# Patient Record
Sex: Male | Born: 1950 | Race: White | Hispanic: No | Smoking: Former smoker
Health system: Southern US, Community
[De-identification: ages and names within clinical notes are randomized; demographics above are authoritative.]

## PROBLEM LIST (undated history)

## (undated) DIAGNOSIS — N400 Enlarged prostate without lower urinary tract symptoms: Secondary | ICD-10-CM

## (undated) DIAGNOSIS — IMO0001 Reserved for inherently not codable concepts without codable children: Secondary | ICD-10-CM

## (undated) DIAGNOSIS — Z9221 Personal history of antineoplastic chemotherapy: Secondary | ICD-10-CM

## (undated) DIAGNOSIS — I10 Essential (primary) hypertension: Secondary | ICD-10-CM

## (undated) DIAGNOSIS — C300 Malignant neoplasm of nasal cavity: Secondary | ICD-10-CM

## (undated) DIAGNOSIS — E871 Hypo-osmolality and hyponatremia: Secondary | ICD-10-CM

## (undated) DIAGNOSIS — R1310 Dysphagia, unspecified: Secondary | ICD-10-CM

## (undated) DIAGNOSIS — C4490 Unspecified malignant neoplasm of skin, unspecified: Secondary | ICD-10-CM

## (undated) DIAGNOSIS — E43 Unspecified severe protein-calorie malnutrition: Secondary | ICD-10-CM

## (undated) DIAGNOSIS — C029 Malignant neoplasm of tongue, unspecified: Secondary | ICD-10-CM

## (undated) DIAGNOSIS — IMO0002 Reserved for concepts with insufficient information to code with codable children: Secondary | ICD-10-CM

## (undated) HISTORY — DX: Unspecified severe protein-calorie malnutrition: E43

## (undated) HISTORY — PX: SKIN BIOPSY: SHX1

## (undated) HISTORY — DX: Hypo-osmolality and hyponatremia: E87.1

## (undated) HISTORY — DX: Benign prostatic hyperplasia without lower urinary tract symptoms: N40.0

## (undated) HISTORY — DX: Dysphagia, unspecified: R13.10

## (undated) HISTORY — PX: OTHER SURGICAL HISTORY: SHX169

## (undated) HISTORY — DX: Malignant neoplasm of tongue, unspecified: C02.9

## (undated) HISTORY — DX: Malignant neoplasm of nasal cavity: C30.0

## (undated) HISTORY — PX: KNEE SURGERY: SHX244

---

## 2005-02-16 ENCOUNTER — Emergency Department: Payer: Self-pay | Admitting: Emergency Medicine

## 2006-04-05 ENCOUNTER — Emergency Department: Payer: Self-pay | Admitting: Emergency Medicine

## 2006-12-26 ENCOUNTER — Emergency Department: Payer: Self-pay | Admitting: Emergency Medicine

## 2007-01-17 ENCOUNTER — Ambulatory Visit: Payer: Self-pay | Admitting: Unknown Physician Specialty

## 2009-11-04 ENCOUNTER — Inpatient Hospital Stay: Payer: Self-pay | Admitting: Internal Medicine

## 2010-10-21 ENCOUNTER — Inpatient Hospital Stay (HOSPITAL_COMMUNITY)
Admission: EM | Admit: 2010-10-21 | Discharge: 2010-10-26 | DRG: 191 | Disposition: A | Discharge status: Home Health | Payer: Medicaid Other | Attending: Internal Medicine | Admitting: Internal Medicine

## 2010-10-21 DIAGNOSIS — F1020 Alcohol dependence, uncomplicated: Secondary | ICD-10-CM | POA: Diagnosis present

## 2010-10-21 DIAGNOSIS — F10939 Alcohol use, unspecified with withdrawal, unspecified: Secondary | ICD-10-CM | POA: Diagnosis present

## 2010-10-21 DIAGNOSIS — I10 Essential (primary) hypertension: Secondary | ICD-10-CM | POA: Diagnosis present

## 2010-10-21 DIAGNOSIS — B86 Scabies: Secondary | ICD-10-CM | POA: Diagnosis present

## 2010-10-21 DIAGNOSIS — J441 Chronic obstructive pulmonary disease with (acute) exacerbation: Principal | ICD-10-CM | POA: Diagnosis present

## 2010-10-21 DIAGNOSIS — E871 Hypo-osmolality and hyponatremia: Secondary | ICD-10-CM | POA: Diagnosis present

## 2010-10-21 DIAGNOSIS — E876 Hypokalemia: Secondary | ICD-10-CM | POA: Diagnosis not present

## 2010-10-21 DIAGNOSIS — F10239 Alcohol dependence with withdrawal, unspecified: Secondary | ICD-10-CM | POA: Diagnosis present

## 2010-10-22 ENCOUNTER — Emergency Department (HOSPITAL_COMMUNITY): Payer: Medicaid Other

## 2010-10-22 LAB — BASIC METABOLIC PANEL WITH GFR
BUN: 2 mg/dL — ABNORMAL LOW (ref 6–23)
CO2: 27 meq/L (ref 19–32)
Calcium: 8.8 mg/dL (ref 8.4–10.5)
Chloride: 92 meq/L — ABNORMAL LOW (ref 96–112)
Creatinine, Ser: 0.73 mg/dL (ref 0.4–1.5)
GFR calc non Af Amer: >60 mL/min
Glucose, Bld: 93 mg/dL (ref 70–99)
Potassium: 3.9 meq/L (ref 3.5–5.1)
Sodium: 130 meq/L — ABNORMAL LOW (ref 135–145)

## 2010-10-22 LAB — DIFFERENTIAL
Basophils Absolute: 0.1 K/uL (ref 0.0–0.1)
Basophils Relative: 1 % (ref 0–1)
Eosinophils Absolute: 0.8 K/uL — ABNORMAL HIGH (ref 0.0–0.7)
Eosinophils Relative: 12 % — ABNORMAL HIGH (ref 0–5)
Lymphocytes Relative: 37 % (ref 12–46)
Lymphs Abs: 2.4 K/uL (ref 0.7–4.0)
Monocytes Absolute: 0.7 K/uL (ref 0.1–1.0)
Monocytes Relative: 10 % (ref 3–12)
Neutro Abs: 2.5 K/uL (ref 1.7–7.7)
Neutrophils Relative %: 40 % — ABNORMAL LOW (ref 43–77)

## 2010-10-22 LAB — RAPID URINE DRUG SCREEN, HOSP PERFORMED
Amphetamines: NOT DETECTED
Barbiturates: NOT DETECTED
Benzodiazepines: NOT DETECTED
Cocaine: NOT DETECTED
Opiates: NOT DETECTED
Tetrahydrocannabinol: NOT DETECTED

## 2010-10-22 LAB — HEPATIC FUNCTION PANEL
ALT: 209 U/L — ABNORMAL HIGH (ref 0–53)
AST: 181 U/L — ABNORMAL HIGH (ref 0–37)
Bilirubin, Direct: 0.1 mg/dL (ref 0.0–0.3)
Total Protein: 7.4 g/dL (ref 6.0–8.3)

## 2010-10-22 LAB — CBC
HCT: 47 % (ref 39.0–52.0)
MCHC: 36 g/dL (ref 30.0–36.0)
Platelets: 190 10*3/uL (ref 150–400)
RDW: 13.7 % (ref 11.5–15.5)

## 2010-10-22 LAB — ETHANOL

## 2010-10-23 LAB — COMPREHENSIVE METABOLIC PANEL
Alkaline Phosphatase: 98 U/L (ref 39–117)
BUN: 3 mg/dL — ABNORMAL LOW (ref 6–23)
CO2: 24 mEq/L (ref 19–32)
Chloride: 92 mEq/L — ABNORMAL LOW (ref 96–112)
Glucose, Bld: 173 mg/dL — ABNORMAL HIGH (ref 70–99)
Potassium: 3.4 mEq/L — ABNORMAL LOW (ref 3.5–5.1)
Total Bilirubin: 0.6 mg/dL (ref 0.3–1.2)

## 2010-10-23 LAB — CBC
HCT: 43.4 % (ref 39.0–52.0)
MCV: 88.6 fL (ref 78.0–100.0)
RBC: 4.9 MIL/uL (ref 4.22–5.81)
WBC: 3.6 10*3/uL — ABNORMAL LOW (ref 4.0–10.5)

## 2010-10-23 LAB — TSH: TSH: 0.459 u[IU]/mL (ref 0.350–4.500)

## 2010-10-24 ENCOUNTER — Inpatient Hospital Stay (HOSPITAL_COMMUNITY): Payer: Medicaid Other

## 2010-10-24 ENCOUNTER — Other Ambulatory Visit (HOSPITAL_COMMUNITY): Payer: Medicaid Other

## 2010-10-24 LAB — BASIC METABOLIC PANEL
BUN: 11 mg/dL (ref 6–23)
CO2: 24 mEq/L (ref 19–32)
Calcium: 8.8 mg/dL (ref 8.4–10.5)
Chloride: 93 mEq/L — ABNORMAL LOW (ref 96–112)
Creatinine, Ser: 0.63 mg/dL (ref 0.4–1.5)
Creatinine, Ser: 0.76 mg/dL (ref 0.4–1.5)
GFR calc Af Amer: >60 mL/min (ref >60)
GFR calc Af Amer: >60 mL/min (ref >60)
GFR calc non Af Amer: >60 mL/min (ref >60)
Potassium: 3.7 mEq/L (ref 3.5–5.1)

## 2010-10-24 LAB — HEPATITIS PANEL, ACUTE
Hep B C IgM: NEGATIVE
Hepatitis B Surface Ag: NEGATIVE

## 2010-10-24 LAB — CBC
MCH: 31 pg (ref 26.0–34.0)
Platelets: 187 10*3/uL (ref 150–400)
RBC: 4.81 MIL/uL (ref 4.22–5.81)
RDW: 13.4 % (ref 11.5–15.5)
WBC: 5.3 10*3/uL (ref 4.0–10.5)

## 2010-10-24 LAB — DIFFERENTIAL
Basophils Relative: 0 % (ref 0–1)
Eosinophils Absolute: 0 10*3/uL (ref 0.0–0.7)
Neutrophils Relative %: 88 % — ABNORMAL HIGH (ref 43–77)

## 2010-10-25 ENCOUNTER — Inpatient Hospital Stay (HOSPITAL_COMMUNITY): Payer: Medicaid Other

## 2010-10-25 ENCOUNTER — Encounter (HOSPITAL_COMMUNITY): Payer: Self-pay | Admitting: Radiology

## 2010-10-25 LAB — CBC
MCH: 30.9 pg (ref 26.0–34.0)
MCHC: 34.7 g/dL (ref 30.0–36.0)
Platelets: 201 10*3/uL (ref 150–400)
RDW: 13.4 % (ref 11.5–15.5)

## 2010-10-25 LAB — COMPREHENSIVE METABOLIC PANEL
ALT: 118 U/L — ABNORMAL HIGH (ref 0–53)
AST: 61 U/L — ABNORMAL HIGH (ref 0–37)
CO2: 24 mEq/L (ref 19–32)
Chloride: 94 mEq/L — ABNORMAL LOW (ref 96–112)
Creatinine, Ser: 0.75 mg/dL (ref 0.4–1.5)
GFR calc Af Amer: >60 mL/min (ref >60)
GFR calc non Af Amer: >60 mL/min (ref >60)
Glucose, Bld: 126 mg/dL — ABNORMAL HIGH (ref 70–99)
Total Bilirubin: 1 mg/dL (ref 0.3–1.2)

## 2010-10-25 LAB — DIFFERENTIAL
Basophils Absolute: 0 10*3/uL (ref 0.0–0.1)
Basophils Relative: 0 % (ref 0–1)
Eosinophils Absolute: 0 10*3/uL (ref 0.0–0.7)
Eosinophils Relative: 0 % (ref 0–5)
Monocytes Absolute: 0.6 10*3/uL (ref 0.1–1.0)
Monocytes Relative: 10 % (ref 3–12)
Neutro Abs: 5.2 10*3/uL (ref 1.7–7.7)

## 2010-10-25 LAB — AMYLASE: Amylase: 40 U/L (ref 0–105)

## 2010-10-25 LAB — CARDIAC PANEL(CRET KIN+CKTOT+MB+TROPI): Relative Index: INVALID (ref 0.0–2.5)

## 2010-10-25 LAB — LIPASE, BLOOD: Lipase: 29 U/L (ref 11–59)

## 2010-10-25 MED ORDER — IOHEXOL 300 MG/ML  SOLN
100.0000 mL | Freq: Once | INTRAMUSCULAR | Status: AC | PRN
  Administered 2010-10-25: 100 mL via INTRAVENOUS

## 2010-10-26 DIAGNOSIS — R1013 Epigastric pain: Secondary | ICD-10-CM

## 2010-10-26 DIAGNOSIS — R110 Nausea: Secondary | ICD-10-CM

## 2010-10-26 LAB — CARDIAC PANEL(CRET KIN+CKTOT+MB+TROPI)
CK, MB: 0.6 ng/mL (ref 0.3–4.0)
CK, MB: 0.7 ng/mL (ref 0.3–4.0)
Relative Index: INVALID (ref 0.0–2.5)
Total CK: 21 U/L (ref 7–232)
Total CK: 21 U/L (ref 7–232)
Troponin I: 0.01 ng/mL (ref 0.00–0.06)

## 2010-10-26 LAB — DIFFERENTIAL
Basophils Absolute: 0 10*3/uL (ref 0.0–0.1)
Basophils Relative: 0 % (ref 0–1)
Neutro Abs: 5.5 10*3/uL (ref 1.7–7.7)
Neutrophils Relative %: 81 % — ABNORMAL HIGH (ref 43–77)

## 2010-10-26 LAB — OSMOLALITY: Osmolality: 262 mOsm/kg — ABNORMAL LOW (ref 275–300)

## 2010-10-26 LAB — CBC
Hemoglobin: 15.1 g/dL (ref 13.0–17.0)
RBC: 4.91 MIL/uL (ref 4.22–5.81)
WBC: 6.7 10*3/uL (ref 4.0–10.5)

## 2010-10-26 LAB — BASIC METABOLIC PANEL
Chloride: 97 mEq/L (ref 96–112)
GFR calc Af Amer: >60 mL/min (ref >60)
Potassium: 4 mEq/L (ref 3.5–5.1)
Sodium: 127 mEq/L — ABNORMAL LOW (ref 135–145)

## 2010-10-26 LAB — LACTIC ACID, PLASMA: Lactic Acid, Venous: 1.7 mmol/L (ref 0.5–2.2)

## 2010-10-26 LAB — HEPATIC FUNCTION PANEL
ALT: 120 U/L — ABNORMAL HIGH (ref 0–53)
AST: 72 U/L — ABNORMAL HIGH (ref 0–37)
Albumin: 3.1 g/dL — ABNORMAL LOW (ref 3.5–5.2)
Alkaline Phosphatase: 79 U/L (ref 39–117)
Total Protein: 5.9 g/dL — ABNORMAL LOW (ref 6.0–8.3)

## 2010-10-27 ENCOUNTER — Ambulatory Visit (HOSPITAL_COMMUNITY): Admission: RE | Admit: 2010-10-27 | Payer: Medicaid Other | Source: Ambulatory Visit | Admitting: Internal Medicine

## 2010-10-27 NOTE — Discharge Summary (Signed)
NAME:  Thomas [name], Thomas [name]                 ACCOUNT NO.:  [id number]  MEDICAL RECORD NO.:  [id number]           PATIENT TYPE:  I  LOCATION:  A213                          FACILITY:  [facility]  PHYSICIAN:  Wilson Singer, M.D.DATE OF BIRTH:  01-08-1951  DATE OF ADMISSION:  10/21/2010 DATE OF DISCHARGE:  03/21/2012LH                              DISCHARGE SUMMARY   FINAL DISCHARGE DIAGNOSES: 1. Alcohol withdrawal. 2. Alcohol dependence. 3. Chronic obstructive pulmonary disease with exacerbation. 4. Hypertension.  MEDICATIONS ON DISCHARGE: 1. Clonazepam 1 mg t.i.d. p.r.n. 2. Colace 100 mg b.i.d. p.r.n. 3. Folic acid 1 mg daily. 4. Metoprolol 50 mg b.i.d. 5. Avelox 400 mg daily for 7 days. 6. Multivitamins 1 tablet daily. 7. Nicotine patch 21 mg per day daily. 8. Prednisone 20 mg daily for 1 week and then stop. 9. Protonix 40 mg daily. 10.Spiriva inhaler 18 mcg 1 inhalation daily. 11.Thiamine 100 mg daily. 12.Effexor 150 mg b.i.d. CONDITION ON DISCHARGE:  Stable.  HISTORY:  This 60 year old man was admitted with dyspnea and history of alcohol abuse.  Dyspnea was associated with coughing and productive sputum.  Please see initial history and physical examination done by Dr. Eda Paschal.  HOSPITAL PROGRESS:  The patient was felt to have an exacerbation of COPD based on longstanding smoking history.  He also is alcoholic and he requested help with this.  He underwent CT scan of his abdomen in view of abdominal pain, nausea and vomiting.  The CT scan showed a fatty liver and some gaseous distention of the ascending, transverse colon without evidence of obstruction lesion.  There was inability to see the rectal wall where there was a suggestion of thickening but this may need to be investigated further with colonoscopy.  He has been seen by Gastroenterology who feel that an outpatient colonoscopy and possibly even EGD may be appropriate.  The patient seemed to improve during  his hospitalization.  The patient also had some sort of possibly scabies rash which is improved and this had been present for 4 months.  The patient had been rather noncompliant with medications and he was meant to be on several medications and he has taken 9 in the last few months.  PHYSICAL EXAMINATION:  Today; GENERAL:  He actually feels well.  He was put on alcohol withdrawal protocol and this seems to have helped him.  He is not particularly shaky.  He does complain of cough productive brownish sputum. VITAL SIGNS:  Temperature 97.4, blood pressure 166/94, pulse 80, saturation 93% on room air. CARDIAC:  Heart sounds are present and normal. LUNGS:  Lung fields are clear with occasional wheezing bilaterally. ABDOMEN:  Soft and nontender with no hepatosplenomegaly.  In particular, there are no signs of chronic liver disease.  Investigations today show a sodium of 127 and this is stable, potassium 4.0, bicarbonate 23, BUN 12, creatinine 0.71, his albumin is 3.1.  His ALT is rated at 120 and AST at 72 and this is stable, also alk phos is in the normal range of 79.  Hepatitis A, B and C panel were done and this was negative.  A  lipase was not elevated nor was a lactic acid. When he came into the hospital, his alcohol level was 295.  DISPOSITION:  I am prescribing him several medications as noted above to hopefully keep him out of the hospital.  He unfortunately does not qualify at this moment in time for a rehab place for alcoholism due to his inability to walk upstairs.  Therefore, he is going to go home with home health physical therapy and aide and also social worker help.  I have told him to follow up with primary care physician and also gastroenterologist seen him during this hospitalization and he needs to follow up with them with a view to a possible colonoscopy.     Wilson Singer, M.D.     NCG/MEDQ  D:  10/26/2010  T:  10/26/2010  Job:  [id number]  cc:   R. Roetta Sessions, M.D. [address] [place] [address]  Electronically Signed by Lilly Cove M.D. on 10/27/2010 05:07:27 PM

## 2010-11-02 ENCOUNTER — Emergency Department (HOSPITAL_COMMUNITY): Payer: Medicaid Other

## 2010-11-02 ENCOUNTER — Emergency Department (HOSPITAL_COMMUNITY)
Admission: EM | Admit: 2010-11-02 | Discharge: 2010-11-02 | Disposition: A | Discharge status: Home / Self Care | Payer: Medicaid Other | Attending: Emergency Medicine | Admitting: Emergency Medicine

## 2010-11-02 DIAGNOSIS — I10 Essential (primary) hypertension: Secondary | ICD-10-CM | POA: Insufficient documentation

## 2010-11-02 DIAGNOSIS — J4489 Other specified chronic obstructive pulmonary disease: Secondary | ICD-10-CM | POA: Insufficient documentation

## 2010-11-02 DIAGNOSIS — M722 Plantar fascial fibromatosis: Secondary | ICD-10-CM | POA: Insufficient documentation

## 2010-11-02 DIAGNOSIS — B86 Scabies: Secondary | ICD-10-CM | POA: Insufficient documentation

## 2010-11-02 DIAGNOSIS — M79609 Pain in unspecified limb: Secondary | ICD-10-CM | POA: Insufficient documentation

## 2010-11-02 DIAGNOSIS — J449 Chronic obstructive pulmonary disease, unspecified: Secondary | ICD-10-CM | POA: Insufficient documentation

## 2010-11-02 DIAGNOSIS — Z79899 Other long term (current) drug therapy: Secondary | ICD-10-CM | POA: Insufficient documentation

## 2010-11-03 ENCOUNTER — Inpatient Hospital Stay: Payer: Medicaid Other | Admitting: Gastroenterology

## 2010-11-25 NOTE — H&P (Signed)
NAME:  Thomas [name], Thomas [name] NO.:  [id number]  MEDICAL RECORD NO.:  [id number]           PATIENT TYPE:  LOCATION:                                 FACILITY:  PHYSICIAN:  Arlon Bleier Bosie Helper, MD      DATE OF BIRTH:  1951/02/23  DATE OF ADMISSION: DATE OF DISCHARGE:  LH                             HISTORY & PHYSICAL   PRIMARY CARE PROVIDER:  Unassigned.  CHIEF COMPLAINT:  Abnormal rash for the last 4 months and needs help with alcohol abuse and shortness of breath with coughing.  HISTORY OF PRESENT ILLNESS:  This is a 61 year old Caucasian gentleman with history of ongoing tobacco abuse and history of alcohol abuse.  He admitted he recently was [facility] from the prison on August 04, 2010.  He complained of abnormal rash, which started on his lower extremity and extended to his upper trunk and then back, also extended to his upper extremities.  It is extremely itchy.  He was being treated with some medication at the prison, the patient cannot recall the name of the medication.  Also, the patient admitted he has continued to drink alcohol on daily basis, last drink was yesterday.  He would like to try quitting.  Also the patient is complaining of shortness of breath, but admitted he has baseline shortness of breath and sometimes coughing. Coughing is nonproductive.  The patient denies any fever.  The patient admitted he was brought by his daughter because of her concern about the patient's withdrawal  and drinking habit.  Also, daughter was concerned about the patient's shortness of breath.  The patient denies any fever. The patient denies any chest pain, denies any back pain, denies any headache, denies any numbness or weakness of his extremities.  The patient denies any nausea or vomiting.  Last bowel movement was yesterday.  The patient denies any blood in stool, denies any hematemesis.  MEDICATIONS:  The patient admitted using Klonopin, Effexor, nebs treatment,  Spiriva.  PAST SURGICAL HISTORY:  History of knee surgeries.  ALLERGIES:  No known drug allergies.  SOCIAL HISTORY:  The patient admitted he smoked cigarettes one and half pack a day for more than 50 years.  Also, he drinks bear about 24 case every day, last drink was yesterday.  He denies any other illicit drug abuse.  FAMILY HISTORY:  The patient admitted his father was alcoholic and a smoker.  He died with cancer complication.  PHYSICAL EXAMINATION:  VITAL SIGNS:  Temperature 98.4, blood pressure 178/97, pulse rate 95, respiratory rate 18, saturating 92% on room air. GENERAL:  The patient is wheezing, but not in respiratory distress. HEENT:  Pupils equal and reactive to light and accommodation. Extraocular muscle movement within normal.  Mucosa moist.  No rash.  No tonsillar hypertrophy. NECK:  Supple.  No lymphadenopathy.  No masses. LUNGS:  Bilateral rales.  Bilateral wheezes.  No bronchial breathing. HEART:  S1 and S2 with no added sound. ABDOMEN:  Soft, nontender.  Bowel sound positive.  Inguinal area, there is no lymphadenopathy.  No discharges. EXTREMITIES:  No lower leg edema.  Peripheral pulses  intact. SKIN:  There is a small erythematous, nondiscrete papule with some hemorrhagic crust.  There is a scratch mark on both flexor and extensor surface of both upper and lower extremities, also on his abdomen, a less benign on the back.  No ecchymoses.  Less amount on the plantar surface of his legs and palm. CENTRAL NERVOUS SYSTEM:  The patient is awake, alert, and oriented.  The patient is really actively shaking.  BLOOD WORKUP:  CBC 6.4, hemoglobin 16.9, hematocrit 47, platelet 190, alcohol level was 295.  BMET:  Sodium 139, potassium 3.9, chloride 92, CO2 27, glucose 93, BUN 2, creatinine 0.73, calcium 8.8, ALT 209, AST 181, alkaline phosphatase 113, magnesium 2.2, phosphorus 4.  Chest x-ray chronic changes.  ASSESSMENT AND PLAN: 1. Alcohol withdrawal with alcohol  dependence. 2. Skin rash, possibility of scabies as the patient was recently     discharged from the prison. 3. Chronic obstructive pulmonary disease with exacerbation. We will admit to regular floor.  We will start the patient on some Solu- Medrol 60 mg IV b.i.d. and Avelox 400 mg p.o. daily.  We will start the patient on albuterol nebs and Atrovent.  We will place the patient on contact isolation.  Permethrin 5% cream will be applied to the patient's skin.  We will provide the patient with Benadryl.  We will get hepatitis profile.  We will continue on alcohol withdrawal protocol.  We will involve Child psychotherapist.  Counseling will be addressed during hospital stay.  Further recommendation as hospital course progresses.     Guhan Bruington Bosie Helper, MD     HIE/MEDQ  D:  10/22/2010  T:  10/23/2010  Job:  [id number]  Electronically Signed by Ebony Cargo MD on 11/25/2010 05:29:08 AM

## 2010-11-27 NOTE — Consult Note (Signed)
NAME:  Thomas [name], Thomas [name] NO.:  [id number]  MEDICAL RECORD NO.:  [id number]           PATIENT TYPE:  I  LOCATION:  A213                          FACILITY:  [facility]  PHYSICIAN:  Gerrit Halls, ANP-BC      DATE OF BIRTH:  05-23-1951  DATE OF CONSULTATION:  10/26/2010 DATE OF DISCHARGE:                                CONSULTATION   PRIMARY CARE PHYSICIAN:  None.  GASTROENTEROLOGIST:  Jonathon Bellows, MD  REASON FOR CONSULTATION:  Abdominal pain.  HISTORY OF PRESENT ILLNESS:  Mr. Beutler is a 60 year old Caucasian male who was admitted with a chief complaint of an abnormal rash for the last 4 months as well as the need for assistance with alcohol abuse and shortness of breath.  He does have a history of COPD.  Mr. Andal was admitted on October 21, 2010 with concerns regarding a rash had been present for the past 4 months.  He has a history of alcohol abuse.  He was drinking upwards of 12+ beers a day intermittently quite his whole life.  His daughter actually presented with him concerned about the patient shortness of breath as well as his alcohol use and drinking habits.  He was admitted and while admitted, we were consulted for abdominal pain.  The patient reports that he has experienced nausea and epigastric pain for approximately 1 year, this was worse with eating.  It is intermittent in nature.  It is described as burning in 8/9.  He denies any dysphagia or odynophagia.  He denies any NSAIDs, PCs, or Goody's.  He does have reflux with taking Tums everyday.  He is not on a PPI currently.  He does complain of lower abdominal cramping; however, this is relieved after a bowel movement.  Prior to admission, he had a bowel movement approximately every other day.  This pain only lasts 2-3 minutes at a time.  He denies any melena or bright red blood per rectum.  His last colonoscopy and upper endoscopy was done approximately 4 years ago in [place].  We do not have these  records currently.  As of note, he was just discharged from prison on August 04, 2010.  He was incarcerated secondary to driving under the influence, he was there approximately 10 months.  PAST MEDICAL HISTORY:  Significant for COPD, alcohol abuse and depression as well as reflux.  PAST SURGICAL HISTORY:  Knee surgeries x3.  SOCIAL HISTORY:  Alcohol abuse.  He also smokes one pack per day.  He denies the use of any illicit drugs.  He has been married two times previously.  He is now divorced.  He has one daughter.  He was recently discharged from prison on August 04, 2009.  FAMILY HISTORY:  Emelia Loron actually has a history of colon cancer and was deceased at 89 secondary to this.  His father was deceased with some type of cancer as well as a history of alcohol abuse.  His mom is living and has COPD.  CURRENT ALLERGIES:  No known drug allergies.  MEDICATIONS PRIOR TO ADMISSION:  Klonopin, Effexor nebulizer treatments, Spiriva.  MEDICATIONS  FOR THIS ADMISSION:  Albuterol, Norvasc, aspirin, Dulcolax suppository, clonidine, Benadryl 300 mg a day, folic acid, Lasix, heparin, Atrovent, Ativan, Avelox, multivitamin, nicotine patch, Effexor.  REVIEW OF SYSTEMS:  Negative in detail except as mentioned in the history of present illness.  PHYSICAL EXAMINATION:  GENERAL:  He is in no apparent distress.  He does seem quite drowsy, but he is easily arousable.  He is somewhat of a poor historian. HEENT:  Sclerae without icterus.  No thyromegaly noted. CARDIAC:  Regular rate and rhythm. LUNGS:  Coarse bilaterally, but no use of accessory muscles.  No labored breathing. ABDOMEN:  Soft, nontender, nondistended.  Positive bowel sounds.  No rebound or guarding.  No splenomegaly noted. EXTREMITIES:  Without edema. SKIN:  Without any significant lesions.  LABORATORY DATA:  Pertinent blood work for this admission, most recent CBC with an H and H of 15.1 and 43.9, white blood count normal  at 6.7 and platelets are 202.  Basic metabolic panel with sodium 127, glucose of 150, potassium is normal at 4.  Hepatic function panel, T bili is 0.7 and direct bilirubin 0.2, direct 0.5, alk phos is normal at 79, AST is mildly elevated at 72, ALT is mildly elevated 120, total protein 5.9, albumin 3.1.  Viral markers are all negative.  Amylase is normal as well as lipase.  All of the cardiac enzymes have been negative thus far. Pertinent radiology procedures and ultrasound was performed October 24, 2010 to assess for ascites or cirrhosis.  There was increased echogenicity of the liver.  No definite evidence of cirrhosis.  There is trace perihepatic ascites.  Abdominal x-ray, 1-view showed mild transverse colonic dilatation favor adynamic ileus.  The CT performed on October 25, 2010 showed gaseous distention of ascending transverse colon without definite evidence of obstructive lesion, question of colonic ileus, unable to exclude rectal wall thickening.  All that can be under distention artifact, recommend correlation of colonoscopy to include mass of fatty liver.  ASSESSMENT AND PLAN:  Mr. Mcmanamon is a 60 year old man with a history of significant alcohol abuse and was actually just recently discharged from prison secondary to driving under the influence.  He reports at least year long history of nausea and epigastric pain that is worsened with eating.  He does have mild elevated transaminases likely secondary to his chronic alcohol use.  Differentials include gastritis, peptic ulcer disease, alcohol induced gastritis.  He would likely benefit from an inpatient upper endoscopy that would need to be performed in the operative room secondary to his alcohol abuse.  PLAN: 1. Protonix 40 mg oral daily. 2. Obtain reports from outside facility regarding endoscopy and     colonoscopy done approximately 4 years ago in [place].  MiraLax 17     g daily as needed for constipation.  Plan on upper  endoscopy on     October 27, 2010 in the operating room secondary to chronic use of     alcohol. 3. We would highly recommend outpatient colonoscopy for this     individual to further assess his lower GI tract; however, he is not     having any melena or bright red blood per rectum.  We would like to thank the referring team for this referral and we will continue to follow the patient with you.          ______________________________ Gerrit Halls, ANP-BC     AS/MEDQ  D:  10/26/2010  T:  10/26/2010  Job:  [id number]  Electronically Signed by  ANNA SAMS  on 11/01/2010 11:33:53 AM Electronically Signed by Lorrin Goodell M.D. on 11/27/2010 02:08:00 PM

## 2011-08-28 ENCOUNTER — Emergency Department: Payer: Self-pay | Admitting: *Deleted

## 2011-08-28 LAB — COMPREHENSIVE METABOLIC PANEL
Anion Gap: 12 (ref 7–16)
Bilirubin,Total: 0.5 mg/dL (ref 0.2–1.0)
Calcium, Total: 8.3 mg/dL — ABNORMAL LOW (ref 8.5–10.1)
Co2: 26 mmol/L (ref 21–32)
Creatinine: 0.74 mg/dL (ref 0.60–1.30)
EGFR (African American): >60
EGFR (Non-African Amer.): >60
Glucose: 104 mg/dL — ABNORMAL HIGH (ref 65–99)
SGPT (ALT): 96 U/L — ABNORMAL HIGH
Sodium: 131 mmol/L — ABNORMAL LOW (ref 136–145)
Total Protein: 8 g/dL (ref 6.4–8.2)

## 2011-08-28 LAB — CBC
HCT: 48.1 % (ref 40.0–52.0)
MCHC: 34.2 g/dL (ref 32.0–36.0)
RDW: 13.7 % (ref 11.5–14.5)

## 2011-08-28 LAB — ETHANOL: Ethanol: 386 mg/dL

## 2011-08-28 LAB — TSH: Thyroid Stimulating Horm: 0.93 u[IU]/mL

## 2011-08-28 LAB — DRUG SCREEN, URINE
Amphetamines, Ur Screen: NEGATIVE (ref ?–1000)
Benzodiazepine, Ur Scrn: NEGATIVE (ref ?–200)
MDMA (Ecstasy)Ur Screen: NEGATIVE (ref ?–500)
Methadone, Ur Screen: NEGATIVE (ref ?–300)
Opiate, Ur Screen: NEGATIVE (ref ?–300)
Phencyclidine (PCP) Ur S: NEGATIVE (ref ?–25)

## 2011-08-28 LAB — SALICYLATE LEVEL: Salicylates, Serum: 4.2 mg/dL — ABNORMAL HIGH

## 2011-08-29 LAB — ETHANOL
Ethanol %: 0.124 % — ABNORMAL HIGH (ref 0.000–0.080)
Ethanol %: 0.045 % (ref 0.000–0.080)
Ethanol: 45 mg/dL

## 2013-02-19 ENCOUNTER — Inpatient Hospital Stay: Payer: Self-pay | Admitting: Specialist

## 2013-02-19 LAB — COMPREHENSIVE METABOLIC PANEL
Anion Gap: 18 — ABNORMAL HIGH (ref 7–16)
BUN: 8 mg/dL (ref 7–18)
Chloride: 88 mmol/L — ABNORMAL LOW (ref 98–107)
EGFR (African American): >60
EGFR (Non-African Amer.): >60
Osmolality: 250 (ref 275–301)
Potassium: 3.4 mmol/L — ABNORMAL LOW (ref 3.5–5.1)
SGPT (ALT): 55 U/L (ref 12–78)
Sodium: 123 mmol/L — ABNORMAL LOW (ref 136–145)
Total Protein: 7.4 g/dL (ref 6.4–8.2)

## 2013-02-19 LAB — CBC WITH DIFFERENTIAL/PLATELET
Basophil %: 0.2 %
Eosinophil #: 0 10*3/uL (ref 0.0–0.7)
Eosinophil %: 0.1 %
Lymphocyte #: 1.6 10*3/uL (ref 1.0–3.6)
Lymphocyte %: 14.9 %
MCHC: 35.2 g/dL (ref 32.0–36.0)
Neutrophil #: 7.9 10*3/uL — ABNORMAL HIGH (ref 1.4–6.5)
Neutrophil %: 76.1 %
RBC: 4.64 10*6/uL (ref 4.40–5.90)
RDW: 13.3 % (ref 11.5–14.5)
WBC: 10.4 10*3/uL (ref 3.8–10.6)

## 2013-02-19 LAB — DRUG SCREEN, URINE
Amphetamines, Ur Screen: NEGATIVE (ref ?–1000)
Barbiturates, Ur Screen: NEGATIVE (ref ?–200)
Benzodiazepine, Ur Scrn: NEGATIVE (ref ?–200)
Cocaine Metabolite,Ur ~~LOC~~: NEGATIVE (ref ?–300)
MDMA (Ecstasy)Ur Screen: NEGATIVE (ref ?–500)
Methadone, Ur Screen: NEGATIVE (ref ?–300)

## 2013-02-19 LAB — URINALYSIS, COMPLETE
Bilirubin,UR: NEGATIVE
Nitrite: NEGATIVE
Ph: 5 (ref 4.5–8.0)
Protein: 100
Squamous Epithelial: <1
WBC UR: 1 /HPF (ref 0–5)

## 2013-02-19 LAB — ETHANOL
Ethanol %: <0.003 % (ref 0.000–0.080)
Ethanol: <3 mg/dL

## 2013-02-20 LAB — CBC WITH DIFFERENTIAL/PLATELET
Basophil #: 0 10*3/uL (ref 0.0–0.1)
Basophil %: 0.3 %
Eosinophil #: 0.1 10*3/uL (ref 0.0–0.7)
HCT: 38.7 % — ABNORMAL LOW (ref 40.0–52.0)
HGB: 13.4 g/dL (ref 13.0–18.0)
Lymphocyte #: 1.9 10*3/uL (ref 1.0–3.6)
Lymphocyte %: 29.1 %
MCHC: 34.7 g/dL (ref 32.0–36.0)
Neutrophil #: 3.9 10*3/uL (ref 1.4–6.5)
Neutrophil %: 61.1 %
Platelet: 142 10*3/uL — ABNORMAL LOW (ref 150–440)
RBC: 4.18 10*6/uL — ABNORMAL LOW (ref 4.40–5.90)
RDW: 13.6 % (ref 11.5–14.5)

## 2013-02-20 LAB — BASIC METABOLIC PANEL
BUN: 3 mg/dL — ABNORMAL LOW (ref 7–18)
Calcium, Total: 8.1 mg/dL — ABNORMAL LOW (ref 8.5–10.1)
Chloride: 103 mmol/L (ref 98–107)
Co2: 25 mmol/L (ref 21–32)
EGFR (African American): >60
EGFR (Non-African Amer.): >60
Glucose: 88 mg/dL (ref 65–99)
Osmolality: 266 (ref 275–301)
Potassium: 3.5 mmol/L (ref 3.5–5.1)
Sodium: 135 mmol/L — ABNORMAL LOW (ref 136–145)

## 2013-02-22 LAB — MAGNESIUM: Magnesium: 2.1 mg/dL

## 2013-02-22 LAB — BASIC METABOLIC PANEL
BUN: 4 mg/dL — ABNORMAL LOW (ref 7–18)
Chloride: 105 mmol/L (ref 98–107)
Creatinine: 0.91 mg/dL (ref 0.60–1.30)
EGFR (Non-African Amer.): >60
Glucose: 111 mg/dL — ABNORMAL HIGH (ref 65–99)
Sodium: 137 mmol/L (ref 136–145)

## 2013-04-10 ENCOUNTER — Emergency Department: Payer: Self-pay | Admitting: Emergency Medicine

## 2013-05-21 ENCOUNTER — Ambulatory Visit: Payer: Self-pay | Admitting: Otolaryngology

## 2014-06-02 ENCOUNTER — Encounter (HOSPITAL_COMMUNITY): Payer: Self-pay | Admitting: Emergency Medicine

## 2014-06-02 ENCOUNTER — Emergency Department (HOSPITAL_COMMUNITY)
Admission: EM | Admit: 2014-06-02 | Discharge: 2014-06-02 | Disposition: A | Discharge status: Home / Self Care | Payer: Medicaid Other | Attending: Emergency Medicine | Admitting: Emergency Medicine

## 2014-06-02 DIAGNOSIS — Z720 Tobacco use: Secondary | ICD-10-CM | POA: Diagnosis not present

## 2014-06-02 DIAGNOSIS — Z87448 Personal history of other diseases of urinary system: Secondary | ICD-10-CM | POA: Insufficient documentation

## 2014-06-02 DIAGNOSIS — I10 Essential (primary) hypertension: Secondary | ICD-10-CM | POA: Diagnosis not present

## 2014-06-02 DIAGNOSIS — Z9221 Personal history of antineoplastic chemotherapy: Secondary | ICD-10-CM | POA: Diagnosis not present

## 2014-06-02 DIAGNOSIS — R630 Anorexia: Secondary | ICD-10-CM | POA: Diagnosis present

## 2014-06-02 DIAGNOSIS — E871 Hypo-osmolality and hyponatremia: Secondary | ICD-10-CM

## 2014-06-02 DIAGNOSIS — C029 Malignant neoplasm of tongue, unspecified: Secondary | ICD-10-CM | POA: Diagnosis not present

## 2014-06-02 HISTORY — DX: Reserved for concepts with insufficient information to code with codable children: IMO0002

## 2014-06-02 HISTORY — DX: Essential (primary) hypertension: I10

## 2014-06-02 HISTORY — DX: Benign prostatic hyperplasia without lower urinary tract symptoms: N40.0

## 2014-06-02 HISTORY — DX: Personal history of antineoplastic chemotherapy: Z92.21

## 2014-06-02 HISTORY — DX: Malignant neoplasm of tongue, unspecified: C02.9

## 2014-06-02 HISTORY — DX: Reserved for inherently not codable concepts without codable children: IMO0001

## 2014-06-02 LAB — BASIC METABOLIC PANEL
ANION GAP: 9 (ref 5–15)
BUN: 8 mg/dL (ref 6–23)
CALCIUM: 8.9 mg/dL (ref 8.4–10.5)
CHLORIDE: 88 meq/L — AB (ref 96–112)
CO2: 27 meq/L (ref 19–32)
CREATININE: 1.17 mg/dL (ref 0.50–1.35)
GFR calc Af Amer: 74 mL/min — ABNORMAL LOW (ref >90)
GFR calc non Af Amer: 64 mL/min — ABNORMAL LOW (ref >90)
Glucose, Bld: 98 mg/dL (ref 70–99)
Potassium: 4.4 mEq/L (ref 3.7–5.3)
Sodium: 124 mEq/L — ABNORMAL LOW (ref 137–147)

## 2014-06-02 LAB — URINALYSIS, ROUTINE W REFLEX MICROSCOPIC
BILIRUBIN URINE: NEGATIVE
Glucose, UA: NEGATIVE mg/dL
HGB URINE DIPSTICK: NEGATIVE
KETONES UR: NEGATIVE mg/dL
Leukocytes, UA: NEGATIVE
Nitrite: NEGATIVE
PH: 5.5 (ref 5.0–8.0)
Protein, ur: NEGATIVE mg/dL
Urobilinogen, UA: 0.2 mg/dL (ref 0.0–1.0)

## 2014-06-02 LAB — CBC
HEMATOCRIT: 27.6 % — AB (ref 39.0–52.0)
Hemoglobin: 9.5 g/dL — ABNORMAL LOW (ref 13.0–17.0)
MCH: 32.3 pg (ref 26.0–34.0)
MCHC: 34.4 g/dL (ref 30.0–36.0)
MCV: 93.9 fL (ref 78.0–100.0)
PLATELETS: 448 10*3/uL — AB (ref 150–400)
RBC: 2.94 MIL/uL — ABNORMAL LOW (ref 4.22–5.81)
RDW: 13.3 % (ref 11.5–15.5)
WBC: 5.2 10*3/uL (ref 4.0–10.5)

## 2014-06-02 MED ORDER — SODIUM CHLORIDE 0.9 % IV BOLUS (SEPSIS)
1000.0000 mL | Freq: Once | INTRAVENOUS | Status: AC
  Administered 2014-06-02: 1000 mL via INTRAVENOUS

## 2014-06-02 MED ORDER — SODIUM CHLORIDE 0.9 % IV BOLUS (SEPSIS)
500.0000 mL | Freq: Once | INTRAVENOUS | Status: AC
  Administered 2014-06-02: 500 mL via INTRAVENOUS

## 2014-06-02 NOTE — ED Provider Notes (Addendum)
CSN: [id number]     Arrival date & time 06/02/14  1859 History   First MD Initiated Contact with Patient 06/02/14 2054     No chief complaint on file.    (Consider location/radiation/quality/duration/timing/severity/associated sxs/prior Treatment) The history is provided by the patient.   patient has a history of tongue cancer. He's been on chemotherapy and radiation. Last finished chemotherapy around 6 months ago. Has been followed at [facility] and was found to be hyponatremic with a sodium of 127. Send in for IV fluids and evaluation and if required admission to [facility]. Patient states he has had a somewhat decreased appetite. He states food does not taste good to him. He is recently been on a fluid restriction. He also is a somewhat heavy drinker. He states he only drinks two 40oz beers a day now. He has previously had DTs. Prior lab work reviewed and over the last month his sodium has gone between 121 and 134. Over the last 20 days his sodium has been between 121 and 129.  Past Medical History  Diagnosis Date  . Hypertension   . Cancer of nasal cavities   . Enlarged prostate   . Tongue cancer   . Renal disorder   . Lung infection     no lung cancer  . Radiation     to neck  . Status post chemotherapy    Past Surgical History  Procedure Laterality Date  . Cancerous lymph nodes removed from neck    . Knee surgery Right    No family history on file. History  Substance Use Topics  . Smoking status: Heavy Tobacco Smoker    Types: Cigarettes  . Smokeless tobacco: Not on file  . Alcohol Use: Yes    Review of Systems  Constitutional: Positive for appetite change. Negative for activity change and fatigue.  Eyes: Negative for pain.  Respiratory: Negative for chest tightness and shortness of breath.   Cardiovascular: Negative for chest pain and leg swelling.  Gastrointestinal: Negative for nausea, vomiting, abdominal pain and diarrhea.  Genitourinary: Negative for flank pain.   Musculoskeletal: Negative for back pain and neck stiffness.  Skin: Negative for rash.  Neurological: Negative for weakness, numbness and headaches.  Psychiatric/Behavioral: Negative for behavioral problems.      Allergies  Review of patient's allergies indicates no known allergies.  Home Medications   Prior to Admission medications   Not on File   BP 125/55  Pulse 85  Temp(Src) 98.7 F (37.1 C) (Oral)  Resp 18  Ht 5\' 7"  (1.702 m)  Wt 121 lb 7 oz (55.084 kg)  BMI 19.02 kg/m2  SpO2 97% Physical Exam  Constitutional: He is oriented to person, place, and time. He appears well-developed.  HENT:  Nose is postsurgical  Eyes: Pupils are equal, round, and reactive to light.  Neck:  Postsurgical changes to right of neck  Cardiovascular: Normal rate and regular rhythm.   Pulmonary/Chest: Effort normal.  Abdominal: Soft. There is no tenderness.  Musculoskeletal: He exhibits no edema.  Neurological: He is alert and oriented to person, place, and time.  Skin: Skin is warm.    ED Course  Procedures (including critical care time) Labs Review Labs Reviewed  CBC  BASIC METABOLIC PANEL  URINALYSIS, ROUTINE W REFLEX MICROSCOPIC    Imaging Review No results found.   EKG Interpretation None      MDM   Final diagnoses:  None    Patient with hyponatremia. Has had a history of same. Recent  lab work from [facility] reviewed. Has been between 121 and 129 over the last 20 days. Reportedly 127 yesterday. Will recheck here and give IV fluids. There may be a component of fluid intake and also patient is a somewhat heavy beer drinker. He is overall well-appearing and will likely be able to be discharged home to follow-up the next day or 2.    Jasper Riling. Alvino Chapel, MD 06/02/14 2136  Patient's sodium is now 124. He has been chronically low. He is overall well-appearing. He has been given a liter and a half of fluid. A negative renal discharge home. He will call his  primary doctor, Dr. Iona Beard tomorrow. He will require a recheck soon. If she cannot do it or wants more urgent results he can always come back to the ER tomorrow. It does not look like he'll need admission overnight tonight.  Jasper Riling. Alvino Chapel, MD 06/02/14 2213

## 2014-06-02 NOTE — Discharge Instructions (Signed)
Hyponatremia  °Hyponatremia is when the amount of salt (sodium) in your blood is too low. When sodium levels are low, your cells will absorb extra water and swell. The swelling happens throughout the body, but it mostly affects the brain. Severe brain swelling (cerebral edema), seizures, or coma can happen.  °CAUSES  °· Heart, kidney, or liver problems. °· Thyroid problems. °· Adrenal gland problems. °· Severe vomiting and diarrhea. °· Certain medicines or illegal drugs. °· Dehydration. °· Drinking too much water. °· Low-sodium diet. °SYMPTOMS  °· Nausea and vomiting. °· Confusion. °· Lethargy. °· Agitation. °· Headache. °· Twitching or shaking (seizures). °· Unconsciousness. °· Appetite loss. °· Muscle weakness and cramping. °DIAGNOSIS  °Hyponatremia is identified by a simple blood test. Your caregiver will perform a history and physical exam to try to find the cause and type of hyponatremia. Other tests may be needed to measure the amount of sodium in your blood and urine. °TREATMENT  °Treatment will depend on the cause.  °· Fluids may be given through the vein (IV). °· Medicines may be used to correct the sodium imbalance. If medicines are causing the problem, they will need to be adjusted. °· Water or fluid intake may be restricted to restore proper balance. °The speed of correcting the sodium problem is very important. If the problem is corrected too fast, nerve damage (sometimes unchangeable) can happen. °HOME CARE INSTRUCTIONS  °· Only take medicines as directed by your caregiver. Many medicines can make hyponatremia worse. Discuss all your medicines with your caregiver. °· Carefully follow any recommended diet, including any fluid restrictions. °· You may be asked to repeat lab tests. Follow these directions. °· Avoid alcohol and recreational drugs. °SEEK MEDICAL CARE IF:  °· You develop worsening nausea, fatigue, headache, confusion, or weakness. °· Your original hyponatremia symptoms return. °· You have  problems following the recommended diet. °SEEK IMMEDIATE MEDICAL CARE IF:  °· You have a seizure. °· You faint. °· You have ongoing diarrhea or vomiting. °MAKE SURE YOU:  °· Understand these instructions. °· Will watch your condition. °· Will get help right away if you are not doing well or get worse. °Document Released: 07/14/2002 Document Revised: 10/16/2011 Document Reviewed: 01/08/2011 °[employer]® Patient Information ©2015 [employer]. This information is not intended to replace advice given to you by your health care provider. Make sure you discuss any questions you have with your health care provider. ° °

## 2014-06-02 NOTE — ED Notes (Signed)
He has been fighting cancer for the past year. Dr. Iona Beard is his primary care provider, he went in for blood work yesterday. His sodium level was 127. If he has to be admitted his doctors want him to go to [place]. They want him to get IV fluids to bring it up per daughter.

## 2014-11-27 NOTE — Consult Note (Signed)
PATIENT NAME:  Thomas [name], Thomas [name] MR#:  [id number] DATE OF BIRTH:  1951-01-08  DATE OF CONSULTATION:  02/19/2013  REFERRING PHYSICIAN: Dr. Verdell Carmine.   CONSULTING PHYSICIAN: Jill Poling, M.D.   REASON FOR CONSULTATION: History of epistaxis.   HISTORY OF PRESENT ILLNESS: This 64 year old male was admitted to the hospital today after a possible seizure. He has a history of alcohol abuse, drinking multiple beers daily. The last drink being this morning. Apparently, he has a history of intermittent bleeding from the left nostril over the past 3 to 4 years. He is generally able to stop these nosebleeds by hold pressure.  They often occur after he has scratched at his nose. He feels a scab inside of his nose which will come off and then rebleed. He has not had any bleeding today and it actually has been a number of days since he actually had any bleeding from his nose.   PAST MEDICAL HISTORY: 1. Alcohol abuse.  2. History of alcohol withdrawal seizures.  3. Tobacco abuse.  4. History of chronic obstructive pulmonary disease.  5. Hypertension.   ALLERGIES: None.   SOCIAL HISTORY: Smokes a pack a day of cigarettes the past 20 to 30 years and drinks four 40 ounces beers daily.   FAMILY HISTORY: Father died of cancer, mother had hypertension.   REVIEW OF SYSTEMS:  He does not have any fever, chills, nausea, vomiting, or headache. No rashes or skin lesions. He denies postnasal drip. He has had some cough but no wheezing or unexplained weight loss.   PHYSICAL EXAMINATION: Blood pressure is 149/90, temperature 97.9, pulse 85.   GENERAL: Well-developed, well-nourished male in no acute distress.   HEAD AND FACE: Head is normocephalic, atraumatic. No facial skin lesions. Facial strength is normal and symmetric.    EARS: External ears are unremarkable. Ear canals are free of cerumen. Tympanic membranes are clear bilaterally.   NOSE: External nose unremarkable. Nasal cavity is free of any purulence  or polyps. The septum is relatively straight. There is some scabbing around the nasal hairs on the left side, but no active bleeding. I tried to remove some of the scabbing; some of it came loose and I did not see any underlying mass lesion. The right nasal cavity, and septum are clear. Oral cavity and oropharynx: Lips, gums, tongue, floor of mouth, and posterior pharynx are unremarkable with no lesions. There is no erythema or exudate.   NECK: Neck is supple without adenopathy or mass. There is no thyromegaly. Salivary glands are soft and nontender without masses.   NEUROLOGIC EXAM: Cranial nerves II through XII are grossly intact.   ASSESSMENT: The patient has had some issues of recurrent epistaxis in the left side. He has some nasal vestibulitis notable on today's exam where he has had repeated scabbing on that side.   PLAN: There is no need to acutely intervene as he has obviously not had a nosebleed several days. A more appropriate evaluation could be done at my office including cauterization of any vessels on the nasal septum if necessary. I would like to put him on gentamicin ointment to be applied to the left nostril 3 to 4 times daily for the next couple weeks to allow this area to heal and have him follow up in my office after discharge.   ____________________________ Sammuel Hines. Richardson Landry, MD psb:rw D: 02/19/2013 17:48:20 ET T: 02/19/2013 18:56:43 ET JOB#: [id number]  cc: Sammuel Hines. Richardson Landry, MD, <Dictator> Riley Nearing MD ELECTRONICALLY SIGNED 02/25/2013 8:29

## 2014-11-27 NOTE — H&P (Signed)
PATIENT NAME:  Thomas [name], Thomas [name] MR#:  [id number] DATE OF BIRTH:  [birth year]  DATE OF ADMISSION:  02/19/2013  PRIMARY CARE PHYSICIAN: Does not have one.   CHIEF COMPLAINT: Seizures.   HISTORY OF PRESENT ILLNESS: This is a 64 year old male who presents to the hospital as he had a witnessed seizure at his friend's house for 30 seconds. The patient cannot recall the events himself. The patient presented to the hospital postictal but is a lot more alert and awake and oriented now. He does have a significant history of alcohol abuse, drinks about four 40s of beer every single day, with his last drink being earlier this morning.   The patient presented to the hospital postictal to the seizure. Got some Ativan en route. Has not had any further seizures in the Emergency Room. The patient was noted to be mildly hyponatremic, with a sodium of 123. Hospitalist services were contacted for further treatment and evaluation.   The patient denies any headache, any nausea, any vomiting, any chest pain, any shortness of breath, any fevers, chills. Does admit to a cough which is chronic in nature, but no other associated symptoms.   REVIEW OF SYSTEMS:  CONSTITUTIONAL: No documented fever. No weight gain. No weight loss.  EYES: No blurred or double vision.  ENT: No tinnitus. No postnasal drip. No redness of the oropharynx.  RESPIRATORY: Positive cough. No wheeze. No hemoptysis. Positive COPD.  CARDIOVASCULAR: No chest pain, no orthopnea, no palpitations, no syncope.  GASTROINTESTINAL: No nausea. No vomiting. No diarrhea. No abdominal pain. No melena or hematochezia.  GENITOURINARY: No dysuria, no hematuria.  ENDOCRINE: No polyuria or nocturia. No heat or cold intolerance.  HEMATOLOGIC: No anemia, no bruising, no bleeding.  INTEGUMENTARY: No rashes. No lesions.  MUSCULOSKELETAL: No arthritis, no swelling, no gout.  NEUROLOGIC: No numbness or tingling. No ataxia. Positive seizure-type activity.  PSYCHIATRIC: No  anxiety, no insomnia. No ADD.   PAST MEDICAL HISTORY: Consistent with alcohol abuse, history of alcohol withdrawal seizures, tobacco abuse, history of COPD with ongoing tobacco abuse, hypertension.   ALLERGIES: No known drug allergies.   SOCIAL HISTORY: Does smoke about a pack per day. Has been smoking for the past 20 to  30 years. Does drink about four 40-ounce beers daily. No other illicit drug abuse. Lives with his sister.   FAMILY HISTORY: The patient's father died from cancer. He cannot recall. Mother has hypertension. She is at a skilled nursing facility. He does not recall any of any of her other medical history.   CURRENT MEDICATIONS: He is currently on no medications.   PHYSICAL EXAMINATION:  On admission the patient's vital signs were noted to be temperature is 98.7, pulse 91, respirations 18, blood pressure 133/83, sats 97% on 2 liters nasal cannula.  GENERAL: He is a pleasant-appearing male, lethargic, but in no apparent distress.  HEENT: He is atraumatic, normocephalic. His extraocular muscles are intact. His pupils are equal and reactive to light. His sclerae are anicteric. No conjunctival injection. No pharyngeal erythema.  NECK: Supple. There was no jugular venous distention. No bruits, no lymphadenopathy or thyromegaly.  HEART: Regular rate and rhythm, tachycardic. No murmurs, no rubs, no clicks.  LUNGS: He has some coarse rhonchi and wheezing diffusely. Negative use of accessory muscles. Dullness to percussion. Prolonged inspiratory and expiratory phase.  ABDOMEN: Soft, flat, nontender, nondistended. He has good bowel sounds. No hepatosplenomegaly appreciated.  EXTREMITIES: No evidence of any cyanosis, clubbing, or peripheral edema. Has +2 pedal and radial pulses bilaterally.  NEUROLOGICAL: The patient is alert, awake, and oriented x 3. No other focal motor or sensory deficits appreciated bilaterally.  SKIN: Moist and warm, with no rash appreciated.  LYMPHATIC: There is no  cervical or axillary lymphadenopathy.   LABORATORY EXAM: Showed a serum glucose of 173, BUN 8, creatinine 1.09, sodium 123, potassium 3.4, chloride 88, bicarbonate 17.   LFTs are within normal limits.   Urine toxicology is essentially normal.   Alcohol level is less than 0.03. White cell count is 10.4, hemoglobin 15.1, hematocrit 42.8, platelet count 173, INR is 0.9.   The patient did have a CT of the head done, which showed no evidence of any acute intracranial abnormality.   ASSESSMENT AND PLAN: This is a 64 year old male with a history of alcohol abuse, history of alcohol withdrawal seizures, tobacco abuse, chronic obstructive pulmonary disease with ongoing tobacco abuse, history of hypertension, presents to the hospital due to a witnessed seizure.   1.  Seizures: These are likely alcohol withdrawal seizures, possibly complicated with underlying hyponatremia, although the patient did say he had a drink earlier today. For now, I will place him on seizure precautions, continue [place] Withdrawal Assessment (CIWA), hold any further antiepileptics. For now, will dose Ativan on a p.r.n. basis for now. His CT head does not show any evidence of acute intracranial abnormality.  2.  Hyponatremia: This is likely related to beer potomania as he is a heavy drinker. I will gently hydrate him with IV fluids, follow his sodium.  3. Chronic obstructive pulmonary disease: There is no evidence of a chronic obstructive pulmonary disease exacerbation. I will continue his Symbicort and Spiriva and some p.r.n. DuoNebs.  4.  Tobacco abuse: Put him on a nicotine patch.  5.  Alcohol abuse: I will place him on the CIWA protocol, get a psychiatric consult for substance abuse.  6. Hypokalemia: This is likely secondary to his ongoing alcohol abuse. Will replace his potassium accordingly and repeat it in the morning.  7.  Hypertension: The patient apparently used to be on antihypertensives, although has not  taken them in months. His blood pressure is fairly stable here. I will continue to follow his hemodynamics as he likely will get some clonidine from the CIWA protocol. If needed, we will add further antihypertensives like p.r.n. hydralazine or p.o. metoprolol.   THE PATIENT IS A FULL CODE.   Time spent with admission: Fifty minutes.    ____________________________ Belia Heman. Verdell Carmine, MD vjs:dm D: 02/19/2013 10:24:07 ET T: 02/19/2013 10:46:26 ET JOB#: [id number]  cc: Belia Heman. Verdell Carmine, MD, <Dictator> Henreitta Leber MD ELECTRONICALLY SIGNED 02/24/2013 14:47

## 2014-11-27 NOTE — Discharge Summary (Signed)
PATIENT NAME:  Thomas [name], Thomas [name] MR#:  [id number] DATE OF BIRTH:  08/19/1950  DATE OF ADMISSION:  02/19/2013 DATE OF DISCHARGE:  02/22/2013  For a detailed note, please take a look at the history and physical on admission.   DIAGNOSES AT DISCHARGE:  Are as follows:  1.  Alcohol withdrawal seizure.  2.  Alcohol abuse with withdrawal.  3.  Chronic obstructive pulmonary disease with ongoing tobacco abuse.  4.  Hypertension.  5.  Hyponatremia. 6.  Hypokalemia.  7.  Left nares soft tissue swelling.   [place] COURSE:  Dr. Clyde Canterbury from ENT.    PERTINENT STUDIES DONE DURING THE HOSPITAL COURSE:  A CT scan of the head done without contrast on admission showing no acute intracranial process.   HOSPITAL COURSE:  This is a 64 year old male with medical problems as mentioned above presented to the hospital after having an alcohol withdrawal seizure and also noted to be severely hyponatremic. 1.  Seizures.  This was likely alcohol withdrawal seizure.  The patient has a history of heavy alcohol abuse, had not drank for about 48 hours prior to coming into the hospital and was witnessed to have a seizure.  The patient was therefore admitted to the hospital, had a CT head which was negative.  He has had no further seizure-type activity while in the hospital.  He received some intermittent IV Ativan.  He is currently not being discharged on any antiepileptics, but is strongly being advised to abstain from alcohol.  2.  Alcohol withdrawal.  The patient has had no evidence of hallucinations or agitation in the past 48 hours.  He has been maintained on a CIWA scale.  He was strongly advised quitting drinking.  He is being referred for outpatient substance abuse counseling when he goes home.   3.  Hyponatremia.  This was likely hypovolemic hyponatremia from beer potomania and poor by mouth intake.  The patient was hydrated with IV fluids.  His sodium has since then improved and now  normalized.  4.  Hypokalemia.  This was also secondary to poor by mouth intake and his ongoing alcohol abuse.  It has since then been supplemented and now resolved.  5.  Epistaxis with a left nares lesion.  The patient apparently has had occasional epistaxis on and off for the past few weeks.  I did obtain an ENT consult.  Dr. Richardson Landry evaluated the patient.  As per him he has some nasal vestibulitis.  He is being discharged on some gentamicin ointment to be applied 4 times daily and outpatient follow-up with ENT.  6.  Hypertension.  The patient remained hemodynamically stable.  He will be discharged on some low-dose lisinopril for now.  7.  COPD with ongoing tobacco abuse.  The patient had no evidence of acute COPD exacerbation.  He was ambulated on room air, did not desaturate below 97%.  He currently is being discharged on Symbicort, Spiriva as stated and a prednisone taper.   CODE STATUS:  The patient is a FULL CODE.   Time spent on discharge is 40 minutes.    ____________________________ Belia Heman. Verdell Carmine, MD vjs:ea D: 02/22/2013 13:50:04 ET T: 02/23/2013 01:29:59 ET JOB#: [id number]  cc: Belia Heman. Verdell Carmine, MD, <Dictator> Henreitta Leber MD ELECTRONICALLY SIGNED 02/24/2013 14:47

## 2014-11-29 NOTE — Consult Note (Signed)
PATIENT NAME:  OSTEN, JANEK MR#:  [id number] DATE OF BIRTH:  15-Oct-1950  DATE OF CONSULTATION:  08/29/2011  REFERRING PHYSICIAN:   CONSULTING PHYSICIAN:  Rafi Kenneth B. Carlei Huang, MD  REASON FOR CONSULTATION: To evaluate patient with alcoholism.   IDENTIFYING DATA: Mr. Dross is a 64 year old feet male with a history of depression and alcohol dependence.   CHIEF COMPLAINT: "I need detox."   HISTORY OF PRESENT ILLNESS: Mr. Marchena has a lifelong history of alcoholism as he started drinking at the age of 50. He has been consuming over a case of beer daily. He came to the hospital completely drunk with blood alcohol level of 386. He is unable to explain why he decided to stop drinking now but he explains that while drinking he is not taking any of his medications including antidepressants, and feels that it affects his general health too much. He denies other than alcohol substance use. He does not endorse many symptoms of depression but does complain of decreased sleep, anhedonia, decreased energy, social isolation, feelings of guilt, hopelessness, and worthlessness. He denies ever having thoughts of suicide or thoughts of hurting others. There are no psychotic symptoms but the patient reports that several years ago he was in delirium tremens with hallucinations. He is examined in the Emergency Room and in spite of treatment he is already having some withdrawal symptoms with nausea, sweats, shakes, and feeling of malaise.   PAST PSYCHIATRIC HISTORY: He has had multiple hospitalizations at [facility] and multiple admissions to [facility], the last time, however, several years ago. He has never been hospitalized in a psychiatric unit for other than detox. He denies any suicide attempts in the past. He does have a primary physician who prescribes Effexor for depression and Klonopin for anxiety. He has not been compliant with treatment.   FAMILY PSYCHIATRIC HISTORY: None  reported.   PAST MEDICAL HISTORY:  1. Hypertension.  2. Chronic obstructive pulmonary disease.  3. Status post knee replacement.   ALLERGIES: No known drug allergies.   MEDICATIONS ON ADMISSION: The patient denies taking any.   SOCIAL HISTORY: The patient is disabled. He lives with his "old friend" who is not a drinker.   REVIEW OF SYSTEMS:  CONSTITUTIONAL: No fevers or chills. Positive for some weight loss. EYES: No double or blurred vision. ENT: No hearing loss. RESPIRATORY: Positive for occasional shortness of breath. CARDIOVASCULAR: No chest pain or orthopnea. GI: Positive for nausea and abdominal pain. GU: No incontinence or frequency. ENDOCRINE: No heat or cold intolerance. LYMPHATIC: No anemia or easy bruising. INTEGUMENT: No acne or rash.  MUSCULOSKELETAL: No muscle or joint pain. NEUROLOGIC: No tingling or weakness.  PSYCHIATRIC: See history of present illness for details.   PHYSICAL EXAMINATION: VITAL SIGNS: Blood pressure 148/82, pulse 105, respirations 18, temperature 97.1.   GENERAL: This is a well-developed male in no acute distress. The rest of the physical examination is deferred to his primary attending. Muscle strength and tone are normal in all extremities. No stiffness or cogwheeling. Mild tremor of the hands.   LABORATORY, DIAGNOSTIC, AND RADIOLOGICAL DATA: Chemistries: Blood glucose 104, BUN 2, creatinine 0.74, sodium 131, potassium 4. Calcium 8.3. Blood alcohol level 0.386,  repeated after 14 hours, 0.124. LFTs within normal limits except for AST of 99 and ALT of 96. TSH 0.93. Urine tox screen negative for substances. CBC within normal limits. Serum acetaminophen less than 2. Serum salicylates 4.2.   MENTAL STATUS EXAMINATION: The patient is examined in the Emergency  Room. He is asleep but easily arousable. He is ill-appearing, complains of physical symptoms. He is oriented to person, place, time, and situation, although he is asking if it is tomorrow already. He is  pleasant and cooperative. He is wearing hospital scrubs with half of his face covered with a tower due to gagging. He maintains good eye contact. His speech is soft. Mood is depressed with flat affect. Thought processing is logical and goal oriented. Thought content: He denies suicidal or homicidal ideation. There are no delusions or paranoia. There are no auditory or visual hallucinations. His cognition is grossly intact. His insight and judgment are questionable.   SUICIDE RISK ASSESSMENT: This is a patient with a lifelong history of alcoholism and depression who is treatment noncompliant and continues to drink. He came to the hospital asking for alcohol detox.   DIAGNOSIS:  AXIS I: Alcohol dependence. Mood disorder not otherwise specified.   AXIS II: Deferred.   AXIS III:  Hypertension. Chronic obstructive pulmonary disease.   AXIS IV: Mental illness, substance abuse treatment compliance, primary support.   AXIS V: GAF 35.   PLAN:  1. The patient was accepted to [facility] for alcohol detox and substance abuse treatment. He will be transferred to the facility as soon as his blood alcohol level is below 100.  2. Please continue CIWA protocol as initiated by the Emergency Room physician.   3. We will hold on restarting his Effexor due to GI upset. 4. We do not recommend clonazepam in a patient with an alcohol abuse problem.   ____________________________ Wardell Honour Bary Leriche, MD jbp:bjt D: 08/29/2011 14:57:56 ET T: 08/29/2011 15:55:18 ET JOB#: [id number]  cc: Areatha Kalata B. Bary Leriche, MD, <Dictator> Clovis Fredrickson MD ELECTRONICALLY SIGNED 08/29/2011 23:34

## 2014-11-29 NOTE — Consult Note (Signed)
Brief Consult Note: Diagnosis: Alcohol dependence.   Patient was seen by consultant.   Consult note dictated.   Recommend further assessment or treatment.   Discussed with Attending MD.   Comments: [name] is an alcoholic. He was accepted to [facility] for alcohol detox. He will be transferred to their facility once BAL <100. See disctated consultation.  Electronic Signatures: Orson Slick (MD)  (Signed 22-Jan-13 14:59)  Authored: Brief Consult Note   Last Updated: 22-Jan-13 14:59 by Orson Slick (MD)

## 2014-12-11 ENCOUNTER — Inpatient Hospital Stay
Admission: EM | Admit: 2014-12-11 | Discharge: 2014-12-18 | DRG: 896 | Disposition: A | Discharge status: Home / Self Care | Payer: Medicaid Other | Attending: Internal Medicine | Admitting: Internal Medicine

## 2014-12-11 DIAGNOSIS — Z7951 Long term (current) use of inhaled steroids: Secondary | ICD-10-CM

## 2014-12-11 DIAGNOSIS — J9600 Acute respiratory failure, unspecified whether with hypoxia or hypercapnia: Secondary | ICD-10-CM | POA: Diagnosis present

## 2014-12-11 DIAGNOSIS — R0989 Other specified symptoms and signs involving the circulatory and respiratory systems: Secondary | ICD-10-CM

## 2014-12-11 DIAGNOSIS — R569 Unspecified convulsions: Secondary | ICD-10-CM

## 2014-12-11 DIAGNOSIS — Z9221 Personal history of antineoplastic chemotherapy: Secondary | ICD-10-CM

## 2014-12-11 DIAGNOSIS — E039 Hypothyroidism, unspecified: Secondary | ICD-10-CM | POA: Diagnosis present

## 2014-12-11 DIAGNOSIS — Z85810 Personal history of malignant neoplasm of tongue: Secondary | ICD-10-CM

## 2014-12-11 DIAGNOSIS — I10 Essential (primary) hypertension: Secondary | ICD-10-CM

## 2014-12-11 DIAGNOSIS — F10239 Alcohol dependence with withdrawal, unspecified: Principal | ICD-10-CM | POA: Diagnosis present

## 2014-12-11 DIAGNOSIS — E871 Hypo-osmolality and hyponatremia: Secondary | ICD-10-CM

## 2014-12-11 DIAGNOSIS — J449 Chronic obstructive pulmonary disease, unspecified: Secondary | ICD-10-CM

## 2014-12-11 DIAGNOSIS — F17210 Nicotine dependence, cigarettes, uncomplicated: Secondary | ICD-10-CM | POA: Diagnosis present

## 2014-12-11 DIAGNOSIS — G4089 Other seizures: Secondary | ICD-10-CM | POA: Diagnosis present

## 2014-12-11 DIAGNOSIS — N400 Enlarged prostate without lower urinary tract symptoms: Secondary | ICD-10-CM | POA: Diagnosis present

## 2014-12-11 DIAGNOSIS — E860 Dehydration: Secondary | ICD-10-CM | POA: Diagnosis present

## 2014-12-11 DIAGNOSIS — F1020 Alcohol dependence, uncomplicated: Secondary | ICD-10-CM

## 2014-12-11 DIAGNOSIS — E876 Hypokalemia: Secondary | ICD-10-CM | POA: Diagnosis present

## 2014-12-11 MED ORDER — LORAZEPAM 2 MG/ML IJ SOLN
INTRAMUSCULAR | Status: AC
  Filled 2014-12-11: qty 1

## 2014-12-11 MED ORDER — LORAZEPAM 2 MG/ML IJ SOLN
1.0000 mg | Freq: Once | INTRAMUSCULAR | Status: AC
  Administered 2014-12-11: 1 mg via INTRAVENOUS

## 2014-12-11 NOTE — ED Notes (Signed)
Pt to ED via EMS for witness seizure activity for an undetermined amount of time. Per EMS, pt was witnessed "seizing" by family members who called EMS. Per report, pt regularly drinks 2-3 40oz beers everyday day, but did not drink as much today. Pt is currently alert, slightly disoriented, with slurred speech.

## 2014-12-11 NOTE — ED Notes (Signed)
Family at bedside. Family informed this RN that the pt's seizure activity lasted for approximately 3-4 mins prior to calling EMS.

## 2014-12-12 ENCOUNTER — Encounter: Payer: Self-pay | Admitting: Internal Medicine

## 2014-12-12 ENCOUNTER — Emergency Department: Payer: Medicaid Other

## 2014-12-12 DIAGNOSIS — E039 Hypothyroidism, unspecified: Secondary | ICD-10-CM | POA: Diagnosis present

## 2014-12-12 DIAGNOSIS — J9600 Acute respiratory failure, unspecified whether with hypoxia or hypercapnia: Secondary | ICD-10-CM | POA: Diagnosis present

## 2014-12-12 DIAGNOSIS — E871 Hypo-osmolality and hyponatremia: Secondary | ICD-10-CM

## 2014-12-12 DIAGNOSIS — F17210 Nicotine dependence, cigarettes, uncomplicated: Secondary | ICD-10-CM | POA: Diagnosis present

## 2014-12-12 DIAGNOSIS — I10 Essential (primary) hypertension: Secondary | ICD-10-CM | POA: Diagnosis present

## 2014-12-12 DIAGNOSIS — R569 Unspecified convulsions: Secondary | ICD-10-CM

## 2014-12-12 DIAGNOSIS — J449 Chronic obstructive pulmonary disease, unspecified: Secondary | ICD-10-CM

## 2014-12-12 DIAGNOSIS — E860 Dehydration: Secondary | ICD-10-CM | POA: Diagnosis present

## 2014-12-12 DIAGNOSIS — I509 Heart failure, unspecified: Secondary | ICD-10-CM | POA: Diagnosis not present

## 2014-12-12 DIAGNOSIS — G4089 Other seizures: Secondary | ICD-10-CM | POA: Diagnosis present

## 2014-12-12 DIAGNOSIS — F10239 Alcohol dependence with withdrawal, unspecified: Secondary | ICD-10-CM | POA: Diagnosis present

## 2014-12-12 DIAGNOSIS — Z9221 Personal history of antineoplastic chemotherapy: Secondary | ICD-10-CM | POA: Diagnosis not present

## 2014-12-12 DIAGNOSIS — Z85810 Personal history of malignant neoplasm of tongue: Secondary | ICD-10-CM | POA: Diagnosis not present

## 2014-12-12 DIAGNOSIS — Z7951 Long term (current) use of inhaled steroids: Secondary | ICD-10-CM | POA: Diagnosis not present

## 2014-12-12 DIAGNOSIS — E876 Hypokalemia: Secondary | ICD-10-CM | POA: Diagnosis present

## 2014-12-12 DIAGNOSIS — N400 Enlarged prostate without lower urinary tract symptoms: Secondary | ICD-10-CM | POA: Diagnosis present

## 2014-12-12 DIAGNOSIS — F1020 Alcohol dependence, uncomplicated: Secondary | ICD-10-CM

## 2014-12-12 LAB — BASIC METABOLIC PANEL
ANION GAP: 12 (ref 5–15)
ANION GAP: 7 (ref 5–15)
BUN: 7 mg/dL (ref 6–20)
BUN: 7 mg/dL (ref 6–20)
CALCIUM: 7.8 mg/dL — AB (ref 8.9–10.3)
CALCIUM: 8.5 mg/dL — AB (ref 8.9–10.3)
CO2: 26 mmol/L (ref 22–32)
CO2: 27 mmol/L (ref 22–32)
CREATININE: 0.88 mg/dL (ref 0.61–1.24)
Chloride: 74 mmol/L — ABNORMAL LOW (ref 101–111)
Chloride: 79 mmol/L — ABNORMAL LOW (ref 101–111)
Creatinine, Ser: 0.91 mg/dL (ref 0.61–1.24)
GFR calc Af Amer: >60 mL/min (ref >60)
GFR calc Af Amer: >60 mL/min (ref >60)
GFR calc non Af Amer: >60 mL/min (ref >60)
GLUCOSE: 103 mg/dL — AB (ref 65–99)
Glucose, Bld: 99 mg/dL (ref 65–99)
POTASSIUM: 3.1 mmol/L — AB (ref 3.5–5.1)
Potassium: 3 mmol/L — ABNORMAL LOW (ref 3.5–5.1)
SODIUM: 112 mmol/L — AB (ref 135–145)
Sodium: 113 mmol/L — CL (ref 135–145)

## 2014-12-12 LAB — CBC
HCT: 33 % — ABNORMAL LOW (ref 40.0–52.0)
HCT: 35.2 % — ABNORMAL LOW (ref 40.0–52.0)
HEMOGLOBIN: 12.2 g/dL — AB (ref 13.0–18.0)
Hemoglobin: 11.4 g/dL — ABNORMAL LOW (ref 13.0–18.0)
MCH: 32.2 pg (ref 26.0–34.0)
MCH: 32.3 pg (ref 26.0–34.0)
MCHC: 34.5 g/dL (ref 32.0–36.0)
MCHC: 34.7 g/dL (ref 32.0–36.0)
MCV: 93.1 fL (ref 80.0–100.0)
MCV: 93.4 fL (ref 80.0–100.0)
Platelets: 205 10*3/uL (ref 150–440)
Platelets: 226 10*3/uL (ref 150–440)
RBC: 3.54 MIL/uL — AB (ref 4.40–5.90)
RBC: 3.78 MIL/uL — ABNORMAL LOW (ref 4.40–5.90)
RDW: 14.2 % (ref 11.5–14.5)
RDW: 14.4 % (ref 11.5–14.5)
WBC: 7.4 10*3/uL (ref 3.8–10.6)
WBC: 9.3 10*3/uL (ref 3.8–10.6)

## 2014-12-12 MED ORDER — NICOTINE 10 MG IN INHA
1.0000 | RESPIRATORY_TRACT | Status: DC | PRN
  Administered 2014-12-14: 1 via RESPIRATORY_TRACT
  Filled 2014-12-12: qty 33

## 2014-12-12 MED ORDER — DULOXETINE HCL 30 MG PO CPEP
30.0000 mg | ORAL_CAPSULE | Freq: Every day | ORAL | Status: DC
  Administered 2014-12-12 – 2014-12-17 (×5): 30 mg via ORAL
  Filled 2014-12-12 (×5): qty 1

## 2014-12-12 MED ORDER — OXYCODONE HCL 5 MG PO TABS: 15.0000 mg | ORAL_TABLET | Freq: Two times a day (BID) | ORAL | Status: DC

## 2014-12-12 MED ORDER — MONTELUKAST SODIUM 10 MG PO TABS
10.0000 mg | ORAL_TABLET | Freq: Every day | ORAL | Status: DC
  Administered 2014-12-12 – 2014-12-17 (×5): 10 mg via ORAL
  Filled 2014-12-12 (×5): qty 1

## 2014-12-12 MED ORDER — VITAMIN B-1 100 MG PO TABS
ORAL_TABLET | ORAL | Status: AC
Start: 2014-12-12 — End: 2014-12-12
  Filled 2014-12-12: qty 1

## 2014-12-12 MED ORDER — VITAMIN B-1 100 MG PO TABS
100.0000 mg | ORAL_TABLET | Freq: Every day | ORAL | Status: DC
  Administered 2014-12-12: 100 mg via ORAL

## 2014-12-12 MED ORDER — AMLODIPINE BESYLATE 5 MG PO TABS
2.5000 mg | ORAL_TABLET | Freq: Every day | ORAL | Status: DC
  Administered 2014-12-12 – 2014-12-16 (×4): 2.5 mg via ORAL
  Filled 2014-12-12 (×4): qty 1

## 2014-12-12 MED ORDER — GABAPENTIN 300 MG PO CAPS
600.0000 mg | ORAL_CAPSULE | Freq: Two times a day (BID) | ORAL | Status: DC
  Administered 2014-12-12 – 2014-12-17 (×12): 600 mg via ORAL
  Filled 2014-12-12 (×12): qty 2

## 2014-12-12 MED ORDER — PNEUMOCOCCAL VAC POLYVALENT 25 MCG/0.5ML IJ INJ: 0.5000 mL | INJECTION | INTRAMUSCULAR | Status: DC

## 2014-12-12 MED ORDER — THIAMINE HCL 100 MG/ML IJ SOLN: 100.0000 mg | Freq: Every day | INTRAMUSCULAR | Status: DC

## 2014-12-12 MED ORDER — MORPHINE SULFATE 2 MG/ML IJ SOLN
2.0000 mg | INTRAMUSCULAR | Status: DC | PRN
  Administered 2014-12-12 (×3): 2 mg via INTRAVENOUS
  Filled 2014-12-12 (×3): qty 1

## 2014-12-12 MED ORDER — FAMOTIDINE 20 MG PO TABS
20.0000 mg | ORAL_TABLET | Freq: Two times a day (BID) | ORAL | Status: DC
  Administered 2014-12-12 – 2014-12-17 (×11): 20 mg via ORAL
  Filled 2014-12-12 (×11): qty 1

## 2014-12-12 MED ORDER — ACETAMINOPHEN 325 MG PO TABS
650.0000 mg | ORAL_TABLET | Freq: Four times a day (QID) | ORAL | Status: DC | PRN
  Administered 2014-12-12 – 2014-12-14 (×2): 650 mg via ORAL
  Filled 2014-12-12 (×4): qty 2

## 2014-12-12 MED ORDER — SODIUM CHLORIDE 0.9 % IV SOLN
INTRAVENOUS | Status: DC
  Administered 2014-12-12 – 2014-12-13 (×2): via INTRAVENOUS

## 2014-12-12 MED ORDER — LEVOTHYROXINE SODIUM 100 MCG PO TABS
100.0000 ug | ORAL_TABLET | Freq: Every day | ORAL | Status: DC
  Administered 2014-12-12 – 2014-12-17 (×5): 100 ug via ORAL
  Filled 2014-12-12 (×4): qty 1
  Filled 2014-12-12: qty 2

## 2014-12-12 MED ORDER — ENSURE ENLIVE PO LIQD
237.0000 mL | Freq: Three times a day (TID) | ORAL | Status: DC
  Administered 2014-12-12 – 2014-12-18 (×12): 237 mL via ORAL

## 2014-12-12 MED ORDER — POTASSIUM CHLORIDE CRYS ER 20 MEQ PO TBCR
40.0000 meq | EXTENDED_RELEASE_TABLET | ORAL | Status: AC | PRN
  Administered 2014-12-14 (×2): 40 meq via ORAL
  Filled 2014-12-12 (×2): qty 2

## 2014-12-12 MED ORDER — THIAMINE HCL 100 MG/ML IJ SOLN
Freq: Every day | INTRAVENOUS | Status: AC
  Administered 2014-12-12: 10:00:00 via INTRAVENOUS
  Filled 2014-12-12: qty 1000

## 2014-12-12 MED ORDER — CLONAZEPAM 1 MG PO TABS
1.0000 mg | ORAL_TABLET | Freq: Four times a day (QID) | ORAL | Status: DC
  Administered 2014-12-12 (×4): 1 mg via ORAL
  Filled 2014-12-12 (×4): qty 1

## 2014-12-12 MED ORDER — ADULT MULTIVITAMIN W/MINERALS CH: 1.0000 | ORAL_TABLET | Freq: Every day | ORAL | Status: DC

## 2014-12-12 MED ORDER — ONDANSETRON HCL 4 MG PO TABS: 4.0000 mg | ORAL_TABLET | Freq: Four times a day (QID) | ORAL | Status: DC | PRN

## 2014-12-12 MED ORDER — SODIUM CHLORIDE 0.9 % IJ SOLN
3.0000 mL | Freq: Two times a day (BID) | INTRAMUSCULAR | Status: DC
  Administered 2014-12-13 – 2014-12-17 (×8): 3 mL via INTRAVENOUS

## 2014-12-12 MED ORDER — TAMSULOSIN HCL 0.4 MG PO CAPS
0.4000 mg | ORAL_CAPSULE | Freq: Every day | ORAL | Status: DC
  Administered 2014-12-12 – 2014-12-17 (×5): 0.4 mg via ORAL
  Filled 2014-12-12 (×5): qty 1

## 2014-12-12 MED ORDER — HEPARIN SODIUM (PORCINE) 5000 UNIT/ML IJ SOLN
5000.0000 [IU] | Freq: Three times a day (TID) | INTRAMUSCULAR | Status: DC
  Administered 2014-12-12 – 2014-12-18 (×19): 5000 [IU] via SUBCUTANEOUS
  Filled 2014-12-12 (×20): qty 1

## 2014-12-12 MED ORDER — BUDESONIDE-FORMOTEROL FUMARATE 80-4.5 MCG/ACT IN AERO
2.0000 | INHALATION_SPRAY | Freq: Two times a day (BID) | RESPIRATORY_TRACT | Status: DC
  Administered 2014-12-12 – 2014-12-17 (×11): 2 via RESPIRATORY_TRACT
  Filled 2014-12-12: qty 6.9

## 2014-12-12 MED ORDER — OXYCODONE HCL 5 MG PO TABS
10.0000 mg | ORAL_TABLET | Freq: Two times a day (BID) | ORAL | Status: DC
  Administered 2014-12-12 (×2): 10 mg via ORAL
  Filled 2014-12-12 (×2): qty 2

## 2014-12-12 MED ORDER — TIOTROPIUM BROMIDE MONOHYDRATE 18 MCG IN CAPS
1.0000 | ORAL_CAPSULE | Freq: Every day | RESPIRATORY_TRACT | Status: DC
  Administered 2014-12-12 – 2014-12-17 (×5): 18 ug via RESPIRATORY_TRACT
  Filled 2014-12-12: qty 5

## 2014-12-12 MED ORDER — LORAZEPAM 2 MG/ML IJ SOLN
0.0000 mg | Freq: Four times a day (QID) | INTRAMUSCULAR | Status: AC
  Administered 2014-12-12: 1 mg via INTRAVENOUS
  Administered 2014-12-13: 4 mg via INTRAVENOUS
  Administered 2014-12-13 (×3): 2 mg via INTRAVENOUS
  Filled 2014-12-12 (×2): qty 1
  Filled 2014-12-12: qty 2
  Filled 2014-12-12 (×2): qty 1

## 2014-12-12 MED ORDER — LORAZEPAM 2 MG/ML IJ SOLN
0.0000 mg | Freq: Two times a day (BID) | INTRAMUSCULAR | Status: DC
Start: 2014-12-12 — End: 2014-12-12

## 2014-12-12 MED ORDER — ONDANSETRON HCL 4 MG/2ML IJ SOLN
4.0000 mg | Freq: Four times a day (QID) | INTRAMUSCULAR | Status: DC | PRN
  Administered 2014-12-12: 4 mg via INTRAVENOUS
  Filled 2014-12-12: qty 2

## 2014-12-12 MED ORDER — ACETAMINOPHEN 650 MG RE SUPP
650.0000 mg | Freq: Four times a day (QID) | RECTAL | Status: DC | PRN
  Administered 2014-12-13: 650 mg via RECTAL
  Filled 2014-12-12: qty 1

## 2014-12-12 NOTE — Progress Notes (Signed)
Notified Dr. Posey Pronto about critical lab value of Na+ 113. MD to place orders.

## 2014-12-12 NOTE — H&P (Signed)
[facility] [place] NAME: Thomas [name]    MR#:  [id number]  DATE OF BIRTH:  Dec 06, 1950   DATE OF ADMISSION:  12/11/2014  PRIMARY CARE PHYSICIAN: PROVIDER NOT IN SYSTEM   REQUESTING/REFERRING PHYSICIAN: Quale  CHIEF COMPLAINT:   Chief Complaint  Patient presents with  . Seizures    HISTORY OF PRESENT ILLNESS:  Thomas [name]  is a 64 y.o. male with a known history of alcohol abuse, usually drinks 4x 40 ounce beers daily however recently cut down presenting with a witnessed seizure. Patient is a rather poor historian, history aided by daughter at bedside. As stated above patient usually drinks 440 ounce beers daily however the last few days he's cut down to 1 beer daily. His daughter states that he appeared somewhat more confused than baseline for 1 day total. He subsequently had a witnessed seizure, tonic-clonic lasting approximately 1 minute with positive post ictal confusion. Brought to the emergency department no focal neurological deficits but remains lethargic. Incidentally found to have low sodium.  PAST MEDICAL HISTORY:   Past Medical History  Diagnosis Date  . Hypertension   . Cancer of nasal cavities   . Enlarged prostate   . Tongue cancer   . Renal disorder   . Lung infection     no lung cancer  . Radiation     to neck  . Status post chemotherapy     PAST SURGICAL HISTORY:   Past Surgical History  Procedure Laterality Date  . Cancerous lymph nodes removed from neck    . Knee surgery Right     SOCIAL HISTORY:   History  Substance Use Topics  . Smoking status: Heavy Tobacco Smoker    Types: Cigarettes  . Smokeless tobacco: Not on file  . Alcohol Use: Yes     Comment: 40oz x4 daily    FAMILY HISTORY:   Family History  Problem Relation Age of Onset  . Hypertension Mother     DRUG ALLERGIES:  No Known Allergies  REVIEW OF SYSTEMS:  REVIEW OF SYSTEMS:  CONSTITUTIONAL: Denies fevers, chills,  positive for fatigue, weakness.  EYES: Denies blurred vision, double vision, or eye pain.  EARS, NOSE, THROAT: Denies tinnitus, ear pain, hearing loss.  RESPIRATORY: denies cough, shortness of breath, wheezing  CARDIOVASCULAR: Denies chest pain, palpitations, edema.  GASTROINTESTINAL: Denies nausea, vomiting, diarrhea, abdominal pain.  GENITOURINARY: Denies dysuria, hematuria.  ENDOCRINE: Denies nocturia or thyroid problems. HEMATOLOGIC AND LYMPHATIC: Denies easy bruising or bleeding.  SKIN: Denies rash or lesions.  MUSCULOSKELETAL: Denies pain in neck, back, shoulder, knees, hips, or further arthritic symptoms.  NEUROLOGIC: Denies paralysis, paresthesias. Positive for seizure PSYCHIATRIC: Denies anxiety or depressive symptoms. Otherwise full review of systems performed by me is negative.   MEDICATIONS AT HOME:   Prior to Admission medications   Medication Sig Start Date End Date Taking? Authorizing Provider  budesonide-formoterol (SYMBICORT) 80-4.5 MCG/ACT inhaler Inhale 2 puffs into the lungs 2 (two) times daily. 09/27/11  Yes Historical Provider, MD  clonazePAM (KLONOPIN) 1 MG tablet Take 1 mg by mouth 3 (three) times daily as needed. 03/21/11  Yes Historical Provider, MD  DULoxetine (CYMBALTA) 30 MG capsule Take 30 mg by mouth daily. 09/30/13  Yes Historical Provider, MD  famotidine (PEPCID) 20 MG tablet Take 20 mg by mouth 2 (two) times daily. 06/24/13 12/11/14 Yes Historical Provider, MD  levothyroxine (SYNTHROID, LEVOTHROID) 100 MCG tablet Take 100 mcg by mouth daily. 05/25/14 05/25/15 Yes Historical  Provider, MD  ondansetron (ZOFRAN) 8 MG tablet Take 8 mg by mouth every 8 (eight) hours as needed.  FOR NAUSEA 11/06/13  Yes Historical Provider, MD  tamsulosin (FLOMAX) 0.4 MG CAPS capsule Take 0.4 mg by mouth daily. 06/01/14 06/01/15 Yes Historical Provider, MD  tiotropium (SPIRIVA HANDIHALER) 18 MCG inhalation capsule Place 1 capsule into inhaler and inhale daily. 09/27/11  Yes Historical  Provider, MD  Oxycodone HCl 10 MG TABS Take 10 mg by mouth every 6 (six) hours as needed. For pain 04/30/14   Historical Provider, MD      VITAL SIGNS:  Blood pressure 163/97, pulse 95, temperature 98.6 F (37 C), temperature source Oral, resp. rate 16, height 5\' 8"  (1.727 m), weight 170 lb (77.111 kg), SpO2 100 %.  PHYSICAL EXAMINATION:  VITAL SIGNS: Filed Vitals:   12/12/14 0105  BP: 163/97  Pulse: 95  Temp:   Resp: 16   GENERAL:63 y.o.male disheveled and unkempt, hard of hearing HEAD: Normocephalic, atraumatic.  EYES: Pupils equal, round, reactive to light. Extraocular muscles intact. No scleral icterus.  MOUTH: Moist mucosal membrane. Poor dentition . No abscess noted.  EAR, NOSE, THROAT: Clear without exudates. No external lesions.  NECK: Supple. No thyromegaly. No nodules. No JVD.  PULMONARY: Clear to ascultation, without wheeze rails or rhonci. No use of accessory muscles, Good respiratory effort. good air entry bilaterally CHEST: Nontender to palpation.  CARDIOVASCULAR: S1 and S2. Regular rate and rhythm. No murmurs, rubs, or gallops. No edema. Pedal pulses 2+ bilaterally.  GASTROINTESTINAL: Soft, nontender, nondistended. No masses. Positive bowel sounds. No hepatosplenomegaly.  MUSCULOSKELETAL: No swelling, clubbing, or edema. Range of motion full in all extremities.  NEUROLOGIC: Cranial nerves II through XII are intact. No gross focal neurological deficits. Sensation intact. Reflexes intact.  SKIN: No ulceration, lesions, rashes, or cyanosis. Skin warm and dry. Turgor intact.  PSYCHIATRIC: Mood, affect flattened. The patient is awake, alert and oriented x 3. However responses somewhat slow Insight, judgment appear intact.    LABORATORY PANEL:   CBC  Recent Labs Lab 12/12/14 0035  WBC 9.3  HGB 12.2*  HCT 35.2*  PLT 226   ------------------------------------------------------------------------------------------------------------------  Chemistries   Recent  Labs Lab 12/12/14 0035  NA 112*  K 3.1*  CL 74*  CO2 26  GLUCOSE 103*  BUN 7  CREATININE 0.91  CALCIUM 8.5*   ------------------------------------------------------------------------------------------------------------------  Cardiac Enzymes No results for input(s): TROPONINI in the last 168 hours. ------------------------------------------------------------------------------------------------------------------  RADIOLOGY:  Ct Head Wo Contrast  12/12/2014   CLINICAL DATA:  Seizures.  EXAM: CT HEAD WITHOUT CONTRAST  TECHNIQUE: Contiguous axial images were obtained from the base of the skull through the vertex without intravenous contrast.  COMPARISON:  02/19/2013  FINDINGS: Mild cerebral atrophy is unchanged. There is no evidence of acute cortical infarct, intracranial hemorrhage, mass, midline shift, or extra-axial fluid collection. 3 mm hypodensity in the right putamen is more conspicuous than on the prior study but is likely unchanged and may represent a chronic lacunar infarct or dilated perivascular space.  Visualized portions of the orbits are unremarkable. There are small to moderate right and small left mastoid effusions. The visualized paranasal sinuses are clear. Moderate carotid siphon calcification is noted bilaterally.  IMPRESSION: 1. No evidence of acute intracranial abnormality. 2. Bilateral mastoid effusions.   Electronically Signed   By: Logan Bores   On: 12/12/2014 00:30    EKG:   Orders placed or performed during the hospital encounter of 12/11/14  . EKG (if new onset seizures)  .  EKG (if new onset seizures)    IMPRESSION AND PLAN:   64 year old Caucasian gentleman with known history of alcohol abuse as well as withdrawal seizures presenting after a witnessed generalized tonic-clonic seizure.  1. Seizure: 2 possible etiologies include alcohol withdrawal as well as hyponatremia, neuro checks every 4 hours. 2. Hyponatremia: Sodium deficit 462: to correct sodium to  122, 1295 mEq total deficit IV fluid hydration with normal saline at rate of 125 mL an hour, follow sodium levels. 3. Alcohol abuse: Initiate CIWA protocol. 4. Hypothyroidism, unspecified type: Continue Synthroid. 5. COPD not in acute exacerbation: Continue home medications. 6. Venous thromboembolism prophylactic: Heparin subcutaneous  All the records are reviewed and case discussed with ED provider. Management plans discussed with the patient, family and they are in agreement.  CODE STATUS: Full  TOTAL TIME TAKING CARE OF THIS PATIENT: 45 minutes.    Virjean Boman,  Karenann Cai.D on 12/12/2014 at 1:41 AM  Between 7am to 6pm - Pager - [phone]  After 6pm: House Pager: - [phone]  [facility]  Office  [phone]  CC: Primary care physician; [place]

## 2014-12-12 NOTE — ED Notes (Addendum)
CBG at 1247 am= 104 mg/dL

## 2014-12-12 NOTE — ED Provider Notes (Signed)
[facility] Emergency Department Provider Note    ____________________________________________  Time seen: 1110  I have reviewed the triage vital signs and the nursing notes.   HISTORY  Chief Complaint Seizures       HPI Thomas [name] is a 64 y.o. male who presents with witnessed seizure. Patient does not recall the event, however EMS reports the patient was noted to have a witnessed seizure. Per family on scene, who are not with the patient presently, this may be related to slightly decreased alcohol consumption. He is a regular drinker.  Patient denies any pain or concerns in the ER.  History is limited by poor patient recall. EM caveat.  The patient is able to tell me is that some sort of an episode occurred, he is not really sure what, that he feels fine right now. He does say that he may have not drink as much as he normally does, but he does still continue to drink beer.  Patient denies any headache, fever, weakness, numbness, tingling, or other concerns on review of systems. Note however that the patient does seem to have slightly poor recall, and review of systems may not be 100% reliable in this patient.   Past Medical History  Diagnosis Date  . Hypertension   . Cancer of nasal cavities   . Enlarged prostate   . Tongue cancer   . Renal disorder   . Lung infection     no lung cancer  . Radiation     to neck  . Status post chemotherapy     There are no active problems to display for this patient.   Past Surgical History  Procedure Laterality Date  . Cancerous lymph nodes removed from neck    . Knee surgery Right     Current Outpatient Rx  Name  Route  Sig  Dispense  Refill  . budesonide-formoterol (SYMBICORT) 80-4.5 MCG/ACT inhaler   Inhalation   Inhale 2 puffs into the lungs 2 (two) times daily.         . clonazePAM (KLONOPIN) 1 MG tablet   Oral   Take 1 mg by mouth 3 (three) times daily as needed.         .  DULoxetine (CYMBALTA) 30 MG capsule   Oral   Take 30 mg by mouth daily.         Marland Kitchen EXPIRED: famotidine (PEPCID) 20 MG tablet   Oral   Take 20 mg by mouth 2 (two) times daily.         Marland Kitchen levothyroxine (SYNTHROID, LEVOTHROID) 100 MCG tablet   Oral   Take 100 mcg by mouth daily.         . ondansetron (ZOFRAN) 8 MG tablet   Oral   Take 8 mg by mouth every 8 (eight) hours as needed.  FOR NAUSEA         . tamsulosin (FLOMAX) 0.4 MG CAPS capsule   Oral   Take 0.4 mg by mouth daily.         Marland Kitchen tiotropium (SPIRIVA HANDIHALER) 18 MCG inhalation capsule   Inhalation   Place 1 capsule into inhaler and inhale daily.         . Oxycodone HCl 10 MG TABS   Oral   Take 10 mg by mouth every 6 (six) hours as needed. For pain           Allergies Review of patient's allergies indicates no known allergies.  No family history on  file.  Social History History  Substance Use Topics  . Smoking status: Heavy Tobacco Smoker    Types: Cigarettes  . Smokeless tobacco: Not on file  . Alcohol Use: Yes    Review of Systems EM caveat, review of systems is limited by poor patient recall. Constitutional: Negative for fever. Eyes: Negative for visual changes. ENT: Negative for sore throat. Has history of prior neck surgery from cancer. Cardiovascular: Negative for chest pain. Respiratory: Negative for shortness of breath. Gastrointestinal: Negative for abdominal pain, vomiting and diarrhea. Neurological: Negative for headaches, focal weakness or numbness.   ____________________________________________   PHYSICAL EXAM:  VITAL SIGNS: ED Triage Vitals  Enc Vitals Group     BP 12/11/14 2311 185/103 mmHg     Pulse Rate 12/11/14 2311 98     Resp 12/11/14 2311 22     Temp 12/11/14 2311 98.6 F (37 C)     Temp Source 12/11/14 2311 Oral     SpO2 12/11/14 2306 100 %     Weight 12/11/14 2311 170 lb (77.111 kg)     Height 12/11/14 2311 5\' 8"  (1.727 m)     Head Cir --      Peak Flow  --      Pain Score 12/11/14 2312 0     Pain Loc --      Pain Edu? --      Excl. in [place]? --     Constitutional: Alert and oriented. Cachectic appearance. Eyes: Conjunctivae are normal. PERRL. Normal extraocular movements. ENT   Head: Normocephalic and atraumatic.   Nose: No congestion/rhinnorhea.   Mouth/Throat: Mucous membranes are moist.   Neck: No stridor. Hematological/Lymphatic/Immunilogical: Scars from prior anterior surgical. Cardiovascular: Normal rate, regular rhythm. Normal and symmetric distal pulses are present in all extremities. No murmurs, rubs, or gallops. Respiratory: Normal respiratory effort without tachypnea nor retractions. Breath sounds are clear and equal bilaterally. No wheezes/rales/rhonchi. Gastrointestinal: Soft and nontender. No distention. No abdominal bruits. There is no CVA tenderness. Musculoskeletal: Nontender with normal range of motion in all extremities. No joint effusions.  No lower extremity tenderness nor edema. Neurologic:  Normal speech and language. No gross focal neurologic deficits are appreciated. Speech is normal. No gait instability. Skin:  Skin is warm, dry and intact. No rash noted. Psychiatric: Mood and affect are normal. Speech and behavior are normal. Patient exhibits appropriate insight and judgment.  ____________________________________________    LABS (pertinent positives/negatives)  Labs Reviewed  BASIC METABOLIC PANEL - Abnormal; Notable for the following:    Sodium 112 (*)    Potassium 3.1 (*)    Chloride 74 (*)    Glucose, Bld 103 (*)    Calcium 8.5 (*)    All other components within normal limits  CBC - Abnormal; Notable for the following:    RBC 3.78 (*)    Hemoglobin 12.2 (*)    HCT 35.2 (*)    All other components within normal limits  CBG MONITORING, ED     ____________________________________________   EKG  EKG is interpreted as normal sinus rhythm with associated right bundle branch block and  left axis deviation. Ventricular rate 91 PR interval 182 QRS 120 QTc 472. There is no evidence of acute ischemic changes.  ____________________________________________    RADIOLOGY  CT head no acute, bilateral mastoid effusions.  ____________________________________________   PROCEDURES  Procedure(s) performed: None  Critical Care performed: No  ____________________________________________   INITIAL IMPRESSION / ASSESSMENT AND PLAN / ED COURSE  Pertinent labs & imaging  results that were available during my care of the patient were reviewed by me and considered in my medical decision making (see chart for details).  Patient presents with concerns of new onset seizure. This is associated with potentially cutting down on alcohol consumption, however the patient does report that he does continue to drink. I am concerned based on his presentation and review of labs that patient may have severe hyponatremia leading to his seizure. He is presently neurologically stable, and I do not see indication for super salt at this time. However, the patient does require admission for Curlex correction of his sodium. This may be related to beer protomani.  Circumflex time stamp  At the present time we will admit the patient to the hospitalist service for ongoing treatment of his hyponatremia, possible alcohol withdrawal, and further workup and evaluation for his hyponatremiua and seizure. ____________________________________________   FINAL CLINICAL IMPRESSION(S) / ED DIAGNOSES  Final diagnoses:  None   hyponatremia, severe with associated seizure. Alcohol abuse and withdrawal   Delman Kitten, MD 12/12/14 0120

## 2014-12-12 NOTE — Progress Notes (Signed)
Pt admitted to [facility] from ED.  VSS. BP elevated. Continues on CIWA protocol as ordered.  Daughter at bedside.  Voiding without difficulty. Morphine and zofran given once as ordered with positive results. Resting quietly at this time.  No significant changes noted. Will cont. To monitor.

## 2014-12-12 NOTE — Progress Notes (Signed)
[facility]                                                                                                                                                                                            Patient Demographics   Thomas [name], is a 64 y.o. male, DOB - 1951-01-25, YIF:[id number]  Admit date - 12/11/2014   Admitting Physician Lytle Butte, MD  Outpatient Primary MD for the patient is PROVIDER NOT IN SYSTEM    Chief Complaint  Patient presents with  . Seizures      Pt with hx of heavy etoh use admited with sz and severe hyponatremia. Pt currently awake denies any cp or sob, Daughter at bedside.   Review of Systems:   CONSTITUTIONAL: No documented fever. No fatigue, weakness. No weight gain, no weight loss.  EYES: No blurry or double vision.  ENT: No tinnitus. No postnasal drip. No redness of the oropharynx.  RESPIRATORY: No cough, no wheeze, no hemoptysis. No dyspnea.  CARDIOVASCULAR: No chest pain. No orthopnea. No palpitations. No syncope.  GASTROINTESTINAL: No nausea, no vomiting or diarrhea. No abdominal pain. No melena or hematochezia.  GENITOURINARY: No dysuria or hematuria.  ENDOCRINE: No polyuria or nocturia. No heat or cold intolerance.  HEMATOLOGY: No anemia. No bruising. No bleeding.  INTEGUMENTARY: No rashes. No lesions.  MUSCULOSKELETAL: No arthritis. No swelling. No gout.  NEUROLOGIC: No numbness, tingling, or ataxia. No seizure-type activity.  PSYCHIATRIC: No anxiety. No insomnia. No ADD.    Vitals:   Filed Vitals:   12/12/14 0105 12/12/14 0212 12/12/14 0507 12/12/14 0750  BP: 163/97 162/100 143/79 138/76  Pulse: 95 101  89  Temp:  99 F (37.2 C)  98 F (36.7 C)  TempSrc:  Oral  Oral  Resp: 16 22  17   Height:  5\' 8"  (1.727 m)    Weight:  55.566 kg (122 lb 8 oz)    SpO2: 100% 100% 100% 99%    Wt Readings from Last 3 Encounters:  12/12/14 55.566 kg (122 lb 8 oz)  06/02/14 55.084 kg (121 lb 7 oz)      Intake/Output Summary (Last 24 hours) at 12/12/14 1343 Last data filed at 12/12/14 0950  Gross per 24 hour  Intake    784 ml  Output    700 ml  Net     84 ml    Physical Exam:   GENERAL: Pleasant-appearing in no apparent distress.  HEAD, EYES, EARS, NOSE AND THROAT: Atraumatic, normocephalic. Extraocular muscles are intact. Pupils equal and reactive to light. Sclerae anicteric. No conjunctival injection. No oro-pharyngeal erythema.  NECK: Supple. There is no jugular venous distention. No bruits, no lymphadenopathy, no thyromegaly.  HEART: Regular rate and rhythm, tachycardic. No murmurs, no rubs, no clicks.  LUNGS: Clear to auscultation bilaterally. No rales or rhonchi. No wheezes.  ABDOMEN: Soft, flat, nontender, nondistended. Has good bowel sounds. No hepatosplenomegaly appreciated.  EXTREMITIES: No evidence of any cyanosis, clubbing, or peripheral edema.  +2 pedal and radial pulses bilaterally.  NEUROLOGIC: The patient is alert, awake, and oriented x3 with no focal motor or sensory deficits appreciated bilaterally.  SKIN: Moist and warm with no rashes appreciated.     Antibiotics   Anti-infectives    None      Medications   Scheduled Meds: . amLODipine  2.5 mg Oral Daily  . budesonide-formoterol  2 puff Inhalation BID  . clonazePAM  1 mg Oral QID  . DULoxetine  30 mg Oral Daily  . famotidine  20 mg Oral BID  . feeding supplement (ENSURE ENLIVE)  237 mL Oral TID WC  . gabapentin  600 mg Oral BID  . heparin  5,000 Units Subcutaneous 3 times per day  . levothyroxine  100 mcg Oral Daily  . LORazepam  0-4 mg Intravenous 4 times per day  . montelukast  10 mg Oral Daily  . oxyCODONE  10 mg Oral BID  . [START ON 12/13/2014] pneumococcal 23 valent vaccine  0.5 mL Intramuscular Tomorrow-1000  . sodium chloride  3 mL Intravenous Q12H  . tamsulosin  0.4 mg Oral Daily  . tiotropium  1 capsule Inhalation Daily   Continuous Infusions: . sodium chloride 125 mL/hr at 12/12/14  0121   PRN Meds:.acetaminophen **OR** acetaminophen, morphine injection, nicotine, ondansetron **OR** ondansetron (ZOFRAN) IV, potassium chloride   Data Review:   Micro Results No results found for this or any previous visit (from the past 240 hour(s)).  Radiology Reports Ct Head Wo Contrast  12/12/2014   CLINICAL DATA:  Seizures.  EXAM: CT HEAD WITHOUT CONTRAST  TECHNIQUE: Contiguous axial images were obtained from the base of the skull through the vertex without intravenous contrast.  COMPARISON:  02/19/2013  FINDINGS: Mild cerebral atrophy is unchanged. There is no evidence of acute cortical infarct, intracranial hemorrhage, mass, midline shift, or extra-axial fluid collection. 3 mm hypodensity in the right putamen is more conspicuous than on the prior study but is likely unchanged and may represent a chronic lacunar infarct or dilated perivascular space.  Visualized portions of the orbits are unremarkable. There are small to moderate right and small left mastoid effusions. The visualized paranasal sinuses are clear. Moderate carotid siphon calcification is noted bilaterally.  IMPRESSION: 1. No evidence of acute intracranial abnormality. 2. Bilateral mastoid effusions.   Electronically Signed   By: Logan Bores   On: 12/12/2014 00:30     CBC  Recent Labs Lab 12/12/14 0035 12/12/14 0607  WBC 9.3 7.4  HGB 12.2* 11.4*  HCT 35.2* 33.0*  PLT 226 205  MCV 93.1 93.4  MCH 32.3 32.2  MCHC 34.7 34.5  RDW 14.4 14.2    Chemistries   Recent Labs Lab 12/12/14 0035 12/12/14 0607  NA 112* 113*  K 3.1* 3.0*  CL 74* 79*  CO2 26 27  GLUCOSE 103* 99  BUN 7 7  CREATININE 0.91 0.88  CALCIUM 8.5* 7.8*   ------------------------------------------------------------------------------------------------------------------ estimated creatinine clearance is 66.7 mL/min (by C-G formula based on Cr of  0.88). ------------------------------------------------------------------------------------------------------------------ No results for input(s): HGBA1C in the last 72 hours. ------------------------------------------------------------------------------------------------------------------ No results for input(s): CHOL, HDL, LDLCALC,  TRIG, CHOLHDL, LDLDIRECT in the last 72 hours. ------------------------------------------------------------------------------------------------------------------ No results for input(s): TSH, T4TOTAL, T3FREE, THYROIDAB in the last 72 hours.  Invalid input(s): FREET3 ------------------------------------------------------------------------------------------------------------------ No results for input(s): VITAMINB12, FOLATE, FERRITIN, TIBC, IRON, RETICCTPCT in the last 72 hours.  Coagulation profile No results for input(s): INR, PROTIME in the last 168 hours.  No results for input(s): DDIMER in the last 72 hours.  Cardiac Enzymes No results for input(s): CKMB, TROPONINI, MYOGLOBIN in the last 168 hours.  Invalid input(s): CK ------------------------------------------------------------------------------------------------------------------ Invalid input(s): POCBNP    Assessment & Plan   Principal Problem:   Seizure : Due to etoh withdrawal sz,continue CIWA protocol. Monitor for any further sign and sx of dt's Active Problems:   Alcohol abuse : pt on ciwa protocol, counceled regarding drinking cessation   Hyponatremia: due to dehydration and beer potomania- ivf  , follow na levels   Essential hypertension continue amlodopine.    COPD (chronic obstructive pulmonary disease)      Code Status Orders        Start     Ordered   12/12/14 0119  Full code   Continuous     12/12/14 0119      Family Communication: d/w daughter  Disposition Plan:  Home  Procedures    Consults     DVT Prophylaxis    SCDs  Lab Results  Component Value Date    PLT 205 12/12/2014     Time Spent in minutes 41min   Dustin Flock M.D on 12/12/2014 at 1:43 PM  Between 7am to 6pm - Pager - [phone]  After 6pm go to [email or web address] - password [email or web address] [facility]   Office  [phone]

## 2014-12-12 NOTE — Progress Notes (Addendum)
Initial Nutrition Assessment  INTERVENTION: Medical Food Supplement Therapy: will recommend Ensure Enlive (each supplement provides 350kcal and 20 grams of protein) TID Meals and Snacks: Cater to patient preferences; may need fluid restiction to aid with hyponatremia if po intake improved.  NUTRITION DIAGNOSIS:  Inadequate oral intake related to acute illness as evidenced by energy intake < or equal to 50% for > or equal to 5 days per family  GOAL:  Patient will meet greater than or equal to 90% of their needs  MONITOR:  Energy Intake Electrolyte and Renal Profile Anthropometrics Digestive System  REASON FOR ASSESSMENT:  Malnutrition Screening Tool   ASSESSMENT:  Pt admitted with seizures; per MD note pt was drinking 4 40oz beers daily and recently pt decreased to 1 beer daily for a few days PTA. Pt remains on CIWA protocol. PMHx: tongue cancer, cancerous lymph node removal, cancer of nasal cavaties, s/p chemotherapy and radiation, renal disorder  PO Intake: pt daughter reports pt po intake had increased after lymph node removal (unclear on date) but the past month has progressively worsened. Daughter reports pt has eaten 0% of meals for the past 7 days and even beer consumption had decreased.    Medications: NS at 12mL/hr, pepcid Labs: Electrolyte and Renal Profile:    Recent Labs Lab 12/12/14 0035 12/12/14 0607  BUN 7 7  CREATININE 0.91 0.88  NA 112* 113*  K 3.1* 3.0*   Glucose Profile: No results for input(s): GLUCAP in the last 72 hours. Protein Profile: No results for input(s): ALBUMIN in the last 168 hours.  Height:  Ht Readings from Last 1 Encounters:  12/12/14 5\' 8"  (1.727 m)    Weight:  Wt Readings from Last 1 Encounters:  12/12/14 122 lb 8 oz (55.566 kg)    Ideal Body Weight:     Wt Readings from Last 10 Encounters:  12/12/14 122 lb 8 oz (55.566 kg)  06/02/14 121 lb 7 oz (55.084 kg)    BMI:  Body mass index is 18.63 kg/(m^2).   Pt  daughter reports pt weight decreased with chemotherapy and radation this past fall and then it increased back. Daughter reports thinking weight of 133lbs aproximately 2 months ago (8% weight loss). Last weight recorded per [facility] 06/02/2014 121lbs.  Nutrition-Focused physical exam completed. Findings are WDL for fat depletion, muscle depletion, and edema.   Estimated Nutritional Needs:  Kcal:  1736-2052kcals, BEE: 1315kcals (IF 1.01-1.3)(AF 1.2)  Protein:  56-66g protein (1.0-1.2g/kg)  Fluid:  1390-1641mL of fluid (25-37mL/kg)  Diet Order:  Diet regular Room service appropriate?: Yes; Fluid consistency:: Thin  EDUCATION NEEDS:  Education needs no appropriate at this time   Intake/Output Summary (Last 24 hours) at 12/12/14 1408 Last data filed at 12/12/14 0950  Gross per 24 hour  Intake    784 ml  Output    700 ml  Net     84 ml    Last BM:  5/6  MODERATE Care Level  Dwyane Luo, RD, LDN Pager [phone]

## 2014-12-13 ENCOUNTER — Inpatient Hospital Stay: Payer: Medicaid Other

## 2014-12-13 LAB — BLOOD GAS, ARTERIAL
ACID-BASE DEFICIT: 1.4 mmol/L (ref 0.0–2.0)
Allens test (pass/fail): POSITIVE — AB
Bicarbonate: 26 mEq/L (ref 21.0–28.0)
FIO2: 0.28 %
O2 Saturation: 79.1 %
PATIENT TEMPERATURE: 37
PO2 ART: 49 mmHg — AB (ref 83.0–108.0)
pCO2 arterial: 54 mmHg — ABNORMAL HIGH (ref 32.0–48.0)
pH, Arterial: 7.29 — ABNORMAL LOW (ref 7.350–7.450)

## 2014-12-13 LAB — BASIC METABOLIC PANEL
ANION GAP: 7 (ref 5–15)
BUN: 12 mg/dL (ref 6–20)
CO2: 24 mmol/L (ref 22–32)
CREATININE: 0.98 mg/dL (ref 0.61–1.24)
Calcium: 7.6 mg/dL — ABNORMAL LOW (ref 8.9–10.3)
Chloride: 90 mmol/L — ABNORMAL LOW (ref 101–111)
Glucose, Bld: 94 mg/dL (ref 65–99)
Potassium: 3.3 mmol/L — ABNORMAL LOW (ref 3.5–5.1)
SODIUM: 121 mmol/L — AB (ref 135–145)

## 2014-12-13 LAB — CBC
HCT: 31.3 % — ABNORMAL LOW (ref 40.0–52.0)
HEMATOCRIT: 31.8 % — AB (ref 40.0–52.0)
HEMOGLOBIN: 10.7 g/dL — AB (ref 13.0–18.0)
Hemoglobin: 10.5 g/dL — ABNORMAL LOW (ref 13.0–18.0)
MCH: 32.2 pg (ref 26.0–34.0)
MCH: 31.9 pg (ref 26.0–34.0)
MCHC: 34.2 g/dL (ref 32.0–36.0)
MCHC: 33.2 g/dL (ref 32.0–36.0)
MCV: 94.2 fL (ref 80.0–100.0)
MCV: 96.2 fL (ref 80.0–100.0)
Platelets: 177 10*3/uL (ref 150–440)
Platelets: 173 10*3/uL (ref 150–440)
RBC: 3.33 MIL/uL — AB (ref 4.40–5.90)
RBC: 3.31 MIL/uL — ABNORMAL LOW (ref 4.40–5.90)
RDW: 14.6 % — ABNORMAL HIGH (ref 11.5–14.5)
RDW: 14.5 % (ref 11.5–14.5)
WBC: 13.3 10*3/uL — ABNORMAL HIGH (ref 3.8–10.6)
WBC: 13.6 10*3/uL — ABNORMAL HIGH (ref 3.8–10.6)

## 2014-12-13 LAB — URINALYSIS COMPLETE WITH MICROSCOPIC (ARMC ONLY)
Bilirubin Urine: NEGATIVE
Glucose, UA: NEGATIVE mg/dL
Nitrite: POSITIVE — AB
PROTEIN: NEGATIVE mg/dL
SPECIFIC GRAVITY, URINE: 1.006 (ref 1.005–1.030)
pH: 5 (ref 5.0–8.0)

## 2014-12-13 LAB — GLUCOSE, CAPILLARY
GLUCOSE-CAPILLARY: 104 mg/dL — AB (ref 70–99)
Glucose-Capillary: 97 mg/dL (ref 70–99)

## 2014-12-13 LAB — BRAIN NATRIURETIC PEPTIDE: B Natriuretic Peptide: 222 pg/mL — ABNORMAL HIGH (ref 0.0–100.0)

## 2014-12-13 LAB — MAGNESIUM: MAGNESIUM: 1.5 mg/dL — AB (ref 1.7–2.4)

## 2014-12-13 MED ORDER — ALBUTEROL SULFATE (2.5 MG/3ML) 0.083% IN NEBU: 2.5000 mg | INHALATION_SOLUTION | RESPIRATORY_TRACT | Status: DC | PRN

## 2014-12-13 MED ORDER — ONDANSETRON HCL 4 MG PO TABS: 8.0000 mg | ORAL_TABLET | Freq: Three times a day (TID) | ORAL | Status: DC | PRN

## 2014-12-13 MED ORDER — MAGNESIUM OXIDE 400 (241.3 MG) MG PO TABS
400.0000 mg | ORAL_TABLET | Freq: Two times a day (BID) | ORAL | Status: AC
  Administered 2014-12-13: 400 mg via ORAL
  Filled 2014-12-13 (×2): qty 1

## 2014-12-13 MED ORDER — DIPHENHYDRAMINE HCL 25 MG PO CAPS: 25.0000 mg | ORAL_CAPSULE | Freq: Every evening | ORAL | Status: DC | PRN

## 2014-12-13 MED ORDER — FUROSEMIDE 10 MG/ML IJ SOLN
20.0000 mg | Freq: Once | INTRAMUSCULAR | Status: AC
  Administered 2014-12-13: 20 mg via INTRAVENOUS
  Filled 2014-12-13: qty 2

## 2014-12-13 MED ORDER — POTASSIUM CHLORIDE 20 MEQ PO PACK
20.0000 meq | PACK | Freq: Every day | ORAL | Status: DC
  Administered 2014-12-14 – 2014-12-17 (×4): 20 meq via ORAL
  Filled 2014-12-13 (×4): qty 1

## 2014-12-13 MED ORDER — METHYLPREDNISOLONE SODIUM SUCC 125 MG IJ SOLR
60.0000 mg | INTRAMUSCULAR | Status: DC
  Administered 2014-12-13 – 2014-12-16 (×4): 60 mg via INTRAVENOUS
  Filled 2014-12-13 (×4): qty 2

## 2014-12-13 MED ORDER — SODIUM CHLORIDE 0.9 % IV SOLN
3.0000 g | Freq: Four times a day (QID) | INTRAVENOUS | Status: DC
  Administered 2014-12-13 – 2014-12-16 (×12): 3 g via INTRAVENOUS
  Filled 2014-12-13 (×20): qty 3

## 2014-12-13 NOTE — Progress Notes (Signed)
Temp 103 orally; Dr. Lavetta Nielsen notified; acknowledged; new orders placed;

## 2014-12-13 NOTE — Progress Notes (Signed)
Patient transferred to ICU from [facility], awake/alert, oriented to place. On 4L nasal cannula with oxygen saturation WNL. On CIWA protocol, resting quietly at this time. Will continue to monitor.

## 2014-12-13 NOTE — Progress Notes (Signed)
Patient transfer from [facility] today. Alert and oriented to self and place. Confused at times. 2L nasal cannula with oxygen saturation WNL. Tolerating food brought in by family for lunch. Voiding in urinal. Monitoring CIWA protocol. Resting quietly at this time. Will continue to monitor.

## 2014-12-13 NOTE — Progress Notes (Signed)
Called by nursing staff for patient with fever and decreased mental status Patient evaluated bedside Patient is responsive to loud verbal stimuli However, is more lethargic than earlier with noted desaturation. Insertion for etiology of fever check urinalysis, blood culture, chest x-ray ABG ordered  ABG: 7.29/54/49/26 Chest x-ray: Bedside read, minimal interstitial edema without frank infiltration  Given hypercapnic respiratory failure initiate BiPAP therapy will follow

## 2014-12-13 NOTE — Progress Notes (Signed)
[place] provided pastoral care/ministry of presence to daughter of Pt; daughter tearful at times.  Tomasita Crumble [phone]   12/13/14 0200  Clinical Encounter Type  Visited With Patient and family together  Visit Type Spiritual support  Referral From Nurse  Spiritual Encounters  Spiritual Needs Prayer;Emotional  Stress Factors  Patient Stress Factors None identified  Family Stress Factors (daughter of PT expressed anxiety, fear that PT was dying)

## 2014-12-13 NOTE — Progress Notes (Signed)
[place] at [facility]                                                                                                                                                                                            Patient Demographics   Thomas [name], is a 64 y.o. male, DOB - 1950/09/19, [id number]  Admit date - 12/11/2014   Admitting Physician Lytle Butte, MD  Outpatient Primary MD for the patient is PROVIDER NOT IN SYSTEM    Chief Complaint  Patient presents with  . Seizures      Patient was noted to have decrease in responsiveness. He was assessed by Dr. Nadara Mustard from night. He was placed on BiPAP currently patient on BiPAP. He also had a chest x-ray which showed possible congestive heart failure. Daughter is at bedside. Patient was agitated earlier and had to receive Ativan.   Review of Systems:   Patient unable to provide review of systems due to his decrease in mental status  Vitals:   Filed Vitals:   12/13/14 0300 12/13/14 0700 12/13/14 0831 12/13/14 1130  BP:   90/65   Pulse:  82  86  Temp: 98 F (36.7 C)     TempSrc: Axillary     Resp:      Height:      Weight:      SpO2:  100% 100% 95%    Wt Readings from Last 3 Encounters:  12/12/14 55.566 kg (122 lb 8 oz)  06/02/14 55.084 kg (121 lb 7 oz)     Intake/Output Summary (Last 24 hours) at 12/13/14 1216 Last data filed at 12/13/14 1143  Gross per 24 hour  Intake   4273 ml  Output   1450 ml  Net   2823 ml    Physical Exam:   GENERAL: Currently on BiPAP and poorly responsive. HEAD, EYES, EARS, NOSE AND THROAT: Atraumatic, normocephalic. Extraocular muscles are intact. Pupils equal and reactive to light. Sclerae anicteric. No conjunctival injection. No oro-pharyngeal erythema.  NECK: Supple. There is no jugular venous distention. No bruits, no lymphadenopathy, no thyromegaly.  HEART: Regular rate and rhythm, tachycardic. No murmurs, no rubs, no clicks.  LUNGS: On BiPAP,  bilateral crackles. No sensory muscle usage  ABDOMEN: Soft, flat, nontender, nondistended. Has good bowel sounds. No hepatosplenomegaly appreciated.  EXTREMITIES: No evidence of any cyanosis, clubbing, or peripheral edema.  +2 pedal and radial pulses bilaterally.  NEUROLOGIC: Limited neurologic exam due to patient being lethargic  SKIN: Moist and warm with no rashes appreciated.     Antibiotics   Anti-infectives    Start  Dose/Rate Route Frequency Ordered Stop   12/13/14 1115  Ampicillin-Sulbactam (UNASYN) 3 g in sodium chloride 0.9 % 100 mL IVPB     3 g 100 mL/hr over 60 Minutes Intravenous Every 6 hours 12/13/14 1034        Medications   Scheduled Meds: . amLODipine  2.5 mg Oral Daily  . ampicillin-sulbactam (UNASYN) IV  3 g Intravenous Q6H  . budesonide-formoterol  2 puff Inhalation BID  . DULoxetine  30 mg Oral Daily  . famotidine  20 mg Oral BID  . feeding supplement (ENSURE ENLIVE)  237 mL Oral TID WC  . gabapentin  600 mg Oral BID  . heparin  5,000 Units Subcutaneous 3 times per day  . levothyroxine  100 mcg Oral Daily  . LORazepam  0-4 mg Intravenous 4 times per day  . magnesium oxide  400 mg Oral BID  . montelukast  10 mg Oral Daily  . pneumococcal 23 valent vaccine  0.5 mL Intramuscular Tomorrow-1000  . potassium chloride  20 mEq Oral Daily  . sodium chloride  3 mL Intravenous Q12H  . tamsulosin  0.4 mg Oral Daily  . tiotropium  1 capsule Inhalation Daily   Continuous Infusions:   PRN Meds:.acetaminophen **OR** acetaminophen, nicotine, [DISCONTINUED] ondansetron **OR** ondansetron (ZOFRAN) IV, ondansetron, potassium chloride   Data Review:   Micro Results No results found for this or any previous visit (from the past 240 hour(s)).  Radiology Reports Ct Head Wo Contrast  12/12/2014   CLINICAL DATA:  Seizures.  EXAM: CT HEAD WITHOUT CONTRAST  TECHNIQUE: Contiguous axial images were obtained from the base of the skull through the vertex without intravenous  contrast.  COMPARISON:  02/19/2013  FINDINGS: Mild cerebral atrophy is unchanged. There is no evidence of acute cortical infarct, intracranial hemorrhage, mass, midline shift, or extra-axial fluid collection. 3 mm hypodensity in the right putamen is more conspicuous than on the prior study but is likely unchanged and may represent a chronic lacunar infarct or dilated perivascular space.  Visualized portions of the orbits are unremarkable. There are small to moderate right and small left mastoid effusions. The visualized paranasal sinuses are clear. Moderate carotid siphon calcification is noted bilaterally.  IMPRESSION: 1. No evidence of acute intracranial abnormality. 2. Bilateral mastoid effusions.   Electronically Signed   By: Logan Bores   On: 12/12/2014 00:30   Dg Chest Port 1 View  12/13/2014   CLINICAL DATA:  Lung crackles  EXAM: PORTABLE CHEST - 1 VIEW  COMPARISON:  10/22/2010  FINDINGS: There is mild vascular prominence and interstitial thickening. There is ground-glass opacity in the central and basilar regions bilaterally. This may represent congestive heart failure. No large effusion is evident.  IMPRESSION: Probable congestive heart failure   Electronically Signed   By: Andreas Newport M.D.   On: 12/13/2014 02:06     CBC  Recent Labs Lab 12/12/14 0035 12/12/14 0607 12/13/14 0129 12/13/14 1104  WBC 9.3 7.4 13.3* 13.6*  HGB 12.2* 11.4* 10.7* 10.5*  HCT 35.2* 33.0* 31.3* 31.8*  PLT 226 205 177 173  MCV 93.1 93.4 94.2 96.2  MCH 32.3 32.2 32.2 31.9  MCHC 34.7 34.5 34.2 33.2  RDW 14.4 14.2 14.6* 14.5    Chemistries   Recent Labs Lab 12/12/14 0035 12/12/14 0607 12/13/14 0129  NA 112* 113* 121*  K 3.1* 3.0* 3.3*  CL 74* 79* 90*  CO2 26 27 24   GLUCOSE 103* 99 94  BUN 7 7 12   CREATININE 0.91 0.88  0.98  CALCIUM 8.5* 7.8* 7.6*  MG  --   --  1.5*   ------------------------------------------------------------------------------------------------------------------ estimated  creatinine clearance is 59.9 mL/min (by C-G formula based on Cr of 0.98). ------------------------------------------------------------------------------------------------------------------ No results for input(s): HGBA1C in the last 72 hours. ------------------------------------------------------------------------------------------------------------------ No results for input(s): CHOL, HDL, LDLCALC, TRIG, CHOLHDL, LDLDIRECT in the last 72 hours. ------------------------------------------------------------------------------------------------------------------ No results for input(s): TSH, T4TOTAL, T3FREE, THYROIDAB in the last 72 hours.  Invalid input(s): FREET3 ------------------------------------------------------------------------------------------------------------------ No results for input(s): VITAMINB12, FOLATE, FERRITIN, TIBC, IRON, RETICCTPCT in the last 72 hours.  Coagulation profile No results for input(s): INR, PROTIME in the last 168 hours.  No results for input(s): DDIMER in the last 72 hours.  Cardiac Enzymes No results for input(s): CKMB, TROPONINI, MYOGLOBIN in the last 168 hours.  Invalid input(s): CK ------------------------------------------------------------------------------------------------------------------ Invalid input(s): POCBNP    Assessment & Plan     Acute respiratory failure- cxr with b/l infiltrate, continue bipap, iv lasix x 1, check bnp, emperica IV abx, transfer to step down to monitor patient closely   Seizure : Due to etoh withdrawal sz,continue CIWA protocol. Monitor for any further sign and sx of dt's   Alcohol abuse : ciwa protocol   Hyponatremia: due to dehydration and beer potomania- ivf  , na improved   Essential hypertension continue amlodopine.    COPD (chronic obstructive pulmonary disease) add solumedrol in light of resp failure      Code Status Orders        Start     Ordered   12/12/14 0119  Full code   Continuous      12/12/14 0119      Family Communication: d/w daughter  Disposition Plan:  Home        DVT Prophylaxis    SCDs  Lab Results  Component Value Date   PLT 173 12/13/2014     Time Spent in minutes 77min Crtical care time   Dustin Flock M.D on 12/13/2014 at 12:16 PM  Between 7am to 6pm - Pager - [phone]  After 6pm go to [email or web address] - password [email or web address] [facility]   Office  [phone]

## 2014-12-14 ENCOUNTER — Inpatient Hospital Stay (HOSPITAL_COMMUNITY): Payer: Medicaid Other

## 2014-12-14 DIAGNOSIS — I509 Heart failure, unspecified: Secondary | ICD-10-CM

## 2014-12-14 LAB — BASIC METABOLIC PANEL
Anion gap: 7 (ref 5–15)
BUN: 11 mg/dL (ref 6–20)
CO2: 28 mmol/L (ref 22–32)
Calcium: 8.1 mg/dL — ABNORMAL LOW (ref 8.9–10.3)
Chloride: 89 mmol/L — ABNORMAL LOW (ref 101–111)
Creatinine, Ser: 1.08 mg/dL (ref 0.61–1.24)
Glucose, Bld: 156 mg/dL — ABNORMAL HIGH (ref 65–99)
POTASSIUM: 2.9 mmol/L — AB (ref 3.5–5.1)
SODIUM: 124 mmol/L — AB (ref 135–145)

## 2014-12-14 LAB — CBC
HCT: 30.4 % — ABNORMAL LOW (ref 40.0–52.0)
HEMOGLOBIN: 10.3 g/dL — AB (ref 13.0–18.0)
MCH: 32.1 pg (ref 26.0–34.0)
MCHC: 34 g/dL (ref 32.0–36.0)
MCV: 94.6 fL (ref 80.0–100.0)
Platelets: 156 10*3/uL (ref 150–440)
RBC: 3.21 MIL/uL — AB (ref 4.40–5.90)
RDW: 14.4 % (ref 11.5–14.5)
WBC: 12.4 10*3/uL — ABNORMAL HIGH (ref 3.8–10.6)

## 2014-12-14 LAB — MAGNESIUM: MAGNESIUM: 1.4 mg/dL — AB (ref 1.7–2.4)

## 2014-12-14 MED ORDER — LORAZEPAM 2 MG/ML IJ SOLN: 1.0000 mg | Freq: Four times a day (QID) | INTRAMUSCULAR | Status: AC | PRN

## 2014-12-14 MED ORDER — LORAZEPAM 2 MG/ML IJ SOLN: 2.0000 mg | Freq: Once | INTRAMUSCULAR | Status: DC

## 2014-12-14 MED ORDER — LORAZEPAM 2 MG/ML IJ SOLN: 1.0000 mg | Freq: Four times a day (QID) | INTRAMUSCULAR | Status: DC | PRN

## 2014-12-14 MED ORDER — POTASSIUM CHLORIDE 10 MEQ/100ML IV SOLN
10.0000 meq | INTRAVENOUS | Status: DC
  Filled 2014-12-14 (×4): qty 100

## 2014-12-14 MED ORDER — LORAZEPAM 2 MG/ML IJ SOLN: 0.0000 mg | Freq: Two times a day (BID) | INTRAMUSCULAR | Status: AC

## 2014-12-14 MED ORDER — LORAZEPAM 0.5 MG PO TABS
0.5000 mg | ORAL_TABLET | Freq: Four times a day (QID) | ORAL | Status: AC | PRN
Start: 2014-12-14 — End: 2014-12-17
  Administered 2014-12-14 – 2014-12-15 (×3): 0.5 mg via ORAL
  Filled 2014-12-14 (×2): qty 1

## 2014-12-14 MED ORDER — MAGNESIUM SULFATE 2 GM/50ML IV SOLN
2.0000 g | Freq: Once | INTRAVENOUS | Status: AC
  Administered 2014-12-14: 2 g via INTRAVENOUS
  Filled 2014-12-14: qty 50

## 2014-12-14 MED ORDER — LORAZEPAM 0.5 MG PO TABS
1.0000 mg | ORAL_TABLET | Freq: Four times a day (QID) | ORAL | Status: DC | PRN
  Administered 2014-12-14: 1 mg via ORAL
  Filled 2014-12-14: qty 2
  Filled 2014-12-14: qty 1

## 2014-12-14 MED ORDER — LORAZEPAM 2 MG/ML IJ SOLN: 0.0000 mg | Freq: Four times a day (QID) | INTRAMUSCULAR | Status: AC

## 2014-12-14 NOTE — Plan of Care (Signed)
Problem: Phase II Progression Outcomes Goal: Pain controlled Outcome: Progressing Pain meds given.

## 2014-12-14 NOTE — Progress Notes (Signed)
Patient moved to room 215 by wheelchair with York Cerise, orderly. Patient alert with no distress noted when leaving ICU. Family at side.

## 2014-12-14 NOTE — Progress Notes (Signed)
Report called to Barnett Applebaum, RN on [place]. Patient being moved to room 215. Daughter at bedside and aware. RN spoke with DR. Patel at [phone] and made MD aware that with 1mg  PO ativan PRN patient was very sleepy and that RN thought patient could benefit from 0.5mg  instead. Dr. Posey Pronto stated "I can not order at this time, I am driving but can you change the order to 0.5-1mg  PO PRN instead." RN also gave patient 2nd dose of PRN potassium.

## 2014-12-14 NOTE — Progress Notes (Signed)
This RN spoke with Dr. Posey Pronto and made MD aware that patient has order for PRN potassium orally 72meq q4H x2 doses if potassium is low. RN made MD aware that patient received 69meq of oral k+ this morning and has not yet received IV doses that are ordered. Dr. Posey Pronto stated "okay I will d/c the IV and you can give the 2nd PRN dose."

## 2014-12-14 NOTE — Progress Notes (Signed)
Pt is awake and responsive. But confused. Slurred speech. VSS. Inj ativan givenx1  As per order. UOP good. A fib on Cm.. Sao2 96% on 2 Lnc. lun g sound diminished. Resting in bed comfortably. Will continue to observe closely.

## 2014-12-14 NOTE — Progress Notes (Signed)
[place] at [facility]                                                                                                                                                                                            Patient Demographics   Thomas [name], is a 64 y.o. male, DOB - Jul 07, 1951, [id number]:[id number]  Admit date - 12/11/2014   Admitting Physician Lytle Butte, MD  Outpatient Primary MD for the patient is PROVIDER NOT IN SYSTEM    Chief Complaint  Patient presents with  . Seizures       Pt off oxygen, very agiated had to receive ativan now drowsy.   Review of Systems:   Patient unable to provide review of systems due to his decrease in mental status  Vitals:   Filed Vitals:   12/14/14 0400 12/14/14 0540 12/14/14 0600 12/14/14 0735  BP:  143/76  152/91  Pulse:  96  86  Temp:  98.2 F (36.8 C)  98.2 F (36.8 C)  TempSrc:  Oral  Oral  Resp: 25  21 16   Height:      Weight:      SpO2:  95%  98%    Wt Readings from Last 3 Encounters:  12/12/14 55.566 kg (122 lb 8 oz)  06/02/14 55.084 kg (121 lb 7 oz)     Intake/Output Summary (Last 24 hours) at 12/14/14 1140 Last data filed at 12/14/14 0856  Gross per 24 hour  Intake   1184 ml  Output   1910 ml  Net   -726 ml    Physical Exam:   GENERAL:  Decrease in responsiveness HEAD, EYES, EARS, NOSE AND THROAT: Atraumatic, normocephalic. Extraocular muscles are intact. Pupils equal and reactive to light. Sclerae anicteric. No conjunctival injection. No oro-pharyngeal erythema.  NECK: Supple. There is no jugular venous distention. No bruits, no lymphadenopathy, no thyromegaly.  HEART: Regular rate and rhythm, tachycardic. No murmurs, no rubs, no clicks.  LUNGS:   No accesory muscle usage , no wheezing or ronchi ABDOMEN: Soft, flat, nontender, nondistended. Has good bowel sounds. No hepatosplenomegaly appreciated.  EXTREMITIES: No evidence of any cyanosis, clubbing, or peripheral edema.  +2 pedal  and radial pulses bilaterally.  NEUROLOGIC: Limited neurologic exam due to patient being lethargic  SKIN: Moist and warm with no rashes appreciated.     Antibiotics   Anti-infectives    Start     Dose/Rate Route Frequency Ordered Stop   12/13/14 1115  Ampicillin-Sulbactam (UNASYN) 3 g in sodium chloride 0.9 % 100 mL IVPB     3 g 100 mL/hr over 60 Minutes  Intravenous Every 6 hours 12/13/14 1034        Medications   Scheduled Meds: . amLODipine  2.5 mg Oral Daily  . ampicillin-sulbactam (UNASYN) IV  3 g Intravenous Q6H  . budesonide-formoterol  2 puff Inhalation BID  . DULoxetine  30 mg Oral Daily  . famotidine  20 mg Oral BID  . feeding supplement (ENSURE ENLIVE)  237 mL Oral TID WC  . gabapentin  600 mg Oral BID  . heparin  5,000 Units Subcutaneous 3 times per day  . levothyroxine  100 mcg Oral Daily  . LORazepam  0-4 mg Intravenous Q6H   Followed by  . [START ON 12/16/2014] LORazepam  0-4 mg Intravenous Q12H  . LORazepam  2 mg Intravenous Once  . methylPREDNISolone (SOLU-MEDROL) injection  60 mg Intravenous Q24H  . montelukast  10 mg Oral Daily  . pneumococcal 23 valent vaccine  0.5 mL Intramuscular Tomorrow-1000  . potassium chloride  20 mEq Oral Daily  . sodium chloride  3 mL Intravenous Q12H  . tamsulosin  0.4 mg Oral Daily  . tiotropium  1 capsule Inhalation Daily   Continuous Infusions:   PRN Meds:.acetaminophen **OR** acetaminophen, albuterol, LORazepam **OR** LORazepam, nicotine, [DISCONTINUED] ondansetron **OR** ondansetron (ZOFRAN) IV, ondansetron, potassium chloride   Data Review:   Micro Results Recent Results (from the past 240 hour(s))  Culture, blood (routine x 2)     Status: None (Preliminary result)   Collection Time: 12/13/14  1:28 AM  Result Value Ref Range Status   Specimen Description BLOOD  Final   Special Requests NONE  Final   Culture NO GROWTH 1 DAY  Final   Report Status PENDING  Incomplete  Culture, blood (routine x 2)     Status: None  (Preliminary result)   Collection Time: 12/13/14  1:29 AM  Result Value Ref Range Status   Specimen Description BLOOD  Final   Special Requests NONE  Final   Culture NO GROWTH 1 DAY  Final   Report Status PENDING  Incomplete    Radiology Reports Ct Head Wo Contrast  12/12/2014   CLINICAL DATA:  Seizures.  EXAM: CT HEAD WITHOUT CONTRAST  TECHNIQUE: Contiguous axial images were obtained from the base of the skull through the vertex without intravenous contrast.  COMPARISON:  02/19/2013  FINDINGS: Mild cerebral atrophy is unchanged. There is no evidence of acute cortical infarct, intracranial hemorrhage, mass, midline shift, or extra-axial fluid collection. 3 mm hypodensity in the right putamen is more conspicuous than on the prior study but is likely unchanged and may represent a chronic lacunar infarct or dilated perivascular space.  Visualized portions of the orbits are unremarkable. There are small to moderate right and small left mastoid effusions. The visualized paranasal sinuses are clear. Moderate carotid siphon calcification is noted bilaterally.  IMPRESSION: 1. No evidence of acute intracranial abnormality. 2. Bilateral mastoid effusions.   Electronically Signed   By: Logan Bores   On: 12/12/2014 00:30   Dg Chest Port 1 View  12/13/2014   CLINICAL DATA:  Lung crackles  EXAM: PORTABLE CHEST - 1 VIEW  COMPARISON:  10/22/2010  FINDINGS: There is mild vascular prominence and interstitial thickening. There is ground-glass opacity in the central and basilar regions bilaterally. This may represent congestive heart failure. No large effusion is evident.  IMPRESSION: Probable congestive heart failure   Electronically Signed   By: Andreas Newport M.D.   On: 12/13/2014 02:06     CBC  Recent Labs Lab 12/12/14 0035  12/12/14 0607 12/13/14 0129 12/13/14 1104 12/14/14 0512  WBC 9.3 7.4 13.3* 13.6* 12.4*  HGB 12.2* 11.4* 10.7* 10.5* 10.3*  HCT 35.2* 33.0* 31.3* 31.8* 30.4*  PLT 226 205 177 173  156  MCV 93.1 93.4 94.2 96.2 94.6  MCH 32.3 32.2 32.2 31.9 32.1  MCHC 34.7 34.5 34.2 33.2 34.0  RDW 14.4 14.2 14.6* 14.5 14.4    Chemistries   Recent Labs Lab 12/12/14 0035 12/12/14 0607 12/13/14 0129 12/14/14 0512  NA 112* 113* 121* 124*  K 3.1* 3.0* 3.3* 2.9*  CL 74* 79* 90* 89*  CO2 26 27 24 28   GLUCOSE 103* 99 94 156*  BUN 7 7 12 11   CREATININE 0.91 0.88 0.98 1.08  CALCIUM 8.5* 7.8* 7.6* 8.1*  MG  --   --  1.5* 1.4*   ------------------------------------------------------------------------------------------------------------------ estimated creatinine clearance is 54.3 mL/min (by C-G formula based on Cr of 1.08). ------------------------------------------------------------------------------------------------------------------ No results for input(s): HGBA1C in the last 72 hours. ------------------------------------------------------------------------------------------------------------------ No results for input(s): CHOL, HDL, LDLCALC, TRIG, CHOLHDL, LDLDIRECT in the last 72 hours. ------------------------------------------------------------------------------------------------------------------ No results for input(s): TSH, T4TOTAL, T3FREE, THYROIDAB in the last 72 hours.  Invalid input(s): FREET3 ------------------------------------------------------------------------------------------------------------------ No results for input(s): VITAMINB12, FOLATE, FERRITIN, TIBC, IRON, RETICCTPCT in the last 72 hours.  Coagulation profile No results for input(s): INR, PROTIME in the last 168 hours.  No results for input(s): DDIMER in the last 72 hours.  Cardiac Enzymes No results for input(s): CKMB, TROPONINI, MYOGLOBIN in the last 168 hours.  Invalid input(s): CK ------------------------------------------------------------------------------------------------------------------ Invalid input(s): [place]     1. Acute respiratory failure- cxr with b/l  infiltrate, s/p tx with lasix with improvement in resp sx, also suspected aspiration   pna- on iv Unasyn.   2.  Seizure : Due to etoh withdrawal sz,continue CIWA protocol. Monitor for any further sign and sx of dt's   3. Alcohol abuse : ciwa protocol   4.  Hyponatremia: due to dehydration and beer potomania- na improving, ivf stopped   5. Essential hypertension continue amlodopine.    6.  COPD (chronic obstructive pulmonary disease) add solumedrol in light of resp failure   7. Hypokalemia being replaced   8.  hypomageniusm- replaced      Code Status Orders        Start     Ordered   12/12/14 0119  Full code   Continuous     12/12/14 0119      Family Communication: d/w daughter  Disposition Plan:  Home        DVT Prophylaxis    SCDs  Lab Results  Component Value Date   PLT 156 12/14/2014     Time Spent in minutes 82min Crtical care time   Dustin Flock M.D on 12/14/2014 at 11:40 AM  Between 7am to 6pm - Pager - [phone]  After 6pm go to [email or web address] - password [email or web address] [place] [facility]   Office  [phone]

## 2014-12-15 LAB — BASIC METABOLIC PANEL
Anion gap: 8 (ref 5–15)
BUN: 18 mg/dL (ref 6–20)
CHLORIDE: 94 mmol/L — AB (ref 101–111)
CO2: 27 mmol/L (ref 22–32)
Calcium: 8.5 mg/dL — ABNORMAL LOW (ref 8.9–10.3)
Creatinine, Ser: 0.99 mg/dL (ref 0.61–1.24)
Glucose, Bld: 143 mg/dL — ABNORMAL HIGH (ref 65–99)
Potassium: 4.2 mmol/L (ref 3.5–5.1)
SODIUM: 129 mmol/L — AB (ref 135–145)

## 2014-12-15 LAB — MAGNESIUM: MAGNESIUM: 1.9 mg/dL (ref 1.7–2.4)

## 2014-12-15 NOTE — Plan of Care (Signed)
Problem: Phase I Progression Outcomes Goal: Pain controlled with appropriate interventions Outcome: Progressing Pt appears to be resting Goal: OOB as tolerated unless otherwise ordered Outcome: Progressing Pt declined to ambulate; sat on edge of bed

## 2014-12-15 NOTE — Progress Notes (Signed)
[place] at [facility]                                                                                                                                                                                            Patient Demographics   Thomas [name], is a 64 y.o. male, DOB - 07-28-1951, FYB:[id number]  Admit date - 12/11/2014   Admitting Physician Lytle Butte, MD  Outpatient Primary MD for the patient is PROVIDER NOT IN SYSTEM    Chief Complaint  Patient presents with  . Seizures   Pt off o2 breathing better, still requirting ativan for withdrawal  Review of Systems:   Patient unable to provide review of systems due to his decrease in mental status  Vitals:   Filed Vitals:   12/14/14 2328 12/15/14 0118 12/15/14 0641 12/15/14 0744  BP: 132/73  139/69 157/77  Pulse: 88  79 75  Temp: 97.9 F (36.6 C)  97.5 F (36.4 C) 98 F (36.7 C)  TempSrc: Oral  Oral Oral  Resp: 18 16  17   Height:      Weight:      SpO2: 98% 98% 100% 100%    Wt Readings from Last 3 Encounters:  12/12/14 55.566 kg (122 lb 8 oz)  06/02/14 55.084 kg (121 lb 7 oz)     Intake/Output Summary (Last 24 hours) at 12/15/14 1420 Last data filed at 12/15/14 1242  Gross per 24 hour  Intake    411 ml  Output    575 ml  Net   -164 ml    Physical Exam:   GENERAL:  Decrease in responsiveness HEAD, EYES, EARS, NOSE AND THROAT: Atraumatic, normocephalic. Extraocular muscles are intact. Pupils equal and reactive to light. Sclerae anicteric. No conjunctival injection. No oro-pharyngeal erythema.  NECK: Supple. There is no jugular venous distention. No bruits, no lymphadenopathy, no thyromegaly.  HEART: Regular rate and rhythm, tachycardic. No murmurs, no rubs, no clicks.  LUNGS:   No accesory muscle usage , no wheezing or ronchi ABDOMEN: Soft, flat, nontender, nondistended. Has good bowel sounds. No hepatosplenomegaly appreciated.  EXTREMITIES: No evidence of any cyanosis,  clubbing, or peripheral edema.  +2 pedal and radial pulses bilaterally.  NEUROLOGIC: Limited neurologic exam due to patient being lethargic  SKIN: Moist and warm with no rashes appreciated.     Antibiotics   Anti-infectives    Start     Dose/Rate Route Frequency Ordered Stop   12/13/14 1115  Ampicillin-Sulbactam (UNASYN) 3 g in sodium chloride 0.9 % 100 mL IVPB     3 g 100 mL/hr over 60 Minutes Intravenous  Every 6 hours 12/13/14 1034        Medications   Scheduled Meds: . amLODipine  2.5 mg Oral Daily  . ampicillin-sulbactam (UNASYN) IV  3 g Intravenous Q6H  . budesonide-formoterol  2 puff Inhalation BID  . DULoxetine  30 mg Oral Daily  . famotidine  20 mg Oral BID  . feeding supplement (ENSURE ENLIVE)  237 mL Oral TID WC  . gabapentin  600 mg Oral BID  . heparin  5,000 Units Subcutaneous 3 times per day  . levothyroxine  100 mcg Oral Daily  . LORazepam  0-4 mg Intravenous Q6H   Followed by  . [START ON 12/16/2014] LORazepam  0-4 mg Intravenous Q12H  . LORazepam  2 mg Intravenous Once  . methylPREDNISolone (SOLU-MEDROL) injection  60 mg Intravenous Q24H  . montelukast  10 mg Oral Daily  . pneumococcal 23 valent vaccine  0.5 mL Intramuscular Tomorrow-1000  . potassium chloride  20 mEq Oral Daily  . sodium chloride  3 mL Intravenous Q12H  . tamsulosin  0.4 mg Oral Daily  . tiotropium  1 capsule Inhalation Daily   Continuous Infusions:   PRN Meds:.acetaminophen **OR** acetaminophen, albuterol, LORazepam **OR** LORazepam, nicotine, [DISCONTINUED] ondansetron **OR** ondansetron (ZOFRAN) IV, ondansetron   Data Review:   Micro Results Recent Results (from the past 240 hour(s))  Culture, blood (routine x 2)     Status: None (Preliminary result)   Collection Time: 12/13/14  1:28 AM  Result Value Ref Range Status   Specimen Description BLOOD  Final   Special Requests NONE  Final   Culture NO GROWTH 2 DAYS  Final   Report Status PENDING  Incomplete  Culture, blood (routine  x 2)     Status: None (Preliminary result)   Collection Time: 12/13/14  1:29 AM  Result Value Ref Range Status   Specimen Description BLOOD  Final   Special Requests NONE  Final   Culture NO GROWTH 2 DAYS  Final   Report Status PENDING  Incomplete    Radiology Reports Ct Head Wo Contrast  12/12/2014   CLINICAL DATA:  Seizures.  EXAM: CT HEAD WITHOUT CONTRAST  TECHNIQUE: Contiguous axial images were obtained from the base of the skull through the vertex without intravenous contrast.  COMPARISON:  02/19/2013  FINDINGS: Mild cerebral atrophy is unchanged. There is no evidence of acute cortical infarct, intracranial hemorrhage, mass, midline shift, or extra-axial fluid collection. 3 mm hypodensity in the right putamen is more conspicuous than on the prior study but is likely unchanged and may represent a chronic lacunar infarct or dilated perivascular space.  Visualized portions of the orbits are unremarkable. There are small to moderate right and small left mastoid effusions. The visualized paranasal sinuses are clear. Moderate carotid siphon calcification is noted bilaterally.  IMPRESSION: 1. No evidence of acute intracranial abnormality. 2. Bilateral mastoid effusions.   Electronically Signed   By: Logan Bores   On: 12/12/2014 00:30   Dg Chest Port 1 View  12/13/2014   CLINICAL DATA:  Lung crackles  EXAM: PORTABLE CHEST - 1 VIEW  COMPARISON:  10/22/2010  FINDINGS: There is mild vascular prominence and interstitial thickening. There is ground-glass opacity in the central and basilar regions bilaterally. This may represent congestive heart failure. No large effusion is evident.  IMPRESSION: Probable congestive heart failure   Electronically Signed   By: Andreas Newport M.D.   On: 12/13/2014 02:06     CBC  Recent Labs Lab 12/12/14 0035 12/12/14 0607 12/13/14  0129 12/13/14 1104 12/14/14 0512  WBC 9.3 7.4 13.3* 13.6* 12.4*  HGB 12.2* 11.4* 10.7* 10.5* 10.3*  HCT 35.2* 33.0* 31.3* 31.8* 30.4*   PLT 226 205 177 173 156  MCV 93.1 93.4 94.2 96.2 94.6  MCH 32.3 32.2 32.2 31.9 32.1  MCHC 34.7 34.5 34.2 33.2 34.0  RDW 14.4 14.2 14.6* 14.5 14.4    Chemistries   Recent Labs Lab 12/12/14 0035 12/12/14 0607 12/13/14 0129 12/14/14 0512 12/15/14 0518  NA 112* 113* 121* 124* 129*  K 3.1* 3.0* 3.3* 2.9* 4.2  CL 74* 79* 90* 89* 94*  CO2 26 27 24 28 27   GLUCOSE 103* 99 94 156* 143*  BUN 7 7 12 11 18   CREATININE 0.91 0.88 0.98 1.08 0.99  CALCIUM 8.5* 7.8* 7.6* 8.1* 8.5*  MG  --   --  1.5* 1.4* 1.9   ------------------------------------------------------------------------------------------------------------------ estimated creatinine clearance is 59.3 mL/min (by C-G formula based on Cr of 0.99). ------------------------------------------------------------------------------------------------------------------ No results for input(s): HGBA1C in the last 72 hours. ------------------------------------------------------------------------------------------------------------------ No results for input(s): CHOL, HDL, LDLCALC, TRIG, CHOLHDL, LDLDIRECT in the last 72 hours. ------------------------------------------------------------------------------------------------------------------ No results for input(s): TSH, T4TOTAL, T3FREE, THYROIDAB in the last 72 hours.  Invalid input(s): FREET3 ------------------------------------------------------------------------------------------------------------------ No results for input(s): VITAMINB12, FOLATE, FERRITIN, TIBC, IRON, RETICCTPCT in the last 72 hours.  Coagulation profile No results for input(s): INR, PROTIME in the last 168 hours.  No results for input(s): DDIMER in the last 72 hours.  Cardiac Enzymes No results for input(s): CKMB, TROPONINI, MYOGLOBIN in the last 168 hours.  Invalid input(s): CK ------------------------------------------------------------------------------------------------------------------ Invalid input(s):  [place]     1. Acute respiratory failure- cxr with b/l infiltrate, s/p tx with lasix with improvement in resp sx, also suspected aspiration   pna- improved, change to po agumetin tomm   2.  Seizure : Due to etoh withdrawal sz,continue CIWA protocol. Monitor for any further sign and sx of dt's   3. Alcohol abuse : ciwa protocol   4.  Hyponatremia: due to dehydration and beer potomania- na improving, ivf stopped   5. Essential hypertension continue amlodopine.    6.  COPD (chronic obstructive pulmonary disease) on solumedrol change to predinsone in am   7. Hypokalemia being replaced   8.  hypomageniusm- replaced      Code Status Orders        Start     Ordered   12/12/14 0119  Full code   Continuous     12/12/14 0119      Family Communication: d/w daughter  Disposition Plan:  Home        DVT Prophylaxis    SCDs  Lab Results  Component Value Date   PLT 156 12/14/2014     Time Spent in minutes  41min  Victoriya Pol M.D on 12/15/2014 at 2:20 PM  Between 7am to 6pm - Pager - [phone]  After 6pm go to [email or web address] - password [email or web address] [facility]   Office  [phone]

## 2014-12-16 MED ORDER — PREDNISONE 10 MG (21) PO TBPK
20.0000 mg | ORAL_TABLET | Freq: Every evening | ORAL | Status: AC
  Administered 2014-12-16: 20 mg via ORAL

## 2014-12-16 MED ORDER — PREDNISONE 10 MG (21) PO TBPK: 10.0000 mg | ORAL_TABLET | Freq: Four times a day (QID) | ORAL | Status: DC

## 2014-12-16 MED ORDER — PREDNISONE 10 MG (21) PO TBPK
10.0000 mg | ORAL_TABLET | ORAL | Status: AC
  Administered 2014-12-16: 10 mg via ORAL

## 2014-12-16 MED ORDER — AMOXICILLIN-POT CLAVULANATE 875-125 MG PO TABS
1.0000 | ORAL_TABLET | Freq: Two times a day (BID) | ORAL | Status: DC
  Administered 2014-12-16 – 2014-12-17 (×4): 1 via ORAL
  Filled 2014-12-16 (×4): qty 1

## 2014-12-16 MED ORDER — PREDNISONE 10 MG (21) PO TBPK
10.0000 mg | ORAL_TABLET | Freq: Three times a day (TID) | ORAL | Status: AC
  Administered 2014-12-17 (×3): 10 mg via ORAL

## 2014-12-16 MED ORDER — PREDNISONE 10 MG (21) PO TBPK
20.0000 mg | ORAL_TABLET | Freq: Every evening | ORAL | Status: AC
  Administered 2014-12-17: 20 mg via ORAL

## 2014-12-16 MED ORDER — PREDNISONE 10 MG (21) PO TBPK
20.0000 mg | ORAL_TABLET | Freq: Every morning | ORAL | Status: AC
  Administered 2014-12-16: 20 mg via ORAL
  Filled 2014-12-16: qty 21

## 2014-12-16 MED ORDER — AMLODIPINE BESYLATE 10 MG PO TABS
10.0000 mg | ORAL_TABLET | Freq: Every day | ORAL | Status: DC
  Administered 2014-12-17: 10 mg via ORAL
  Filled 2014-12-16: qty 1

## 2014-12-16 NOTE — Progress Notes (Signed)
[place] at [facility]                                                                                                                                                                                            Patient Demographics   Thomas [name], is a 64 y.o. male, DOB - [birth year], ZOX:[id number]  Admit date - 12/11/2014   Admitting Physician Lytle Butte, MD  Outpatient Primary MD for the patient is PROVIDER NOT IN SYSTEM    Chief Complaint  Patient presents with  . Seizures    pt little more awake, states could not sleep  Review of Systems:     CONSTITUTIONAL: No documented fever. No fatigue, weakness. No weight gain, no weight loss.  EYES: No blurry or double vision.  ENT: No tinnitus. No postnasal drip. No redness of the oropharynx.  RESPIRATORY: No cough, no wheeze, no hemoptysis. No dyspnea.  CARDIOVASCULAR: No chest pain. No orthopnea. No palpitations. No syncope.  GASTROINTESTINAL: No nausea, no vomiting or diarrhea. No abdominal pain. No melena or hematochezia.  GENITOURINARY: No dysuria or hematuria.  ENDOCRINE: No polyuria or nocturia. No heat or cold intolerance.  HEMATOLOGY: No anemia. No bruising. No bleeding.  INTEGUMENTARY: No rashes. No lesions.  MUSCULOSKELETAL: No arthritis. No swelling. No gout.  NEUROLOGIC: No numbness, tingling, or ataxia. No seizure-type activity.  PSYCHIATRIC: No anxiety. No insomnia. No ADD.     Vitals:   Filed Vitals:   12/15/14 0744 12/15/14 1556 12/15/14 2129 12/16/14 0818  BP: 157/77 141/71 158/80 174/73  Pulse: 75 80 81 65  Temp: 98 F (36.7 C) 98 F (36.7 C) 98.3 F (36.8 C) 98.1 F (36.7 C)  TempSrc: Oral Axillary Oral Oral  Resp: 17 17  18   Height:      Weight:      SpO2: 100% 99% 99% 98%    Wt Readings from Last 3 Encounters:  12/12/14 55.566 kg (122 lb 8 oz)  06/02/14 55.084 kg (121 lb 7 oz)     Intake/Output Summary (Last 24 hours) at 12/16/14 1321 Last data filed at  12/16/14 1124  Gross per 24 hour  Intake 1048.38 ml  Output   1975 ml  Net -926.62 ml    Physical Exam:   GENERAL:  Decrease in responsiveness HEAD, EYES, EARS, NOSE AND THROAT: Atraumatic, normocephalic. Extraocular muscles are intact. Pupils equal and reactive to light. Sclerae anicteric. No conjunctival injection. No oro-pharyngeal erythema.  NECK: Supple. There is no jugular venous distention. No bruits, no lymphadenopathy, no thyromegaly.  HEART: Regular rate and rhythm, tachycardic. No murmurs, no rubs, no clicks.  LUNGS:   No accesory muscle usage , no wheezing or ronchi ABDOMEN: Soft, flat, nontender, nondistended. Has good bowel sounds. No hepatosplenomegaly appreciated.  EXTREMITIES: No evidence of any cyanosis, clubbing, or peripheral edema.  +2 pedal and radial pulses bilaterally.  NEUROLOGIC:  No focal deficits, cn 2-12 grosely intact SKIN: Moist and warm with no rashes appreciated.     Antibiotics   Anti-infectives    Start     Dose/Rate Route Frequency Ordered Stop   12/16/14 1330  amoxicillin-clavulanate (AUGMENTIN) 875-125 MG per tablet 1 tablet     1 tablet Oral Every 12 hours 12/16/14 1319     12/13/14 1115  Ampicillin-Sulbactam (UNASYN) 3 g in sodium chloride 0.9 % 100 mL IVPB  Status:  Discontinued     3 g 100 mL/hr over 60 Minutes Intravenous Every 6 hours 12/13/14 1034 12/16/14 1319      Medications   Scheduled Meds: . [START ON 12/17/2014] amLODipine  10 mg Oral Daily  . amoxicillin-clavulanate  1 tablet Oral Q12H  . budesonide-formoterol  2 puff Inhalation BID  . DULoxetine  30 mg Oral Daily  . famotidine  20 mg Oral BID  . feeding supplement (ENSURE ENLIVE)  237 mL Oral TID WC  . gabapentin  600 mg Oral BID  . heparin  5,000 Units Subcutaneous 3 times per day  . levothyroxine  100 mcg Oral Daily  . LORazepam  0-4 mg Intravenous Q12H  . LORazepam  2 mg Intravenous Once  . methylPREDNISolone (SOLU-MEDROL) injection  60 mg Intravenous Q24H  .  montelukast  10 mg Oral Daily  . pneumococcal 23 valent vaccine  0.5 mL Intramuscular Tomorrow-1000  . potassium chloride  20 mEq Oral Daily  . sodium chloride  3 mL Intravenous Q12H  . tamsulosin  0.4 mg Oral Daily  . tiotropium  1 capsule Inhalation Daily   Continuous Infusions:   PRN Meds:.acetaminophen **OR** acetaminophen, albuterol, LORazepam **OR** LORazepam, nicotine, [DISCONTINUED] ondansetron **OR** ondansetron (ZOFRAN) IV, ondansetron   Data Review:   Micro Results Recent Results (from the past 240 hour(s))  Culture, blood (routine x 2)     Status: None (Preliminary result)   Collection Time: 12/13/14  1:28 AM  Result Value Ref Range Status   Specimen Description BLOOD  Final   Special Requests NONE  Final   Culture NO GROWTH 2 DAYS  Final   Report Status PENDING  Incomplete  Culture, blood (routine x 2)     Status: None (Preliminary result)   Collection Time: 12/13/14  1:29 AM  Result Value Ref Range Status   Specimen Description BLOOD  Final   Special Requests NONE  Final   Culture NO GROWTH 2 DAYS  Final   Report Status PENDING  Incomplete    Radiology Reports Ct Head Wo Contrast  12/12/2014   CLINICAL DATA:  Seizures.  EXAM: CT HEAD WITHOUT CONTRAST  TECHNIQUE: Contiguous axial images were obtained from the base of the skull through the vertex without intravenous contrast.  COMPARISON:  02/19/2013  FINDINGS: Mild cerebral atrophy is unchanged. There is no evidence of acute cortical infarct, intracranial hemorrhage, mass, midline shift, or extra-axial fluid collection. 3 mm hypodensity in the right putamen is more conspicuous than on the prior study but is likely unchanged and may represent a chronic lacunar infarct or dilated perivascular space.  Visualized portions of the orbits are unremarkable. There are small to moderate right and small left mastoid effusions. The visualized paranasal sinuses are clear. Moderate carotid siphon  calcification is noted bilaterally.   IMPRESSION: 1. No evidence of acute intracranial abnormality. 2. Bilateral mastoid effusions.   Electronically Signed   By: Logan Bores   On: 12/12/2014 00:30   Dg Chest Port 1 View  12/13/2014   CLINICAL DATA:  Lung crackles  EXAM: PORTABLE CHEST - 1 VIEW  COMPARISON:  10/22/2010  FINDINGS: There is mild vascular prominence and interstitial thickening. There is ground-glass opacity in the central and basilar regions bilaterally. This may represent congestive heart failure. No large effusion is evident.  IMPRESSION: Probable congestive heart failure   Electronically Signed   By: Andreas Newport M.D.   On: 12/13/2014 02:06     CBC  Recent Labs Lab 12/12/14 0035 12/12/14 0607 12/13/14 0129 12/13/14 1104 12/14/14 0512  WBC 9.3 7.4 13.3* 13.6* 12.4*  HGB 12.2* 11.4* 10.7* 10.5* 10.3*  HCT 35.2* 33.0* 31.3* 31.8* 30.4*  PLT 226 205 177 173 156  MCV 93.1 93.4 94.2 96.2 94.6  MCH 32.3 32.2 32.2 31.9 32.1  MCHC 34.7 34.5 34.2 33.2 34.0  RDW 14.4 14.2 14.6* 14.5 14.4    Chemistries   Recent Labs Lab 12/12/14 0035 12/12/14 0607 12/13/14 0129 12/14/14 0512 12/15/14 0518  NA 112* 113* 121* 124* 129*  K 3.1* 3.0* 3.3* 2.9* 4.2  CL 74* 79* 90* 89* 94*  CO2 26 27 24 28 27   GLUCOSE 103* 99 94 156* 143*  BUN 7 7 12 11 18   CREATININE 0.91 0.88 0.98 1.08 0.99  CALCIUM 8.5* 7.8* 7.6* 8.1* 8.5*  MG  --   --  1.5* 1.4* 1.9   ------------------------------------------------------------------------------------------------------------------ estimated creatinine clearance is 59.3 mL/min (by C-G formula based on Cr of 0.99). ------------------------------------------------------------------------------------------------------------------ No results for input(s): HGBA1C in the last 72 hours. ------------------------------------------------------------------------------------------------------------------ No results for input(s): CHOL, HDL, LDLCALC, TRIG, CHOLHDL, LDLDIRECT in the last 72  hours. ------------------------------------------------------------------------------------------------------------------ No results for input(s): TSH, T4TOTAL, T3FREE, THYROIDAB in the last 72 hours.  Invalid input(s): FREET3 ------------------------------------------------------------------------------------------------------------------ No results for input(s): VITAMINB12, FOLATE, FERRITIN, TIBC, IRON, RETICCTPCT in the last 72 hours.  Coagulation profile No results for input(s): INR, PROTIME in the last 168 hours.  No results for input(s): DDIMER in the last 72 hours.  Cardiac Enzymes No results for input(s): CKMB, TROPONINI, MYOGLOBIN in the last 168 hours.  Invalid input(s): CK ------------------------------------------------------------------------------------------------------------------ Invalid input(s): [place]     1. Acute respiratory failure- cxr with b/l infiltrate, s/p tx with lasix with improvement in resp sx, also suspected aspiration   pna- improved, agumetin   2.  Seizure : Due to etoh withdrawal sz,continue CIWA protocol. Monitor for any further sign and sx of dt's   3. Alcohol abuse : ciwa protocol   4.  Hyponatremia: due to dehydration and beer potomania-  Na improved   5. Essential hypertension bp high increase norvasc   6.  COPD (chronic obstructive pulmonary disease) predinosone taper   7. Hypokalemia being replaced   8.  hypomageniusm- replaced      Code Status Orders        Start     Ordered   12/12/14 0119  Full code   Continuous     12/12/14 0119      Family Communication: d/w daughter  Disposition Plan:  Home        DVT Prophylaxis    SCDs  Lab Results  Component Value Date   PLT 156 12/14/2014     Time Spent in minutes  37min  Dustin Flock M.D on 12/16/2014 at 1:21 PM  Between 7am to 6pm - Pager - [phone]  After 6pm go to [email or web address] - password [email or web address] [facility]    Office  [phone]

## 2014-12-16 NOTE — Progress Notes (Signed)
Nutrition Follow-up  DOCUMENTATION CODES:     INTERVENTION: Meals and Snacks: Cater to patient preferences Medical Food Supplement Therapy: will recommend contiuning Ensure Enlive (each supplement provides 350kcal and 20 grams of protein) BID as pt drinking Coordination of Care: pt may benefit from stronger bowel regimen as day 4 no BM  NUTRITION DIAGNOSIS:  Inadequate oral intake related to acute illness as evidenced by energy intake < or equal to 50% for > or equal to 5 days; improving  GOAL:  Patient will meet greater than or equal to 90% of their needs  MONITOR:  Energy Intake Electrolyte and Renal Profile Anthropometrics Digestive system  REASON FOR ASSESSMENT:  Malnutrition Screening Tool    ASSESSMENT:  Per MD note, pt with improved pna; continues on CIWA. Pt sleepy this am on visit.  PO Intake: Recorded po intake 100% of breakfast this am, 75-100% of meals yesterday. Pt with open ensure on tray table in front of him on visit.  Medications: KCl, prednisone  Labs: Electrolyte and Renal Profile:    Recent Labs Lab 12/13/14 0129 12/14/14 0512 12/15/14 0518  BUN 12 11 18   CREATININE 0.98 1.08 0.99  NA 121* 124* 129*  K 3.3* 2.9* 4.2  MG 1.5* 1.4* 1.9   Glucose Profile:  Recent Labs  12/13/14 1602  GLUCAP 97  Protein Profile: No results for input(s): ALBUMIN in the last 168 hours.  Height:  Ht Readings from Last 1 Encounters:  12/12/14 5\' 8"  (1.727 m)    Weight:  Wt Readings from Last 1 Encounters:  12/12/14 122 lb 8 oz (55.566 kg)    Ideal Body Weight:     Wt Readings from Last 10 Encounters:  12/12/14 122 lb 8 oz (55.566 kg)  06/02/14 121 lb 7 oz (55.084 kg)    BMI:  Body mass index is 18.63 kg/(m^2).  Estimated Nutritional Needs:  Kcal:  1736-2052kcals, BEE: 1315kcals (IF 1.01-1.3)(AF 1.2)  Protein:  56-66g protein (1.0-1.2g/kg)  Fluid:  1390-1663mL of fluid (25-8mL/kg)  Diet Order:  Diet regular Room service  appropriate?: Yes; Fluid consistency:: Thin  EDUCATION NEEDS:  Education needs no appropriate at this time   Intake/Output Summary (Last 24 hours) at 12/16/14 1409 Last data filed at 12/16/14 1124  Gross per 24 hour  Intake 1048.38 ml  Output   1975 ml  Net -926.62 ml    Last BM:  5/7  LOW Care Level  Dwyane Luo, RD, LDN Pager [phone]

## 2014-12-16 NOTE — Progress Notes (Signed)
Patient A/O, no noted distress. CIWA O, patient did not need ativan. Patient slept well through out the night. Continues to use urinal. Needs stand by assist when standing up to use urinal, no some unsteadiness. Tolerated meds well. Continue to be on ABT. Staff will continue to monitor and meet needs.

## 2014-12-17 LAB — BASIC METABOLIC PANEL
Anion gap: 7 (ref 5–15)
BUN: 22 mg/dL — AB (ref 6–20)
CALCIUM: 8.6 mg/dL — AB (ref 8.9–10.3)
CO2: 29 mmol/L (ref 22–32)
Chloride: 89 mmol/L — ABNORMAL LOW (ref 101–111)
Creatinine, Ser: 1.03 mg/dL (ref 0.61–1.24)
GFR calc Af Amer: >60 mL/min (ref >60)
GFR calc non Af Amer: >60 mL/min (ref >60)
Glucose, Bld: 139 mg/dL — ABNORMAL HIGH (ref 65–99)
Potassium: 3.4 mmol/L — ABNORMAL LOW (ref 3.5–5.1)
Sodium: 125 mmol/L — ABNORMAL LOW (ref 135–145)

## 2014-12-17 MED ORDER — NICOTINE 10 MG IN INHA
1.0000 | RESPIRATORY_TRACT | Status: DC | PRN
  Filled 2014-12-17: qty 36

## 2014-12-17 MED ORDER — SODIUM CHLORIDE 1 G PO TABS
2.0000 g | ORAL_TABLET | Freq: Three times a day (TID) | ORAL | Status: DC
  Administered 2014-12-17 (×2): 2 g via ORAL
  Filled 2014-12-17 (×6): qty 2

## 2014-12-17 MED ORDER — POTASSIUM CHLORIDE CRYS ER 20 MEQ PO TBCR
20.0000 meq | EXTENDED_RELEASE_TABLET | Freq: Once | ORAL | Status: AC
  Administered 2014-12-17: 20 meq via ORAL
  Filled 2014-12-17: qty 1

## 2014-12-17 MED FILL — Nicotine Inhaler System 10 MG (4 MG Delivered): RESPIRATORY_TRACT | Qty: 36 | Status: AC

## 2014-12-17 NOTE — Progress Notes (Addendum)
Patient A/O, no noted distress/ Patient denies pain. Slept through out the night . Staff will continue to monitor and meet patient. He tolerated meds well. CIWA remains 0

## 2014-12-17 NOTE — Evaluation (Signed)
Physical Therapy Evaluation Patient Details Name: Thomas [name] MRN: [id number] DOB: 1951-01-07 Today's Date: 12/17/2014   History of Present Illness  Pt is 64 yo male admitted for seizures and acute respiratory failure, with history of tongue CA, EtOH abuse and hyponatremia.  Has family and caregivers at home with him  Clinical Impression  Pt was seen for evaluation of his functional status with limited LE strenght and balance changes along with low endurance creating issues.  Since pt at home with caregivers he should be able to make the transition from SNF in a timely way but is too unsafe with too limited a presentation to go initially.  Will focus on gait endurance and balance to precede home.    Follow Up Recommendations SNF    Equipment Recommendations  Rolling walker with 5" wheels    Recommendations for Other Services       Precautions / Restrictions Precautions Precautions: Fall Precaution Comments: telemetry Restrictions Weight Bearing Restrictions: No      Mobility  Bed Mobility Overal bed mobility: Modified Independent             General bed mobility comments: uses HOB elevated to get up but back in with bed level  Transfers Overall transfer level: Modified independent               General transfer comment: reminders for safety  Ambulation/Gait Ambulation/Gait assistance: Min guard;Min assist Ambulation Distance (Feet): 150 Feet Assistive device: 1 person hand held assist Gait Pattern/deviations: Step-through pattern;Decreased stride length;Decreased dorsiflexion - right;Decreased dorsiflexion - left;Trunk flexed;Wide base of support Gait velocity: reduced Gait velocity interpretation: Below normal speed for age/gender General Gait Details: step through with some steadying on LUE due to generalized low endurance from limited intake over the last couple weeks  Stairs            Wheelchair Mobility    Modified Rankin (Stroke Patients  Only)       Balance Overall balance assessment: Needs assistance           Standing balance-Leahy Scale: Fair Standing balance comment: fair- dynamic                             Pertinent Vitals/Pain Pain Assessment: No/denies pain    Home Living Family/patient expects to be discharged to:: Private residence Living Arrangements: Children;Non-relatives/Friends Available Help at Discharge: Personal care attendant;Family Type of Home: House Home Access: Stairs to enter Entrance Stairs-Rails: Right;Left;Can reach both Technical brewer of Steps: 4 Home Layout: One level Home Equipment: Shower seat Additional Comments: has talked about unsteady gait but not using AD    Prior Function Level of Independence: Needs assistance   Gait / Transfers Assistance Needed: walks with no AD  ADL's / Homemaking Assistance Needed: caregivers        Hand Dominance        Extremity/Trunk Assessment   Upper Extremity Assessment: Overall WFL for tasks assessed           Lower Extremity Assessment: Overall WFL for tasks assessed      Cervical / Trunk Assessment: Normal  Communication   Communication: HOH;Expressive difficulties;Other (comment) (mouth CA surgery)  Cognition Arousal/Alertness: Awake/alert Behavior During Therapy: WFL for tasks assessed/performed Overall Cognitive Status: History of cognitive impairments - at baseline       Memory: Decreased short-term memory              General Comments General comments (skin  integrity, edema, etc.): Pt is hoping to be back home soon, but does talk about not having eaten and not caring for himself prior to coming to hospital    Exercises        Assessment/Plan    PT Assessment Patient needs continued PT services  PT Diagnosis Generalized weakness;Abnormality of gait   PT Problem List Decreased strength;Decreased range of motion;Decreased activity tolerance;Decreased balance;Decreased  mobility;Decreased coordination;Decreased cognition;Decreased knowledge of use of DME;Decreased safety awareness;Cardiopulmonary status limiting activity  PT Treatment Interventions DME instruction;Stair training;Gait training;Functional mobility training;Therapeutic activities;Therapeutic exercise;Balance training;Neuromuscular re-education;Patient/family education;Cognitive remediation   PT Goals (Current goals can be found in the Care Plan section) Acute Rehab PT Goals Patient Stated Goal: To go home  PT Goal Formulation: With patient Time For Goal Achievement: 12/31/14 Potential to Achieve Goals: Good    Frequency Min 2X/week   Barriers to discharge Inaccessible home environment stairs to safely navigate home    Co-evaluation               End of Session Equipment Utilized During Treatment: Gait belt Activity Tolerance: Patient limited by lethargy;Patient limited by fatigue Patient left: in bed;with call bell/phone within reach;with bed alarm set Nurse Communication: Mobility status;Other (comment) (requests to take a bath)         Time: 0947-0962 PT Time Calculation (min) (ACUTE ONLY): 30 min   Charges:   PT Evaluation $Initial PT Evaluation Tier I: 1 Procedure PT Treatments $Gait Training: 8-22 mins   PT G Codes:        Ramond Dial Dec 21, 2014, 3:32 PM  Mee Hives, PT MS Acute Rehab Dept. Number: [phone] [id number] and [place] [phone]

## 2014-12-17 NOTE — Progress Notes (Signed)
[place] at [facility]                                                                                                                                                                                            Patient Demographics   Santiago Stenzel, is a 64 y.o. male, DOB - 28-Mar-1951, CHY:[id number]  Admit date - 12/11/2014   Admitting Physician Lytle Butte, MD  Outpatient Primary MD for the patient is PROVIDER NOT IN SYSTEM    Chief Complaint  Patient presents with  . Seizures    pt has weakness and nausea.  Review of Systems:     CONSTITUTIONAL: No documented fever. has weakness. No weight gain, no weight loss.  EYES: No blurry or double vision.  ENT: No tinnitus. No postnasal drip. No redness of the oropharynx.  RESPIRATORY: No cough, no wheeze, no hemoptysis. No dyspnea.  CARDIOVASCULAR: No chest pain. No orthopnea. No palpitations. No syncope.  GASTROINTESTINAL: has nausea, but no vomiting or diarrhea. No abdominal pain. No melena or hematochezia.  GENITOURINARY: No dysuria or hematuria.  ENDOCRINE: No polyuria or nocturia. No heat or cold intolerance.  HEMATOLOGY: No anemia. No bruising. No bleeding.  INTEGUMENTARY: No rashes. No lesions.  MUSCULOSKELETAL: No arthritis. No swelling. No gout.  NEUROLOGIC: No numbness, tingling, or ataxia. No seizure-type activity.  PSYCHIATRIC: No anxiety. No insomnia. No ADD.     Vitals:   Filed Vitals:   12/16/14 0818 12/16/14 1554 12/17/14 0037 12/17/14 0812  BP: 174/73 141/100 158/77 164/76  Pulse: 65 80 81 71  Temp: 98.1 F (36.7 C) 98.5 F (36.9 C) 98.9 F (37.2 C) 98.1 F (36.7 C)  TempSrc: Oral Oral Oral Oral  Resp: 18 18 20 17   Height:      Weight:      SpO2: 98% 99% 97% 98%    Wt Readings from Last 3 Encounters:  12/12/14 55.566 kg (122 lb 8 oz)  06/02/14 55.084 kg (121 lb 7 oz)     Intake/Output Summary (Last 24 hours) at 12/17/14 1437 Last data filed at 12/17/14 1115  Gross per 24 hour  Intake    723 ml  Output   3000 ml  Net  -2277 ml    Physical Exam:   GENERAL:  Decrease in responsiveness HEAD, EYES, EARS, NOSE AND THROAT: Atraumatic, normocephalic. Extraocular muscles are intact. Pupils equal and reactive to light. Sclerae anicteric. No conjunctival injection. No oro-pharyngeal erythema.  NECK: Supple. There is no jugular venous distention. No bruits, no lymphadenopathy, no thyromegaly.  HEART: Regular rate and rhythm, tachycardic. No murmurs, no rubs, no clicks.  LUNGS:   No accesory muscle usage , no wheezing or ronchi ABDOMEN: Soft, flat, nontender, nondistended. Has good bowel sounds. No hepatosplenomegaly appreciated.  EXTREMITIES: No evidence of any cyanosis, clubbing, or peripheral edema.  +2 pedal and radial pulses bilaterally.  NEUROLOGIC:  No focal deficits, cn 2-12 grosely intact SKIN: Moist and warm with no rashes appreciated.     Antibiotics   Anti-infectives    Start     Dose/Rate Route Frequency Ordered Stop   12/16/14 1330  amoxicillin-clavulanate (AUGMENTIN) 875-125 MG per tablet 1 tablet     1 tablet Oral Every 12 hours 12/16/14 1319     12/13/14 1115  Ampicillin-Sulbactam (UNASYN) 3 g in sodium chloride 0.9 % 100 mL IVPB  Status:  Discontinued     3 g 100 mL/hr over 60 Minutes Intravenous Every 6 hours 12/13/14 1034 12/16/14 1319      Medications   Scheduled Meds: . amLODipine  10 mg Oral Daily  . amoxicillin-clavulanate  1 tablet Oral Q12H  . budesonide-formoterol  2 puff Inhalation BID  . DULoxetine  30 mg Oral Daily  . famotidine  20 mg Oral BID  . feeding supplement (ENSURE ENLIVE)  237 mL Oral TID WC  . gabapentin  600 mg Oral BID  . heparin  5,000 Units Subcutaneous 3 times per day  . levothyroxine  100 mcg Oral Daily  . LORazepam  0-4 mg Intravenous Q12H  . LORazepam  2 mg Intravenous Once  . montelukast  10 mg Oral Daily  . pneumococcal 23 valent vaccine  0.5 mL Intramuscular Tomorrow-1000  . potassium  chloride  20 mEq Oral Daily  . predniSONE  10 mg Oral 3 x daily with food  . [START ON 12/18/2014] predniSONE  10 mg Oral 4X daily taper  . predniSONE  20 mg Oral Nightly  . sodium chloride  3 mL Intravenous Q12H  . sodium chloride  2 g Oral TID WC  . tamsulosin  0.4 mg Oral Daily  . tiotropium  1 capsule Inhalation Daily   Continuous Infusions:   PRN Meds:.acetaminophen **OR** acetaminophen, albuterol, nicotine, [DISCONTINUED] ondansetron **OR** ondansetron (ZOFRAN) IV, ondansetron   Data Review:   Micro Results Recent Results (from the past 240 hour(s))  Culture, blood (routine x 2)     Status: None (Preliminary result)   Collection Time: 12/13/14  1:28 AM  Result Value Ref Range Status   Specimen Description BLOOD  Final   Special Requests NONE  Final   Culture NO GROWTH 4 DAYS  Final   Report Status PENDING  Incomplete  Culture, blood (routine x 2)     Status: None (Preliminary result)   Collection Time: 12/13/14  1:29 AM  Result Value Ref Range Status   Specimen Description BLOOD  Final   Special Requests NONE  Final   Culture NO GROWTH 4 DAYS  Final   Report Status PENDING  Incomplete    Radiology Reports Ct Head Wo Contrast  12/12/2014   CLINICAL DATA:  Seizures.  EXAM: CT HEAD WITHOUT CONTRAST  TECHNIQUE: Contiguous axial images were obtained from the base of the skull through the vertex without intravenous contrast.  COMPARISON:  02/19/2013  FINDINGS: Mild cerebral atrophy is unchanged. There is no evidence of acute cortical infarct, intracranial hemorrhage, mass, midline shift, or extra-axial fluid collection. 3 mm hypodensity in the right putamen is more conspicuous than on the prior study but is likely unchanged and may represent a chronic lacunar infarct or dilated perivascular space.  Visualized portions of the orbits are unremarkable. There are small to moderate right and small left mastoid effusions. The visualized paranasal sinuses are clear. Moderate carotid siphon  calcification is noted bilaterally.  IMPRESSION: 1. No evidence of acute intracranial abnormality. 2. Bilateral mastoid effusions.   Electronically Signed   By: Logan Bores   On: 12/12/2014 00:30   Dg Chest Port 1 View  12/13/2014   CLINICAL DATA:  Lung crackles  EXAM: PORTABLE CHEST - 1 VIEW  COMPARISON:  10/22/2010  FINDINGS: There is mild vascular prominence and interstitial thickening. There is ground-glass opacity in the central and basilar regions bilaterally. This may represent congestive heart failure. No large effusion is evident.  IMPRESSION: Probable congestive heart failure   Electronically Signed   By: Andreas Newport M.D.   On: 12/13/2014 02:06     CBC  Recent Labs Lab 12/12/14 0035 12/12/14 0607 12/13/14 0129 12/13/14 1104 12/14/14 0512  WBC 9.3 7.4 13.3* 13.6* 12.4*  HGB 12.2* 11.4* 10.7* 10.5* 10.3*  HCT 35.2* 33.0* 31.3* 31.8* 30.4*  PLT 226 205 177 173 156  MCV 93.1 93.4 94.2 96.2 94.6  MCH 32.3 32.2 32.2 31.9 32.1  MCHC 34.7 34.5 34.2 33.2 34.0  RDW 14.4 14.2 14.6* 14.5 14.4    Chemistries   Recent Labs Lab 12/12/14 0607 12/13/14 0129 12/14/14 0512 12/15/14 0518 12/17/14 0555  NA 113* 121* 124* 129* 125*  K 3.0* 3.3* 2.9* 4.2 3.4*  CL 79* 90* 89* 94* 89*  CO2 27 24 28 27 29   GLUCOSE 99 94 156* 143* 139*  BUN 7 12 11 18  22*  CREATININE 0.88 0.98 1.08 0.99 1.03  CALCIUM 7.8* 7.6* 8.1* 8.5* 8.6*  MG  --  1.5* 1.4* 1.9  --    ------------------------------------------------------------------------------------------------------------------ estimated creatinine clearance is 57 mL/min (by C-G formula based on Cr of 1.03). ------------------------------------------------------------------------------------------------------------------ No results for input(s): HGBA1C in the last 72 hours. ------------------------------------------------------------------------------------------------------------------ No results for input(s): CHOL, HDL, LDLCALC, TRIG,  CHOLHDL, LDLDIRECT in the last 72 hours. ------------------------------------------------------------------------------------------------------------------ No results for input(s): TSH, T4TOTAL, T3FREE, THYROIDAB in the last 72 hours.  Invalid input(s): FREET3 ------------------------------------------------------------------------------------------------------------------ No results for input(s): VITAMINB12, FOLATE, FERRITIN, TIBC, IRON, RETICCTPCT in the last 72 hours.  Coagulation profile No results for input(s): INR, PROTIME in the last 168 hours.  No results for input(s): DDIMER in the last 72 hours.  Cardiac Enzymes No results for input(s): CKMB, TROPONINI, MYOGLOBIN in the last 168 hours.  Invalid input(s): CK ------------------------------------------------------------------------------------------------------------------ Invalid input(s): [place]     1. Acute respiratory failure- cxr with b/l infiltrate, s/p tx with lasix with improvement in resp sx, also suspected aspiration   pna- improved, cont agumetin   2.  Seizure : Due to etoh withdrawal sz,continue CIWA protocol. Monitor for any further sign and sx of dt's   3. Alcohol abuse : ciwa protocol   4.  Hyponatremia: due to dehydration and beer potomania-  Na worsening. Give salt tab, f/u BMP.   5. Essential hypertension bp high increase norvasc   6.  COPD (chronic obstructive pulmonary disease) predinosone taper   7. Hypokalemia being replaced, f/u BMP.   8.  hypomageniusm- replaced, improved.      Code Status Orders        Start     Ordered   12/12/14 0119  Full code   Continuous     12/12/14 0119      Family Communication:  Disposition Plan:  Home  DVT Prophylaxis    SCDs  Lab Results  Component Value Date   PLT 156 12/14/2014     Time Spent in minutes  38  min  Demetrios Loll M.D on 12/17/2014 at 2:37 PM  Between 7am to 6pm - Pager - [phone]  After 6pm go to  [email or web address] - password [email or web address] [facility]   Office  [phone]

## 2014-12-18 LAB — BASIC METABOLIC PANEL
ANION GAP: 9 (ref 5–15)
BUN: 22 mg/dL — ABNORMAL HIGH (ref 6–20)
CO2: 29 mmol/L (ref 22–32)
CREATININE: 1.09 mg/dL (ref 0.61–1.24)
Calcium: 9.2 mg/dL (ref 8.9–10.3)
Chloride: 89 mmol/L — ABNORMAL LOW (ref 101–111)
GFR calc Af Amer: >60 mL/min (ref >60)
GLUCOSE: 125 mg/dL — AB (ref 65–99)
Potassium: 3.6 mmol/L (ref 3.5–5.1)
Sodium: 127 mmol/L — ABNORMAL LOW (ref 135–145)

## 2014-12-18 LAB — CULTURE, BLOOD (ROUTINE X 2)
Culture: NO GROWTH
Culture: NO GROWTH

## 2014-12-18 LAB — CBC
HCT: 35.1 % — ABNORMAL LOW (ref 40.0–52.0)
Hemoglobin: 11.9 g/dL — ABNORMAL LOW (ref 13.0–18.0)
MCH: 32.1 pg (ref 26.0–34.0)
MCHC: 33.9 g/dL (ref 32.0–36.0)
MCV: 94.5 fL (ref 80.0–100.0)
Platelets: 280 10*3/uL (ref 150–440)
RBC: 3.71 MIL/uL — AB (ref 4.40–5.90)
RDW: 14.5 % (ref 11.5–14.5)
WBC: 5.6 10*3/uL (ref 3.8–10.6)

## 2014-12-18 MED ORDER — PREDNISONE 10 MG (21) PO TBPK: 10.0000 mg | ORAL_TABLET | Freq: Every day | ORAL | Status: DC

## 2014-12-18 MED ORDER — AMOXICILLIN-POT CLAVULANATE 875-125 MG PO TABS: 1.0000 | ORAL_TABLET | Freq: Two times a day (BID) | ORAL | Status: DC

## 2014-12-18 NOTE — Progress Notes (Signed)
Pt A&O. Given discharge summary and information. Given prescriptions. Concerns addressed. IV site removed by nurse aid Dalbert Mayotte.  Pt picked up by family member. Wheeled down in wheelchair with auxilary.

## 2014-12-18 NOTE — Discharge Summary (Signed)
[place] [facility] [place] NAME: Thomas [name]    MR#:  [id number]  DATE OF BIRTH:  04-18-1951  DATE OF ADMISSION:  12/11/2014 ADMITTING PHYSICIAN: Lytle Butte, MD  DATE OF DISCHARGE: 12/18/2014  1:58 PM  PRIMARY CARE PHYSICIAN: PROVIDER NOT IN SYSTEM    ADMISSION DIAGNOSIS:  Hyponatremia [E87.1]   DISCHARGE DIAGNOSIS:  1. Acute respiratory failure, aspiration pneumonia.  2. Seizure : Due to etoh withdrawal.  3. Alcohol abuse   4. Hyponatremia: due to dehydration and beer potomania  5. Essential hypertension  6. COPD (chronic obstructive pulmonary disease)   SECONDARY DIAGNOSIS:   Past Medical History  Diagnosis Date  . Hypertension   . Cancer of nasal cavities   . Enlarged prostate   . Tongue cancer   . Renal disorder   . Lung infection     no lung cancer  . Radiation     to neck  . Status post chemotherapy     HOSPITAL COURSE:  Thomas [name] is a 64 y.o. male with a known history of alcohol abuse. He usually drinks 440 ounce beers daily, however the last few days he's cut down to 1 beer daily. His daughter states that he appeared somewhat more confused than baseline for 1 day total. He subsequently had a witnessed seizure, tonic-clonic lasting approximately 1 minute with positive post ictal confusion. Brought to the emergency department no focal neurological deficits but remains lethargic. Incidentally found to have low sodium.  1. Acute respiratory failure due to aspirationpna: The patient has been treated with augmetin and will continue for 7 more days after discharge.  2. Seizure : Due to etoh withdrawal sz, on CIWA protocol.  No more seizure after admission.  3. Alcohol abuse : on ciwa protocol, need detox after discharge.  4. Hyponatremia: due to dehydration and beer potomania- better.  5. Essential hypertension bp high increase norvasc  6. COPD (chronic obstructive pulmonary disease), on predinosone  taper  7. Hypokalemia, K was replaced, improved.  8. hypomageniusm- replaced, improved.   DISCHARGE CONDITIONS:  Stable.  CONSULTS OBTAINED:     DRUG ALLERGIES:  No Known Allergies  DISCHARGE MEDICATIONS:   Discharge Medication List as of 12/18/2014 12:06 PM    START taking these medications   Details  amoxicillin-clavulanate (AUGMENTIN) 875-125 MG per tablet Take 1 tablet by mouth 2 (two) times daily., Starting 12/18/2014, Until Discontinued, Print      CONTINUE these medications which have CHANGED   Details  predniSONE (STERAPRED UNI-PAK 21 TAB) 10 MG (21) TBPK tablet Take 1 tablet (10 mg total) by mouth daily. 20 mg po daily for 2 days; 10 mg po daily for 2 days., Starting 12/18/2014, Until Discontinued, Print      CONTINUE these medications which have NOT CHANGED   Details  clonazePAM (KLONOPIN) 1 MG tablet Take 1 mg by mouth 3 (three) times daily as needed., Starting 03/21/2011, Until Discontinued, Historical Med    amLODipine (NORVASC) 2.5 MG tablet Take 2.5 mg by mouth daily., Starting 06/18/2014, Until Fri 06/18/15, Historical Med    azelastine (ASTELIN) 0.1 % nasal spray Place 1 spray into the nose 2 (two) times daily., Starting 07/07/2014, Until Wed 07/07/15, Historical Med    budesonide-formoterol (SYMBICORT) 80-4.5 MCG/ACT inhaler Inhale 2 puffs into the lungs 2 (two) times daily., Starting 09/27/2011, Until Discontinued, Historical Med    DULoxetine (CYMBALTA) 30 MG capsule Take 30 mg by mouth daily., Starting 09/30/2013, Until Discontinued, Historical Med  famotidine (PEPCID) 20 MG tablet Take 20 mg by mouth 2 (two) times daily., Starting 06/24/2013, Until Discontinued, Historical Med    gabapentin (NEURONTIN) 600 MG tablet Take 600 mg by mouth 2 (two) times daily., Starting 06/16/2014, Until Wed 06/16/15, Historical Med    levothyroxine (SYNTHROID, LEVOTHROID) 100 MCG tablet Take 100 mcg by mouth daily., Starting 05/25/2014, Until Tue 05/25/15, Historical Med     montelukast (SINGULAIR) 10 MG tablet Take 10 mg by mouth daily., Starting 07/07/2014, Until Wed 07/07/15, Historical Med    Multiple Vitamin (MULTI-VITAMINS) TABS Take 1 tablet by mouth daily., Starting 06/07/2014, Until Mon 06/07/15, Historical Med    ondansetron (ZOFRAN) 8 MG tablet Take 1 tablet by mouth every 8 (eight) hours as needed. nausea and vometing, Starting 11/17/2014, Until Wed 06/16/15, Historical Med    tamsulosin (FLOMAX) 0.4 MG CAPS capsule Take 1 capsule by mouth daily., Starting 06/01/2014, Until Tue 06/01/15, Historical Med    tiotropium (SPIRIVA HANDIHALER) 18 MCG inhalation capsule Place 1 capsule into inhaler and inhale daily., Starting 09/27/2011, Until Discontinued, Historical Med         DISCHARGE INSTRUCTIONS:   Regular diet. Smoking cessation. Alcohol detox. Activity as tolerated.  If you experience worsening of your admission symptoms, develop shortness of breath, life threatening emergency, suicidal or homicidal thoughts you must seek medical attention immediately by calling 911 or calling your MD immediately  if symptoms less severe.  You Must read complete instructions/literature along with all the possible adverse reactions/side effects for all the Medicines you take and that have been prescribed to you. Take any new Medicines after you have completely understood and accept all the possible adverse reactions/side effects.   Please note  You were cared for by a hospitalist during your hospital stay. If you have any questions about your discharge medications or the care you received while you were in the hospital after you are discharged, you can call the unit and asked to speak with the hospitalist on call if the hospitalist that took care of you is not available. Once you are discharged, your primary care physician will handle any further medical issues. Please note that NO REFILLS for any discharge medications will be authorized once you are discharged, as  it is imperative that you return to your primary care physician (or establish a relationship with a primary care physician if you do not have one) for your aftercare needs so that they can reassess your need for medications and monitor your lab values.    Today   SUBJECTIVE    No complaint.  VITAL SIGNS:  Blood pressure 161/82, pulse 68, temperature 98.1 F (36.7 C), temperature source Oral, resp. rate 20, height 5\' 8"  (1.727 m), weight 55.566 kg (122 lb 8 oz), SpO2 98 %.  I/O:   Intake/Output Summary (Last 24 hours) at 12/18/14 1657 Last data filed at 12/18/14 0900  Gross per 24 hour  Intake    480 ml  Output   1400 ml  Net   -920 ml    PHYSICAL EXAMINATION:  GENERAL:  64 y.o.-year-old patient lying in the bed with no acute distress.  EYES:No scleral icterus. Extraocular muscles intact.  HEENT: Head atraumatic, normocephalic. Oropharynx and nasopharynx clear.  NECK:  Supple, no jugular venous distention. No thyroid enlargement, no tenderness.  LUNGS: Normal breath sounds bilaterally, no wheezing, rales,rhonchi or crepitation. No use of accessory muscles of respiration.  CARDIOVASCULAR: S1, S2 normal. No murmurs, rubs, or gallops.  ABDOMEN: Soft, non-tender, non-distended. Bowel sounds  present. No organomegaly or mass.  EXTREMITIES: No pedal edema, cyanosis, or clubbing.  NEUROLOGIC: Cranial nerves II through XII are intact. Muscle strength 5/5 in all extremities. Sensation intact. Gait not checked.  PSYCHIATRIC: The patient is alert and oriented x 3.  SKIN: No obvious rash, lesion, or ulcer.   DATA REVIEW:   CBC  Recent Labs Lab 12/18/14 0625  WBC 5.6  HGB 11.9*  HCT 35.1*  PLT 280    Chemistries   Recent Labs Lab 12/15/14 0518  12/18/14 0625  NA 129*  < > 127*  K 4.2  < > 3.6  CL 94*  < > 89*  CO2 27  < > 29  GLUCOSE 143*  < > 125*  BUN 18  < > 22*  CREATININE 0.99  < > 1.09  CALCIUM 8.5*  < > 9.2  MG 1.9  --   --   < > = values in this interval not  displayed.  Cardiac Enzymes No results for input(s): TROPONINI in the last 168 hours.  Microbiology Results  Results for orders placed or performed during the hospital encounter of 12/11/14  Culture, blood (routine x 2)     Status: None   Collection Time: 12/13/14  1:28 AM  Result Value Ref Range Status   Specimen Description BLOOD  Final   Special Requests NONE  Final   Culture NO GROWTH 5 DAYS  Final   Report Status 12/18/2014 FINAL  Final  Culture, blood (routine x 2)     Status: None   Collection Time: 12/13/14  1:29 AM  Result Value Ref Range Status   Specimen Description BLOOD  Final   Special Requests NONE  Final   Culture NO GROWTH 5 DAYS  Final   Report Status 12/18/2014 FINAL  Final    RADIOLOGY:  No results found.      Management plans discussed with the patient, family and they are in agreement.  CODE STATUS:     Code Status Orders        Start     Ordered   12/12/14 0119  Full code   Continuous     12/12/14 0119      TOTAL TIME TAKING CARE OF THIS PATIENT: 48 minutes.    Demetrios Loll M.D on 12/18/2014 at 4:57 PM  Between 7am to 6pm - Pager - [phone]  After 6pm go to [email or web address] - password [email or web address] [facility]  Office  [phone]  CC: Primary care physician; PROVIDER NOT IN SYSTEM

## 2014-12-18 NOTE — Progress Notes (Signed)
Patient A/O, no distress or pain. CIWA 0. Patient tolerated meds. Patient has one incontinent episode this morning. Patient continues to make improvement. Staff will continue to monitor and meet needs.

## 2014-12-18 NOTE — Clinical Social Work Note (Signed)
Clinical Social Work Assessment  Patient Details  Name: Thomas [name] MRN: [id number] Date of Birth: Jul 19, 1951  Date of referral:  12/11/14               Reason for consult:  Facility Placement                Permission sought to share information with:    Permission granted to share information::     Name::        Agency::     Relationship::     Contact Information:     Housing/Transportation Living arrangements for the past 2 months:   (home) Source of Information:  Patient Patient Interpreter Needed:  None Criminal Activity/Legal Involvement Pertinent to Current Situation/Hospitalization:  No - Comment as needed Significant Relationships:  Adult Children Lives with:  Adult Children Do you feel safe going back to the place where you live?  Yes Need for family participation in patient care:  No (Coment)  Care giving concerns:  none   Facilities manager / plan:  CSW spoke with patient today regarding physical therapy recommendations for short term rehab. Patient is ambulatory. Patient informed CSW that he was going to return home at discharge and that his daughter lives with him. CSW asked if he had a walker or cane at home and patient stated that he did not and that he would not want them because they would only get in the way.   Patient informed CSW that he drinks about 2-4 beers a day. He states that he has friends that buy it for him and sometimes he gets it himself. Patient admits that he has a drinking problem and states that it does interfere from time to time with his daily life but he is not interested in rehab at this time. Patient reports that he has been to rehab for ETOH multiple times in the past but does not have many periods of sobriety.   Nothing further for CSW to offer at this time.   Employment status:  Disabled (Comment on whether or not currently receiving Disability) Insurance information:  Medicaid In [place] PT Recommendations:    Information /  Referral to community resources:     Patient/Family's Response to care:  pleasant  Patient/Family's Understanding of and Emotional Response to Diagnosis, Current Treatment, and Prognosis:  Patient understands his current condition but is not wanting rehab a this time. Patient reports that he has stressors and that he "thinks too much" which causes him to drink but he was not wanting to elaborate with CSW at this time.   Emotional Assessment Appearance:  Appears older than stated age, Disheveled Attitude/Demeanor/Rapport:   (pleasant) Affect (typically observed):  Accepting Orientation:  Oriented to Self, Oriented to Place, Oriented to  Time, Oriented to Situation Alcohol / Substance use:  Alcohol Use Psych involvement (Current and /or in the community):     Discharge Needs  Concerns to be addressed:  Discharge Planning Concerns, Substance Abuse Concerns Readmission within the last 30 days:  No Current discharge risk:  Substance Abuse Barriers to Discharge:  No Barriers Identified   Shela Leff, LCSW 12/18/2014, 3:39 PM

## 2014-12-18 NOTE — Discharge Instructions (Signed)
Regular diet. Smoking cessation. Alcohol detox. Activity as tolerated.

## 2015-01-01 ENCOUNTER — Emergency Department
Admission: EM | Admit: 2015-01-01 | Discharge: 2015-01-01 | Disposition: A | Discharge status: Home / Self Care | Payer: Medicaid Other | Attending: Emergency Medicine | Admitting: Emergency Medicine

## 2015-01-01 DIAGNOSIS — T426X5A Adverse effect of other antiepileptic and sedative-hypnotic drugs, initial encounter: Secondary | ICD-10-CM | POA: Insufficient documentation

## 2015-01-01 DIAGNOSIS — X58XXXA Exposure to other specified factors, initial encounter: Secondary | ICD-10-CM | POA: Insufficient documentation

## 2015-01-01 DIAGNOSIS — C029 Malignant neoplasm of tongue, unspecified: Secondary | ICD-10-CM | POA: Insufficient documentation

## 2015-01-01 DIAGNOSIS — R609 Edema, unspecified: Secondary | ICD-10-CM | POA: Diagnosis not present

## 2015-01-01 DIAGNOSIS — C300 Malignant neoplasm of nasal cavity: Secondary | ICD-10-CM | POA: Diagnosis not present

## 2015-01-01 DIAGNOSIS — Z9119 Patient's noncompliance with other medical treatment and regimen: Secondary | ICD-10-CM | POA: Insufficient documentation

## 2015-01-01 DIAGNOSIS — Z79899 Other long term (current) drug therapy: Secondary | ICD-10-CM | POA: Diagnosis not present

## 2015-01-01 DIAGNOSIS — Z720 Tobacco use: Secondary | ICD-10-CM | POA: Diagnosis not present

## 2015-01-01 DIAGNOSIS — R600 Localized edema: Secondary | ICD-10-CM

## 2015-01-01 DIAGNOSIS — G620 Drug-induced polyneuropathy: Secondary | ICD-10-CM

## 2015-01-01 DIAGNOSIS — R2243 Localized swelling, mass and lump, lower limb, bilateral: Secondary | ICD-10-CM | POA: Diagnosis present

## 2015-01-01 DIAGNOSIS — Z792 Long term (current) use of antibiotics: Secondary | ICD-10-CM | POA: Insufficient documentation

## 2015-01-01 DIAGNOSIS — T451X5A Adverse effect of antineoplastic and immunosuppressive drugs, initial encounter: Secondary | ICD-10-CM

## 2015-01-01 HISTORY — DX: Unspecified malignant neoplasm of skin, unspecified: C44.90

## 2015-01-01 LAB — BASIC METABOLIC PANEL
ANION GAP: 7 (ref 5–15)
BUN: 6 mg/dL (ref 6–20)
CO2: 27 mmol/L (ref 22–32)
Calcium: 8.5 mg/dL — ABNORMAL LOW (ref 8.9–10.3)
Chloride: 89 mmol/L — ABNORMAL LOW (ref 101–111)
Creatinine, Ser: 1.18 mg/dL (ref 0.61–1.24)
GFR calc Af Amer: >60 mL/min (ref >60)
GLUCOSE: 113 mg/dL — AB (ref 65–99)
Potassium: 3.9 mmol/L (ref 3.5–5.1)
SODIUM: 123 mmol/L — AB (ref 135–145)

## 2015-01-01 LAB — CBC
HEMATOCRIT: 29 % — AB (ref 40.0–52.0)
HEMOGLOBIN: 9.9 g/dL — AB (ref 13.0–18.0)
MCH: 32.3 pg (ref 26.0–34.0)
MCHC: 34.3 g/dL (ref 32.0–36.0)
MCV: 94.2 fL (ref 80.0–100.0)
Platelets: 250 10*3/uL (ref 150–440)
RBC: 3.07 MIL/uL — AB (ref 4.40–5.90)
RDW: 14.2 % (ref 11.5–14.5)
WBC: 3.5 10*3/uL — ABNORMAL LOW (ref 3.8–10.6)

## 2015-01-01 LAB — BRAIN NATRIURETIC PEPTIDE: B NATRIURETIC PEPTIDE 5: 40 pg/mL (ref 0.0–100.0)

## 2015-01-01 MED ORDER — DIPHENHYDRAMINE-ZINC ACETATE 1-0.1 % EX CREA: TOPICAL_CREAM | Freq: Three times a day (TID) | CUTANEOUS | Status: DC | PRN

## 2015-01-01 MED ORDER — OXYCODONE-ACETAMINOPHEN 5-325 MG PO TABS: 1.0000 | ORAL_TABLET | Freq: Four times a day (QID) | ORAL | Status: DC | PRN

## 2015-01-01 NOTE — ED Notes (Signed)
States past few days increased pedal edema, some warmth, lives with daughter, has decreased etoh , but drank 40 oz few days ago

## 2015-01-01 NOTE — ED Provider Notes (Signed)
[facility] Emergency Department Provider Note  ____________________________________________  Time seen: 5:30 PM  I have reviewed the triage vital signs and the nursing notes.   HISTORY  Chief Complaint Leg Swelling    HPI Thomas [name] is a 64 y.o. male who complains of bilateral ankle swelling and pain. She is unable to explain exactly how long this is been on but it sounds like is been going on chronically. I reviewed his primary care clinic notes from Dr. Iona Beard through [facility] everywhere which does reveal that he has chronic neuropathy and is noncompliant with prescribed Neurontin therapy. No chest pain shortness of breath dizziness lightheadedness passing out fever chills nausea vomiting or diarrhea. The patient is eating and drinking normally. Pain is sharp and intermittent but frequent, radiating down the legs, no other associated aggravating or alleviating factors.     Past Medical History  Diagnosis Date  . Hypertension   . Cancer of nasal cavities   . Enlarged prostate   . Tongue cancer   . Renal disorder   . Lung infection     no lung cancer  . Radiation     to neck  . Status post chemotherapy   . Skin cancer     Patient Active Problem List   Diagnosis Date Noted  . Seizure 12/12/2014  . Alcohol abuse 12/12/2014  . Hyponatremia 12/12/2014  . Essential hypertension 12/12/2014  . COPD (chronic obstructive pulmonary disease) 12/12/2014    Past Surgical History  Procedure Laterality Date  . Cancerous lymph nodes removed from neck    . Knee surgery Right   . Skin biopsy      Current Outpatient Rx  Name  Route  Sig  Dispense  Refill  . amLODipine (NORVASC) 2.5 MG tablet   Oral   Take 2.5 mg by mouth daily.         Marland Kitchen amoxicillin-clavulanate (AUGMENTIN) 875-125 MG per tablet   Oral   Take 1 tablet by mouth 2 (two) times daily.   7 tablet   0   . azelastine (ASTELIN) 0.1 % nasal spray   Nasal   Place 1 spray into the  nose 2 (two) times daily.         . budesonide-formoterol (SYMBICORT) 80-4.5 MCG/ACT inhaler   Inhalation   Inhale 2 puffs into the lungs 2 (two) times daily.         . clonazePAM (KLONOPIN) 1 MG tablet   Oral   Take 1 mg by mouth 3 (three) times daily as needed.         . diphenhydrAMINE-zinc acetate (BENADRYL) cream   Topical   Apply topically 3 (three) times daily as needed for itching.   28.3 g   0   . DULoxetine (CYMBALTA) 30 MG capsule   Oral   Take 30 mg by mouth daily.         . famotidine (PEPCID) 20 MG tablet   Oral   Take 20 mg by mouth 2 (two) times daily.         Marland Kitchen gabapentin (NEURONTIN) 600 MG tablet   Oral   Take 600 mg by mouth 2 (two) times daily.         Marland Kitchen levothyroxine (SYNTHROID, LEVOTHROID) 100 MCG tablet   Oral   Take 100 mcg by mouth daily.         . montelukast (SINGULAIR) 10 MG tablet   Oral   Take 10 mg by mouth daily.         Marland Kitchen  Multiple Vitamin (MULTI-VITAMINS) TABS   Oral   Take 1 tablet by mouth daily.         . ondansetron (ZOFRAN) 8 MG tablet   Oral   Take 1 tablet by mouth every 8 (eight) hours as needed. nausea and vometing         . oxyCODONE-acetaminophen (ROXICET) 5-325 MG per tablet   Oral   Take 1 tablet by mouth every 6 (six) hours as needed for severe pain.   12 tablet   0   . predniSONE (STERAPRED UNI-PAK 21 TAB) 10 MG (21) TBPK tablet   Oral   Take 1 tablet (10 mg total) by mouth daily. 20 mg po daily for 2 days; 10 mg po daily for 2 days.   6 tablet   0   . tamsulosin (FLOMAX) 0.4 MG CAPS capsule   Oral   Take 1 capsule by mouth daily.         Marland Kitchen tiotropium (SPIRIVA HANDIHALER) 18 MCG inhalation capsule   Inhalation   Place 1 capsule into inhaler and inhale daily.           Allergies Review of patient's allergies indicates no known allergies.  Family History  Problem Relation Age of Onset  . Hypertension Mother     Social History History  Substance Use Topics  . Smoking  status: Heavy Tobacco Smoker    Types: Cigarettes  . Smokeless tobacco: Not on file  . Alcohol Use: Yes     Comment: 40oz x4 daily    Review of Systems  Constitutional: No fever or chills. No weight changes Eyes:No blurry vision or double vision.  ENT: No sore throat. Cardiovascular: No chest pain. Respiratory: No dyspnea or cough. Gastrointestinal: Negative for abdominal pain, vomiting and diarrhea.  No BRBPR or melena. Genitourinary: Negative for dysuria, urinary retention, bloody urine, or difficulty urinating. Musculoskeletal: Chronic back pain, chronic neuropathic pain, bilateral pedal edema Skin: Negative for rash. Neurological: Negative for headaches, focal weakness or numbness. Psychiatric:No anxiety or depression.   Endocrine:No hot/cold intolerance, changes in energy, or sleep difficulty.  10-point ROS otherwise negative.  ____________________________________________   PHYSICAL EXAM:  VITAL SIGNS: ED Triage Vitals  Enc Vitals Group     BP 01/01/15 1233 131/103 mmHg     Pulse Rate 01/01/15 1233 89     Resp 01/01/15 1233 18     Temp 01/01/15 1233 98.2 F (36.8 C)     Temp Source 01/01/15 1233 Oral     SpO2 01/01/15 1233 100 %     Weight 01/01/15 1233 130 lb (58.968 kg)     Height 01/01/15 1233 5\' 6"  (1.676 m)     Head Cir --      Peak Flow --      Pain Score 01/01/15 1234 9     Pain Loc --      Pain Edu? --      Excl. in [place]? --      Constitutional: Alert and oriented. Well appearing and in no distress. Breathing comfortably while lying supine Eyes: No scleral icterus. No conjunctival pallor. PERRL. EOMI ENT   Head: Normocephalic and atraumatic.   Nose: No congestion/rhinnorhea. No septal hematoma   Mouth/Throat: MMM, no pharyngeal erythema. No peritonsillar mass. No uvula shift.   Neck: No stridor. No SubQ emphysema. No meningismus. No JVD Hematological/Lymphatic/Immunilogical: No cervical lymphadenopathy. Cardiovascular: RRR. Normal and  symmetric distal pulses are present in all extremities. No murmurs, rubs, or gallops. Respiratory: Normal respiratory effort  without tachypnea nor retractions. Breath sounds are clear and equal bilaterally. No wheezes/rales/rhonchi. Gastrointestinal: Soft and nontender. No distention. There is no CVA tenderness.  No rebound, rigidity, or guarding. Genitourinary: deferred Musculoskeletal: Nontender with normal range of motion in all extremities. No joint effusions.  No lower extremity tenderness.  1+ pitting edema bilateral feet and ankles with normal pulses. Neurologic:   Normal speech and language.  CN 2-10 normal. Motor grossly intact. Sensation intact No pronator drift.  Normal gait. No gross focal neurologic deficits are appreciated.  Skin:  Skin is warm, dry and intact. No rash noted.  No petechiae, purpura, or bullae. Psychiatric: Mood and affect are normal. Speech and behavior are normal. Patient exhibits appropriate insight and judgment.  ____________________________________________    LABS (pertinent positives/negatives) (all labs ordered are listed, but only abnormal results are displayed) Labs Reviewed  CBC - Abnormal; Notable for the following:    WBC 3.5 (*)    RBC 3.07 (*)    Hemoglobin 9.9 (*)    HCT 29.0 (*)    All other components within normal limits  BASIC METABOLIC PANEL - Abnormal; Notable for the following:    Sodium 123 (*)    Chloride 89 (*)    Glucose, Bld 113 (*)    Calcium 8.5 (*)    All other components within normal limits  BRAIN NATRIURETIC PEPTIDE   hyponatremia at chronic baseline. ____________________________________________   EKG    ____________________________________________    RADIOLOGY    ____________________________________________   PROCEDURES  ____________________________________________   INITIAL IMPRESSION / ASSESSMENT AND PLAN / ED COURSE  Pertinent labs & imaging results that were available during my care of the  patient were reviewed by me and considered in my medical decision making (see chart for details).  Patient presents with complaints of chronic pain and pedal edema. Edema appears to be related to his neuropathy even though it is new. There is no evidence whatsoever of heart failure or DVT. He is otherwise well-appearing no acute distress vital signs are unremarkable. I admonished him to follow up with his primary care doctor PRESCRIBED him a short course of analgesics. I also encouraged him to get compression stockings for his pedal edema and keep the legs elevated when able.  ____________________________________________   FINAL CLINICAL IMPRESSION(S) / ED DIAGNOSES  Final diagnoses:  Pedal edema  Peripheral neuropathy due to chemotherapy      Carrie Mew, MD 01/01/15 [phone]

## 2015-01-01 NOTE — Discharge Instructions (Signed)
Edema °Edema is an abnormal buildup of fluids in your body tissues. Edema is somewhat dependent on gravity to pull the fluid to the lowest place in your body. That makes the condition more common in the legs and thighs (lower extremities). Painless swelling of the feet and ankles is common and becomes more likely as you get older. It is also common in looser tissues, like around your eyes.  °When the affected area is squeezed, the fluid may move out of that spot and leave a dent for a few moments. This dent is called pitting.  °CAUSES  °There are many possible causes of edema. Eating too much salt and being on your feet or sitting for a long time can cause edema in your legs and ankles. Hot weather may make edema worse. Common medical causes of edema include: °· Heart failure. °· Liver disease. °· Kidney disease. °· Weak blood vessels in your legs. °· Cancer. °· An injury. °· Pregnancy. °· Some medications. °· Obesity.  °SYMPTOMS  °Edema is usually painless. Your skin may look swollen or shiny.  °DIAGNOSIS  °Your health care provider may be able to diagnose edema by asking about your medical history and doing a physical exam. You may need to have tests such as X-rays, an electrocardiogram, or blood tests to check for medical conditions that may cause edema.  °TREATMENT  °Edema treatment depends on the cause. If you have heart, liver, or kidney disease, you need the treatment appropriate for these conditions. General treatment may include: °· Elevation of the affected body part above the level of your heart. °· Compression of the affected body part. Pressure from elastic bandages or support stockings squeezes the tissues and forces fluid back into the blood vessels. This keeps fluid from entering the tissues. °· Restriction of fluid and salt intake. °· Use of a water pill (diuretic). These medications are appropriate only for some types of edema. They pull fluid out of your body and make you urinate more often. This  gets rid of fluid and reduces swelling, but diuretics can have side effects. Only use diuretics as directed by your health care provider. °HOME CARE INSTRUCTIONS  °· Keep the affected body part above the level of your heart when you are lying down.   °· Do not sit still or stand for prolonged periods.   °· Do not put anything directly under your knees when lying down. °· Do not wear constricting clothing or garters on your upper legs.   °· Exercise your legs to work the fluid back into your blood vessels. This may help the swelling go down.   °· Wear elastic bandages or support stockings to reduce ankle swelling as directed by your health care provider.   °· Eat a low-salt diet to reduce fluid if your health care provider recommends it.   °· Only take medicines as directed by your health care provider.  °SEEK MEDICAL CARE IF:  °· Your edema is not responding to treatment. °· You have heart, liver, or kidney disease and notice symptoms of edema. °· You have edema in your legs that does not improve after elevating them.   °· You have sudden and unexplained weight gain. °SEEK IMMEDIATE MEDICAL CARE IF:  °· You develop shortness of breath or chest pain.   °· You cannot breathe when you lie down. °· You develop pain, redness, or warmth in the swollen areas.   °· You have heart, liver, or kidney disease and suddenly get edema. °· You have a fever and your symptoms suddenly get worse. °MAKE SURE YOU:  °·   Understand these instructions.  Will watch your condition.  Will get help right away if you are not doing well or get worse. Document Released: 07/24/2005 Document Revised: 12/08/2013 Document Reviewed: 05/16/2013 [facility] Patient Information 2015 [place], [place]. This information is not intended to replace advice given to you by your health care provider. Make sure you discuss any questions you have with your health care provider.  Neuropathic Pain We often think that pain has a physical cause. If we get rid of  the cause, the pain should go away. Nerves themselves can also cause pain. It is called neuropathic pain, which means nerve abnormality. It may be difficult for the patients who have it and for the treating caregivers. Pain is usually described as acute (short-lived) or chronic (long-lasting). Acute pain is related to the physical sensations caused by an injury. It can last from a few seconds to many weeks, but it usually goes away when normal healing occurs. Chronic pain lasts beyond the typical healing time. With neuropathic pain, the nerve fibers themselves may be damaged or injured. They then send incorrect signals to other pain centers. The pain you feel is real, but the cause is not easy to find.  CAUSES  Chronic pain can result from diseases, such as diabetes and shingles (an infection related to chickenpox), or from trauma, surgery, or amputation. It can also happen without any known injury or disease. The nerves are sending pain messages, even though there is no identifiable cause for such messages.   Other common causes of neuropathy include diabetes, phantom limb pain, or Regional Pain Syndrome (RPS).  As with all forms of chronic back pain, if neuropathy is not correctly treated, there can be a number of associated problems that lead to a downward cycle for the patient. These include depression, sleeplessness, feelings of fear and anxiety, limited social interaction and inability to do normal daily activities or work.  The most dramatic and mysterious example of neuropathic pain is called "phantom limb syndrome." This occurs when an arm or a leg has been removed because of illness or injury. The brain still gets pain messages from the nerves that originally carried impulses from the missing limb. These nerves now seem to misfire and cause troubling pain.  Neuropathic pain often seems to have no cause. It responds poorly to standard pain treatment. Neuropathic pain can occur after:  Shingles  (herpes zoster virus infection).  A lasting burning sensation of the skin, caused usually by injury to a peripheral nerve.  Peripheral neuropathy which is widespread nerve damage, often caused by diabetes or alcoholism.  Phantom limb pain following an amputation.  Facial nerve problems (trigeminal neuralgia).  Multiple sclerosis.  Reflex sympathetic dystrophy.  Pain which comes with cancer and cancer chemotherapy.  Entrapment neuropathy such as when pressure is put on a nerve such as in carpal tunnel syndrome.  Back, leg, and hip problems (sciatica).  Spine or back surgery.  HIV Infection or AIDS where nerves are infected by viruses. Your caregiver can explain items in the above list which may apply to you. SYMPTOMS  Characteristics of neuropathic pain are:  Severe, sharp, electric shock-like, shooting, lightening-like, knife-like.  Pins and needles sensation.  Deep burning, deep cold, or deep ache.  Persistent numbness, tingling, or weakness.  Pain resulting from light touch or other stimulus that would not usually cause pain.  Increased sensitivity to something that would normally cause pain, such as a pinprick. Pain may persist for months or years following the healing  of damaged tissues. When this happens, pain signals no longer sound an alarm about current injuries or injuries about to happen. Instead, the alarm system itself is not working correctly.  Neuropathic pain may get worse instead of better over time. For some people, it can lead to serious disability. It is important to be aware that severe injury in a limb can occur without a proper, protective pain response.Burns, cuts, and other injuries may go unnoticed. Without proper treatment, these injuries can become infected or lead to further disability. Take any injury seriously, and consult your caregiver for treatment. DIAGNOSIS  When you have a pain with no known cause, your caregiver will probably ask some  specific questions:   Do you have any other conditions, such as diabetes, shingles, multiple sclerosis, or HIV infection?  How would you describe your pain? (Neuropathic pain is often described as shooting, stabbing, burning, or searing.)  Is your pain worse at any time of the day? (Neuropathic pain is usually worse at night.)  Does the pain seem to follow a certain physical pathway?  Does the pain come from an area that has missing or injured nerves? (An example would be phantom limb pain.)  Is the pain triggered by minor things such as rubbing against the sheets at night? These questions often help define the type of pain involved. Once your caregiver knows what is happening, treatment can begin. Anticonvulsant, antidepressant drugs, and various pain relievers seem to work in some cases. If another condition, such as diabetes is involved, better management of that disorder may relieve the neuropathic pain.  TREATMENT  Neuropathic pain is frequently long-lasting and tends not to respond to treatment with narcotic type pain medication. It may respond well to other drugs such as antiseizure and antidepressant medications. Usually, neuropathic problems do not completely go away, but partial improvement is often possible with proper treatment. Your caregivers have large numbers of medications available to treat you. Do not be discouraged if you do not get immediate relief. Sometimes different medications or a combination of medications will be tried before you receive the results you are hoping for. See your caregiver if you have pain that seems to be coming from nowhere and does not go away. Help is available.  SEEK IMMEDIATE MEDICAL CARE IF:   There is a sudden change in the quality of your pain, especially if the change is on only one side of the body.  You notice changes of the skin, such as redness, black or purple discoloration, swelling, or an ulcer.  You cannot move the affected  limbs. Document Released: 04/20/2004 Document Revised: 10/16/2011 Document Reviewed: 04/20/2004 [facility] Patient Information 2015 [place], [place]. This information is not intended to replace advice given to you by your health care provider. Make sure you discuss any questions you have with your health care provider.

## 2015-01-01 NOTE — ED Notes (Signed)
Pt c/o BL LE swelling for the past week

## 2015-08-17 ENCOUNTER — Inpatient Hospital Stay
Admission: EM | Admit: 2015-08-17 | Discharge: 2015-08-21 | DRG: 641 | Disposition: A | Discharge status: Home / Self Care | Payer: Medicare Other | Attending: Internal Medicine | Admitting: Internal Medicine

## 2015-08-17 ENCOUNTER — Encounter: Payer: Self-pay | Admitting: *Deleted

## 2015-08-17 DIAGNOSIS — E860 Dehydration: Secondary | ICD-10-CM | POA: Diagnosis present

## 2015-08-17 DIAGNOSIS — Z85828 Personal history of other malignant neoplasm of skin: Secondary | ICD-10-CM

## 2015-08-17 DIAGNOSIS — F1020 Alcohol dependence, uncomplicated: Secondary | ICD-10-CM | POA: Diagnosis present

## 2015-08-17 DIAGNOSIS — F10229 Alcohol dependence with intoxication, unspecified: Secondary | ICD-10-CM | POA: Diagnosis present

## 2015-08-17 DIAGNOSIS — J449 Chronic obstructive pulmonary disease, unspecified: Secondary | ICD-10-CM | POA: Diagnosis present

## 2015-08-17 DIAGNOSIS — Y906 Blood alcohol level of 120-199 mg/100 ml: Secondary | ICD-10-CM | POA: Diagnosis present

## 2015-08-17 DIAGNOSIS — R509 Fever, unspecified: Secondary | ICD-10-CM

## 2015-08-17 DIAGNOSIS — I10 Essential (primary) hypertension: Secondary | ICD-10-CM | POA: Diagnosis present

## 2015-08-17 DIAGNOSIS — F17210 Nicotine dependence, cigarettes, uncomplicated: Secondary | ICD-10-CM | POA: Diagnosis present

## 2015-08-17 DIAGNOSIS — N390 Urinary tract infection, site not specified: Secondary | ICD-10-CM | POA: Diagnosis present

## 2015-08-17 DIAGNOSIS — N400 Enlarged prostate without lower urinary tract symptoms: Secondary | ICD-10-CM | POA: Diagnosis present

## 2015-08-17 DIAGNOSIS — Z9221 Personal history of antineoplastic chemotherapy: Secondary | ICD-10-CM | POA: Diagnosis not present

## 2015-08-17 DIAGNOSIS — R569 Unspecified convulsions: Secondary | ICD-10-CM | POA: Diagnosis present

## 2015-08-17 DIAGNOSIS — E871 Hypo-osmolality and hyponatremia: Principal | ICD-10-CM

## 2015-08-17 DIAGNOSIS — Z9114 Patient's other noncompliance with medication regimen: Secondary | ICD-10-CM

## 2015-08-17 DIAGNOSIS — Z79899 Other long term (current) drug therapy: Secondary | ICD-10-CM

## 2015-08-17 DIAGNOSIS — Z9119 Patient's noncompliance with other medical treatment and regimen: Secondary | ICD-10-CM

## 2015-08-17 DIAGNOSIS — Z85810 Personal history of malignant neoplasm of tongue: Secondary | ICD-10-CM | POA: Diagnosis not present

## 2015-08-17 LAB — COMPREHENSIVE METABOLIC PANEL
ALT: 21 U/L (ref 17–63)
AST: 44 U/L — ABNORMAL HIGH (ref 15–41)
Albumin: 4.1 g/dL (ref 3.5–5.0)
Alkaline Phosphatase: 120 U/L (ref 38–126)
Anion gap: 8 (ref 5–15)
BUN: 7 mg/dL (ref 6–20)
CO2: 26 mmol/L (ref 22–32)
CREATININE: 0.89 mg/dL (ref 0.61–1.24)
Calcium: 8.8 mg/dL — ABNORMAL LOW (ref 8.9–10.3)
Chloride: 78 mmol/L — ABNORMAL LOW (ref 101–111)
GFR calc Af Amer: >60 mL/min (ref >60)
Glucose, Bld: 106 mg/dL — ABNORMAL HIGH (ref 65–99)
Potassium: 4.2 mmol/L (ref 3.5–5.1)
Sodium: 112 mmol/L — CL (ref 135–145)
Total Bilirubin: 0.3 mg/dL (ref 0.3–1.2)
Total Protein: 7.3 g/dL (ref 6.5–8.1)

## 2015-08-17 LAB — CBC
HCT: 36.1 % — ABNORMAL LOW (ref 40.0–52.0)
Hemoglobin: 12.7 g/dL — ABNORMAL LOW (ref 13.0–18.0)
MCH: 29.7 pg (ref 26.0–34.0)
MCHC: 35.1 g/dL (ref 32.0–36.0)
MCV: 84.6 fL (ref 80.0–100.0)
PLATELETS: 256 10*3/uL (ref 150–440)
RBC: 4.27 MIL/uL — ABNORMAL LOW (ref 4.40–5.90)
RDW: 14.7 % — AB (ref 11.5–14.5)
WBC: 6.2 10*3/uL (ref 3.8–10.6)

## 2015-08-17 LAB — ETHANOL: ALCOHOL ETHYL (B): 127 mg/dL — AB (ref <5)

## 2015-08-17 LAB — MAGNESIUM: Magnesium: 1.8 mg/dL (ref 1.7–2.4)

## 2015-08-17 LAB — PHOSPHORUS: PHOSPHORUS: 2.8 mg/dL (ref 2.5–4.6)

## 2015-08-17 MED ORDER — LORAZEPAM 2 MG/ML IJ SOLN
0.0000 mg | Freq: Four times a day (QID) | INTRAMUSCULAR | Status: AC
  Administered 2015-08-18: 1 mg via INTRAVENOUS
  Administered 2015-08-19 (×3): 2 mg via INTRAVENOUS
  Filled 2015-08-17 (×3): qty 1

## 2015-08-17 MED ORDER — LORAZEPAM 1 MG PO TABS
1.0000 mg | ORAL_TABLET | Freq: Four times a day (QID) | ORAL | Status: AC | PRN
  Administered 2015-08-17 – 2015-08-20 (×4): 1 mg via ORAL
  Filled 2015-08-17 (×6): qty 1

## 2015-08-17 MED ORDER — LORAZEPAM 2 MG PO TABS
0.0000 mg | ORAL_TABLET | Freq: Four times a day (QID) | ORAL | Status: DC
Start: 2015-08-17 — End: 2015-08-17

## 2015-08-17 MED ORDER — LORAZEPAM 2 MG/ML IJ SOLN: 0.0000 mg | Freq: Four times a day (QID) | INTRAMUSCULAR | Status: DC

## 2015-08-17 MED ORDER — FAMOTIDINE 20 MG PO TABS
20.0000 mg | ORAL_TABLET | Freq: Two times a day (BID) | ORAL | Status: DC
  Administered 2015-08-17 – 2015-08-21 (×7): 20 mg via ORAL
  Filled 2015-08-17 (×8): qty 1

## 2015-08-17 MED ORDER — THIAMINE HCL 100 MG/ML IJ SOLN: 100.0000 mg | Freq: Every day | INTRAMUSCULAR | Status: DC

## 2015-08-17 MED ORDER — VITAMIN B-1 100 MG PO TABS
100.0000 mg | ORAL_TABLET | Freq: Every day | ORAL | Status: DC
  Administered 2015-08-18 – 2015-08-21 (×4): 100 mg via ORAL
  Filled 2015-08-17 (×4): qty 1

## 2015-08-17 MED ORDER — LORAZEPAM 2 MG/ML IJ SOLN: 0.0000 mg | Freq: Two times a day (BID) | INTRAMUSCULAR | Status: DC

## 2015-08-17 MED ORDER — FOLIC ACID 1 MG PO TABS
1.0000 mg | ORAL_TABLET | Freq: Every day | ORAL | Status: DC
  Administered 2015-08-18 – 2015-08-21 (×4): 1 mg via ORAL
  Filled 2015-08-17 (×4): qty 1

## 2015-08-17 MED ORDER — SODIUM CHLORIDE 0.9 % IJ SOLN
3.0000 mL | Freq: Two times a day (BID) | INTRAMUSCULAR | Status: DC
  Administered 2015-08-17: 3 mL via INTRAVENOUS

## 2015-08-17 MED ORDER — ENOXAPARIN SODIUM 40 MG/0.4ML ~~LOC~~ SOLN
40.0000 mg | SUBCUTANEOUS | Status: DC
  Administered 2015-08-17 – 2015-08-20 (×3): 40 mg via SUBCUTANEOUS
  Filled 2015-08-17 (×3): qty 0.4

## 2015-08-17 MED ORDER — ONDANSETRON HCL 4 MG/2ML IJ SOLN
4.0000 mg | Freq: Four times a day (QID) | INTRAMUSCULAR | Status: DC | PRN
  Administered 2015-08-18: 4 mg via INTRAVENOUS
  Filled 2015-08-17: qty 2

## 2015-08-17 MED ORDER — SODIUM CHLORIDE 0.9 % IV BOLUS (SEPSIS)
2000.0000 mL | Freq: Once | INTRAVENOUS | Status: AC
  Administered 2015-08-17: 2000 mL via INTRAVENOUS

## 2015-08-17 MED ORDER — ONDANSETRON HCL 4 MG PO TABS: 4.0000 mg | ORAL_TABLET | Freq: Four times a day (QID) | ORAL | Status: DC | PRN

## 2015-08-17 MED ORDER — LORAZEPAM 2 MG/ML IJ SOLN
0.0000 mg | Freq: Two times a day (BID) | INTRAMUSCULAR | Status: DC
  Administered 2015-08-21: 2 mg via INTRAVENOUS
  Filled 2015-08-17: qty 1

## 2015-08-17 MED ORDER — LORAZEPAM 2 MG PO TABS
0.0000 mg | ORAL_TABLET | Freq: Two times a day (BID) | ORAL | Status: DC
Start: 2015-08-17 — End: 2015-08-17

## 2015-08-17 MED ORDER — ADULT MULTIVITAMIN W/MINERALS CH
1.0000 | ORAL_TABLET | Freq: Every day | ORAL | Status: DC
  Administered 2015-08-18 – 2015-08-21 (×4): 1 via ORAL
  Filled 2015-08-17 (×4): qty 1

## 2015-08-17 MED ORDER — BUDESONIDE-FORMOTEROL FUMARATE 80-4.5 MCG/ACT IN AERO
2.0000 | INHALATION_SPRAY | Freq: Two times a day (BID) | RESPIRATORY_TRACT | Status: DC
  Administered 2015-08-18 – 2015-08-21 (×6): 2 via RESPIRATORY_TRACT
  Filled 2015-08-17 (×2): qty 6.9

## 2015-08-17 MED ORDER — LORAZEPAM 2 MG/ML IJ SOLN
1.0000 mg | Freq: Four times a day (QID) | INTRAMUSCULAR | Status: AC | PRN
  Filled 2015-08-17: qty 1

## 2015-08-17 MED ORDER — AMLODIPINE BESYLATE 5 MG PO TABS
2.5000 mg | ORAL_TABLET | Freq: Every day | ORAL | Status: DC
  Administered 2015-08-18 – 2015-08-21 (×4): 2.5 mg via ORAL
  Filled 2015-08-17 (×4): qty 1

## 2015-08-17 MED ORDER — SODIUM CHLORIDE 1 G PO TABS
2.0000 g | ORAL_TABLET | Freq: Three times a day (TID) | ORAL | Status: DC
  Administered 2015-08-18 – 2015-08-21 (×11): 2 g via ORAL
  Filled 2015-08-17 (×13): qty 2

## 2015-08-17 MED ORDER — FOLIC ACID 1 MG PO TABS
1.0000 mg | ORAL_TABLET | Freq: Once | ORAL | Status: AC
  Administered 2015-08-17: 1 mg via ORAL
  Filled 2015-08-17: qty 1

## 2015-08-17 MED ORDER — SODIUM CHLORIDE 0.9 % IV SOLN
INTRAVENOUS | Status: AC
  Administered 2015-08-17 – 2015-08-18 (×2): via INTRAVENOUS

## 2015-08-17 MED ORDER — VITAMIN B-1 100 MG PO TABS
100.0000 mg | ORAL_TABLET | Freq: Every day | ORAL | Status: DC
  Administered 2015-08-17: 100 mg via ORAL
  Filled 2015-08-17: qty 1

## 2015-08-17 NOTE — ED Notes (Signed)
Pt ambulatory to [facility] w/ no assist.

## 2015-08-17 NOTE — H&P (Signed)
[facility] [place] NAME: Thomas [name]    MR#:  [id number]  DATE OF BIRTH:  1951/03/03  DATE OF ADMISSION:  08/17/2015  PRIMARY CARE PHYSICIAN: [facility]   REQUESTING/REFERRING PHYSICIAN: Joni Fears, MD  CHIEF COMPLAINT:   Chief Complaint  Patient presents with  . Alcohol Intoxication    HISTORY OF PRESENT ILLNESS:  Thomas [name]  is a 65 y.o. male who presents with weakness. Patient states that he has been increasingly weak over the last several days to a week. He states he's had decreased by mouth intake, and has been drinking a lot of alcohol. He states that this is happening before in the past, and his sodium has been low. It seems his baseline sodium runs low, based on chart review, in the mid 120s. In the ED today it is 112. He last had an admission in May 2016, when his sodium was at that time down to 112 and 113 as well. He was given IV saline for hydration and salt tabs, and his sodium came back up to his baseline at that time. He does state that when he hasn't had something to drink for "a little while" he has had seizures in the past. Hospitalists were called for admission for hyponatremia.  PAST MEDICAL HISTORY:   Past Medical History  Diagnosis Date  . Hypertension   . 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 Enlarged prostate   . Tongue cancer ([place])   . Renal disorder   . Lung infection     no lung cancer  . Radiation     to neck  . Status post chemotherapy   . Skin cancer     PAST SURGICAL HISTORY:   Past Surgical History  Procedure Laterality Date  . Cancerous lymph nodes removed from neck    . Knee surgery Right   . Skin biopsy      SOCIAL HISTORY:   Social History  Substance Use Topics  . Smoking status: Heavy Tobacco Smoker    Types: Cigarettes  . Smokeless tobacco: Not on file  . Alcohol Use: Yes     Comment: 40oz x4 daily    FAMILY HISTORY:   Family History  Problem Relation Age  of Onset  . Hypertension Mother     DRUG ALLERGIES:  No Known Allergies  MEDICATIONS AT HOME:   Prior to Admission medications   Medication Sig Start Date End Date Taking? Authorizing Provider  amLODipine (NORVASC) 2.5 MG tablet Take 2.5 mg by mouth daily.   Yes Historical Provider, MD  budesonide-formoterol (SYMBICORT) 80-4.5 MCG/ACT inhaler Inhale 2 puffs into the lungs 2 (two) times daily. 09/27/11  Yes Historical Provider, MD  famotidine (PEPCID) 20 MG tablet Take 20 mg by mouth 2 (two) times daily.   Yes Historical Provider, MD  loratadine (CLARITIN) 10 MG tablet Take 10 mg by mouth daily as needed for allergies.   Yes Historical Provider, MD  QUEtiapine (SEROQUEL) 50 MG tablet Take 50-100 mg by mouth at bedtime as needed (for sleep).   Yes Historical Provider, MD    REVIEW OF SYSTEMS:  Review of Systems  Constitutional: Positive for malaise/fatigue. Negative for fever, chills and weight loss.  HENT: Negative for ear pain, hearing loss and tinnitus.   Eyes: Negative for blurred vision, double vision, pain and redness.  Respiratory: Negative for cough, hemoptysis and shortness of breath.   Cardiovascular: Negative for chest pain, palpitations, orthopnea and leg  swelling.  Gastrointestinal: Negative for nausea, vomiting, abdominal pain, diarrhea and constipation.  Genitourinary: Negative for dysuria, frequency and hematuria.  Musculoskeletal: Negative for back pain, joint pain and neck pain.  Skin:       No acne, rash, or lesions  Neurological: Positive for weakness. Negative for dizziness, tremors and focal weakness.  Endo/Heme/Allergies: Negative for polydipsia. Does not bruise/bleed easily.  Psychiatric/Behavioral: Negative for depression. The patient is not nervous/anxious and does not have insomnia.      VITAL SIGNS:   Filed Vitals:   08/17/15 1854  BP: 182/88  Pulse: 80  Temp: 97.7 F (36.5 C)  TempSrc: Oral  Resp: 18  Height: 5\' 7"  (1.702 m)  Weight: 58.968 kg  (130 lb)  SpO2: 99%   Wt Readings from Last 3 Encounters:  08/17/15 58.968 kg (130 lb)  01/01/15 58.968 kg (130 lb)  12/12/14 55.566 kg (122 lb 8 oz)    PHYSICAL EXAMINATION:  Physical Exam  Vitals reviewed. Constitutional: He is oriented to person, place, and time. He appears well-developed and well-nourished. No distress.  HENT:  Head: Normocephalic and atraumatic.  Mouth/Throat: Oropharynx is clear and moist.  Eyes: Conjunctivae and EOM are normal. Pupils are equal, round, and reactive to light. No scleral icterus.  Neck: Normal range of motion. Neck supple. No JVD present. No thyromegaly present.  Cardiovascular: Normal rate, regular rhythm and intact distal pulses.  Exam reveals no gallop and no friction rub.   Murmur (2/6 soft systolic murmur) heard. Respiratory: Effort normal and breath sounds normal. No respiratory distress. He has no wheezes. He has no rales.  GI: Soft. Bowel sounds are normal. He exhibits no distension. There is no tenderness.  Musculoskeletal: Normal range of motion. He exhibits no edema.  No arthritis, no gout  Lymphadenopathy:    He has no cervical adenopathy.  Neurological: He is alert and oriented to person, place, and time. No cranial nerve deficit.  No dysarthria, no aphasia, no focal weakness  Skin: Skin is warm and dry. No rash noted. No erythema.  Psychiatric: He has a normal mood and affect. His behavior is normal. Thought content normal.    LABORATORY PANEL:   CBC  Recent Labs Lab 08/17/15 1858  WBC 6.2  HGB 12.7*  HCT 36.1*  PLT 256   ------------------------------------------------------------------------------------------------------------------  Chemistries   Recent Labs Lab 08/17/15 1858  NA 112*  K 4.2  CL 78*  CO2 26  GLUCOSE 106*  BUN 7  CREATININE 0.89  CALCIUM 8.8*  AST 44*  ALT 21  ALKPHOS 120  BILITOT 0.3    ------------------------------------------------------------------------------------------------------------------  Cardiac Enzymes No results for input(s): TROPONINI in the last 168 hours. ------------------------------------------------------------------------------------------------------------------  RADIOLOGY:  No results found.  EKG:   Orders placed or performed during the hospital encounter of 12/11/14  . EKG (if new onset seizures)  . EKG (if new onset seizures)  . EKG 12-Lead  . EKG 12-Lead  . EKG 12-Lead  . EKG 12-Lead  . EKG    IMPRESSION AND PLAN:  Principal Problem:   Hyponatremia - we'll place the patient on normal saline for hydration, as well as adding salt tabs to improve sodium. We'll check his sodium every 4 to every 6 hours for now. Might consider nephrology consult if his sodium does not correct to baseline relatively soon. Active Problems:   Dehydration - hydration with IV fluids.   Alcohol abuse - CIWA protocol   Seizure [facility]) - patient states that this happens under what sounds like  alcohol withdrawal circumstances, he will be on seizure precautions while here   Essential hypertension - continue home meds   COPD (chronic obstructive pulmonary disease) ([place]) - continue home inhaler  All the records are reviewed and case discussed with ED provider. Management plans discussed with the patient and/or family.  DVT PROPHYLAXIS: SubQ lovenox  GI PROPHYLAXIS: None  ADMISSION STATUS: Inpatient  CODE STATUS: Full Code Status History    Date Active Date Inactive Code Status Order ID Comments User Context   12/12/2014  1:19 AM 12/18/2014  4:59 PM Full Code [id number]  Lytle Butte, MD ED      TOTAL TIME TAKING CARE OF THIS PATIENT: 45 minutes.    Francesa Eugenio [place] 08/17/2015, 9:29 PM  [facility]  Office  [phone]  CC: Primary care physician; [place]

## 2015-08-17 NOTE — ED Notes (Signed)
Pt alert, denies pain, SI/HI.  Pt intoxicated.

## 2015-08-17 NOTE — ED Notes (Signed)
Pt comes into the ED via EMS from [facility] requesting admission for ETOH detox.Marland Kitchen

## 2015-08-17 NOTE — ED Provider Notes (Signed)
[facility] Emergency Department Provider Note  ____________________________________________  Time seen: 7:15 PM  I have reviewed the triage vital signs and the nursing notes.   HISTORY  Chief Complaint Alcohol Intoxication    HPI Thomas [name] is a 65 y.o. male brought to the ED due to intoxication and generalized weakness. He reports a history of hyponatremia. He also states that when he avoids drinking he gets shaky and has a history of seizures as well. Denies any falls or focal weakness. No seizure today. States that he does not eat, and has not eaten much for the past 2 weeks because he spends all day drinking.Denies SI or HI or hallucinations.   Past Medical History  Diagnosis Date  . Hypertension   . 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 Enlarged prostate   . Tongue cancer ([place])   . Renal disorder   . Lung infection     no lung cancer  . Radiation     to neck  . Status post chemotherapy   . Skin cancer      Patient Active Problem List   Diagnosis Date Noted  . Seizure ([place]) 12/12/2014  . Alcohol abuse 12/12/2014  . Hyponatremia 12/12/2014  . Essential hypertension 12/12/2014  . COPD (chronic obstructive pulmonary disease) ([place]) 12/12/2014     Past Surgical History  Procedure Laterality Date  . Cancerous lymph nodes removed from neck    . Knee surgery Right   . Skin biopsy       Current Outpatient Rx  Name  Route  Sig  Dispense  Refill  . amoxicillin-clavulanate (AUGMENTIN) 875-125 MG per tablet   Oral   Take 1 tablet by mouth 2 (two) times daily.   7 tablet   0   . EXPIRED: azelastine (ASTELIN) 0.1 % nasal spray   Nasal   Place 1 spray into the nose 2 (two) times daily.         . budesonide-formoterol (SYMBICORT) 80-4.5 MCG/ACT inhaler   Inhalation   Inhale 2 puffs into the lungs 2 (two) times daily.         . clonazePAM (KLONOPIN) 1 MG tablet   Oral   Take 1 mg by mouth 3 (three) times daily as needed.          . diphenhydrAMINE-zinc acetate (BENADRYL) cream   Topical   Apply topically 3 (three) times daily as needed for itching.   28.3 g   0   . DULoxetine (CYMBALTA) 30 MG capsule   Oral   Take 30 mg by mouth daily.         . famotidine (PEPCID) 20 MG tablet   Oral   Take 20 mg by mouth 2 (two) times daily.         Marland Kitchen EXPIRED: gabapentin (NEURONTIN) 600 MG tablet   Oral   Take 600 mg by mouth 2 (two) times daily.         Marland Kitchen EXPIRED: levothyroxine (SYNTHROID, LEVOTHROID) 100 MCG tablet   Oral   Take 100 mcg by mouth daily.         Marland Kitchen EXPIRED: montelukast (SINGULAIR) 10 MG tablet   Oral   Take 10 mg by mouth daily.         Marland Kitchen oxyCODONE-acetaminophen (ROXICET) 5-325 MG per tablet   Oral   Take 1 tablet by mouth every 6 (six) hours as needed for severe pain.   12 tablet   0   . predniSONE (STERAPRED  UNI-PAK 21 TAB) 10 MG (21) TBPK tablet   Oral   Take 1 tablet (10 mg total) by mouth daily. 20 mg po daily for 2 days; 10 mg po daily for 2 days.   6 tablet   0   . tiotropium (SPIRIVA HANDIHALER) 18 MCG inhalation capsule   Inhalation   Place 1 capsule into inhaler and inhale daily.            Allergies Review of patient's allergies indicates no known allergies.   Family History  Problem Relation Age of Onset  . Hypertension Mother     Social History Social History  Substance Use Topics  . Smoking status: Heavy Tobacco Smoker    Types: Cigarettes  . Smokeless tobacco: None  . Alcohol Use: Yes     Comment: 40oz x4 daily    Review of Systems  Constitutional:   No fever or chills. No weight changes Eyes:   No blurry vision or double vision.  ENT:   No sore throat. Cardiovascular:   No chest pain. Respiratory:   No dyspnea or cough. Gastrointestinal:   Negative for abdominal pain, vomiting and diarrhea.  No BRBPR or melena. Genitourinary:   Negative for dysuria, urinary retention, bloody urine, or difficulty urinating. Musculoskeletal:   Negative for  back pain. No joint swelling or pain. Skin:   Negative for rash. Neurological:   Negative for headaches, focal weakness or numbness. Psychiatric:  No anxiety or depression.  Chronic alcohol abuse Endocrine:  No hot/cold intolerance, changes in energy, or sleep difficulty.  10-point ROS otherwise negative.  ____________________________________________   PHYSICAL EXAM:  VITAL SIGNS: ED Triage Vitals  Enc Vitals Group     BP 08/17/15 1854 182/88 mmHg     Pulse Rate 08/17/15 1854 80     Resp 08/17/15 1854 18     Temp 08/17/15 1854 97.7 F (36.5 C)     Temp Source 08/17/15 1854 Oral     SpO2 08/17/15 1854 99 %     Weight 08/17/15 1854 130 lb (58.968 kg)     Height 08/17/15 1854 5\' 7"  (1.702 m)     Head Cir --      Peak Flow --      Pain Score 08/17/15 1854 7     Pain Loc --      Pain Edu? --      Excl. in [place]? --     Vital signs reviewed, nursing assessments reviewed.   Constitutional:   Alert and oriented. Well appearing and in no distress. Eyes:   No scleral icterus. No conjunctival pallor. PERRL. EOMI ENT   Head:   Normocephalic and atraumatic.   Nose:   No congestion/rhinnorhea. No septal hematoma   Mouth/Throat:   MMM, no pharyngeal erythema. No peritonsillar mass. No uvula shift.   Neck:   No stridor. No SubQ emphysema. No meningismus. Hematological/Lymphatic/Immunilogical:   No cervical lymphadenopathy. Cardiovascular:   RRR. Normal and symmetric distal pulses are present in all extremities. No murmurs, rubs, or gallops. Respiratory:   Normal respiratory effort without tachypnea nor retractions. Slight expiratory wheezing, normal expiratory phase Gastrointestinal:   Soft and nontender. No distention. There is no CVA tenderness.  No rebound, rigidity, or guarding. Genitourinary:   deferred Musculoskeletal:   Nontender with normal range of motion in all extremities. No joint effusions.  No lower extremity tenderness.  No edema. Neurologic:   Slurred  speech CN 2-10 normal. Motor grossly intact. No pronator drift.  Normal gait. No  gross focal neurologic deficits are appreciated.  Skin:    Skin is warm, dry and intact. No rash noted.  No petechiae, purpura, or bullae. Psychiatric:   Mood and affect are normal. Speech and behavior are normal. Patient exhibits appropriate insight and judgment.  ____________________________________________    LABS (pertinent positives/negatives) (all labs ordered are listed, but only abnormal results are displayed) Labs Reviewed  COMPREHENSIVE METABOLIC PANEL - Abnormal; Notable for the following:    Sodium 112 (*)    Chloride 78 (*)    Glucose, Bld 106 (*)    Calcium 8.8 (*)    AST 44 (*)    All other components within normal limits  ETHANOL - Abnormal; Notable for the following:    Alcohol, Ethyl (B) 127 (*)    All other components within normal limits  CBC - Abnormal; Notable for the following:    RBC 4.27 (*)    Hemoglobin 12.7 (*)    HCT 36.1 (*)    RDW 14.7 (*)    All other components within normal limits   ____________________________________________   EKG    ____________________________________________    RADIOLOGY    ____________________________________________   PROCEDURES CRITICAL CARE Performed by: Joni Fears, Jeannie Mallinger   Total critical care time: 35 minutes  Critical care time was exclusive of separately billable procedures and treating other patients.  Critical care was necessary to treat or prevent imminent or life-threatening deterioration.  Critical care was time spent personally by me on the following activities: development of treatment plan with patient and/or surrogate as well as nursing, discussions with consultants, evaluation of patient's response to treatment, examination of patient, obtaining history from patient or surrogate, ordering and performing treatments and interventions, ordering and review of laboratory studies, ordering and review of  radiographic studies, pulse oximetry and re-evaluation of patient's condition.   ____________________________________________   INITIAL IMPRESSION / ASSESSMENT AND PLAN / ED COURSE  Pertinent labs & imaging results that were available during my care of the patient were reviewed by me and considered in my medical decision making (see chart for details).  Patient presents with generalized weakness with a history of alcohol abuse and hyponatremia. We'll check labs and monitor to sobriety.    ----------------------------------------- 8:43 PM on 08/17/2015 ----------------------------------------- Labs reveal severe hyponatremia with a sodium of 112. He does appear to be symptomatic with this generalized weakness. We'll plan to begin treatment with 2 L normal saline. He also has chronic alcohol abuse and dependence, so we'll start on a CIWA protocol to monitor for withdrawal as well we treat the hyponatremia in hospital. Case discussed with the hospitalist for further management.      ____________________________________________   FINAL CLINICAL IMPRESSION(S) / ED DIAGNOSES  Final diagnoses:  Hyponatremia  Alcohol dependence with intoxication with complication [facility])      Carrie Mew, MD 08/17/15 2047

## 2015-08-17 NOTE — ED Notes (Addendum)
Pt states he has been drinking beer and not eating for about 2 weeks, pt intoxicated upon assessment, states hx of nyponatremia, pt states he drank today but "I donnt know how much"

## 2015-08-17 NOTE — ED Notes (Signed)
Pt ambulatory to BR w/ no assist.  Pt requests room.

## 2015-08-18 LAB — BASIC METABOLIC PANEL
ANION GAP: 4 — AB (ref 5–15)
BUN: 7 mg/dL (ref 6–20)
CALCIUM: 7.9 mg/dL — AB (ref 8.9–10.3)
CO2: 24 mmol/L (ref 22–32)
Chloride: 91 mmol/L — ABNORMAL LOW (ref 101–111)
Creatinine, Ser: 1.06 mg/dL (ref 0.61–1.24)
GFR calc non Af Amer: >60 mL/min (ref >60)
Glucose, Bld: 120 mg/dL — ABNORMAL HIGH (ref 65–99)
POTASSIUM: 4.1 mmol/L (ref 3.5–5.1)
Sodium: 119 mmol/L — CL (ref 135–145)

## 2015-08-18 LAB — CBC
HEMATOCRIT: 31.8 % — AB (ref 40.0–52.0)
HEMOGLOBIN: 11.1 g/dL — AB (ref 13.0–18.0)
MCH: 29.2 pg (ref 26.0–34.0)
MCHC: 34.9 g/dL (ref 32.0–36.0)
MCV: 83.8 fL (ref 80.0–100.0)
Platelets: 202 10*3/uL (ref 150–440)
RBC: 3.8 MIL/uL — AB (ref 4.40–5.90)
RDW: 14.7 % — ABNORMAL HIGH (ref 11.5–14.5)
WBC: 5 10*3/uL (ref 3.8–10.6)

## 2015-08-18 LAB — SODIUM: SODIUM: 118 mmol/L — AB (ref 135–145)

## 2015-08-18 MED ORDER — SODIUM CHLORIDE 0.9 % IV SOLN
INTRAVENOUS | Status: AC
  Administered 2015-08-18: 17:00:00 via INTRAVENOUS

## 2015-08-18 NOTE — Progress Notes (Signed)
Call received from lab regarding pt's critical sodium level of -119. Dr.Cortney Beissel informed of the sodium level of 119. No new orders received.

## 2015-08-18 NOTE — Progress Notes (Signed)
[facility] [place] NAME: Shiven Stater    MR#:  [id number]  DATE OF BIRTH:  1950-11-19  SUBJECTIVE:  Pt very poor hisotrian. Cont to drink alcohol. Non compliant with his meds  REVIEW OF SYSTEMS:   Review of Systems  Constitutional: Negative for fever, chills and weight loss.  HENT: Negative for ear discharge, ear pain and nosebleeds.   Eyes: Negative for blurred vision, pain and discharge.  Respiratory: Negative for sputum production, shortness of breath, wheezing and stridor.   Cardiovascular: Negative for chest pain, palpitations, orthopnea and PND.  Gastrointestinal: Negative for nausea, vomiting, abdominal pain and diarrhea.  Genitourinary: Negative for urgency and frequency.  Musculoskeletal: Negative for back pain and joint pain.  Neurological: Positive for weakness. Negative for sensory change, speech change and focal weakness.  Psychiatric/Behavioral: Negative for depression and hallucinations. The patient is nervous/anxious.   All other systems reviewed and are negative.  Tolerating Diet:yes Tolerating PT: pending  DRUG ALLERGIES:  No Known Allergies  VITALS:  Blood pressure 158/69, pulse 79, temperature 99 F (37.2 C), temperature source Oral, resp. rate 18, height 5\' 7"  (1.702 m), weight 61.735 kg (136 lb 1.6 oz), SpO2 100 %.  PHYSICAL EXAMINATION:   Physical Exam  GENERAL:  65 y.o.-year-old patient lying in the bed with no acute distress. dishevled EYES: Pupils equal, round, reactive to light and accommodation. No scleral icterus. Extraocular muscles intact.  HEENT: Head atraumatic, normocephalic. Oropharynx and nasopharynx clear.  NECK:  Supple, no jugular venous distention. No thyroid enlargement, no tenderness.  LUNGS: Normal breath sounds bilaterally, no wheezing, rales, rhonchi. No use of accessory muscles of respiration.  CARDIOVASCULAR: S1, S2 normal. No murmurs, rubs, or gallops.  ABDOMEN: Soft,  nontender, nondistended. Bowel sounds present. No organomegaly or mass.  EXTREMITIES: No cyanosis, clubbing or edema b/l.    NEUROLOGIC: Cranial nerves II through XII are intact. No focal Motor or sensory deficits b/l.   PSYCHIATRIC: The patient is alert and oriented x 3.  SKIN: No obvious rash, lesion, or ulcer.   LABORATORY PANEL:  CBC  Recent Labs Lab 08/18/15 0751  WBC 5.0  HGB 11.1*  HCT 31.8*  PLT 202    Chemistries   Recent Labs Lab 08/17/15 1858  08/18/15 0751  NA 112*  < > 119*  K 4.2  --  4.1  CL 78*  --  91*  CO2 26  --  24  GLUCOSE 106*  --  120*  BUN 7  --  7  CREATININE 0.89  --  1.06  CALCIUM 8.8*  --  7.9*  MG 1.8  --   --   AST 44*  --   --   ALT 21  --   --   ALKPHOS 120  --   --   BILITOT 0.3  --   --   < > = values in this interval not displayed.  ASSESSMENT AND PLAN:   1.Hyponatremia - we'll place the patient on normal saline for hydration, as well as adding salt tabs to improve sodium. We'll check his sodium every 4 to every 6 hours for now. Might consider nephrology consult if his sodium does not correct to baseline relatively soon. -fluid restriciton -ppt non compliant and drinks lot of alcohol  2. Seizure [facility]) - patient states that this happens under what sounds like alcohol withdrawal circumstances, he will be on seizure precautions while here  3. Essential hypertension - continue home meds  4.COPD (chronic obstructive pulmonary disease) ([place]) - continue home inhaler  5. PT to see pt  6.h/o throat cancer. Pt was treated in the past at [facility]  Case discussed with Care Management/Social Worker. Management plans discussed with the patient, family and they are in agreement.  CODE STATUS: FULL  DVT Prophylaxis: lovenox  TOTAL TIME TAKING CARE OF THIS PATIENT: 30 minutes.  >50% time spent on counselling and coordination of care  POSSIBLE D/C IN 1-2 DAYS, DEPENDING ON CLINICAL CONDITION.  Note: This dictation was prepared with  Dragon dictation along with smaller phrase technology. Any transcriptional errors that result from this process are unintentional.  Olis Viverette M.D on 08/18/2015 at 4:47 PM  Between 7am to 6pm - Pager - [phone]  After 6pm go to [email or web address] - password [email or web address] [facility]  [facility]  Office  [phone]  CC: Primary care physician; [place]

## 2015-08-18 NOTE — Care Management (Signed)
Attempted to assess patient for discharge planning. He was sleeping and I did not try to wake him. RNCM will follow. List of home health care agencies left in his room with my contact information.

## 2015-08-18 NOTE — Progress Notes (Signed)
Initial Nutrition Assessment   INTERVENTION:   Meals and Snacks: Cater to patient preferences  Medical Food Supplement Therapy: will send [employer] Instant Breakfast BID for added nutrition   NUTRITION DIAGNOSIS:   Inadequate oral intake related to social / environmental circumstances as evidenced by per patient/family report.  GOAL:   Patient will meet greater than or equal to 90% of their needs  MONITOR:    (Energy Intake, Electrolyte and Renal Profile, Anthropometrics, Digestive System)  REASON FOR ASSESSMENT:   Malnutrition Screening Tool    ASSESSMENT:   Pt admitted with hyponatremia secondary to EtOH use; pt currently on CIWA. Pt sleeping on visit, unable to arouse. RD notes ativan given this am.   Past Medical History  Diagnosis Date  . Hypertension   . 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 Enlarged prostate   . Tongue cancer ([place])   . Renal disorder   . Lung infection     no lung cancer  . Radiation     to neck  . Status post chemotherapy   . Skin cancer      Diet Order:  Diet regular Room service appropriate?: Yes; Fluid consistency:: Thin    Current Nutrition: Recorded po intake 65% of breakfast this am.   Food/Nutrition-Related History: Per MST decreased appetite PTA. Per MD note pt reports poor po intake but was drinking EtOH PTA.   Scheduled Medications:  . amLODipine  2.5 mg Oral Daily  . budesonide-formoterol  2 puff Inhalation BID  . enoxaparin (LOVENOX) injection  40 mg Subcutaneous Q24H  . famotidine  20 mg Oral BID  . folic acid  1 mg Oral Daily  . LORazepam  0-4 mg Intravenous Q6H   Followed by  . [START ON 08/19/2015] LORazepam  0-4 mg Intravenous Q12H  . multivitamin with minerals  1 tablet Oral Daily  . sodium chloride  3 mL Intravenous Q12H  . sodium chloride  2 g Oral TID WC  . thiamine  100 mg Oral Daily   Or  . thiamine  100 mg Intravenous Daily    Electrolyte/Renal Profile and Glucose Profile:   Recent Labs Lab  08/17/15 1858 08/18/15 0154 08/18/15 0751  NA 112* 118* 119*  K 4.2  --  4.1  CL 78*  --  91*  CO2 26  --  24  BUN 7  --  7  CREATININE 0.89  --  1.06  CALCIUM 8.8*  --  7.9*  MG 1.8  --   --   PHOS 2.8  --   --   GLUCOSE 106*  --  120*   Protein Profile:   Recent Labs Lab 08/17/15 1858  ALBUMIN 4.1    Gastrointestinal Profile: Last BM:  08/18/2015   Nutrition-Focused Physical Exam Findings:  Unable to complete Nutrition-Focused physical exam at this time.    Weight Change: Per [facility] encounters pt with weight gain in the past 6 months   Height:   Ht Readings from Last 1 Encounters:  08/17/15 5\' 7"  (1.702 m)    Weight:   Wt Readings from Last 1 Encounters:  08/17/15 136 lb 1.6 oz (61.735 kg)    Wt Readings from Last 10 Encounters:  08/17/15 136 lb 1.6 oz (61.735 kg)  01/01/15 130 lb (58.968 kg)  12/12/14 122 lb 8 oz (55.566 kg)  06/02/14 121 lb 7 oz (55.084 kg)     BMI:  Body mass index is 21.31 kg/(m^2).  Estimated Nutritional Needs:   Kcal:  BEE: 1365kcals, TEE: (IF 1.0-1.2)(AF 1.3) 1775-2130kcals  Protein:  50-62g protein (0.8-1.0g/kg)  Fluid:  1543-1870mL of fluid (25-14mL/kg)  EDUCATION NEEDS:   No education needs identified at this time   [place], RD, LDN Pager [phone] Weekend/On-Call Pager [phone]

## 2015-08-19 LAB — BASIC METABOLIC PANEL
Anion gap: 3 — ABNORMAL LOW (ref 5–15)
BUN: 8 mg/dL (ref 6–20)
CO2: 26 mmol/L (ref 22–32)
CREATININE: 1.04 mg/dL (ref 0.61–1.24)
Calcium: 7.9 mg/dL — ABNORMAL LOW (ref 8.9–10.3)
Chloride: 93 mmol/L — ABNORMAL LOW (ref 101–111)
GFR calc Af Amer: >60 mL/min (ref >60)
GLUCOSE: 102 mg/dL — AB (ref 65–99)
Potassium: 3.5 mmol/L (ref 3.5–5.1)
SODIUM: 122 mmol/L — AB (ref 135–145)

## 2015-08-19 MED ORDER — SODIUM CHLORIDE 0.9 % IV SOLN
INTRAVENOUS | Status: DC
  Administered 2015-08-19 – 2015-08-21 (×3): via INTRAVENOUS

## 2015-08-19 NOTE — Progress Notes (Signed)
Patient calm and cooperative. Patient up to BR with one assist. CIWA controlled with PRN medication. Patient resting between care. Patient appetite increasing. IV fluids infusing. Cardiac Monitoring intact.

## 2015-08-19 NOTE — Evaluation (Signed)
Physical Therapy Evaluation Patient Details Name: Thomas [name] MRN: [id number] DOB: [birth year] Today's Date: 08/19/2015   History of Present Illness  Pt admitted for hyponatremia. Pt with complaints of weakness x few days. Pt with history of alcohol intoxication, HTN, and cancer of the nasal cavities and tongue. Pt very active/independent prior to admission  Clinical Impression  Pt is a 65 year old male who was admitted for hyponatremia. Pt demonstrates all bed mobility/transfers/ambulation at baseline level. Pt with vulgar language during ambulating in hallway. Pt very upset that he is in hospital and is ready for discharge. Pt does not require any further PT needs at this time. Pt will be dc in house and does not require follow up. RN aware. Will dc current orders.     Follow Up Recommendations No PT follow up    Equipment Recommendations  None recommended by PT    Recommendations for Other Services       Precautions / Restrictions Precautions Precautions: None Restrictions Weight Bearing Restrictions: No      Mobility  Bed Mobility Overal bed mobility: Independent                Transfers Overall transfer level: Independent Equipment used: None             General transfer comment: impulsive in nature, however no LOB noted.  Ambulation/Gait Ambulation/Gait assistance: Min guard Ambulation Distance (Feet): 190 Feet Assistive device: None Gait Pattern/deviations: Step-through pattern     General Gait Details: Pt ambulated holding IV pole with short step length and reciprocal gait pattern. Pt hard to redirect during ambulation and with vulgar language out in the hallway. No LOB noted.  Stairs            Wheelchair Mobility    Modified Rankin (Stroke Patients Only)       Balance Overall balance assessment: Independent                                           Pertinent Vitals/Pain Pain Assessment: No/denies pain     Home Living Family/patient expects to be discharged to:: Private residence Living Arrangements:  (reports he lives with his daughter and grandson) Available Help at Discharge: Family Type of Home: House Home Access: Level entry     Home Layout: One level Home Equipment: None      Prior Function Level of Independence: Independent         Comments: primarily household ambulation performed     Hand Dominance        Extremity/Trunk Assessment   Upper Extremity Assessment: Overall WFL for tasks assessed           Lower Extremity Assessment: Overall WFL for tasks assessed         Communication   Communication: No difficulties  Cognition Arousal/Alertness: Awake/alert Behavior During Therapy: Agitated Overall Cognitive Status: Within Functional Limits for tasks assessed                      General Comments      Exercises Other Exercises Other Exercises: Pt ambulated to bathroom and needs cues for safety during transfer and holds onto rail for balance. No LOB noted and pt independent with hygiene       Assessment/Plan    PT Assessment Patent does not need any further PT services  PT Diagnosis  PT Problem List    PT Treatment Interventions     PT Goals (Current goals can be found in the Care Plan section) Acute Rehab PT Goals Patient Stated Goal: to go home PT Goal Formulation: All assessment and education complete, DC therapy Time For Goal Achievement: August 24, 2015 Potential to Achieve Goals: Good    Frequency     Barriers to discharge        Co-evaluation               End of Session Equipment Utilized During Treatment: Gait belt Activity Tolerance: Patient tolerated treatment well Patient left: in bed;with bed alarm set Nurse Communication: Mobility status         Time: [id number] PT Time Calculation (min) (ACUTE ONLY): 19 min   Charges:   PT Evaluation $PT Eval Low Complexity: 1 Procedure PT  Treatments $Therapeutic Activity: 8-22 mins   PT G Codes:        Mahrosh Donnell Aug 24, 2015, 11:57 AM  Greggory Stallion, PT, DPT [phone]

## 2015-08-19 NOTE — Progress Notes (Signed)
Patient's up and ambulating in hallway with PT.  

## 2015-08-19 NOTE — Progress Notes (Signed)
[place] at [place] NAME: Thomas [name]    MR#:  [id number]  DATE OF BIRTH:  Dec 24, 1950  SUBJECTIVE:  Pt very poor hisotrian.looks better and pt feels better  REVIEW OF SYSTEMS:   Review of Systems  Constitutional: Negative for fever, chills and weight loss.  HENT: Negative for ear discharge, ear pain and nosebleeds.   Eyes: Negative for blurred vision, pain and discharge.  Respiratory: Negative for sputum production, shortness of breath, wheezing and stridor.   Cardiovascular: Negative for chest pain, palpitations, orthopnea and PND.  Gastrointestinal: Negative for nausea, vomiting, abdominal pain and diarrhea.  Genitourinary: Negative for urgency and frequency.  Musculoskeletal: Negative for back pain and joint pain.  Neurological: Positive for weakness. Negative for sensory change, speech change and focal weakness.  Psychiatric/Behavioral: Negative for depression and hallucinations. The patient is nervous/anxious.   All other systems reviewed and are negative.  Tolerating Diet:yes Tolerating PT: pending  DRUG ALLERGIES:  No Known Allergies  VITALS:  Blood pressure 157/72, pulse 71, temperature 98.3 F (36.8 C), temperature source Oral, resp. rate 19, height 5\' 7"  (1.702 m), weight 61.735 kg (136 lb 1.6 oz), SpO2 99 %.  PHYSICAL EXAMINATION:   Physical Exam  GENERAL:  65 y.o.-year-old patient lying in the bed with no acute distress. dishevled EYES: Pupils equal, round, reactive to light and accommodation. No scleral icterus. Extraocular muscles intact.  HEENT: Head atraumatic, normocephalic. Oropharynx and nasopharynx clear.  NECK:  Supple, no jugular venous distention. No thyroid enlargement, no tenderness.  LUNGS: Normal breath sounds bilaterally, no wheezing, rales, rhonchi. No use of accessory muscles of respiration.  CARDIOVASCULAR: S1, S2 normal. No murmurs, rubs, or gallops.  ABDOMEN: Soft, nontender,  nondistended.Bowel sounds present. No organomegaly or mass.  EXTREMITIES: No cyanosis, clubbing or edema b/l.    NEUROLOGIC: Cranial nerves II through XII are intact. No focal Motor or sensory deficits b/l.   PSYCHIATRIC: patient is alert and oriented x 3.  SKIN: No obvious rash, lesion, or ulcer.   LABORATORY PANEL:  CBC  Recent Labs Lab 08/18/15 0751  WBC 5.0  HGB 11.1*  HCT 31.8*  PLT 202    Chemistries   Recent Labs Lab 08/17/15 1858  08/19/15 0533  NA 112*  < > 122*  K 4.2  < > 3.5  CL 78*  < > 93*  CO2 26  < > 26  GLUCOSE 106*  < > 102*  BUN 7  < > 8  CREATININE 0.89  < > 1.04  CALCIUM 8.8*  < > 7.9*  MG 1.8  --   --   AST 44*  --   --   ALT 21  --   --   ALKPHOS 120  --   --   BILITOT 0.3  --   --   < > = values in this interval not displayed.  ASSESSMENT AND PLAN:   1.Hyponatremia  - patient on normal saline for hydration, as well as adding salt tabs to improve sodium.  -118--119--122 -Might consider nephrology consult if his sodium does not correct to baseline relatively soon. -fluid restriciton -pt non compliant and drinks lot of alcohol  2. Seizure [facility]) - patient states that this happens under what sounds like alcohol withdrawal circumstances, he will be on seizure precautions while here  3. Essential hypertension - continue home meds  4.COPD (chronic obstructive pulmonary disease) ([place]) - continue home inhaler  5. PT to see pt  6.h/o  throat cancer. Pt was treated in the past at [facility]  Case discussed with Care Management/Social Worker. Management plans discussed with the patient, family and they are in agreement.  CODE STATUS: FULL  DVT Prophylaxis: lovenox  TOTAL TIME TAKING CARE OF THIS PATIENT: 30 minutes.  >50% time spent on counselling and coordination of care  POSSIBLE D/C IN 1 DAYS, DEPENDING ON CLINICAL CONDITION.  Note: This dictation was prepared with Dragon dictation along with smaller phrase technology. Any transcriptional  errors that result from this process are unintentional.  Juanya Villavicencio M.D on 08/19/2015 at 7:54 AM  Between 7am to 6pm - Pager - [phone]  After 6pm go to [email or web address] - password [email or web address] [facility]  [place] [facility]  Office  [phone]  CC: Primary care physician; [place]

## 2015-08-19 NOTE — Care Management (Signed)
No home health needs. CSW updated about continues to drink ETOH; throat cancer. Psych consult may or may not be beneficial. Patient was cursing (routinely) while walking about the nurse station with PT today.

## 2015-08-20 ENCOUNTER — Inpatient Hospital Stay: Payer: Medicare Other

## 2015-08-20 LAB — URINALYSIS COMPLETE WITH MICROSCOPIC (ARMC ONLY)
BILIRUBIN URINE: NEGATIVE
Glucose, UA: NEGATIVE mg/dL
Ketones, ur: NEGATIVE mg/dL
NITRITE: NEGATIVE
PH: 6 (ref 5.0–8.0)
PROTEIN: NEGATIVE mg/dL
SQUAMOUS EPITHELIAL / LPF: NONE SEEN
Specific Gravity, Urine: 1.01 (ref 1.005–1.030)

## 2015-08-20 LAB — BASIC METABOLIC PANEL
ANION GAP: 10 (ref 5–15)
BUN: 8 mg/dL (ref 6–20)
CALCIUM: 8.4 mg/dL — AB (ref 8.9–10.3)
CHLORIDE: 91 mmol/L — AB (ref 101–111)
CO2: 24 mmol/L (ref 22–32)
Creatinine, Ser: 1.05 mg/dL (ref 0.61–1.24)
GFR calc Af Amer: >60 mL/min (ref >60)
GFR calc non Af Amer: >60 mL/min (ref >60)
GLUCOSE: 108 mg/dL — AB (ref 65–99)
Potassium: 3.6 mmol/L (ref 3.5–5.1)
Sodium: 125 mmol/L — ABNORMAL LOW (ref 135–145)

## 2015-08-20 LAB — CBC
HCT: 34.3 % — ABNORMAL LOW (ref 40.0–52.0)
Hemoglobin: 11.8 g/dL — ABNORMAL LOW (ref 13.0–18.0)
MCH: 29.7 pg (ref 26.0–34.0)
MCHC: 34.3 g/dL (ref 32.0–36.0)
MCV: 86.6 fL (ref 80.0–100.0)
PLATELETS: 205 10*3/uL (ref 150–440)
RBC: 3.97 MIL/uL — AB (ref 4.40–5.90)
RDW: 15 % — AB (ref 11.5–14.5)
WBC: 14.1 10*3/uL — AB (ref 3.8–10.6)

## 2015-08-20 MED ORDER — ACETAMINOPHEN 325 MG PO TABS
650.0000 mg | ORAL_TABLET | Freq: Four times a day (QID) | ORAL | Status: DC | PRN
  Administered 2015-08-20: 650 mg via ORAL
  Filled 2015-08-20: qty 2

## 2015-08-20 MED ORDER — ZOLPIDEM TARTRATE 5 MG PO TABS
5.0000 mg | ORAL_TABLET | Freq: Every evening | ORAL | Status: DC | PRN
  Administered 2015-08-20: 5 mg via ORAL
  Filled 2015-08-20: qty 1

## 2015-08-20 MED ORDER — LEVOFLOXACIN 500 MG PO TABS
500.0000 mg | ORAL_TABLET | Freq: Every day | ORAL | Status: DC
  Administered 2015-08-20: 500 mg via ORAL
  Filled 2015-08-20: qty 1

## 2015-08-20 NOTE — Care Management (Addendum)
Received callback from [phone] which happened to be patient's daughter Anne Ng who will and can provide patient transportation "if he will let me". She is aware of patient's follow up appointment. She states "his cancer is in remission and he hasn't been back". She states he "chooses to stay with friends because he doesn't want to be told what to do and [place] lets him do as he pleases". She states "he is not homeless". He lives like he wants to live.

## 2015-08-20 NOTE — Care Management Important Message (Signed)
Important Message  Patient Details  Name: Thomas [name] MRN: [id number] Date of Birth: Feb 14, 1951   Medicare Important Message Given:  Yes    Juliann Pulse A Mery Guadalupe 10/03/202017, 8:58 AM

## 2015-08-20 NOTE — Progress Notes (Signed)
[place] at [place] NAME: Thomas [name]    MR#:  [id number]  DATE OF BIRTH:  [birth year]  SUBJECTIVE:  Pt very poor historian.fever of 102 and 101 last night. RN did not call for any Tylenol order Patient denies any cough. Abdominal pain shortness of breath REVIEW OF SYSTEMS:   Review of Systems  Constitutional: Negative for fever, chills and weight loss.  HENT: Negative for ear discharge, ear pain and nosebleeds.   Eyes: Negative for blurred vision, pain and discharge.  Respiratory: Negative for sputum production, shortness of breath, wheezing and stridor.   Cardiovascular: Negative for chest pain, palpitations, orthopnea and PND.  Gastrointestinal: Negative for nausea, vomiting, abdominal pain and diarrhea.  Genitourinary: Negative for urgency and frequency.  Musculoskeletal: Negative for back pain and joint pain.  Neurological: Positive for weakness. Negative for sensory change, speech change and focal weakness.  Psychiatric/Behavioral: Negative for depression and hallucinations. The patient is nervous/anxious.   All other systems reviewed and are negative.  Tolerating Diet:yes Tolerating PT: No PT needs at home DRUG ALLERGIES:  No Known Allergies  VITALS:  Blood pressure 134/69, pulse 106, temperature 99.1 F (37.3 C), temperature source Axillary, resp. rate 18, height 5\' 7"  (1.702 m), weight 61.735 kg (136 lb 1.6 oz), SpO2 98 %.  PHYSICAL EXAMINATION:   Physical Exam  GENERAL:  65 y.o.-year-old patient lying in the bed with no acute distress. dishevled EYES: Pupils equal, round, reactive to light and accommodation. No scleral icterus. Extraocular muscles intact.  HEENT: Head atraumatic, normocephalic. Oropharynx and nasopharynx clear.  NECK:  Supple, no jugular venous distention. No thyroid enlargement, no tenderness.  LUNGS: Normal breath sounds bilaterally, no wheezing, rales, rhonchi. No use of accessory muscles of  respiration.  CARDIOVASCULAR: S1, S2 normal. No murmurs, rubs, or gallops.  ABDOMEN: Soft, nontender, nondistended.Bowel sounds present. No organomegaly or mass.  EXTREMITIES: No cyanosis, clubbing or edema b/l.    NEUROLOGIC: Cranial nerves II through XII are intact. No focal Motor or sensory deficits b/l.   PSYCHIATRIC: patient is alert and oriented x 3.  SKIN: No obvious rash, lesion, or ulcer.   LABORATORY PANEL:  CBC  Recent Labs Lab 08/20/15 0603  WBC 14.1*  HGB 11.8*  HCT 34.3*  PLT 205    Chemistries   Recent Labs Lab 08/17/15 1858  08/20/15 0603  NA 112*  < > 125*  K 4.2  < > 3.6  CL 78*  < > 91*  CO2 26  < > 24  GLUCOSE 106*  < > 108*  BUN 7  < > 8  CREATININE 0.89  < > 1.05  CALCIUM 8.8*  < > 8.4*  MG 1.8  --   --   AST 44*  --   --   ALT 21  --   --   ALKPHOS 120  --   --   BILITOT 0.3  --   --   < > = values in this interval not displayed.  ASSESSMENT AND PLAN:   1.Hyponatremia  - patient on normal saline for hydration, as well as adding salt tabs to improve sodium.  -118--119--122--125. -fluid restriciton -pt non compliant and drinks lot of alcohol  2. Fever 101 - 102 -We'll do fever workup with chest x-ray UA urine culture and blood culture -. White count elevated to 14,000 -We'll start empirically IV Levaquin.  3. Essential hypertension - continue home meds  4.COPD (chronic obstructive pulmonary disease) ([place]) -  continue home inhaler  5. PT recommends no physical therapy.  6.h/o throat cancer. Pt was [facility] in the past at [facility]  Case discussed with Care Management/Social Worker. Management plans discussed with the patient, family and they are in agreement.  CODE STATUS: FULL  DVT Prophylaxis: lovenox  TOTAL TIME TAKING CARE OF THIS PATIENT: 30 minutes.  >50% time spent on counselling and coordination of care  POSSIBLE D/C IN 1 DAYS, DEPENDING ON CLINICAL CONDITION.  Note: This dictation was prepared with Dragon dictation along  with smaller phrase technology. Any transcriptional errors that result from this process are unintentional.  Crescencio Jozwiak M.D on 03-May-202017 at 8:44 AM  Between 7am to 6pm - Pager - [phone]  After 6pm go to [email or web address] - password [email or web address] [facility]  [place] [facility]  Office  [phone]  CC: Primary care physician; [place]

## 2015-08-20 NOTE — Progress Notes (Signed)
MD notified of fevers during night.  She requested that I give tylenol now

## 2015-08-20 NOTE — Progress Notes (Signed)
Spoke with Dr. Lavetta Nielsen pt would like something to help him sleep. Md to place order.

## 2015-08-20 NOTE — Care Management (Signed)
Attempted to meet with patient again- he kept falling asleep. I did find out that he sees Dr. Iona Beard with [facility]. Last seen 06/15/14 by DR. Iona Beard. Emergency contact [phone]. Follow up appointment made with DR. Aldridge at [facility] (DR. Iona Beard out on  Maternity leave). Patient said he has "a place to stay when he leaves- with Johnny" but would not give a contact number. Confidential message left at [phone] asking if they had a family/friend admitted to [facility].

## 2015-08-21 LAB — SODIUM: SODIUM: 125 mmol/L — AB (ref 135–145)

## 2015-08-21 MED ORDER — FOLIC ACID 1 MG PO TABS: 1.0000 mg | ORAL_TABLET | Freq: Every day | ORAL | Status: DC

## 2015-08-21 MED ORDER — CEPHALEXIN 500 MG PO CAPS
500.0000 mg | ORAL_CAPSULE | Freq: Two times a day (BID) | ORAL | Status: DC
  Administered 2015-08-21: 500 mg via ORAL
  Filled 2015-08-21: qty 1

## 2015-08-21 MED ORDER — CEPHALEXIN 500 MG PO CAPS: 500.0000 mg | ORAL_CAPSULE | Freq: Four times a day (QID) | ORAL | Status: DC

## 2015-08-21 MED ORDER — ADULT MULTIVITAMIN W/MINERALS CH
1.0000 | ORAL_TABLET | Freq: Every day | ORAL | Status: DC
Start: 2015-08-21 — End: 2016-02-07

## 2015-08-21 NOTE — Discharge Instructions (Signed)
STOP DRINKING Alcohol
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
## 2015-08-22 LAB — URINE CULTURE: Culture: >=100000

## 2015-08-25 LAB — CULTURE, BLOOD (ROUTINE X 2)
Culture: NO GROWTH
Culture: NO GROWTH

## 2016-01-22 ENCOUNTER — Inpatient Hospital Stay
Admission: EM | Admit: 2016-01-22 | Discharge: 2016-01-26 | DRG: 643 | Disposition: A | Discharge status: Skilled Nursing Facility | Payer: Medicare Other | Attending: Internal Medicine | Admitting: Internal Medicine

## 2016-01-22 DIAGNOSIS — Z8249 Family history of ischemic heart disease and other diseases of the circulatory system: Secondary | ICD-10-CM

## 2016-01-22 DIAGNOSIS — W19XXXA Unspecified fall, initial encounter: Secondary | ICD-10-CM

## 2016-01-22 DIAGNOSIS — E039 Hypothyroidism, unspecified: Secondary | ICD-10-CM | POA: Diagnosis present

## 2016-01-22 DIAGNOSIS — R509 Fever, unspecified: Secondary | ICD-10-CM

## 2016-01-22 DIAGNOSIS — R946 Abnormal results of thyroid function studies: Secondary | ICD-10-CM | POA: Diagnosis present

## 2016-01-22 DIAGNOSIS — Z9889 Other specified postprocedural states: Secondary | ICD-10-CM

## 2016-01-22 DIAGNOSIS — E222 Syndrome of inappropriate secretion of antidiuretic hormone: Principal | ICD-10-CM | POA: Diagnosis present

## 2016-01-22 DIAGNOSIS — C01 Malignant neoplasm of base of tongue: Secondary | ICD-10-CM | POA: Diagnosis present

## 2016-01-22 DIAGNOSIS — J440 Chronic obstructive pulmonary disease with acute lower respiratory infection: Secondary | ICD-10-CM | POA: Diagnosis present

## 2016-01-22 DIAGNOSIS — Z8522 Personal history of malignant neoplasm of nasal cavities, middle ear, and accessory sinuses: Secondary | ICD-10-CM

## 2016-01-22 DIAGNOSIS — E871 Hypo-osmolality and hyponatremia: Secondary | ICD-10-CM | POA: Diagnosis present

## 2016-01-22 DIAGNOSIS — Z85828 Personal history of other malignant neoplasm of skin: Secondary | ICD-10-CM

## 2016-01-22 DIAGNOSIS — E43 Unspecified severe protein-calorie malnutrition: Secondary | ICD-10-CM | POA: Diagnosis present

## 2016-01-22 DIAGNOSIS — J189 Pneumonia, unspecified organism: Secondary | ICD-10-CM | POA: Diagnosis present

## 2016-01-22 DIAGNOSIS — Z9221 Personal history of antineoplastic chemotherapy: Secondary | ICD-10-CM | POA: Diagnosis not present

## 2016-01-22 DIAGNOSIS — R591 Generalized enlarged lymph nodes: Secondary | ICD-10-CM | POA: Diagnosis present

## 2016-01-22 DIAGNOSIS — G4700 Insomnia, unspecified: Secondary | ICD-10-CM | POA: Diagnosis present

## 2016-01-22 DIAGNOSIS — M25559 Pain in unspecified hip: Secondary | ICD-10-CM

## 2016-01-22 DIAGNOSIS — F1010 Alcohol abuse, uncomplicated: Secondary | ICD-10-CM | POA: Diagnosis present

## 2016-01-22 DIAGNOSIS — Z681 Body mass index (BMI) 19 or less, adult: Secondary | ICD-10-CM | POA: Diagnosis not present

## 2016-01-22 DIAGNOSIS — F17210 Nicotine dependence, cigarettes, uncomplicated: Secondary | ICD-10-CM | POA: Diagnosis present

## 2016-01-22 DIAGNOSIS — I10 Essential (primary) hypertension: Secondary | ICD-10-CM | POA: Diagnosis present

## 2016-01-22 LAB — COMPREHENSIVE METABOLIC PANEL
ALK PHOS: 106 U/L (ref 38–126)
ALT: 60 U/L (ref 17–63)
AST: 91 U/L — AB (ref 15–41)
Albumin: 4.8 g/dL (ref 3.5–5.0)
Anion gap: 13 (ref 5–15)
BILIRUBIN TOTAL: 0.6 mg/dL (ref 0.3–1.2)
BUN: 6 mg/dL (ref 6–20)
CALCIUM: 9.5 mg/dL (ref 8.9–10.3)
CO2: 27 mmol/L (ref 22–32)
CREATININE: 1.12 mg/dL (ref 0.61–1.24)
Chloride: 75 mmol/L — ABNORMAL LOW (ref 101–111)
GFR calc Af Amer: >60 mL/min (ref >60)
Glucose, Bld: 93 mg/dL (ref 65–99)
POTASSIUM: 4.3 mmol/L (ref 3.5–5.1)
Sodium: 115 mmol/L — CL (ref 135–145)
TOTAL PROTEIN: 8 g/dL (ref 6.5–8.1)

## 2016-01-22 LAB — URINE DRUG SCREEN, QUALITATIVE (ARMC ONLY)
Amphetamines, Ur Screen: NOT DETECTED
BARBITURATES, UR SCREEN: NOT DETECTED
BENZODIAZEPINE, UR SCRN: NOT DETECTED
CANNABINOID 50 NG, UR ~~LOC~~: NOT DETECTED
Cocaine Metabolite,Ur ~~LOC~~: NOT DETECTED
MDMA (Ecstasy)Ur Screen: NOT DETECTED
METHADONE SCREEN, URINE: NOT DETECTED
Opiate, Ur Screen: NOT DETECTED
Phencyclidine (PCP) Ur S: NOT DETECTED
TRICYCLIC, UR SCREEN: NOT DETECTED

## 2016-01-22 LAB — CBC
HCT: 37.8 % — ABNORMAL LOW (ref 40.0–52.0)
Hemoglobin: 13 g/dL (ref 13.0–18.0)
MCH: 30.9 pg (ref 26.0–34.0)
MCHC: 34.4 g/dL (ref 32.0–36.0)
MCV: 89.7 fL (ref 80.0–100.0)
PLATELETS: 179 10*3/uL (ref 150–440)
RBC: 4.22 MIL/uL — ABNORMAL LOW (ref 4.40–5.90)
RDW: 17.6 % — AB (ref 11.5–14.5)
WBC: 5.2 10*3/uL (ref 3.8–10.6)

## 2016-01-22 LAB — ACETAMINOPHEN LEVEL: Acetaminophen (Tylenol), Serum: <10 ug/mL — ABNORMAL LOW (ref 10–30)

## 2016-01-22 LAB — TSH: TSH: 32.446 u[IU]/mL — ABNORMAL HIGH (ref 0.350–4.500)

## 2016-01-22 LAB — SALICYLATE LEVEL

## 2016-01-22 LAB — ETHANOL: ALCOHOL ETHYL (B): 174 mg/dL — AB (ref <5)

## 2016-01-22 LAB — SODIUM
SODIUM: 120 mmol/L — AB (ref 135–145)
SODIUM: 120 mmol/L — AB (ref 135–145)
Sodium: 121 mmol/L — ABNORMAL LOW (ref 135–145)

## 2016-01-22 LAB — HEMOGLOBIN A1C: Hgb A1c MFr Bld: 4.7 % (ref 4.0–6.0)

## 2016-01-22 LAB — T4, FREE: Free T4: 0.62 ng/dL (ref 0.61–1.12)

## 2016-01-22 MED ORDER — ACETAMINOPHEN 325 MG PO TABS
650.0000 mg | ORAL_TABLET | Freq: Four times a day (QID) | ORAL | Status: DC | PRN
  Administered 2016-01-24 – 2016-01-25 (×2): 650 mg via ORAL
  Filled 2016-01-22 (×2): qty 2

## 2016-01-22 MED ORDER — LORAZEPAM 2 MG/ML IJ SOLN: 0.0000 mg | Freq: Four times a day (QID) | INTRAMUSCULAR | Status: DC

## 2016-01-22 MED ORDER — VITAMIN B-1 100 MG PO TABS: 100.0000 mg | ORAL_TABLET | Freq: Every day | ORAL | Status: DC

## 2016-01-22 MED ORDER — LEVOTHYROXINE SODIUM 50 MCG PO TABS
50.0000 ug | ORAL_TABLET | Freq: Every day | ORAL | Status: DC
  Administered 2016-01-23 – 2016-01-26 (×4): 50 ug via ORAL
  Filled 2016-01-22 (×4): qty 1

## 2016-01-22 MED ORDER — ONDANSETRON HCL 4 MG PO TABS: 4.0000 mg | ORAL_TABLET | Freq: Four times a day (QID) | ORAL | Status: DC | PRN

## 2016-01-22 MED ORDER — SODIUM CHLORIDE 0.9 % IV SOLN
INTRAVENOUS | Status: DC
  Administered 2016-01-22 – 2016-01-24 (×4): via INTRAVENOUS

## 2016-01-22 MED ORDER — LORAZEPAM 2 MG/ML IJ SOLN
0.0000 mg | Freq: Four times a day (QID) | INTRAMUSCULAR | Status: AC
  Administered 2016-01-22 – 2016-01-23 (×5): 2 mg via INTRAVENOUS
  Filled 2016-01-22 (×5): qty 1

## 2016-01-22 MED ORDER — M.V.I. ADULT IV INJ
INJECTION | Freq: Once | INTRAVENOUS | Status: AC
  Administered 2016-01-22: 06:00:00 via INTRAVENOUS
  Filled 2016-01-22: qty 1000

## 2016-01-22 MED ORDER — ADULT MULTIVITAMIN W/MINERALS CH: 1.0000 | ORAL_TABLET | Freq: Every day | ORAL | Status: DC

## 2016-01-22 MED ORDER — ONDANSETRON HCL 4 MG/2ML IJ SOLN
4.0000 mg | Freq: Four times a day (QID) | INTRAMUSCULAR | Status: DC | PRN
  Administered 2016-01-23: 08:00:00 4 mg via INTRAVENOUS
  Filled 2016-01-22: qty 2

## 2016-01-22 MED ORDER — TRAZODONE HCL 100 MG PO TABS
100.0000 mg | ORAL_TABLET | Freq: Every day | ORAL | Status: DC
  Administered 2016-01-22: 100 mg via ORAL
  Filled 2016-01-22: qty 1

## 2016-01-22 MED ORDER — VITAMIN B-1 100 MG PO TABS
100.0000 mg | ORAL_TABLET | Freq: Every day | ORAL | Status: DC
  Administered 2016-01-22 – 2016-01-26 (×5): 100 mg via ORAL
  Filled 2016-01-22 (×5): qty 1

## 2016-01-22 MED ORDER — LORAZEPAM 2 MG/ML IJ SOLN
1.0000 mg | Freq: Four times a day (QID) | INTRAMUSCULAR | Status: AC | PRN
  Administered 2016-01-22 – 2016-01-23 (×3): 1 mg via INTRAVENOUS
  Filled 2016-01-22 (×4): qty 1

## 2016-01-22 MED ORDER — FOLIC ACID 1 MG PO TABS: 1.0000 mg | ORAL_TABLET | Freq: Every day | ORAL | Status: DC

## 2016-01-22 MED ORDER — ACETAMINOPHEN 650 MG RE SUPP: 650.0000 mg | Freq: Four times a day (QID) | RECTAL | Status: DC | PRN

## 2016-01-22 MED ORDER — DOCUSATE SODIUM 100 MG PO CAPS
100.0000 mg | ORAL_CAPSULE | Freq: Two times a day (BID) | ORAL | Status: DC
  Administered 2016-01-22 – 2016-01-26 (×9): 100 mg via ORAL
  Filled 2016-01-22 (×8): qty 1

## 2016-01-22 MED ORDER — TIOTROPIUM BROMIDE MONOHYDRATE 18 MCG IN CAPS
18.0000 ug | ORAL_CAPSULE | Freq: Every day | RESPIRATORY_TRACT | Status: DC
  Administered 2016-01-22 – 2016-01-26 (×5): 18 ug via RESPIRATORY_TRACT
  Filled 2016-01-22: qty 5

## 2016-01-22 MED ORDER — LORAZEPAM 1 MG PO TABS
1.0000 mg | ORAL_TABLET | Freq: Four times a day (QID) | ORAL | Status: AC | PRN
  Administered 2016-01-22: 09:00:00 1 mg via ORAL
  Filled 2016-01-22: qty 1

## 2016-01-22 MED ORDER — HYDRALAZINE HCL 20 MG/ML IJ SOLN: 10.0000 mg | Freq: Four times a day (QID) | INTRAMUSCULAR | Status: DC | PRN

## 2016-01-22 MED ORDER — ADULT MULTIVITAMIN W/MINERALS CH
1.0000 | ORAL_TABLET | Freq: Every day | ORAL | Status: DC
  Administered 2016-01-22 – 2016-01-26 (×5): 1 via ORAL
  Filled 2016-01-22 (×5): qty 1

## 2016-01-22 MED ORDER — LORAZEPAM 2 MG/ML IJ SOLN
0.0000 mg | Freq: Two times a day (BID) | INTRAMUSCULAR | Status: AC
  Administered 2016-01-24: 09:00:00 1 mg via INTRAVENOUS

## 2016-01-22 MED ORDER — MORPHINE SULFATE (PF) 2 MG/ML IV SOLN: 2.0000 mg | INTRAVENOUS | Status: DC | PRN

## 2016-01-22 MED ORDER — THIAMINE HCL 100 MG/ML IJ SOLN: 100.0000 mg | Freq: Every day | INTRAMUSCULAR | Status: DC

## 2016-01-22 MED ORDER — ENSURE ENLIVE PO LIQD
237.0000 mL | Freq: Two times a day (BID) | ORAL | Status: DC
  Administered 2016-01-22 – 2016-01-26 (×9): 237 mL via ORAL

## 2016-01-22 MED ORDER — FOLIC ACID 1 MG PO TABS
1.0000 mg | ORAL_TABLET | Freq: Every day | ORAL | Status: DC
  Administered 2016-01-22 – 2016-01-26 (×5): 1 mg via ORAL
  Filled 2016-01-22 (×5): qty 1

## 2016-01-22 MED ORDER — ENOXAPARIN SODIUM 40 MG/0.4ML ~~LOC~~ SOLN
40.0000 mg | SUBCUTANEOUS | Status: DC
  Administered 2016-01-22 – 2016-01-25 (×4): 40 mg via SUBCUTANEOUS
  Filled 2016-01-22 (×4): qty 0.4

## 2016-01-22 MED ORDER — SODIUM CHLORIDE 0.9 % IV BOLUS (SEPSIS)
1000.0000 mL | Freq: Once | INTRAVENOUS | Status: AC
  Administered 2016-01-22: 1000 mL via INTRAVENOUS

## 2016-01-22 MED ORDER — AMLODIPINE BESYLATE 5 MG PO TABS
5.0000 mg | ORAL_TABLET | Freq: Every day | ORAL | Status: DC
  Administered 2016-01-22 – 2016-01-24 (×3): 5 mg via ORAL
  Filled 2016-01-22 (×3): qty 1

## 2016-01-22 NOTE — Progress Notes (Signed)
[place] [facility] [place] NAME: Thomas [name]    MR#:  [id number]  DATE OF BIRTH:  May 30, 1951  SUBJECTIVE:  CHIEF COMPLAINT:   Chief Complaint  Patient presents with  . Alcohol Problem   - admitted with weakness, noted to have sodium of 115, significant alcohol use - on fluids, complains of insomnia  REVIEW OF SYSTEMS:  Review of Systems  Constitutional: Positive for malaise/fatigue. Negative for fever and chills.  HENT: Negative for ear discharge, ear pain and nosebleeds.   Eyes: Negative for blurred vision and double vision.  Respiratory: Negative for cough, shortness of breath and wheezing.   Cardiovascular: Negative for chest pain and palpitations.  Gastrointestinal: Negative for nausea, vomiting, abdominal pain, diarrhea and constipation.  Genitourinary: Negative for dysuria and urgency.  Musculoskeletal: Positive for myalgias. Negative for back pain and neck pain.  Neurological: Negative for dizziness, sensory change, speech change, focal weakness, seizures and headaches.  Psychiatric/Behavioral: Negative for depression. The patient has insomnia.     DRUG ALLERGIES:  No Known Allergies  VITALS:  Blood pressure 142/81, pulse 86, temperature 97.9 F (36.6 C), temperature source Oral, resp. rate 20, height 5\' 7"  (1.702 m), weight 57.153 kg (126 lb), SpO2 100 %.  PHYSICAL EXAMINATION:  Physical Exam  GENERAL:  65 y.o.-year-old disshelved patient lying in the bed with no acute distress.  EYES: Pupils equal, round, reactive to light and accommodation. No scleral icterus. Extraocular muscles intact.  HEENT: Head atraumatic, normocephalic. Oropharynx and nasopharynx clear.  NECK:  Supple, no jugular venous distention. No thyroid enlargement, no tenderness.  LUNGS: Normal breath sounds bilaterally, no wheezing, rales,rhonchi or crepitation. No use of accessory muscles of respiration.  CARDIOVASCULAR: S1, S2 normal. No murmurs, rubs,  or gallops.  ABDOMEN: Soft, nontender, nondistended. Bowel sounds present. No organomegaly or mass.  EXTREMITIES: No pedal edema, cyanosis, or clubbing.  NEUROLOGIC: Cranial nerves II through XII are intact. Muscle strength 5/5 in all extremities. Sensation intact. Gait not checked.  PSYCHIATRIC: The patient is alert and oriented x 3.  SKIN: No obvious rash, lesion, or ulcer.    LABORATORY PANEL:   CBC  Recent Labs Lab 01/22/16 0400  WBC 5.2  HGB 13.0  HCT 37.8*  PLT 179   ------------------------------------------------------------------------------------------------------------------  Chemistries   Recent Labs Lab 01/22/16 0400  NA 115*  K 4.3  CL 75*  CO2 27  GLUCOSE 93  BUN 6  CREATININE 1.12  CALCIUM 9.5  AST 91*  ALT 60  ALKPHOS 106  BILITOT 0.6   ------------------------------------------------------------------------------------------------------------------  Cardiac Enzymes No results for input(s): TROPONINI in the last 168 hours. ------------------------------------------------------------------------------------------------------------------  RADIOLOGY:  No results found.  EKG:   Orders placed or performed during the hospital encounter of 12/11/14  . EKG (if new onset seizures)  . EKG (if new onset seizures)  . EKG 12-Lead  . EKG 12-Lead  . EKG 12-Lead  . EKG 12-Lead  . EKG    ASSESSMENT AND PLAN:   65y/o M with PMH of alcohol use, HTN presents to the hospital secondary to secondary to weakness and noted to have hyponatremia.  #1 Acute on chronic hyponatremia- baseline sodium about 125-133 based on prior admissions Admitting sodium at 115- IV fluids as decreased PO intake and monitor sodium q6h -stable mental status for now  #2 Hypothyroidism- TSH elevated, check free t4 - start low dose synthroid  #3 Alcohol abuse- CIWA protocol  #4 Insomnia- add trazodone  #5 DVT prophylaxis- lovenox  All the records are reviewed and case  discussed with Care Management/Social Workerr. Management plans discussed with the patient, family and they are in agreement.  CODE STATUS: Full Code  TOTAL TIME TAKING CARE OF THIS PATIENT: 37 minutes.   POSSIBLE D/C IN 1-2 DAYS, DEPENDING ON CLINICAL CONDITION.   [name] M.D on 01/22/2016 at 10:55 AM  Between 7am to 6pm - Pager - [phone]  After 6pm go to [email or web address] - password [email or web address] [facility]  [place] [facility]  Office  [phone]  CC: Primary care physician; [place]

## 2016-01-22 NOTE — Clinical Social Work Note (Signed)
Clinical Social Work Assessment  Patient Details  Name: Thomas [name] MRN: [id number] Date of Birth: [birth year]  Date of referral:  01/22/16               Reason for consult:  Housing Concerns/Homelessness, Substance Use/ETOH Abuse                Permission sought to share information with:    Permission granted to share information::     Name::        Agency::     Relationship::     Contact Information:     Housing/Transportation Living arrangements for the past 2 months:  [place], Homeless Source of Information:  Patient Patient Interpreter Needed:  None Criminal Activity/Legal Involvement Pertinent to Current Situation/Hospitalization:  No - Comment as needed Significant Relationships:    Lives with:  Self Do you feel safe going back to the place where you live?  Yes Need for family participation in patient care:  No (Coment)  Care giving concerns:  Per patient he stays with different friends and "couch surfrs."   Social Worker assessment / plan:  Holiday representative (CSW) received consult for homelessness. CSW met with patient to address consult. Patient was alert and oriented and was laying in the bed. Patient reported that he is staying with friends and does not have a permanent residence. Patient reported that his income consists of a monthly social security check. Patient denied drug use and reported that he does drink alcohol. CSW discussed housing options including [employer]. At first patient reported that he has never stayed at the shelter however he later stated that he stayed at the shelter once and did not like it because people were drinking and being loud there. CSW encouraged patient to come up with a plan for when he leaves the hospital. CSW provided patient with housing and substance abuse resources. CSW also discussed inpatient alcohol rehab options however patient declined. CSW will continue to follow and assist as needed.     Employment  status:  Disabled (Comment on whether or not currently receiving Disability) Insurance information:  Medicare, Medicaid In [place] PT Recommendations:  Not assessed at this time Information / Referral to community resources:  Outpatient Substance Abuse Treatment Options  Patient/Family's Response to care:  Patient stated that he would review the resources provided.   Patient/Family's Understanding of and Emotional Response to Diagnosis, Current Treatment, and Prognosis:  Patient did not appear very motivated to come up with a plan for when he leaves the hospital.   Emotional Assessment Appearance:  Appears stated age Attitude/Demeanor/Rapport:    Affect (typically observed):  Accepting, Adaptable, Pleasant Orientation:  Oriented to Self, Oriented to Place, Oriented to  Time, Oriented to Situation Alcohol / Substance use:  Alcohol Use Psych involvement (Current and /or in the community):  No (Comment)  Discharge Needs  Concerns to be addressed:  Discharge Planning Concerns Readmission within the last 30 days:  No Current discharge risk:  Inadequate Financial Supports Barriers to Discharge:  Continued Medical Work up   Loralyn Freshwater, LCSW 01/22/2016, 2:17 PM

## 2016-01-22 NOTE — Progress Notes (Signed)
Initial Nutrition Assessment  DOCUMENTATION CODES:   Severe malnutrition in context of social or environmental circumstances  INTERVENTION:   Cater to pt preferences. Recommend downgrading diet order to Dysphagia III for ease of chewing/swallowing. If pt at risk for aspiration, recommend SLP evaulation. Recommend Ensure Enlive po BID, each supplement provides 350 kcal and 20 grams of protein   NUTRITION DIAGNOSIS:   Malnutrition related to social / environmental circumstances as evidenced by severe depletion of muscle mass, severe depletion of body fat, energy intake < or equal to 50% for > or equal to 5 days.  GOAL:   Patient will meet greater than or equal to 90% of their needs  MONITOR:   PO intake, Supplement acceptance, Labs, Weight trends, I & O's  REASON FOR ASSESSMENT:   Malnutrition Screening Tool    ASSESSMENT:   Pt admitted with hyponatremia and for EtOH detox. Pt currently on CIWA.  Past Medical History  Diagnosis Date  . Hypertension   . 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 Enlarged prostate   . Tongue cancer ([place])   . Renal disorder   . Lung infection     no lung cancer  . Radiation     to neck  . Status post chemotherapy   . Skin cancer     Diet Order:  DIET DYS 3 Room service appropriate?: Yes; Fluid consistency:: Thin   Pt reports eating 2 bites of breakfast this morning.  Coffee at bedside had not been touched.  Pt reports he has not eaten anything in 10-14 days. Pt reports has no appetite and was drinking.  Per MD note in ED, pt was consuming 3-4 40oz beers, 1-2 bottles of liquor and 1-2 bottles of wine daily PTA. Pt admitted to binge drinking the past 10-14 days and not eating for one week per note.  Pt reports some difficulty swallowing at times since his cancer treatments.   Medications: Colace, Folic acid, MVI, thiamine, NS at 142mL/hr Labs: Na 115   Gastrointestinal Profile: Last BM:  01/21/2016    Nutrition-Focused Physical Exam  Findings: Nutrition-Focused physical exam completed. Findings are moderate-severe fat depletion, moderate-severe muscle depletion, and no edema.     Weight Change: Pt reports weight of 147lbs and has lost probably 25lbs in the past '2 weeks or 20 days' Per [facility] weight trend 7% weight loss since January.   Skin:  Reviewed, no issues   Height:   Ht Readings from Last 1 Encounters:  01/22/16 5\' 7"  (1.702 m)    Weight:   Wt Readings from Last 1 Encounters:  01/22/16 126 lb (57.153 kg)   Wt Readings from Last 10 Encounters:  01/22/16 126 lb (57.153 kg)  08/17/15 136 lb 1.6 oz (61.735 kg)  01/01/15 130 lb (58.968 kg)  12/12/14 122 lb 8 oz (55.566 kg)  06/02/14 121 lb 7 oz (55.084 kg)   BMI:  Body mass index is 19.73 kg/(m^2).  Estimated Nutritional Needs:   Kcal:  1710-1995kcals  Protein:  68-85g protein  Fluid:  >/= 1.8L fluid  EDUCATION NEEDS:   Education needs no appropriate at this time  Dwyane Luo, RD, LDN Pager [phone] Weekend/On-Call Pager [phone]

## 2016-01-22 NOTE — ED Notes (Signed)
Patient significant hyponatremic. He presents hypertensive and is intoxicated. Patient moved to ED 25 in order to be medically monitored with continuous cardiac monitoring. Patient's acuity changed to reflect clinical presentation.

## 2016-01-22 NOTE — H&P (Signed)
Thomas [name] is an 65 y.o. male.   Chief Complaint: Alcohol problem HPI: The patient with past medical history of abuse presents to emergency department seeking detox. He denies pain but he admits to not eating for at least one week. He states he has been drinking large amounts of output for at least 2 weeks. Laboratory evaluation in the emergency department revealed hyponatremia. The patient was started on a banana bag and allowed to eat prior to the emergency department staff contacted the hospitalist service for management.  Past Medical History  Diagnosis Date  . Hypertension   . 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 Enlarged prostate   . Tongue cancer ([place])   . Renal disorder   . Lung infection     no lung cancer  . Radiation     to neck  . Status post chemotherapy   . Skin cancer     Past Surgical History  Procedure Laterality Date  . Cancerous lymph nodes removed from neck    . Knee surgery Right   . Skin biopsy      Family History  Problem Relation Age of Onset  . Hypertension Mother    Social History:  reports that he has been smoking Cigarettes.  He does not have any smokeless tobacco history on file. He reports that he drinks alcohol. He reports that he does not use illicit drugs.  Allergies: No Known Allergies  Medications Prior to Admission  Medication Sig Dispense Refill  . cephALEXin (KEFLEX) 500 MG capsule Take 1 capsule (500 mg total) by mouth 4 (four) times daily. (Patient not taking: Reported on 01/22/2016) 12 capsule 0  . folic acid (FOLVITE) 1 MG tablet Take 1 tablet (1 mg total) by mouth daily. (Patient not taking: Reported on 01/22/2016) 30 tablet 3  . Multiple Vitamin (MULTIVITAMIN WITH MINERALS) TABS tablet Take 1 tablet by mouth daily. (Patient not taking: Reported on 01/22/2016) 30 tablet 0    Results for orders placed or performed during the hospital encounter of 01/22/16 (from the past 48 hour(s))  Comprehensive metabolic panel     Status: Abnormal    Collection Time: 01/22/16  4:00 AM  Result Value Ref Range   Sodium 115 (LL) 135 - 145 mmol/L    Comment: CRITICAL RESULT CALLED TO, READ BACK BY AND VERIFIED WITH IRIS GUIDRY AT 0422 01/22/16.PMH   Potassium 4.3 3.5 - 5.1 mmol/L   Chloride 75 (L) 101 - 111 mmol/L   CO2 27 22 - 32 mmol/L   Glucose, Bld 93 65 - 99 mg/dL   BUN 6 6 - 20 mg/dL   Creatinine, Ser 1.12 0.61 - 1.24 mg/dL   Calcium 9.5 8.9 - 10.3 mg/dL   Total Protein 8.0 6.5 - 8.1 g/dL   Albumin 4.8 3.5 - 5.0 g/dL   AST 91 (H) 15 - 41 U/L   ALT 60 17 - 63 U/L   Alkaline Phosphatase 106 38 - 126 U/L   Total Bilirubin 0.6 0.3 - 1.2 mg/dL   GFR calc non Af Amer >60 >60 mL/min   GFR calc Af Amer >60 >60 mL/min    Comment: (NOTE) The eGFR has been calculated using the CKD EPI equation. This calculation has not been validated in all clinical situations. eGFR's persistently <60 mL/min signify possible Chronic Kidney Disease.    Anion gap 13 5 - 15  Ethanol     Status: Abnormal   Collection Time: 01/22/16  4:00 AM  Result Value Ref  Range   Alcohol, Ethyl (B) 174 (H) <5 mg/dL    Comment:        LOWEST DETECTABLE LIMIT FOR SERUM ALCOHOL IS 5 mg/dL FOR MEDICAL PURPOSES ONLY   cbc     Status: Abnormal   Collection Time: 01/22/16  4:00 AM  Result Value Ref Range   WBC 5.2 3.8 - 10.6 K/uL   RBC 4.22 (L) 4.40 - 5.90 MIL/uL   Hemoglobin 13.0 13.0 - 18.0 g/dL   HCT 37.8 (L) 40.0 - 52.0 %   MCV 89.7 80.0 - 100.0 fL   MCH 30.9 26.0 - 34.0 pg   MCHC 34.4 32.0 - 36.0 g/dL   RDW 17.6 (H) 11.5 - 14.5 %   Platelets 179 150 - 440 K/uL  Acetaminophen level     Status: Abnormal   Collection Time: 01/22/16  4:00 AM  Result Value Ref Range   Acetaminophen (Tylenol), Serum <10 (L) 10 - 30 ug/mL    Comment:        THERAPEUTIC CONCENTRATIONS VARY SIGNIFICANTLY. A RANGE OF 10-30 ug/mL MAY BE AN EFFECTIVE CONCENTRATION FOR MANY PATIENTS. HOWEVER, SOME ARE BEST TREATED AT CONCENTRATIONS OUTSIDE THIS RANGE. ACETAMINOPHEN  CONCENTRATIONS >150 ug/mL AT 4 HOURS AFTER INGESTION AND >50 ug/mL AT 12 HOURS AFTER INGESTION ARE OFTEN ASSOCIATED WITH TOXIC REACTIONS.   Salicylate level     Status: None   Collection Time: 01/22/16  4:00 AM  Result Value Ref Range   Salicylate Lvl <1.6 2.8 - 30.0 mg/dL   No results found.  Review of Systems  Constitutional: Negative for fever and chills.  HENT: Negative for sore throat and tinnitus.   Eyes: Negative for blurred vision, photophobia and redness.  Respiratory: Negative for cough and shortness of breath.   Cardiovascular: Negative for chest pain, palpitations, orthopnea and PND.  Gastrointestinal: Negative for nausea, vomiting, abdominal pain and diarrhea.  Genitourinary: Negative for dysuria, urgency and frequency.  Musculoskeletal: Negative for myalgias and joint pain.  Skin: Negative for rash.       No lesions  Neurological: Negative for speech change, focal weakness and weakness.  Endo/Heme/Allergies: Does not bruise/bleed easily.       No temperature intolerance  Psychiatric/Behavioral: Negative for depression and suicidal ideas.    Blood pressure 166/89, pulse 84, resp. rate 20, height 5' 7"  (1.702 m), weight 63.504 kg (140 lb), SpO2 98 %. Physical Exam  Constitutional: He is oriented to person, place, and time. He appears well-developed and well-nourished. No distress.  HENT:  Head: Normocephalic and atraumatic.  Mouth/Throat: Oropharynx is clear and moist.  Eyes: Conjunctivae are normal. Pupils are equal, round, and reactive to light. No scleral icterus.  Neck: Normal range of motion. Neck supple. No JVD present. No tracheal deviation present. No thyromegaly present.  Cardiovascular: Normal rate, regular rhythm and normal heart sounds.  Exam reveals no gallop and no friction rub.   No murmur heard. Respiratory: Effort normal and breath sounds normal. No respiratory distress. He has no wheezes.  GI: Soft. Bowel sounds are normal. He exhibits no  distension. There is no tenderness.  Genitourinary:  Deferred  Musculoskeletal: Normal range of motion. He exhibits no edema.  Lymphadenopathy:    He has no cervical adenopathy.  Neurological: He is alert and oriented to person, place, and time. No cranial nerve deficit.  Skin: Skin is warm and dry. No rash noted. No erythema.  Psychiatric: He has a normal mood and affect. His behavior is normal. Judgment and thought content normal.  Assessment/Plan This is a 65 year old male admitted for hyponatremia and alcohol detoxification. 1. Hyponatremia: Secondary to pulmonary. Encourage by mouth intake. Hydrate with normal saline. Check sodium twice a day 2. Alcohol abuse: CIWA scale; Ativan as needed for withdrawal 3. COPD: Stable; restart Spiriva.  4. DVT prophylaxis: Lovenox 5. GI prophylaxis: None The patient is a full code. Time spent on admission orders and patient care approximately 45 minutes  Harrie Foreman, MD 01/22/2016, 5:10 AM

## 2016-01-22 NOTE — ED Provider Notes (Signed)
[facility] Emergency Department Provider Note   ____________________________________________  Time seen: Approximately 4:12 AM  I have reviewed the triage vital signs and the nursing notes.   HISTORY  Chief Complaint Alcohol Problem    HPI Thomas [name] is a 65 y.o. male presents to the ED from home via EMS with a chief complaint of requesting alcohol detox. Patient has a history of alcoholism and DTs; states he has been on a binge for the past 10-14 days. On average drinks 3-4 40 ounce beers daily, one to 2 bottles of liquor and one to 2 bottles of wine daily. Denies SI/HI/AH/VH. Voices no medical complaints.   Past Medical History  Diagnosis Date  . Hypertension   . 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 Enlarged prostate   . Tongue cancer ([place])   . Renal disorder   . Lung infection     no lung cancer  . Radiation     to neck  . Status post chemotherapy   . Skin cancer     Patient Active Problem List   Diagnosis Date Noted  . Dehydration 08/17/2015  . Seizure ([place]) 12/12/2014  . Alcohol abuse 12/12/2014  . Hyponatremia 12/12/2014  . Essential hypertension 12/12/2014  . COPD (chronic obstructive pulmonary disease) ([place]) 12/12/2014    Past Surgical History  Procedure Laterality Date  . Cancerous lymph nodes removed from neck    . Knee surgery Right   . Skin biopsy      Current Outpatient Rx  Name  Route  Sig  Dispense  Refill  . cephALEXin (KEFLEX) 500 MG capsule   Oral   Take 1 capsule (500 mg total) by mouth 4 (four) times daily. Patient not taking: Reported on 01/22/2016   12 capsule   0   . folic acid (FOLVITE) 1 MG tablet   Oral   Take 1 tablet (1 mg total) by mouth daily. Patient not taking: Reported on 01/22/2016   30 tablet   3   . Multiple Vitamin (MULTIVITAMIN WITH MINERALS) TABS tablet   Oral   Take 1 tablet by mouth daily. Patient not taking: Reported on 01/22/2016   30 tablet   0     Allergies Review of  patient's allergies indicates no known allergies.  Family History  Problem Relation Age of Onset  . Hypertension Mother     Social History Social History  Substance Use Topics  . Smoking status: Heavy Tobacco Smoker    Types: Cigarettes  . Smokeless tobacco: None  . Alcohol Use: Yes     Comment: 40oz x4 daily    Review of Systems  Constitutional: No fever/chills. Eyes: No visual changes. ENT: No sore throat. Cardiovascular: Denies chest pain. Respiratory: Denies shortness of breath. Gastrointestinal: No abdominal pain.  No nausea, no vomiting.  No diarrhea.  No constipation. Genitourinary: Negative for dysuria. Musculoskeletal: Negative for back pain. Skin: Negative for rash. Neurological: Negative for headaches, focal weakness or numbness. Psychiatric:Negative for depression, SI.  10-point ROS otherwise negative.  ____________________________________________   PHYSICAL EXAM:  VITAL SIGNS: ED Triage Vitals  Enc Vitals Group     BP 01/22/16 0346 166/89 mmHg     Pulse Rate 01/22/16 0346 84     Resp 01/22/16 0346 20     Temp --      Temp src --      SpO2 01/22/16 0346 98 %     Weight 01/22/16 0346 140 lb (63.504 kg)  Height 01/22/16 0346 5\' 7"  (1.702 m)     Head Cir --      Peak Flow --      Pain Score --      Pain Loc --      Pain Edu? --      Excl. in [place]? --     Constitutional: Alert and oriented. Dishelved appearing and in no acute distress. Eyes: Conjunctivae are normal. PERRL. EOMI. Head: Atraumatic. Nose: No congestion/rhinnorhea. Mouth/Throat: Mucous membranes are moist.  Oropharynx non-erythematous. Neck: No stridor.   Cardiovascular: Normal rate, regular rhythm. Grossly normal heart sounds.  Good peripheral circulation. Respiratory: Normal respiratory effort.  No retractions. Lungs CTAB. Gastrointestinal: Soft and nontender. No distention. No abdominal bruits. No CVA tenderness. Musculoskeletal: No lower extremity tenderness nor edema.  No  joint effusions. Neurologic:  No asterixis. Normal speech and language. No gross focal neurologic deficits are appreciated.  Skin:  Skin is warm, dry and intact. No rash noted. Psychiatric: Mood and affect are normal. Speech and behavior are normal.  ____________________________________________   LABS (all labs ordered are listed, but only abnormal results are displayed)  Labs Reviewed  COMPREHENSIVE METABOLIC PANEL - Abnormal; Notable for the following:    Sodium 115 (*)    Chloride 75 (*)    AST 91 (*)    All other components within normal limits  ETHANOL - Abnormal; Notable for the following:    Alcohol, Ethyl (B) 174 (*)    All other components within normal limits  CBC - Abnormal; Notable for the following:    RBC 4.22 (*)    HCT 37.8 (*)    RDW 17.6 (*)    All other components within normal limits  ACETAMINOPHEN LEVEL - Abnormal; Notable for the following:    Acetaminophen (Tylenol), Serum <10 (*)    All other components within normal limits  SALICYLATE LEVEL  URINE DRUG SCREEN, QUALITATIVE ([facility] ONLY)   ____________________________________________  EKG  None ____________________________________________  RADIOLOGY  None ____________________________________________   PROCEDURES  Procedure(s) performed: None  Critical Care performed: No  ____________________________________________   INITIAL IMPRESSION / ASSESSMENT AND PLAN / ED COURSE  Pertinent labs & imaging results that were available during my care of the patient were reviewed by me and considered in my medical decision making (see chart for details).  65 year old alcoholic who presents to the ED for detox. Laboratory results reveal severe hyponatremia. Will place on CIWA, IV fluid bolus, banana bag and discuss with hospitalist to evaluate patient in the emergency department for admission. ____________________________________________   FINAL CLINICAL IMPRESSION(S) / ED DIAGNOSES  Final  diagnoses:  Alcohol abuse  Hyponatremia      NEW MEDICATIONS STARTED DURING THIS VISIT:  New Prescriptions   No medications on file     Note:  This document was prepared using Dragon voice recognition software and may include unintentional dictation errors.    Paulette Blanch, MD 01/22/16 [phone]

## 2016-01-22 NOTE — Progress Notes (Signed)
   01/22/16 1210  Vitals  Temp 98.6 F (37 C)  Temp Source Oral  BP (!) 172/88 mmHg  BP Location Right Arm  BP Method Automatic  Pulse Rate 90  Resp 20  Oxygen Therapy  SpO2 96 %  O2 Device Room Air    Dr. Tressia Miners made aware of pt's current BP.  New orders received.  Clarise Cruz, RN

## 2016-01-22 NOTE — ED Notes (Signed)
Pt is an alcoholic who drinks 3-4 40 oz beers a day, 1-2 bottles of liquor and 1-2 bottles of wine per day.  Pt is non-compliant with medication.  Pt states his last drink was 30 minutes prior to EMS arrival.  Pt states that he wants help to stop drinking.

## 2016-01-23 ENCOUNTER — Inpatient Hospital Stay: Payer: Medicare Other

## 2016-01-23 LAB — BASIC METABOLIC PANEL
ANION GAP: 8 (ref 5–15)
BUN: 6 mg/dL (ref 6–20)
CHLORIDE: 90 mmol/L — AB (ref 101–111)
CO2: 27 mmol/L (ref 22–32)
Calcium: 8.3 mg/dL — ABNORMAL LOW (ref 8.9–10.3)
Creatinine, Ser: 1.08 mg/dL (ref 0.61–1.24)
GFR calc non Af Amer: >60 mL/min (ref >60)
Glucose, Bld: 102 mg/dL — ABNORMAL HIGH (ref 65–99)
Potassium: 3.5 mmol/L (ref 3.5–5.1)
Sodium: 125 mmol/L — ABNORMAL LOW (ref 135–145)

## 2016-01-23 LAB — SODIUM
SODIUM: 126 mmol/L — AB (ref 135–145)
Sodium: 125 mmol/L — ABNORMAL LOW (ref 135–145)

## 2016-01-23 MED ORDER — NICOTINE 21 MG/24HR TD PT24
21.0000 mg | MEDICATED_PATCH | Freq: Every day | TRANSDERMAL | Status: DC
  Administered 2016-01-23 – 2016-01-26 (×4): 21 mg via TRANSDERMAL
  Filled 2016-01-23 (×4): qty 1

## 2016-01-23 MED ORDER — MIRTAZAPINE 15 MG PO TABS
15.0000 mg | ORAL_TABLET | Freq: Every day | ORAL | Status: DC
  Administered 2016-01-23 – 2016-01-24 (×2): 15 mg via ORAL
  Filled 2016-01-23 (×2): qty 1

## 2016-01-23 MED ORDER — TRIAMCINOLONE ACETONIDE 0.1 % EX CREA
TOPICAL_CREAM | Freq: Two times a day (BID) | CUTANEOUS | Status: DC
  Administered 2016-01-23 – 2016-01-26 (×6): via TOPICAL
  Filled 2016-01-23: qty 15

## 2016-01-23 MED ORDER — METOPROLOL TARTRATE 50 MG PO TABS
50.0000 mg | ORAL_TABLET | Freq: Two times a day (BID) | ORAL | Status: DC
  Administered 2016-01-23 – 2016-01-25 (×6): 50 mg via ORAL
  Administered 2016-01-26 (×2): 25 mg via ORAL
  Filled 2016-01-23 (×8): qty 1

## 2016-01-23 NOTE — Progress Notes (Addendum)
[place] [facility] [place] NAME: Thomas [name]    MR#:  [id number]  DATE OF BIRTH:  02/12/1951  SUBJECTIVE:  CHIEF COMPLAINT:   Chief Complaint  Patient presents with  . Alcohol Problem   - Several complaints today. Complaints of insomnia, poor appetite and weight loss. -Itchy skin on legs. Sodium at 125. Alert and oriented.  REVIEW OF SYSTEMS:  Review of Systems  Constitutional: Positive for malaise/fatigue. Negative for fever and chills.  HENT: Negative for ear discharge, ear pain and nosebleeds.   Eyes: Negative for blurred vision and double vision.  Respiratory: Negative for cough, shortness of breath and wheezing.   Cardiovascular: Negative for chest pain, palpitations and leg swelling.  Gastrointestinal: Negative for nausea, vomiting, abdominal pain, diarrhea and constipation.  Genitourinary: Negative for dysuria and urgency.  Musculoskeletal: Positive for myalgias. Negative for back pain and neck pain.  Skin: Positive for itching and rash.  Neurological: Negative for dizziness, sensory change, speech change, focal weakness, seizures and headaches.  Psychiatric/Behavioral: Negative for depression. The patient has insomnia.     DRUG ALLERGIES:  No Known Allergies  VITALS:  Blood pressure 189/88, pulse 101, temperature 98.6 F (37 C), temperature source Oral, resp. rate 18, height 5\' 7"  (1.702 m), weight 58.514 kg (129 lb), SpO2 98 %.  PHYSICAL EXAMINATION:  Physical Exam  GENERAL:  65 y.o.-year-old disshelved patient lying in the bed with no acute distress.  EYES: Pupils equal, round, reactive to light and accommodation. No scleral icterus. Extraocular muscles intact.  HEENT: Head atraumatic, normocephalic. Oropharynx and nasopharynx clear.  NECK:  Supple, no jugular venous distention. No thyroid enlargement, no tenderness.  LUNGS: Normal breath sounds bilaterally, no wheezing, rales,rhonchi or crepitation. No use of  accessory muscles of respiration.  CARDIOVASCULAR: S1, S2 normal. No murmurs, rubs, or gallops.  ABDOMEN: Soft, nontender, nondistended. Bowel sounds present. No organomegaly or mass.  EXTREMITIES: No pedal edema, cyanosis, or clubbing.  NEUROLOGIC: Cranial nerves II through XII are intact. Muscle strength 5/5 in all extremities. Sensation intact. Gait not checked.  PSYCHIATRIC: The patient is alert and oriented x 3.  SKIN: Dry skin noted on both legs. No rash noted. Chronic hyperpigmentation from eczema noted    LABORATORY PANEL:   CBC  Recent Labs Lab 01/22/16 0400  WBC 5.2  HGB 13.0  HCT 37.8*  PLT 179   ------------------------------------------------------------------------------------------------------------------  Chemistries   Recent Labs Lab 01/22/16 0400  01/23/16 0324 01/23/16 0924  NA 115*  < > 125* 125*  K 4.3  --  3.5  --   CL 75*  --  90*  --   CO2 27  --  27  --   GLUCOSE 93  --  102*  --   BUN 6  --  6  --   CREATININE 1.12  --  1.08  --   CALCIUM 9.5  --  8.3*  --   AST 91*  --   --   --   ALT 60  --   --   --   ALKPHOS 106  --   --   --   BILITOT 0.6  --   --   --   < > = values in this interval not displayed. ------------------------------------------------------------------------------------------------------------------  Cardiac Enzymes No results for input(s): TROPONINI in the last 168 hours. ------------------------------------------------------------------------------------------------------------------  RADIOLOGY:  No results found.  EKG:   Orders placed or performed during the hospital encounter of 12/11/14  . EKG (  if new onset seizures)  . EKG (if new onset seizures)  . EKG 12-Lead  . EKG 12-Lead  . EKG 12-Lead  . EKG 12-Lead  . EKG    ASSESSMENT AND PLAN:   64y/o M with PMH of alcohol use, HTN presents to the hospital secondary to secondary to weakness and noted to have hyponatremia.  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 Now at 125. -stable mental status for now  #2 Hypothyroidism- TSH elevated, Low T4 - started low dose synthroid  #3 Alcohol abuse- CIWA protocol Improving  #4 Insomnia- Discontinue trazodone and add Remeron  #5 hypertension-started Norvasc and metoprolol. 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 Need to follow-up with [facility] after discharge  #7 DVT prophylaxis-Lovenox    All the records are reviewed and case discussed with Care Management/Social Workerr. Management plans discussed with the patient, family and they are in agreement.  CODE STATUS: Full Code  TOTAL TIME TAKING CARE OF THIS PATIENT: 37 minutes.   POSSIBLE D/C IN 1-2 DAYS, DEPENDING ON CLINICAL CONDITION.   [name] M.D on 01/23/2016 at 12:13 PM  Between 7am to 6pm - Pager - [phone]  After 6pm go to [email or web address] - password [email or web address] [facility]  [facility]  Office  [phone]  CC: Primary care physician; [place]

## 2016-01-24 ENCOUNTER — Inpatient Hospital Stay: Payer: Medicare Other

## 2016-01-24 LAB — BASIC METABOLIC PANEL
ANION GAP: 8 (ref 5–15)
BUN: 6 mg/dL (ref 6–20)
CALCIUM: 8.4 mg/dL — AB (ref 8.9–10.3)
CHLORIDE: 90 mmol/L — AB (ref 101–111)
CO2: 26 mmol/L (ref 22–32)
CREATININE: 1.18 mg/dL (ref 0.61–1.24)
GFR calc non Af Amer: >60 mL/min (ref >60)
Glucose, Bld: 120 mg/dL — ABNORMAL HIGH (ref 65–99)
Potassium: 3.6 mmol/L (ref 3.5–5.1)
SODIUM: 124 mmol/L — AB (ref 135–145)

## 2016-01-24 LAB — URINALYSIS COMPLETE WITH MICROSCOPIC (ARMC ONLY)
BILIRUBIN URINE: NEGATIVE
Bacteria, UA: NONE SEEN
GLUCOSE, UA: NEGATIVE mg/dL
Hgb urine dipstick: NEGATIVE
KETONES UR: NEGATIVE mg/dL
Leukocytes, UA: NEGATIVE
Nitrite: NEGATIVE
Protein, ur: 30 mg/dL — AB
Specific Gravity, Urine: 1.013 (ref 1.005–1.030)
pH: 7 (ref 5.0–8.0)

## 2016-01-24 LAB — SODIUM, URINE, RANDOM: Sodium, Ur: 87 mmol/L

## 2016-01-24 LAB — OSMOLALITY: Osmolality: 260 mOsm/kg — ABNORMAL LOW (ref 275–295)

## 2016-01-24 LAB — SODIUM
SODIUM: 126 mmol/L — AB (ref 135–145)
SODIUM: 124 mmol/L — AB (ref 135–145)

## 2016-01-24 LAB — AMMONIA: Ammonia: 12 umol/L (ref 9–35)

## 2016-01-24 LAB — OSMOLALITY, URINE: Osmolality, Ur: 388 mOsm/kg (ref 300–900)

## 2016-01-24 MED ORDER — LEVOFLOXACIN IN D5W 750 MG/150ML IV SOLN
750.0000 mg | INTRAVENOUS | Status: DC
  Administered 2016-01-24: 16:00:00 750 mg via INTRAVENOUS
  Filled 2016-01-24 (×2): qty 150

## 2016-01-24 NOTE — Progress Notes (Signed)
Pharmacy Antibiotic Note  Thomas [name] is a 65 y.o. male admitted on 01/22/2016 with pneumonia.  Pharmacy has been consulted for levofloxacin dosing.  Plan: Levofloxacin 750 mg IV q24h.  Height: 5\' 7"  (170.2 cm) Weight: 129 lb 3.2 oz (58.605 kg) IBW/kg (Calculated) : 66.1  Temp (24hrs), Avg:99.7 F (37.6 C), Min:97.6 F (36.4 C), Max:101.4 F (38.6 C)   Recent Labs Lab 01/22/16 0400 01/23/16 0324 01/24/16 0519  WBC 5.2  --   --   CREATININE 1.12 1.08 1.18    Estimated Creatinine Clearance: 51.7 mL/min (by C-G formula based on Cr of 1.18).    No Known Allergies  Antimicrobials this admission: Levofloxacin 6/19 >>    Dose adjustments this admission:   Microbiology results: 6/19 BCx: sent   Thank you for allowing pharmacy to be a part of this patient's care.  Rocky Morel 01/24/2016 4:04 PM

## 2016-01-24 NOTE — Care Management Important Message (Signed)
Important Message  Patient Details  Name: Thomas [name] MRN: [id number] Date of Birth: Apr 22, 1951   Medicare Important Message Given:  Yes    Shelbie Ammons, RN 01/24/2016, 11:12 AM

## 2016-01-24 NOTE — Progress Notes (Signed)
[place] [facility] [place] NAME: Thomas [name]    MR#:  [id number]  DATE OF BIRTH:  [birth year]  SUBJECTIVE:  CHIEF COMPLAINT:   Chief Complaint  Patient presents with  . Alcohol Problem   - more sleepy today, received ativan now - But also started on remeron last night- per RN more alert at breakfast today - fevers last night  REVIEW OF SYSTEMS:  Review of Systems  Unable to perform ROS: mental acuity    DRUG ALLERGIES:  No Known Allergies  VITALS:  Blood pressure 132/71, pulse 96, temperature 100.2 F (37.9 C), temperature source Oral, resp. rate 20, height 5\' 7"  (1.702 m), weight 58.605 kg (129 lb 3.2 oz), SpO2 91 %.  PHYSICAL EXAMINATION:  Physical Exam  GENERAL:  65 y.o.-year-old disshelved patient lying in the bed, very sleepy.  EYES: Pupils equal, round, reactive to light and accommodation. No scleral icterus. Extraocular muscles intact.  HEENT: Head atraumatic, normocephalic. Oropharynx and nasopharynx clear.  NECK:  Supple, no jugular venous distention. No thyroid enlargement, no tenderness.  LUNGS: Normal breath sounds bilaterally, no wheezing, rales,rhonchi or crepitation. No use of accessory muscles of respiration. Decreased bibasilar breath sounds CARDIOVASCULAR: S1, S2 normal. No murmurs, rubs, or gallops.  ABDOMEN: Soft, nontender, nondistended. Bowel sounds present. No organomegaly or mass.  EXTREMITIES: No pedal edema, cyanosis, or clubbing.  NEUROLOGIC: Cranial nerves II through XII are intact. Muscle strength 5/5 in all extremities. Sensation intact. Gait not checked.  PSYCHIATRIC: The patient is alert and oriented x 3.  SKIN: Dry skin noted on both legs. No rash noted. Chronic hyperpigmentation from eczema noted    LABORATORY PANEL:   CBC  Recent Labs Lab 01/22/16 0400  WBC 5.2  HGB 13.0  HCT 37.8*  PLT 179    ------------------------------------------------------------------------------------------------------------------  Chemistries   Recent Labs Lab 01/22/16 0400  01/24/16 0519 01/24/16 1003  NA 115*  < > 124* 126*  K 4.3  < > 3.6  --   CL 75*  < > 90*  --   CO2 27  < > 26  --   GLUCOSE 93  < > 120*  --   BUN 6  < > 6  --   CREATININE 1.12  < > 1.18  --   CALCIUM 9.5  < > 8.4*  --   AST 91*  --   --   --   ALT 60  --   --   --   ALKPHOS 106  --   --   --   BILITOT 0.6  --   --   --   < > = values in this interval not displayed. ------------------------------------------------------------------------------------------------------------------  Cardiac Enzymes No results for input(s): TROPONINI in the last 168 hours. ------------------------------------------------------------------------------------------------------------------  RADIOLOGY:  Dg Chest 2 View  01/24/2016  CLINICAL DATA:  Fever, history of COPD, left node malignancy and nasopharygeal carcinoma. EXAM: CHEST  2 VIEW COMPARISON:  Chest x-ray of August 20, 2015 FINDINGS: The lungs remain hyperinflated. There is hazy increased density in the right mid lung. There is subtle increased density in the retrocardiac region. The heart and pulmonary vascularity are normal. The mediastinum is normal in width. There is no pleural effusion or pneumothorax. The observed bony thorax exhibits no acute abnormality. A metallic pellet lies over the lateral aspect of the left third rib and is stable. There is underlying rib deformity that is also stable. IMPRESSION: Subtle increased density in the right mid  lung and at the left lung base may reflect early pneumonia. There is underlying COPD. Followup PA and lateral chest X-ray is recommended in 3-4 weeks following trial of antibiotic therapy to ensure resolution and exclude underlying malignancy. Electronically Signed   By: David  Martinique M.D.   On: 01/24/2016 07:50   Dg Pelvis 1-2  Views  01/23/2016  CLINICAL DATA:  Patient with left posterior pelvic pain. No known injury. Initial encounter. EXAM: PELVIS - 1-2 VIEW COMPARISON:  Lumbar spine radiograph 05/08/2012. FINDINGS: Lumbar spine degenerative changes. SI joints are unremarkable. Mild bilateral hip joint degenerative changes. Vascular calcifications. No evidence for acute fracture. IMPRESSION: No acute osseous abnormality. Mild bilateral hip joint and lumbar spine degenerative changes. Electronically Signed   By: Lovey Newcomer M.D.   On: 01/23/2016 14:15    EKG:   Orders placed or performed during the hospital encounter of 12/11/14  . EKG (if new onset seizures)  . EKG (if new onset seizures)  . EKG 12-Lead  . EKG 12-Lead  . EKG 12-Lead  . EKG 12-Lead  . EKG    ASSESSMENT AND PLAN:   65y/o M with PMH of alcohol use, HTN presents to the hospital secondary to secondary to weakness and noted to have hyponatremia.  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 Appreciate nephrology consult. -Discontinued IV fluids and started on fluid restriction today  #2 Hypothyroidism- TSH elevated, Low T4 - started on synthroid  #3 Alcohol abuse- CIWA protocol Improving  #4 fevers-could be likely from pneumonia. Chest x-ray with bibasilar infiltrates. -Blood cultures ordered. Urine analysis is pending. Started on Levaquin for now  #5 hypertension-started Norvasc and metoprolol. 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 Need to follow[facility]up with [facility] after discharge  #7 DVT prophylaxis-Lovenox  #8 insomnia-started on Remeron. Continue to monitor as patient is sleepy today if its  secondary to Ativan, continue Remeron. If it is secondary to Remeron, decreased the dose   Physical therapy consult when more stable.    All the records are reviewed and case discussed with Care Management/Social Workerr. Management plans discussed with the patient, family and they are in agreement.  CODE STATUS: Full Code  TOTAL TIME TAKING CARE OF THIS PATIENT: 37 minutes.   POSSIBLE D/C IN 1-2 DAYS, DEPENDING ON CLINICAL CONDITION.   [name] M.D on 01/24/2016 at 2:01 PM  Between 7am to 6pm - Pager - [phone]  After 6pm go to [email or web address] - password [email or web address] [facility]  [facility]  Office  [phone]  CC: Primary care physician; [place]

## 2016-01-24 NOTE — Consult Note (Signed)
[facility]  CONSULT NOTE    Date: 01/24/2016                  Patient Name:  Thomas [name]  MRN: [id number]  DOB: 01-25-1951  Age / Sex: 65 y.o., male         PCP: [place]                 Service Requesting Consult: Dr. Tressia Miners                 Reason for Consult: Hyponatremia            History of Present Illness: Thomas [name] is a 65 y.o. white male with EtOH abuse, nasal and tongue cancer status post resection, BPH, hypertension, who was admitted to [facility] on 01/22/2016 for Alcohol abuse [F10.10] Hyponatremia [E87.1]   Patient is alert and oriented however has poor insight. Sodium of 124. Placed on NS at 43mL/hr. No urine studies on examination. When last evaluated by nephrology for hyponatremia, determined that this was due to chronic alcohol abuse causing a beer potomania. 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  He is euvolemic without edema.    Medications: Outpatient medications: Prescriptions prior to admission  Medication Sig Dispense Refill Last Dose  . cephALEXin (KEFLEX) 500 MG capsule Take 1 capsule (500 mg total) by mouth 4 (four) times daily. (Patient not taking: Reported on 01/22/2016) 12 capsule 0 Not Taking at Unknown time  . folic acid (FOLVITE) 1 MG tablet Take 1 tablet (1 mg total) by mouth daily. (Patient not taking: Reported on 01/22/2016) 30 tablet 3 Not Taking at Unknown time  . Multiple Vitamin (MULTIVITAMIN WITH MINERALS) TABS tablet Take 1 tablet by mouth daily. (Patient not taking: Reported on 01/22/2016) 30 tablet 0 Not Taking at Unknown time    Current medications: Current Facility-Administered Medications  Medication Dose Route Frequency Provider Last Rate Last Dose  . 0.9 %  sodium chloride infusion   Intravenous Continuous [name], MD 75 mL/hr at 01/24/16 0016    . acetaminophen (TYLENOL) tablet 650 mg  650 mg Oral Q6H PRN Harrie Foreman, MD   650 mg at 01/24/16 0433   Or  .  acetaminophen (TYLENOL) suppository 650 mg  650 mg Rectal Q6H PRN Harrie Foreman, MD      . amLODipine (NORVASC) tablet 5 mg  5 mg Oral Daily [name], MD   5 mg at 01/24/16 0912  . docusate sodium (COLACE) capsule 100 mg  100 mg Oral BID Harrie Foreman, MD   100 mg at 01/24/16 0913  . enoxaparin (LOVENOX) injection 40 mg  40 mg Subcutaneous Q24H Harrie Foreman, MD   40 mg at 01/23/16 2157  . feeding supplement (ENSURE ENLIVE) (ENSURE ENLIVE) liquid 237 mL  237 mL Oral BID BM [name], MD   237 mL at 01/24/16 0930  . folic acid (FOLVITE) tablet 1 mg  1 mg Oral Daily Harrie Foreman, MD   1 mg at 01/24/16 0913  . hydrALAZINE (APRESOLINE) injection 10 mg  10 mg Intravenous Q6H PRN [name], MD      . levothyroxine (SYNTHROID, LEVOTHROID) tablet 50 mcg  50 mcg Oral QAC breakfast [name], MD   50 mcg at 01/24/16 0913  . LORazepam (ATIVAN) injection 0-4 mg  0-4 mg Intravenous Q12H Harrie Foreman, MD   1 mg at 01/24/16 0914  . LORazepam (ATIVAN) tablet  1 mg  1 mg Oral Q6H PRN Harrie Foreman, MD   1 mg at 01/22/16 [id number]   Or  . LORazepam (ATIVAN) injection 1 mg  1 mg Intravenous Q6H PRN Harrie Foreman, MD   1 mg at 01/23/16 1022  . metoprolol (LOPRESSOR) tablet 50 mg  50 mg Oral BID [name], MD   50 mg at 01/24/16 0913  . mirtazapine (REMERON) tablet 15 mg  15 mg Oral QHS [name], MD   15 mg at 01/23/16 2157  . morphine 2 MG/ML injection 2 mg  2 mg Intravenous Q4H PRN Harrie Foreman, MD      . multivitamin with minerals tablet 1 tablet  1 tablet Oral Daily Harrie Foreman, MD   1 tablet at 01/24/16 0912  . nicotine (NICODERM CQ - dosed in mg/24 hours) patch 21 mg  21 mg Transdermal Daily [name], MD   21 mg at 01/24/16 0915  . ondansetron (ZOFRAN) tablet 4 mg  4 mg Oral Q6H PRN Harrie Foreman, MD       Or  . ondansetron [facility]) injection 4 mg  4 mg Intravenous Q6H PRN Harrie Foreman, MD   4 mg at 01/23/16  0758  . thiamine (VITAMIN B-1) tablet 100 mg  100 mg Oral Daily Harrie Foreman, MD   100 mg at 01/24/16 0913   Or  . thiamine (B-1) injection 100 mg  100 mg Intravenous Daily Harrie Foreman, MD      . tiotropium [facility]) inhalation capsule 18 mcg  18 mcg Inhalation Daily Harrie Foreman, MD   18 mcg at 01/24/16 [phone]  . triamcinolone cream (KENALOG) 0.1 %   Topical BID [name], MD          Allergies: No Known Allergies    Past Medical History: Past Medical History  Diagnosis Date  . Hypertension   . Cancer of nasal cavities ([place])   . Enlarged prostate   . Tongue cancer ([place])   . Renal disorder   . Lung infection     no lung cancer  . Radiation     to neck  . Status post chemotherapy   . Skin cancer      Past Surgical History: Past Surgical History  Procedure Laterality Date  . Cancerous lymph nodes removed from neck    . Knee surgery Right   . Skin biopsy       Family History: Family History  Problem Relation Age of Onset  . Hypertension Mother      Social History: Social History   Social History  . Marital Status: Divorced    Spouse Name: N/A  . Number of Children: N/A  . Years of Education: N/A   Occupational History  . Not on file.   Social History Main Topics  . Smoking status: Heavy Tobacco Smoker    Types: Cigarettes  . Smokeless tobacco: Not on file  . Alcohol Use: Yes     Comment: 40oz x4 daily  . Drug Use: No  . Sexual Activity: Not on file   Other Topics Concern  . Not on file   Social History Narrative     Review of Systems: Review of Systems  Constitutional: Positive for fever, chills, weight loss, malaise/fatigue and diaphoresis.  HENT: Negative.  Negative for congestion, ear discharge, ear pain, hearing loss, nosebleeds, sore throat and tinnitus.   Eyes: Negative.  Negative for blurred vision, double vision, photophobia, pain, discharge and  redness.  Respiratory: Negative.  Negative for cough, hemoptysis,  sputum production, shortness of breath, wheezing and stridor.   Cardiovascular: Negative.  Negative for chest pain, palpitations, orthopnea, claudication, leg swelling and PND.  Gastrointestinal: Negative.  Negative for heartburn, nausea, vomiting, abdominal pain, diarrhea, constipation, blood in stool and melena.  Genitourinary: Negative.  Negative for dysuria, urgency, frequency, hematuria and flank pain.  Musculoskeletal: Negative.  Negative for myalgias, back pain, joint pain, falls and neck pain.  Skin: Negative.  Negative for itching and rash.  Neurological: Positive for weakness. Negative for dizziness, tingling, tremors, sensory change, speech change, focal weakness, seizures, loss of consciousness and headaches.  Endo/Heme/Allergies: Negative for environmental allergies and polydipsia. Does not bruise/bleed easily.  Psychiatric/Behavioral: Positive for depression and substance abuse. Negative for suicidal ideas, hallucinations and memory loss. The patient is nervous/anxious. The patient does not have insomnia.     Vital Signs: Blood pressure 132/71, pulse 96, temperature 100.2 F (37.9 C), temperature source Oral, resp. rate 20, height 5\' 7"  (1.702 m), weight 58.605 kg (129 lb 3.2 oz), SpO2 91 %.  Weight trends: Filed Weights   01/22/16 0617 01/23/16 0500 01/24/16 0500  Weight: 57.153 kg (126 lb) 58.514 kg (129 lb) 58.605 kg (129 lb 3.2 oz)    Physical Exam: General: NAD, laying in bed  Head: Right nasal resection  Eyes: Anicteric, PERRL  Neck: Supple, trachea midline  Lungs:  Clear to auscultation  Heart: Regular rate and rhythm  Abdomen:  Soft, nontender,   Extremities:  no peripheral edema.  Neurologic: Nonfocal, moving all four extremities  Skin: No lesions        Lab results: Basic Metabolic Panel:  Recent Labs Lab 01/22/16 0400  01/23/16 0324 01/23/16 0924 01/23/16 2126 01/24/16 0519  NA 115*  < > 125* 125* 126* 124*  K 4.3  --  3.5  --   --  3.6  CL 75*   --  90*  --   --  90*  CO2 27  --  27  --   --  26  GLUCOSE 93  --  102*  --   --  120*  BUN 6  --  6  --   --  6  CREATININE 1.12  --  1.08  --   --  1.18  CALCIUM 9.5  --  8.3*  --   --  8.4*  < > = values in this interval not displayed.  Liver Function Tests:  Recent Labs Lab 01/22/16 0400  AST 91*  ALT 60  ALKPHOS 106  BILITOT 0.6  PROT 8.0  ALBUMIN 4.8   No results for input(s): LIPASE, AMYLASE in the last 168 hours. No results for input(s): AMMONIA in the last 168 hours.  CBC:  Recent Labs Lab 01/22/16 0400  WBC 5.2  HGB 13.0  HCT 37.8*  MCV 89.7  PLT 179    Cardiac Enzymes: No results for input(s): CKTOTAL, CKMB, CKMBINDEX, TROPONINI in the last 168 hours.  BNP: Invalid input(s): POCBNP  CBG: No results for input(s): GLUCAP in the last 168 hours.  Microbiology: Results for orders placed or performed during the hospital encounter of 08/17/15  CULTURE, BLOOD (ROUTINE X 2) w Reflex to PCR ID Panel     Status: None   Collection Time: 08/20/15  8:36 AM  Result Value Ref Range Status   Specimen Description BLOOD RIGHT ARM  Final   Special Requests BOTTLES DRAWN AEROBIC AND ANAEROBIC  1CC  Final   Culture  NO GROWTH 5 DAYS  Final   Report Status 08/25/2015 FINAL  Final  CULTURE, BLOOD (ROUTINE X 2) w Reflex to PCR ID Panel     Status: None   Collection Time: 08/20/15  8:48 AM  Result Value Ref Range Status   Specimen Description BLOOD LEFT ARM  Final   Special Requests BOTTLES DRAWN AEROBIC AND ANAEROBIC  [place]  Final   Culture NO GROWTH 5 DAYS  Final   Report Status 08/25/2015 FINAL  Final  Urine culture     Status: None   Collection Time: 08/20/15 12:50 PM  Result Value Ref Range Status   Specimen Description URINE, RANDOM  Final   Special Requests NONE  Final   Culture >=100,000 COLONIES/mL ENTEROBACTER CLOACAE  Final   Report Status 08/22/2015 FINAL  Final   Organism ID, Bacteria ENTEROBACTER CLOACAE  Final      Susceptibility   Enterobacter  cloacae - MIC*    CEFTAZIDIME Value in next row Sensitive      SENSITIVE<=1    CEFTRIAXONE Value in next row Sensitive      SENSITIVE<=1    CIPROFLOXACIN Value in next row Sensitive      SENSITIVE<=0.25    GENTAMICIN Value in next row Sensitive      SENSITIVE<=1    IMIPENEM Value in next row Sensitive      SENSITIVE<=0.25    NITROFURANTOIN Value in next row Sensitive      SENSITIVE<=16    TRIMETH/SULFA Value in next row Sensitive      SENSITIVE<=20    PIP/TAZO Value in next row Sensitive      SENSITIVE<=4    * >=100,000 COLONIES/mL ENTEROBACTER CLOACAE    Coagulation Studies: No results for input(s): LABPROT, INR in the last 72 hours.  Urinalysis: No results for input(s): COLORURINE, LABSPEC, PHURINE, GLUCOSEU, HGBUR, BILIRUBINUR, KETONESUR, PROTEINUR, UROBILINOGEN, NITRITE, LEUKOCYTESUR in the last 72 hours.  Invalid input(s): APPERANCEUR    Imaging: Dg Chest 2 View  01/24/2016  CLINICAL DATA:  Fever, history of COPD, left node malignancy and nasopharygeal carcinoma. EXAM: CHEST  2 VIEW COMPARISON:  Chest x-ray of August 20, 2015 FINDINGS: The lungs remain hyperinflated. There is hazy increased density in the right mid lung. There is subtle increased density in the retrocardiac region. The heart and pulmonary vascularity are normal. The mediastinum is normal in width. There is no pleural effusion or pneumothorax. The observed bony thorax exhibits no acute abnormality. A metallic pellet lies over the lateral aspect of the left third rib and is stable. There is underlying rib deformity that is also stable. IMPRESSION: Subtle increased density in the right mid lung and at the left lung base may reflect early pneumonia. There is underlying COPD. Followup PA and lateral chest X-ray is recommended in 3-4 weeks following trial of antibiotic therapy to ensure resolution and exclude underlying malignancy. Electronically Signed   By: David  Martinique M.D.   On: 01/24/2016 07:50   Dg Pelvis 1-2  Views  01/23/2016  CLINICAL DATA:  Patient with left posterior pelvic pain. No known injury. Initial encounter. EXAM: PELVIS - 1-2 VIEW COMPARISON:  Lumbar spine radiograph 05/08/2012. FINDINGS: Lumbar spine degenerative changes. SI joints are unremarkable. Mild bilateral hip joint degenerative changes. Vascular calcifications. No evidence for acute fracture. IMPRESSION: No acute osseous abnormality. Mild bilateral hip joint and lumbar spine degenerative changes. Electronically Signed   By: Lovey Newcomer M.D.   On: 01/23/2016 14:15      Assessment & Plan: Mr. Kalin  CONLAN [name] is a 65 y.o. white male with EtOH abuse, nasal and tongue cancer status post resection, BPH, hypertension, who was admitted to [facility] on 01/22/2016 for Alcohol abuse [F10.10] Hyponatremia [E87.1]   1. Hyponatremia: history suggestive related to EtOH either from beer potomania or from underlyign SIADH. Seems chronic. Baseline ~130 - Will discontinue IV fluids - Check serum osm, urine sodium and osm - Will consider fluid restriction  2. Hypertension: with elevations.  - amlodipine and metoprolol  3. Alcohol abuse: concern for withdrawal symptoms.  - agree with thiamine and folic acid.      LOS: [place], Artist Bloom 6/19/201710:29 AM

## 2016-01-25 LAB — BASIC METABOLIC PANEL
Anion gap: 8 (ref 5–15)
BUN: 15 mg/dL (ref 6–20)
CHLORIDE: 91 mmol/L — AB (ref 101–111)
CO2: 28 mmol/L (ref 22–32)
CREATININE: 1.38 mg/dL — AB (ref 0.61–1.24)
Calcium: 8.6 mg/dL — ABNORMAL LOW (ref 8.9–10.3)
GFR calc Af Amer: >60 mL/min (ref >60)
GFR, EST NON AFRICAN AMERICAN: 52 mL/min — AB (ref >60)
GLUCOSE: 96 mg/dL (ref 65–99)
POTASSIUM: 4.2 mmol/L (ref 3.5–5.1)
Sodium: 127 mmol/L — ABNORMAL LOW (ref 135–145)

## 2016-01-25 LAB — CBC
HCT: 32.4 % — ABNORMAL LOW (ref 40.0–52.0)
HEMOGLOBIN: 11.2 g/dL — AB (ref 13.0–18.0)
MCH: 31.3 pg (ref 26.0–34.0)
MCHC: 34.7 g/dL (ref 32.0–36.0)
MCV: 90.3 fL (ref 80.0–100.0)
PLATELETS: 182 10*3/uL (ref 150–440)
RBC: 3.59 MIL/uL — AB (ref 4.40–5.90)
RDW: 17.6 % — ABNORMAL HIGH (ref 11.5–14.5)
WBC: 8.3 10*3/uL (ref 3.8–10.6)

## 2016-01-25 MED ORDER — POLYETHYLENE GLYCOL 3350 17 G PO PACK
17.0000 g | PACK | Freq: Every day | ORAL | Status: AC
  Administered 2016-01-25 – 2016-01-26 (×2): 17 g via ORAL
  Filled 2016-01-25 (×2): qty 1

## 2016-01-25 MED ORDER — METOPROLOL TARTRATE 50 MG PO TABS: 50.0000 mg | ORAL_TABLET | Freq: Two times a day (BID) | ORAL | Status: DC

## 2016-01-25 MED ORDER — LEVOFLOXACIN IN D5W 750 MG/150ML IV SOLN
750.0000 mg | INTRAVENOUS | Status: DC
  Filled 2016-01-25: qty 150

## 2016-01-25 MED ORDER — LEVOFLOXACIN 500 MG PO TABS: 500.0000 mg | ORAL_TABLET | Freq: Every day | ORAL | Status: DC

## 2016-01-25 MED ORDER — TIOTROPIUM BROMIDE MONOHYDRATE 18 MCG IN CAPS
18.0000 ug | ORAL_CAPSULE | Freq: Every day | RESPIRATORY_TRACT | Status: DC
Start: 2016-01-25 — End: 2018-02-19

## 2016-01-25 MED ORDER — MIRTAZAPINE 15 MG PO TABS
7.5000 mg | ORAL_TABLET | Freq: Every day | ORAL | Status: DC
  Administered 2016-01-25: 7.5 mg via ORAL
  Filled 2016-01-25: qty 1

## 2016-01-25 MED ORDER — MIRTAZAPINE 7.5 MG PO TABS: 7.5000 mg | ORAL_TABLET | Freq: Every day | ORAL | Status: DC

## 2016-01-25 MED ORDER — LEVOTHYROXINE SODIUM 50 MCG PO TABS: 50.0000 ug | ORAL_TABLET | Freq: Every day | ORAL | Status: DC

## 2016-01-25 MED ORDER — FLUTICASONE-SALMETEROL 250-50 MCG/DOSE IN AEPB: 1.0000 | INHALATION_SPRAY | Freq: Two times a day (BID) | RESPIRATORY_TRACT | Status: DC

## 2016-01-25 NOTE — Progress Notes (Signed)
Physical therapy recommended rehabilitation. Patient will be discharged tomorrow

## 2016-01-25 NOTE — Discharge Summary (Signed)
[facility] [place] NAME: Thomas [name]    MR#:  [id number]  DATE OF BIRTH:  12-Feb-1951  DATE OF ADMISSION:  01/22/2016 ADMITTING PHYSICIAN: Harrie Foreman, MD  DATE OF DISCHARGE: 01/25/2016  PRIMARY CARE PHYSICIAN: [facility]    ADMISSION DIAGNOSIS:  Alcohol abuse [F10.10] Hyponatremia [E87.1]  DISCHARGE DIAGNOSIS:  Active Problems:   Hyponatremia   Protein-calorie malnutrition, severe   SECONDARY DIAGNOSIS:   Past Medical History  Diagnosis Date  . Hypertension   . Cancer of nasal cavities ([place])   . Enlarged prostate   . Tongue cancer ([place])   . Renal disorder   . Lung infection     no lung cancer  . Radiation     to neck  . Status post chemotherapy   . Skin cancer     HOSPITAL COURSE:    65y/o M with PMH of alcohol use, HTN presents to the hospital secondary to secondary to weakness and noted to have hyponatremia.  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 Appreciate nephrology consult. -Continue fluid restriction  #2 Hypothyroidism- TSH elevated, Low T4 - started on synthroid  #3 Alcohol abuse- CIWA protocol Improving.   #4 Pneumonia. Chest x-ray with bibasilar infiltrates. -Blood cultures negative so far. Urine analysis with no infection. Continue Levaquin for now  #5 hypertension-on metoprolol. 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 Need to follow-up with [facility] after discharge  #7 insomnia-started on Remeron. Decreased the dose to 7.5 mg at bedtime due to increased sleepiness.  Physical therapy consult recommended rehabilitation as patient was unsteady on his feet and not safe to  go back home by himself.  DISCHARGE CONDITIONS:   Guarded  CONSULTS OBTAINED:  Treatment Team:  Lavonia Dana, MD  DRUG ALLERGIES:  No Known Allergies  DISCHARGE MEDICATIONS:   Current Discharge Medication List    START taking these medications   Details  Fluticasone-Salmeterol (ADVAIR DISKUS) 250-50 MCG/DOSE AEPB Inhale 1 puff into the lungs 2 (two) times daily. Qty: 60 each, Refills: 1    levofloxacin (LEVAQUIN) 500 MG tablet Take 1 tablet (500 mg total) by mouth daily. X 6 more days Qty: 6 tablet, Refills: 0    levothyroxine (SYNTHROID, LEVOTHROID) 50 MCG tablet Take 1 tablet (50 mcg total) by mouth daily before breakfast. Qty: 30 tablet, Refills: 2    metoprolol (LOPRESSOR) 50 MG tablet Take 1 tablet (50 mg total) by mouth 2 (two) times daily. Qty: 60 tablet, Refills: 2    mirtazapine (REMERON) 7.5 MG tablet Take 1 tablet (7.5 mg total) by mouth at bedtime. Qty: 30 tablet, Refills: 2    tiotropium (SPIRIVA) 18 MCG inhalation capsule Place 1 capsule (18 mcg total) into inhaler and inhale daily. Qty: 30 capsule, Refills: 2      CONTINUE these medications which have NOT CHANGED   Details  Multiple Vitamin (MULTIVITAMIN WITH MINERALS) TABS tablet Take 1 tablet by mouth daily. Qty: 30 tablet, Refills: 0      STOP taking these medications     cephALEXin (KEFLEX) 500 MG capsule      folic acid (FOLVITE) 1 MG tablet          DISCHARGE INSTRUCTIONS:  1. [facility] follow-up for oncology department in 2 weeks 2. PCP follow-up in 1-2 weeks   If you experience worsening of your admission symptoms, develop shortness of breath, life threatening emergency, suicidal or homicidal thoughts you must seek medical attention immediately by calling 911 or calling your MD immediately  if symptoms less severe.  You Must read complete instructions/literature along with all the possible adverse reactions/side effects for all the Medicines you take and that have been prescribed to  you. Take any new Medicines after you have completely understood and accept all the possible adverse reactions/side effects.   Please note  You were cared for by a hospitalist during your hospital stay. If you have any questions about your discharge medications or the care you received while you were in the hospital after you are discharged, you can call the unit and asked to speak with the hospitalist on call if the hospitalist that took care of you is not available. Once you are discharged, your primary care physician will handle any further medical issues. Please note that NO REFILLS for any discharge medications will be authorized once you are discharged, as it is imperative that you return to your primary care physician (or establish a relationship with a primary care physician if you do not have one) for your aftercare needs so that they can reassess your need for medications and monitor your lab values.    Today   CHIEF COMPLAINT:   Chief Complaint  Patient presents with  . Alcohol Problem    VITAL SIGNS:  Blood pressure 129/66, pulse 68, temperature 98.2 F (36.8 C), temperature source Oral, resp. rate 18, height 5\' 7"  (1.702 m), weight 56.065 kg (123 lb 9.6 oz), SpO2 96 %.  I/O:   Intake/Output Summary (Last 24 hours) at 01/25/16 1403 Last data filed at 01/25/16 1300  Gross per 24 hour  Intake    510 ml  Output    700 ml  Net   -190 ml    PHYSICAL EXAMINATION:   Physical Exam  GENERAL: 65 y.o.-year-old disshelved patient lying in the bed, very sleepy.  EYES: Pupils equal, round, reactive to light and accommodation. No scleral icterus. Extraocular muscles intact.  HEENT: Head atraumatic, normocephalic. Oropharynx and nasopharynx clear.  NECK: Supple, no jugular venous distention. No thyroid enlargement, no tenderness.  LUNGS: Normal breath sounds bilaterally, no wheezing, rales,rhonchi or crepitation. No use of accessory muscles of respiration. Decreased bibasilar  breath sounds CARDIOVASCULAR: S1, S2 normal. No murmurs, rubs, or gallops.  ABDOMEN: Soft, nontender, nondistended. Bowel sounds present. No organomegaly or mass.  EXTREMITIES: No pedal edema, cyanosis, or clubbing.  NEUROLOGIC: Cranial nerves II through XII are intact. Muscle strength 5/5 in all extremities. Sensation intact. Gait not checked.  PSYCHIATRIC: The patient is alert and oriented x 3.  SKIN: Dry skin noted on both legs. No rash noted. Chronic hyperpigmentation from eczema noted    DATA REVIEW:   CBC  Recent Labs Lab 01/25/16 0630  WBC 8.3  HGB 11.2*  HCT 32.4*  PLT 182    Chemistries   Recent Labs Lab 01/22/16 0400  01/25/16 0630  NA 115*  < > 127*  K 4.3  < > 4.2  CL 75*  < > 91*  CO2 27  < > 28  GLUCOSE 93  < > 96  BUN 6  < > 15  CREATININE 1.12  < > 1.38*  CALCIUM 9.5  < > 8.6*  AST 91*  --   --  ALT 60  --   --   ALKPHOS 106  --   --   BILITOT 0.6  --   --   < > = values in this interval not displayed.  Cardiac Enzymes No results for input(s): TROPONINI in the last 168 hours.  Microbiology Results  Results for orders placed or performed during the hospital encounter of 01/22/16  CULTURE, BLOOD (ROUTINE X 2) w Reflex to ID Panel     Status: None (Preliminary result)   Collection Time: 01/24/16  3:15 PM  Result Value Ref Range Status   Specimen Description BLOOD RIGHT A  Final   Special Requests   Final    BOTTLES DRAWN AEROBIC AND ANAEROBIC AER 10ML ANA 5ML   Culture NO GROWTH < 24 HOURS  Final   Report Status PENDING  Incomplete  CULTURE, BLOOD (ROUTINE X 2) w Reflex to ID Panel     Status: None (Preliminary result)   Collection Time: 01/24/16  3:15 PM  Result Value Ref Range Status   Specimen Description BLOOD LEFT A  Final   Special Requests   Final    BOTTLES DRAWN AEROBIC AND ANAEROBIC AER 10ML ANA 9ML   Culture NO GROWTH < 24 HOURS  Final   Report Status PENDING  Incomplete    RADIOLOGY:  Dg Chest 2 View  01/24/2016   CLINICAL DATA:  Fever, history of COPD, left node malignancy and nasopharygeal carcinoma. EXAM: CHEST  2 VIEW COMPARISON:  Chest x-ray of August 20, 2015 FINDINGS: The lungs remain hyperinflated. There is hazy increased density in the right mid lung. There is subtle increased density in the retrocardiac region. The heart and pulmonary vascularity are normal. The mediastinum is normal in width. There is no pleural effusion or pneumothorax. The observed bony thorax exhibits no acute abnormality. A metallic pellet lies over the lateral aspect of the left third rib and is stable. There is underlying rib deformity that is also stable. IMPRESSION: Subtle increased density in the right mid lung and at the left lung base may reflect early pneumonia. There is underlying COPD. Followup PA and lateral chest X-ray is recommended in 3-4 weeks following trial of antibiotic therapy to ensure resolution and exclude underlying malignancy. Electronically Signed   By: David  Martinique M.D.   On: 01/24/2016 07:50    EKG:   Orders placed or performed during the hospital encounter of 12/11/14  . EKG (if new onset seizures)  . EKG (if new onset seizures)  . EKG 12-Lead  . EKG 12-Lead  . EKG 12-Lead  . EKG 12-Lead  . EKG      Management plans discussed with the patient, family and they are in agreement.  CODE STATUS:     Code Status Orders        Start     Ordered   01/22/16 0553  Full code   Continuous     01/22/16 0552    Code Status History    Date Active Date Inactive Code Status Order ID Comments User Context   08/17/2015 10:29 PM 08/21/2015  6:00 PM Full Code [id number]  Lance Coon, MD Inpatient   12/12/2014  1:19 AM 12/18/2014  4:59 PM Full Code [id number]  Lytle Butte, MD ED      TOTAL TIME TAKING CARE OF THIS PATIENT: 37 minutes.    Jasiri Hanawalt M.D on 01/25/2016 at 2:03 PM  Between 7am to 6pm - Pager - [phone]  After 6pm go to [email or web address] - password [email or web address]  [facility]  Office  [phone]  CC: Primary care physician; [place]

## 2016-01-25 NOTE — Progress Notes (Signed)
[facility]  ROUNDING NOTE   Subjective:   Clinically improved. Na 127  Objective:  Vital signs in last 24 hours:  Temp:  [97.8 F (36.6 C)-98.3 F (36.8 C)] 97.8 F (36.6 C) (06/20 0446) Pulse Rate:  [75-95] 75 (06/20 0446) Resp:  [18] 18 (06/20 0446) BP: (91-121)/(53-75) 115/53 mmHg (06/20 0446) SpO2:  [94 %-98 %] 98 % (06/20 0446) Weight:  [56.065 kg (123 lb 9.6 oz)] 56.065 kg (123 lb 9.6 oz) (06/20 0446)  Weight change: -2.54 kg (-5 lb 9.6 oz) Filed Weights   01/23/16 0500 01/24/16 0500 01/25/16 0446  Weight: 58.514 kg (129 lb) 58.605 kg (129 lb 3.2 oz) 56.065 kg (123 lb 9.6 oz)    Intake/Output: I/O last 3 completed shifts: In: 150 [IV Piggyback:150] Out: 1150 [Urine:1150]   Intake/Output this shift:  Total I/O In: 240 [P.O.:240] Out: -   Physical Exam: General: NAD  Head: Normocephalic, atraumatic. Moist oral mucosal membranes  Eyes: Anicteric, PERRL  Neck: Supple, trachea midline  Lungs:  Clear to auscultation  Heart: Regular rate and rhythm  Abdomen:  Soft, nontender,   Extremities:  no peripheral edema.  Neurologic: Nonfocal, moving all four extremities  Skin: No lesions       Basic Metabolic Panel:  Recent Labs Lab 01/22/16 0400  01/23/16 0324  01/23/16 2126 01/24/16 0519 01/24/16 1003 01/24/16 2118 01/25/16 0630  NA 115*  < > 125*  < > 126* 124* 126* 124* 127*  K 4.3  --  3.5  --   --  3.6  --   --  4.2  CL 75*  --  90*  --   --  90*  --   --  91*  CO2 27  --  27  --   --  26  --   --  28  GLUCOSE 93  --  102*  --   --  120*  --   --  96  BUN 6  --  6  --   --  6  --   --  15  CREATININE 1.12  --  1.08  --   --  1.18  --   --  1.38*  CALCIUM 9.5  --  8.3*  --   --  8.4*  --   --  8.6*  < > = values in this interval not displayed.  Liver Function Tests:  Recent Labs Lab 01/22/16 0400  AST 91*  ALT 60  ALKPHOS 106  BILITOT 0.6  PROT 8.0  ALBUMIN 4.8   No results for input(s): LIPASE, AMYLASE in the last 168  hours.  Recent Labs Lab 01/24/16 1515  AMMONIA 12    CBC:  Recent Labs Lab 01/22/16 0400 01/25/16 0630  WBC 5.2 8.3  HGB 13.0 11.2*  HCT 37.8* 32.4*  MCV 89.7 90.3  PLT 179 182    Cardiac Enzymes: No results for input(s): CKTOTAL, CKMB, CKMBINDEX, TROPONINI in the last 168 hours.  BNP: Invalid input(s): POCBNP  CBG: No results for input(s): GLUCAP in the last 168 hours.  Microbiology: Results for orders placed or performed during the hospital encounter of 01/22/16  CULTURE, BLOOD (ROUTINE X 2) w Reflex to ID Panel     Status: None (Preliminary result)   Collection Time: 01/24/16  3:15 PM  Result Value Ref Range Status   Specimen Description BLOOD RIGHT A  Final   Special Requests   Final    BOTTLES DRAWN AEROBIC AND ANAEROBIC AER 10ML  ANA 5ML   Culture NO GROWTH < 24 HOURS  Final   Report Status PENDING  Incomplete  CULTURE, BLOOD (ROUTINE X 2) w Reflex to ID Panel     Status: None (Preliminary result)   Collection Time: 01/24/16  3:15 PM  Result Value Ref Range Status   Specimen Description BLOOD LEFT A  Final   Special Requests   Final    BOTTLES DRAWN AEROBIC AND ANAEROBIC AER 10ML ANA 9ML   Culture NO GROWTH < 24 HOURS  Final   Report Status PENDING  Incomplete    Coagulation Studies: No results for input(s): LABPROT, INR in the last 72 hours.  Urinalysis:  Recent Labs  01/24/16 1540  COLORURINE YELLOW*  LABSPEC 1.013  PHURINE 7.0  GLUCOSEU NEGATIVE  HGBUR NEGATIVE  BILIRUBINUR NEGATIVE  KETONESUR NEGATIVE  PROTEINUR 30*  NITRITE NEGATIVE  LEUKOCYTESUR NEGATIVE      Imaging: Dg Chest 2 View  01/24/2016  CLINICAL DATA:  Fever, history of COPD, left node malignancy and nasopharygeal carcinoma. EXAM: CHEST  2 VIEW COMPARISON:  Chest x-ray of August 20, 2015 FINDINGS: The lungs remain hyperinflated. There is hazy increased density in the right mid lung. There is subtle increased density in the retrocardiac region. The heart and pulmonary  vascularity are normal. The mediastinum is normal in width. There is no pleural effusion or pneumothorax. The observed bony thorax exhibits no acute abnormality. A metallic pellet lies over the lateral aspect of the left third rib and is stable. There is underlying rib deformity that is also stable. IMPRESSION: Subtle increased density in the right mid lung and at the left lung base may reflect early pneumonia. There is underlying COPD. Followup PA and lateral chest X-ray is recommended in 3-4 weeks following trial of antibiotic therapy to ensure resolution and exclude underlying malignancy. Electronically Signed   By: David  Martinique M.D.   On: 01/24/2016 07:50   Dg Pelvis 1-2 Views  01/23/2016  CLINICAL DATA:  Patient with left posterior pelvic pain. No known injury. Initial encounter. EXAM: PELVIS - 1-2 VIEW COMPARISON:  Lumbar spine radiograph 05/08/2012. FINDINGS: Lumbar spine degenerative changes. SI joints are unremarkable. Mild bilateral hip joint degenerative changes. Vascular calcifications. No evidence for acute fracture. IMPRESSION: No acute osseous abnormality. Mild bilateral hip joint and lumbar spine degenerative changes. Electronically Signed   By: Lovey Newcomer M.D.   On: 01/23/2016 14:15     Medications:     . docusate sodium  100 mg Oral BID  . enoxaparin (LOVENOX) injection  40 mg Subcutaneous Q24H  . feeding supplement (ENSURE ENLIVE)  237 mL Oral BID BM  . folic acid  1 mg Oral Daily  . [START ON 01/26/2016] levofloxacin (LEVAQUIN) IV  750 mg Intravenous Q48H  . levothyroxine  50 mcg Oral QAC breakfast  . LORazepam  0-4 mg Intravenous Q12H  . metoprolol tartrate  50 mg Oral BID  . mirtazapine  15 mg Oral QHS  . multivitamin with minerals  1 tablet Oral Daily  . nicotine  21 mg Transdermal Daily  . thiamine  100 mg Oral Daily   Or  . thiamine  100 mg Intravenous Daily  . tiotropium  18 mcg Inhalation Daily  . triamcinolone cream   Topical BID   acetaminophen **OR**  acetaminophen, hydrALAZINE, ondansetron **OR** ondansetron (ZOFRAN) IV  Assessment/ Plan:  Mr. COLSYN MCGUFFEY is a 65 y.o. white male with EtOH abuse, nasal and tongue cancer status post resection, BPH, hypertension, who was  admitted to [facility] on 01/22/2016 for Alcohol abuse [F10.10] Hyponatremia [E87.1]   1. Hyponatremia: history suggestive related to EtOH either from beer potomania or from underlyign SIADH. Seems chronic. Baseline ~130 - Fluid restriction  2. Hypertension: with elevations.  - amlodipine and metoprolol  3. Alcohol abuse:  - agree with thiamine and folic acid.    LOS: [place], Jacquel Redditt 6/20/201711:03 AM

## 2016-01-25 NOTE — Evaluation (Signed)
Physical Therapy Evaluation Patient Details Name: Thomas [name] MRN: [id number] DOB: 1950-10-21 Today's Date: 01/25/2016   History of Present Illness  Pt is a 65 y.o. male presenting to hospital seeking detox 01/22/16 and admitted for hyponatremia.  PMH includes htn, CA of the nasal cavities and tonge, COPD, and ETOH abuse.  Clinical Impression  Prior to admission, pt was independent with functional mobility.  Pt stays with different friends (does not have a specific place he calls "home").  Currently pt is SBA with bed mobility and min assist with transfers and ambulation 15 feet with RW (limited distance d/t fatigue).  Pt very "shaky" in general which increases pt's balance impairments with standing and functional mobility; pt appears with increased steadiness with use of RW but still requires additional assist to steady/balance.  Pt would benefit from skilled PT to address noted impairments and functional limitations.  D/t current assist levels, recommend pt discharge to STR when medically appropriate.     Follow Up Recommendations  (STR pending pt's progress)    Equipment Recommendations  Rolling walker with 5" wheels    Recommendations for Other Services       Precautions / Restrictions Precautions Precautions: Fall Restrictions Weight Bearing Restrictions: No      Mobility  Bed Mobility Overal bed mobility: Needs Assistance Bed Mobility: Supine to Sit;Sit to Supine     Supine to sit: Supervision;HOB elevated Sit to supine: Supervision;HOB elevated   General bed mobility comments: increased effort to perform  Transfers Overall transfer level: Needs assistance Equipment used: Rolling walker (2 wheeled) Transfers: Sit to/from Stand Sit to Stand: Min assist         General transfer comment: assist to steady without RW (sit to/from stand); vc's required for hand placement and use of RW for transfers  Ambulation/Gait Ambulation/Gait assistance: Min assist Ambulation  Distance (Feet): 15 Feet Assistive device: Rolling walker (2 wheeled)   Gait velocity: decreased   General Gait Details: decreased B step length/foot clearance/heelstrike; pt "shaking" in general and B knees intermittently flexing; limited distance d/t fatigue  Stairs            Wheelchair Mobility    Modified Rankin (Stroke Patients Only)       Balance Overall balance assessment: Needs assistance Sitting-balance support: Bilateral upper extremity supported;Feet supported Sitting balance-Leahy Scale: Fair     Standing balance support: No upper extremity supported Standing balance-Leahy Scale: Poor Standing balance comment: posterior lean upon standing and unsteady standing without use of RW                             Pertinent Vitals/Pain Pain Assessment: No/denies pain  Vitals stable and WFL throughout treatment session.    Home Living Family/patient expects to be discharged to:: Other (Comment) (Pt reports that he does not have a home and stays with different friends.)                      Prior Function Level of Independence: Independent               Hand Dominance        Extremity/Trunk Assessment   Upper Extremity Assessment: Generalized weakness           Lower Extremity Assessment: Generalized weakness         Communication   Communication: HOH  Cognition Arousal/Alertness: Awake/alert Behavior During Therapy: WFL for tasks assessed/performed Overall Cognitive Status: Within  Functional Limits for tasks assessed                      General Comments  Pt agreeable to PT session.    Exercises        Assessment/Plan    PT Assessment Patient needs continued PT services  PT Diagnosis Difficulty walking;Generalized weakness   PT Problem List Decreased strength;Decreased activity tolerance;Decreased balance;Decreased mobility  PT Treatment Interventions DME instruction;Gait training;Stair  training;Functional mobility training;Therapeutic activities;Therapeutic exercise;Balance training;Patient/family education   PT Goals (Current goals can be found in the Care Plan section) Acute Rehab PT Goals Patient Stated Goal: to improve balance PT Goal Formulation: With patient Time For Goal Achievement: 02/08/16 Potential to Achieve Goals: Fair    Frequency Min 2X/week   Barriers to discharge Decreased caregiver support      Co-evaluation               End of Session Equipment Utilized During Treatment: Gait belt Activity Tolerance: Patient limited by fatigue Patient left: in bed;with call bell/phone within reach;with bed alarm set;with nursing/sitter in room Nurse Communication: Mobility status;Precautions         Time: [id number] PT Time Calculation (min) (ACUTE ONLY): 15 min   Charges:   PT Evaluation $PT Eval Low Complexity: 1 Procedure     PT G CodesLeitha Bleak 02-14-2016, 1:30 PM Leitha Bleak, [place]

## 2016-01-25 NOTE — NC FL2 (Signed)
[place] LEVEL OF CARE SCREENING TOOL     IDENTIFICATION  Patient Name: Thomas [name] Birthdate: 1951/04/05 Sex: male Admission Date (Current Location): 01/22/2016  [place] and [place] Number:  Engineering geologist and Address:  [facility], [address], [place],  [address]      Provider Number: [id number]  Attending Physician Name and Address:  [name], MD  Relative Name and Phone Number:       Current Level of Care: Hospital Recommended Level of Care: [place] Prior Approval Number:    Date Approved/Denied:   PASRR Number: [id number]  Discharge Plan: SNF    Current Diagnoses: Patient Active Problem List   Diagnosis Date Noted  . Protein-calorie malnutrition, severe 01/22/2016  . Dehydration 08/17/2015  . Seizure ([place]) 12/12/2014  . Alcohol abuse 12/12/2014  . Hyponatremia 12/12/2014  . Essential hypertension 12/12/2014  . COPD (chronic obstructive pulmonary disease) ([place]) 12/12/2014    Orientation RESPIRATION BLADDER Height & Weight     Self, Time, Situation, Place  Normal Continent Weight: 123 lb 9.6 oz (56.065 kg) Height:  5\' 7"  (170.2 cm)  BEHAVIORAL SYMPTOMS/MOOD NEUROLOGICAL BOWEL NUTRITION STATUS      Continent Diet (DYS 3)  AMBULATORY STATUS COMMUNICATION OF NEEDS Skin   Limited Assist Verbally Normal                       Personal Care Assistance Level of Assistance  Bathing, Feeding, Dressing Bathing Assistance: Limited assistance Feeding assistance: Independent Dressing Assistance: Limited assistance     Functional Limitations Info  Sight, Hearing, Speech Sight Info: Adequate Hearing Info: Adequate Speech Info: Adequate    SPECIAL CARE FACTORS FREQUENCY  PT (By licensed PT)     PT Frequency: 5              Contractures      Additional Factors Info  Code Status, Allergies Code Status Info: Full Code Allergies Info: No known allergies          Current Medications (01/25/2016):  This is the current hospital active medication list Current Facility-Administered Medications  Medication Dose Route Frequency Provider Last Rate Last Dose  . acetaminophen (TYLENOL) tablet 650 mg  650 mg Oral Q6H PRN Harrie Foreman, MD   650 mg at 01/24/16 [id number]   Or  . acetaminophen (TYLENOL) suppository 650 mg  650 mg Rectal Q6H PRN Harrie Foreman, MD      . docusate sodium (COLACE) capsule 100 mg  100 mg Oral BID Harrie Foreman, MD   100 mg at 01/25/16 0943  . enoxaparin (LOVENOX) injection 40 mg  40 mg Subcutaneous Q24H Harrie Foreman, MD   40 mg at 01/24/16 2142  . feeding supplement (ENSURE ENLIVE) (ENSURE ENLIVE) liquid 237 mL  237 mL Oral BID BM [name], MD   237 mL at 01/25/16 0946  . folic acid (FOLVITE) tablet 1 mg  1 mg Oral Daily Harrie Foreman, MD   1 mg at 01/25/16 0943  . hydrALAZINE (APRESOLINE) injection 10 mg  10 mg Intravenous Q6H PRN [name], MD      . Derrill Memo ON 01/26/2016] levofloxacin (LEVAQUIN) IVPB 750 mg  750 mg Intravenous Q48H [name], MD      . levothyroxine (SYNTHROID, LEVOTHROID) tablet 50 mcg  50 mcg Oral QAC breakfast [name], MD   50 mcg at 01/25/16 0943  . LORazepam (ATIVAN) injection 0-4 mg  0-4 mg  Intravenous Q12H Harrie Foreman, MD   1 mg at 01/24/16 0914  . metoprolol (LOPRESSOR) tablet 50 mg  50 mg Oral BID [name], MD   50 mg at 01/25/16 0943  . mirtazapine (REMERON) tablet 15 mg  15 mg Oral QHS [name], MD   15 mg at 01/24/16 2142  . multivitamin with minerals tablet 1 tablet  1 tablet Oral Daily Harrie Foreman, MD   1 tablet at 01/25/16 [phone]  . nicotine (NICODERM CQ - dosed in mg/24 hours) patch 21 mg  21 mg Transdermal Daily [name], MD   21 mg at 01/25/16 0944  . ondansetron (ZOFRAN) tablet 4 mg  4 mg Oral Q6H PRN Harrie Foreman, MD       Or  . ondansetron [facility]) injection 4 mg  4 mg Intravenous Q6H PRN Harrie Foreman,  MD   4 mg at 01/23/16 0758  . thiamine (VITAMIN B-1) tablet 100 mg  100 mg Oral Daily Harrie Foreman, MD   100 mg at 01/25/16 [id number]   Or  . thiamine (B-1) injection 100 mg  100 mg Intravenous Daily Harrie Foreman, MD      . tiotropium [facility]) inhalation capsule 18 mcg  18 mcg Inhalation Daily Harrie Foreman, MD   18 mcg at 01/25/16 0944  . triamcinolone cream (KENALOG) 0.1 %   Topical BID [name], MD         Discharge Medications: Please see discharge summary for a list of discharge medications.  Relevant Imaging Results:  Relevant Lab Results:   Additional Information SSN:  [id number]  Darden Dates, LCSW

## 2016-01-25 NOTE — Progress Notes (Signed)
Pharmacy Antibiotic Note  Thomas [name] is a 65 y.o. male admitted on 01/22/2016 with pneumonia.  Pharmacy has been consulted for levofloxacin dosing.  Plan: Renal function worsened. Will change to Levofloxacin 750 mg IV q48h.  Height: 5\' 7"  (170.2 cm) Weight: 123 lb 9.6 oz (56.065 kg) IBW/kg (Calculated) : 66.1  Temp (24hrs), Avg:98.1 F (36.7 C), Min:97.8 F (36.6 C), Max:98.3 F (36.8 C)   Recent Labs Lab 01/22/16 0400 01/23/16 0324 01/24/16 0519 01/25/16 0630  WBC 5.2  --   --  8.3  CREATININE 1.12 1.08 1.18 1.38*    Estimated Creatinine Clearance: 42.3 mL/min (by C-G formula based on Cr of 1.38).    No Known Allergies  Antimicrobials this admission: Levofloxacin 6/19 >>    Dose adjustments this admission:   Microbiology results: 6/19 BCx: NGTD   Thank you for allowing pharmacy to be a part of this patient's care.  Rocky Morel 01/25/2016 8:23 AM

## 2016-01-26 LAB — SODIUM: Sodium: 128 mmol/L — ABNORMAL LOW (ref 135–145)

## 2016-01-26 MED ORDER — ALUM & MAG HYDROXIDE-SIMETH 200-200-20 MG/5ML PO SUSP
30.0000 mL | ORAL | Status: DC | PRN
  Administered 2016-01-26: 30 mL via ORAL
  Filled 2016-01-26: qty 30

## 2016-01-26 NOTE — Care Management Important Message (Signed)
Important Message  Patient Details  Name: Thomas [name] MRN: [id number] Date of Birth: 02-16-1951   Medicare Important Message Given:  Yes    [name] 01/26/2016, 9:33 AM

## 2016-01-26 NOTE — Progress Notes (Signed)
EMS here to transport patient to [facility] distress noted.

## 2016-01-26 NOTE — Clinical Social Work Note (Signed)
Pt ready for discharge today. CSW presented bed offers. Pt chose [facility]. CSW updated facility and they are able to accept pt as they have received discharge information. Pt is aware and agreeable to discharge plan. Pt stated that he will call family. RN called reported and EMS will provide transportation to facility. CSW is singing off as no further needs identified.   Darden Dates, MSW, LCSW  Clinical Social Worker  [phone]

## 2016-01-26 NOTE — Progress Notes (Signed)
Report called to [facility] at [facility] transport patient via EMS to room 10A.

## 2016-01-26 NOTE — Clinical Social Work Note (Signed)
CSW reconsulted for New SNF. PT is recommending SNF placement at discharge. CSW addressed discharge plan with pt and he is agreeable to STR at [facility]. CSW initiated SNF search and will follow up with bed offers. CSW offered to call family, which pt declined. CSW will continue to follow.   Darden Dates, MSW, LCSW  Clinical Social Worker  [phone]

## 2016-01-26 NOTE — Discharge Summary (Signed)
[place] [facility] [place] NAME: Thomas [name]    MR#:  [id number]  DATE OF BIRTH:  12/04/1950  DATE OF ADMISSION:  01/22/2016 ADMITTING PHYSICIAN: Harrie Foreman, MD  DATE OF DISCHARGE: 01/26/2016  PRIMARY CARE PHYSICIAN: [facility]    ADMISSION DIAGNOSIS:  Alcohol abuse [F10.10] Hyponatremia [E87.1]  DISCHARGE DIAGNOSIS:  Active Problems:   Hyponatremia   Protein-calorie malnutrition, severe   SECONDARY DIAGNOSIS:   Past Medical History  Diagnosis Date  . Hypertension   . Cancer of nasal cavities ([place])   . Enlarged prostate   . Tongue cancer ([place])   . Renal disorder   . Lung infection     no lung cancer  . Radiation     to neck  . Status post chemotherapy   . Skin cancer     HOSPITAL COURSE:    65y/o M with PMH of alcohol use, HTN presents to the hospital secondary to secondary to weakness and noted to have hyponatremia.  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 Appreciate nephrology consult. -Continue fluid restriction upto 1200cc/day   #2 Hypothyroidism- TSH elevated, Low T4 - started on synthroid  #3 Alcohol abuse- CIWA protocol Improving.   #4 Pneumonia. Chest x-ray with bibasilar infiltrates. -Blood cultures negative so far. Urine analysis with no infection. Continue Levaquin for now  #5 hypertension-on metoprolol. 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 Need to follow-up with [facility] after discharge  #7 insomnia-started on Remeron at 7.5 mg at bedtime with improvement  Physical therapy consult recommended rehabilitation as patient was unsteady on his feet and not safe to go back  home by himself.  DISCHARGE CONDITIONS:   Guarded  CONSULTS OBTAINED:  Treatment Team:  Lavonia Dana, MD  DRUG ALLERGIES:  No Known Allergies  DISCHARGE MEDICATIONS:   Current Discharge Medication List    START taking these medications   Details  Fluticasone-Salmeterol (ADVAIR DISKUS) 250-50 MCG/DOSE AEPB Inhale 1 puff into the lungs 2 (two) times daily. Qty: 60 each, Refills: 1    levofloxacin (LEVAQUIN) 500 MG tablet Take 1 tablet (500 mg total) by mouth daily. X 6 more days Qty: 6 tablet, Refills: 0    levothyroxine (SYNTHROID, LEVOTHROID) 50 MCG tablet Take 1 tablet (50 mcg total) by mouth daily before breakfast. Qty: 30 tablet, Refills: 2    metoprolol (LOPRESSOR) 50 MG tablet Take 1 tablet (50 mg total) by mouth 2 (two) times daily. Qty: 60 tablet, Refills: 2    mirtazapine (REMERON) 7.5 MG tablet Take 1 tablet (7.5 mg total) by mouth at bedtime. Qty: 30 tablet, Refills: 2    tiotropium (SPIRIVA) 18 MCG inhalation capsule Place 1 capsule (18 mcg total) into inhaler and inhale daily. Qty: 30 capsule, Refills: 2      CONTINUE these medications which have NOT CHANGED   Details  Multiple Vitamin (MULTIVITAMIN WITH MINERALS) TABS tablet Take 1 tablet by mouth daily. Qty: 30 tablet, Refills: 0      STOP taking these medications     cephALEXin (KEFLEX) 500 MG capsule      folic acid (FOLVITE) 1 MG tablet          DISCHARGE INSTRUCTIONS:   1.  [facility] follow-up for oncology department in 2 weeks 2. PCP follow-up in 1-2 weeks   If you experience worsening of your admission symptoms, develop shortness of breath, life threatening emergency, suicidal or homicidal thoughts you must seek medical attention immediately by calling 911 or calling your MD immediately  if symptoms less severe.  You Must read complete instructions/literature along with all the possible adverse reactions/side effects for all the Medicines you take and that have been prescribed to you. Take  any new Medicines after you have completely understood and accept all the possible adverse reactions/side effects.   Please note  You were cared for by a hospitalist during your hospital stay. If you have any questions about your discharge medications or the care you received while you were in the hospital after you are discharged, you can call the unit and asked to speak with the hospitalist on call if the hospitalist that took care of you is not available. Once you are discharged, your primary care physician will handle any further medical issues. Please note that NO REFILLS for any discharge medications will be authorized once you are discharged, as it is imperative that you return to your primary care physician (or establish a relationship with a primary care physician if you do not have one) for your aftercare needs so that they can reassess your need for medications and monitor your lab values.    Today   CHIEF COMPLAINT:   Chief Complaint  Patient presents with  . Alcohol Problem    VITAL SIGNS:  Blood pressure 148/69, pulse 81, temperature 98.7 F (37.1 C), temperature source Oral, resp. rate 18, height 5\' 7"  (1.702 m), weight 61.054 kg (134 lb 9.6 oz), SpO2 100 %.  I/O:    Intake/Output Summary (Last 24 hours) at 01/26/16 0852 Last data filed at 01/25/16 1900  Gross per 24 hour  Intake    840 ml  Output    400 ml  Net    440 ml    PHYSICAL EXAMINATION:   Physical Exam  GENERAL: 65 y.o.-year-old disshelved patient lying in the bed, very sleepy.  EYES: Pupils equal, round, reactive to light and accommodation. No scleral icterus. Extraocular muscles intact.  HEENT: Head atraumatic, normocephalic. Oropharynx and nasopharynx clear.  NECK: Supple, no jugular venous distention. No thyroid enlargement, no tenderness.  LUNGS: Normal breath sounds bilaterally, no wheezing, rales,rhonchi or crepitation. No use of accessory muscles of respiration. Decreased bibasilar breath  sounds CARDIOVASCULAR: S1, S2 normal. No murmurs, rubs, or gallops.  ABDOMEN: Soft, nontender, nondistended. Bowel sounds present. No organomegaly or mass.  EXTREMITIES: No pedal edema, cyanosis, or clubbing.  NEUROLOGIC: Cranial nerves II through XII are intact. Muscle strength 5/5 in all extremities. Sensation intact. Gait not checked.  PSYCHIATRIC: The patient is alert and oriented x 3.  SKIN: Dry skin noted on both legs. No rash noted. Chronic hyperpigmentation from eczema noted    DATA REVIEW:   CBC  Recent Labs Lab 01/25/16 0630  WBC 8.3  HGB 11.2*  HCT 32.4*  PLT 182    Chemistries   Recent Labs Lab 01/22/16 0400  01/25/16 0630  NA 115*  < > 127*  K 4.3  < > 4.2  CL 75*  < > 91*  CO2 27  < > 28  GLUCOSE 93  < > 96  BUN 6  < > 15  CREATININE 1.12  < > 1.38*  CALCIUM 9.5  < > 8.6*  AST 91*  --   --  ALT 60  --   --   ALKPHOS 106  --   --   BILITOT 0.6  --   --   < > = values in this interval not displayed.  Cardiac Enzymes No results for input(s): TROPONINI in the last 168 hours.  Microbiology Results  Results for orders placed or performed during the hospital encounter of 01/22/16  CULTURE, BLOOD (ROUTINE X 2) w Reflex to ID Panel     Status: None (Preliminary result)   Collection Time: 01/24/16  3:15 PM  Result Value Ref Range Status   Specimen Description BLOOD RIGHT A  Final   Special Requests   Final    BOTTLES DRAWN AEROBIC AND ANAEROBIC AER 10ML ANA 5ML   Culture NO GROWTH < 24 HOURS  Final   Report Status PENDING  Incomplete  CULTURE, BLOOD (ROUTINE X 2) w Reflex to ID Panel     Status: None (Preliminary result)   Collection Time: 01/24/16  3:15 PM  Result Value Ref Range Status   Specimen Description BLOOD LEFT A  Final   Special Requests   Final    BOTTLES DRAWN AEROBIC AND ANAEROBIC AER 10ML ANA 9ML   Culture NO GROWTH < 24 HOURS  Final   Report Status PENDING  Incomplete    RADIOLOGY:  No results found.  EKG:   Orders placed  or performed during the hospital encounter of 12/11/14  . EKG (if new onset seizures)  . EKG (if new onset seizures)  . EKG 12-Lead  . EKG 12-Lead  . EKG 12-Lead  . EKG 12-Lead  . EKG      Management plans discussed with the patient, family and they are in agreement.  CODE STATUS:     Code Status Orders        Start     Ordered   01/22/16 0553  Full code   Continuous     01/22/16 0552    Code Status History    Date Active Date Inactive Code Status Order ID Comments User Context   08/17/2015 10:29 PM 08/21/2015  6:00 PM Full Code [id number]  Lance Coon, MD Inpatient   12/12/2014  1:19 AM 12/18/2014  4:59 PM Full Code [id number]  Lytle Butte, MD ED      TOTAL TIME TAKING CARE OF THIS PATIENT: 37 minutes.    [name] M.D on 01/26/2016 at 8:52 AM  Between 7am to 6pm - Pager - [phone]  After 6pm go to [email or web address] - password [email or web address] [facility]  [place] [facility]  Office  [phone]  CC: Primary care physician; [place]

## 2016-01-26 NOTE — Clinical Social Work Placement (Signed)
   CLINICAL SOCIAL WORK PLACEMENT  NOTE  Date:  01/26/2016  Patient Details  Name: Thomas [name] MRN: [id number] Date of Birth: [birth year]  Clinical Social Work is seeking post-discharge placement for this patient at the [place] level of care (*CSW will initial, date and re-position this form in  chart as items are completed):  Yes   Patient/family provided with [facility] Work Department's list of facilities offering this level of care within the geographic area requested by the patient (or if unable, by the patient's family).  Yes   Patient/family informed of their freedom to choose among providers that offer the needed level of care, that participate in Medicare, Medicaid or managed care program needed by the patient, have an available bed and are willing to accept the patient.  Yes   Patient/family informed of [facility]'s ownership interest in [facility] and [facility], as well as of the fact that they are under no obligation to receive care at these facilities.  PASRR submitted to EDS on 01/25/16     PASRR number received on 01/25/16     Existing PASRR number confirmed on       FL2 transmitted to all facilities in geographic area requested by pt/family on 01/25/16     FL2 transmitted to all facilities within larger geographic area on       Patient informed that his/her managed care company has contracts with or will negotiate with certain facilities, including the following:        Yes   Patient/family informed of bed offers received.  Patient chooses bed at [facility]     Physician recommends and patient chooses bed at  [facility])    Patient to be transferred to [facility] on 01/26/16.  Patient to be transferred to facility by [facility]     Patient family notified on 01/26/16 of transfer.  Name of family member notified:  Pt declined and stated that he will notify family.      PHYSICIAN        Additional Comment:    _______________________________________________ Darden Dates, LCSW 01/26/2016, 5:02 PM

## 2016-01-29 LAB — CULTURE, BLOOD (ROUTINE X 2)
CULTURE: NO GROWTH
Culture: NO GROWTH

## 2016-02-06 ENCOUNTER — Emergency Department: Payer: Medicare Other

## 2016-02-06 ENCOUNTER — Inpatient Hospital Stay
Admission: EM | Admit: 2016-02-06 | Discharge: 2016-02-09 | DRG: 641 | Disposition: A | Discharge status: Home / Self Care | Payer: Medicare Other | Attending: Internal Medicine | Admitting: Internal Medicine

## 2016-02-06 DIAGNOSIS — G4700 Insomnia, unspecified: Secondary | ICD-10-CM | POA: Diagnosis present

## 2016-02-06 DIAGNOSIS — F10239 Alcohol dependence with withdrawal, unspecified: Secondary | ICD-10-CM | POA: Diagnosis present

## 2016-02-06 DIAGNOSIS — F1020 Alcohol dependence, uncomplicated: Secondary | ICD-10-CM | POA: Diagnosis present

## 2016-02-06 DIAGNOSIS — C01 Malignant neoplasm of base of tongue: Secondary | ICD-10-CM | POA: Diagnosis present

## 2016-02-06 DIAGNOSIS — F17210 Nicotine dependence, cigarettes, uncomplicated: Secondary | ICD-10-CM | POA: Diagnosis present

## 2016-02-06 DIAGNOSIS — J449 Chronic obstructive pulmonary disease, unspecified: Secondary | ICD-10-CM | POA: Diagnosis present

## 2016-02-06 DIAGNOSIS — E039 Hypothyroidism, unspecified: Secondary | ICD-10-CM | POA: Diagnosis present

## 2016-02-06 DIAGNOSIS — F1010 Alcohol abuse, uncomplicated: Secondary | ICD-10-CM | POA: Diagnosis not present

## 2016-02-06 DIAGNOSIS — Z85828 Personal history of other malignant neoplasm of skin: Secondary | ICD-10-CM

## 2016-02-06 DIAGNOSIS — E871 Hypo-osmolality and hyponatremia: Secondary | ICD-10-CM | POA: Diagnosis not present

## 2016-02-06 DIAGNOSIS — E222 Syndrome of inappropriate secretion of antidiuretic hormone: Secondary | ICD-10-CM | POA: Diagnosis present

## 2016-02-06 DIAGNOSIS — F10229 Alcohol dependence with intoxication, unspecified: Secondary | ICD-10-CM | POA: Diagnosis present

## 2016-02-06 DIAGNOSIS — Z8249 Family history of ischemic heart disease and other diseases of the circulatory system: Secondary | ICD-10-CM

## 2016-02-06 DIAGNOSIS — I10 Essential (primary) hypertension: Secondary | ICD-10-CM | POA: Diagnosis present

## 2016-02-06 DIAGNOSIS — Z9221 Personal history of antineoplastic chemotherapy: Secondary | ICD-10-CM

## 2016-02-06 DIAGNOSIS — N400 Enlarged prostate without lower urinary tract symptoms: Secondary | ICD-10-CM | POA: Diagnosis present

## 2016-02-06 DIAGNOSIS — E43 Unspecified severe protein-calorie malnutrition: Secondary | ICD-10-CM

## 2016-02-06 LAB — CBC
HEMATOCRIT: 32.3 % — AB (ref 40.0–52.0)
Hemoglobin: 11.8 g/dL — ABNORMAL LOW (ref 13.0–18.0)
MCH: 31.7 pg (ref 26.0–34.0)
MCHC: 36.4 g/dL — AB (ref 32.0–36.0)
MCV: 86.9 fL (ref 80.0–100.0)
PLATELETS: 335 10*3/uL (ref 150–440)
RBC: 3.71 MIL/uL — ABNORMAL LOW (ref 4.40–5.90)
RDW: 17.3 % — AB (ref 11.5–14.5)
WBC: 4.7 10*3/uL (ref 3.8–10.6)

## 2016-02-06 MED ORDER — THIAMINE HCL 100 MG/ML IJ SOLN
Freq: Once | INTRAVENOUS | Status: AC
  Administered 2016-02-07: 01:00:00 via INTRAVENOUS
  Filled 2016-02-06: qty 1000

## 2016-02-06 NOTE — ED Notes (Signed)
CXR complete.

## 2016-02-06 NOTE — ED Notes (Signed)
Patient to ED via EMS for decreased appetite most likely related to excessive alcohol intake daily per patient. Patient states he drinks 5-6 forty (40) ounce beers daily. Has had infrequent episodes of vomiting and denies coffee ground emesis or dark tarry stools. Patient is a poor historian and difficult to understand. Per EMS was at a friends house and the friend called because patient "is not well." Patient has nonproductive cough. States he is a smoker.

## 2016-02-07 DIAGNOSIS — I10 Essential (primary) hypertension: Secondary | ICD-10-CM | POA: Diagnosis present

## 2016-02-07 DIAGNOSIS — Z85810 Personal history of malignant neoplasm of tongue: Secondary | ICD-10-CM | POA: Diagnosis not present

## 2016-02-07 DIAGNOSIS — E222 Syndrome of inappropriate secretion of antidiuretic hormone: Secondary | ICD-10-CM | POA: Diagnosis present

## 2016-02-07 DIAGNOSIS — Z8522 Personal history of malignant neoplasm of nasal cavities, middle ear, and accessory sinuses: Secondary | ICD-10-CM | POA: Diagnosis not present

## 2016-02-07 DIAGNOSIS — F1020 Alcohol dependence, uncomplicated: Secondary | ICD-10-CM

## 2016-02-07 DIAGNOSIS — E871 Hypo-osmolality and hyponatremia: Secondary | ICD-10-CM | POA: Diagnosis present

## 2016-02-07 DIAGNOSIS — F10229 Alcohol dependence with intoxication, unspecified: Secondary | ICD-10-CM | POA: Diagnosis present

## 2016-02-07 DIAGNOSIS — Z79899 Other long term (current) drug therapy: Secondary | ICD-10-CM | POA: Diagnosis not present

## 2016-02-07 DIAGNOSIS — R4781 Slurred speech: Secondary | ICD-10-CM | POA: Diagnosis not present

## 2016-02-07 DIAGNOSIS — R531 Weakness: Secondary | ICD-10-CM | POA: Diagnosis present

## 2016-02-07 DIAGNOSIS — J449 Chronic obstructive pulmonary disease, unspecified: Secondary | ICD-10-CM | POA: Diagnosis present

## 2016-02-07 DIAGNOSIS — F17210 Nicotine dependence, cigarettes, uncomplicated: Secondary | ICD-10-CM | POA: Diagnosis present

## 2016-02-07 DIAGNOSIS — F1010 Alcohol abuse, uncomplicated: Secondary | ICD-10-CM | POA: Diagnosis present

## 2016-02-07 DIAGNOSIS — G4700 Insomnia, unspecified: Secondary | ICD-10-CM | POA: Diagnosis present

## 2016-02-07 DIAGNOSIS — Z8249 Family history of ischemic heart disease and other diseases of the circulatory system: Secondary | ICD-10-CM | POA: Diagnosis not present

## 2016-02-07 DIAGNOSIS — C01 Malignant neoplasm of base of tongue: Secondary | ICD-10-CM | POA: Diagnosis present

## 2016-02-07 DIAGNOSIS — Z85828 Personal history of other malignant neoplasm of skin: Secondary | ICD-10-CM | POA: Diagnosis not present

## 2016-02-07 DIAGNOSIS — F10239 Alcohol dependence with withdrawal, unspecified: Secondary | ICD-10-CM | POA: Diagnosis present

## 2016-02-07 DIAGNOSIS — N400 Enlarged prostate without lower urinary tract symptoms: Secondary | ICD-10-CM | POA: Diagnosis present

## 2016-02-07 DIAGNOSIS — E039 Hypothyroidism, unspecified: Secondary | ICD-10-CM | POA: Diagnosis present

## 2016-02-07 DIAGNOSIS — Z9221 Personal history of antineoplastic chemotherapy: Secondary | ICD-10-CM | POA: Diagnosis not present

## 2016-02-07 LAB — COMPREHENSIVE METABOLIC PANEL
ALBUMIN: 4.3 g/dL (ref 3.5–5.0)
ALK PHOS: 116 U/L (ref 38–126)
ALT: 14 U/L — AB (ref 17–63)
AST: 26 U/L (ref 15–41)
Anion gap: 10 (ref 5–15)
BILIRUBIN TOTAL: 0.5 mg/dL (ref 0.3–1.2)
BUN: 5 mg/dL — AB (ref 6–20)
CALCIUM: 8.5 mg/dL — AB (ref 8.9–10.3)
CO2: 25 mmol/L (ref 22–32)
Chloride: 76 mmol/L — ABNORMAL LOW (ref 101–111)
Creatinine, Ser: 1.01 mg/dL (ref 0.61–1.24)
GFR calc Af Amer: >60 mL/min (ref >60)
GFR calc non Af Amer: >60 mL/min (ref >60)
GLUCOSE: 84 mg/dL (ref 65–99)
Potassium: 3.7 mmol/L (ref 3.5–5.1)
Sodium: 111 mmol/L — CL (ref 135–145)
TOTAL PROTEIN: 7.4 g/dL (ref 6.5–8.1)

## 2016-02-07 LAB — BASIC METABOLIC PANEL
Anion gap: 7 (ref 5–15)
BUN: 9 mg/dL (ref 6–20)
CHLORIDE: 88 mmol/L — AB (ref 101–111)
CO2: 25 mmol/L (ref 22–32)
CREATININE: 1.15 mg/dL (ref 0.61–1.24)
Calcium: 8.1 mg/dL — ABNORMAL LOW (ref 8.9–10.3)
GFR calc non Af Amer: >60 mL/min (ref >60)
Glucose, Bld: 180 mg/dL — ABNORMAL HIGH (ref 65–99)
POTASSIUM: 3.9 mmol/L (ref 3.5–5.1)
SODIUM: 120 mmol/L — AB (ref 135–145)

## 2016-02-07 LAB — AMMONIA: AMMONIA: 11 umol/L (ref 9–35)

## 2016-02-07 LAB — ETHANOL: ALCOHOL ETHYL (B): 184 mg/dL — AB (ref <5)

## 2016-02-07 LAB — TSH: TSH: 19.265 u[IU]/mL — ABNORMAL HIGH (ref 0.350–4.500)

## 2016-02-07 LAB — LIPASE, BLOOD: LIPASE: 41 U/L (ref 11–51)

## 2016-02-07 LAB — HEMOGLOBIN A1C: Hgb A1c MFr Bld: 5.3 % (ref 4.0–6.0)

## 2016-02-07 MED ORDER — MOMETASONE FURO-FORMOTEROL FUM 200-5 MCG/ACT IN AERO
2.0000 | INHALATION_SPRAY | Freq: Two times a day (BID) | RESPIRATORY_TRACT | Status: DC
  Administered 2016-02-07 – 2016-02-09 (×5): 2 via RESPIRATORY_TRACT
  Filled 2016-02-07 (×2): qty 8.8

## 2016-02-07 MED ORDER — ENSURE ENLIVE PO LIQD
237.0000 mL | Freq: Two times a day (BID) | ORAL | Status: DC
  Administered 2016-02-07: 237 mL via ORAL

## 2016-02-07 MED ORDER — DOCUSATE SODIUM 100 MG PO CAPS
100.0000 mg | ORAL_CAPSULE | Freq: Two times a day (BID) | ORAL | Status: DC
  Administered 2016-02-07 – 2016-02-09 (×5): 100 mg via ORAL
  Filled 2016-02-07 (×5): qty 1

## 2016-02-07 MED ORDER — VITAMIN B-1 100 MG PO TABS
100.0000 mg | ORAL_TABLET | Freq: Every day | ORAL | Status: DC
  Administered 2016-02-07 – 2016-02-09 (×3): 100 mg via ORAL
  Filled 2016-02-07 (×3): qty 1

## 2016-02-07 MED ORDER — ONDANSETRON HCL 4 MG/2ML IJ SOLN: 4.0000 mg | Freq: Four times a day (QID) | INTRAMUSCULAR | Status: DC | PRN

## 2016-02-07 MED ORDER — SODIUM CHLORIDE 0.9 % IV SOLN
INTRAVENOUS | Status: DC
  Administered 2016-02-07 – 2016-02-08 (×3): via INTRAVENOUS

## 2016-02-07 MED ORDER — MIRTAZAPINE 15 MG PO TABS
7.5000 mg | ORAL_TABLET | Freq: Every day | ORAL | Status: DC
  Administered 2016-02-07 – 2016-02-08 (×2): 7.5 mg via ORAL
  Filled 2016-02-07 (×2): qty 1

## 2016-02-07 MED ORDER — THIAMINE HCL 100 MG/ML IJ SOLN: 100.0000 mg | Freq: Every day | INTRAMUSCULAR | Status: DC

## 2016-02-07 MED ORDER — LORAZEPAM 2 MG/ML IJ SOLN
0.0000 mg | Freq: Two times a day (BID) | INTRAMUSCULAR | Status: DC
Start: 2016-02-09 — End: 2016-02-08

## 2016-02-07 MED ORDER — LORAZEPAM 2 MG/ML IJ SOLN
0.0000 mg | Freq: Four times a day (QID) | INTRAMUSCULAR | Status: DC
  Administered 2016-02-07 – 2016-02-08 (×6): 2 mg via INTRAVENOUS
  Filled 2016-02-07 (×4): qty 1
  Filled 2016-02-07: qty 2
  Filled 2016-02-07: qty 1

## 2016-02-07 MED ORDER — SODIUM CHLORIDE 0.9 % IV BOLUS (SEPSIS)
1000.0000 mL | Freq: Once | INTRAVENOUS | Status: AC
  Administered 2016-02-07: 1000 mL via INTRAVENOUS

## 2016-02-07 MED ORDER — METOPROLOL TARTRATE 50 MG PO TABS
50.0000 mg | ORAL_TABLET | Freq: Two times a day (BID) | ORAL | Status: DC
  Administered 2016-02-07 – 2016-02-09 (×5): 50 mg via ORAL
  Filled 2016-02-07 (×5): qty 1

## 2016-02-07 MED ORDER — ENOXAPARIN SODIUM 40 MG/0.4ML ~~LOC~~ SOLN
40.0000 mg | SUBCUTANEOUS | Status: DC
  Administered 2016-02-07 – 2016-02-08 (×2): 40 mg via SUBCUTANEOUS
  Filled 2016-02-07 (×2): qty 0.4

## 2016-02-07 MED ORDER — FOLIC ACID 1 MG PO TABS
1.0000 mg | ORAL_TABLET | Freq: Every day | ORAL | Status: DC
  Administered 2016-02-07 – 2016-02-09 (×3): 1 mg via ORAL
  Filled 2016-02-07 (×3): qty 1

## 2016-02-07 MED ORDER — LORAZEPAM 2 MG/ML IJ SOLN: 1.0000 mg | Freq: Four times a day (QID) | INTRAMUSCULAR | Status: DC | PRN

## 2016-02-07 MED ORDER — LORAZEPAM 2 MG/ML IJ SOLN: 0.0000 mg | Freq: Four times a day (QID) | INTRAMUSCULAR | Status: DC

## 2016-02-07 MED ORDER — ENSURE ENLIVE PO LIQD
237.0000 mL | Freq: Two times a day (BID) | ORAL | Status: DC
  Administered 2016-02-07 – 2016-02-09 (×6): 237 mL via ORAL

## 2016-02-07 MED ORDER — ONDANSETRON HCL 4 MG PO TABS: 4.0000 mg | ORAL_TABLET | Freq: Four times a day (QID) | ORAL | Status: DC | PRN

## 2016-02-07 MED ORDER — VITAMIN B-1 100 MG PO TABS: 100.0000 mg | ORAL_TABLET | Freq: Every day | ORAL | Status: DC

## 2016-02-07 MED ORDER — ALBUTEROL SULFATE (2.5 MG/3ML) 0.083% IN NEBU: 2.5000 mg | INHALATION_SOLUTION | RESPIRATORY_TRACT | Status: DC | PRN

## 2016-02-07 MED ORDER — TIOTROPIUM BROMIDE MONOHYDRATE 18 MCG IN CAPS
18.0000 ug | ORAL_CAPSULE | Freq: Every day | RESPIRATORY_TRACT | Status: DC
  Administered 2016-02-07 – 2016-02-09 (×3): 18 ug via RESPIRATORY_TRACT
  Filled 2016-02-07 (×2): qty 5

## 2016-02-07 MED ORDER — LEVOTHYROXINE SODIUM 50 MCG PO TABS
50.0000 ug | ORAL_TABLET | Freq: Every day | ORAL | Status: DC
  Administered 2016-02-07 – 2016-02-09 (×3): 50 ug via ORAL
  Filled 2016-02-07 (×3): qty 1

## 2016-02-07 MED ORDER — LORAZEPAM 1 MG PO TABS
1.0000 mg | ORAL_TABLET | Freq: Four times a day (QID) | ORAL | Status: DC | PRN
  Administered 2016-02-07: 14:00:00 1 mg via ORAL
  Filled 2016-02-07: qty 2
  Filled 2016-02-07: qty 1

## 2016-02-07 MED ORDER — ADULT MULTIVITAMIN W/MINERALS CH
1.0000 | ORAL_TABLET | Freq: Every day | ORAL | Status: DC
  Administered 2016-02-07 – 2016-02-09 (×3): 1 via ORAL
  Filled 2016-02-07 (×3): qty 1

## 2016-02-07 MED ORDER — MORPHINE SULFATE (PF) 2 MG/ML IV SOLN: 2.0000 mg | INTRAVENOUS | Status: DC | PRN

## 2016-02-07 MED ORDER — ACETAMINOPHEN 325 MG PO TABS: 650.0000 mg | ORAL_TABLET | Freq: Four times a day (QID) | ORAL | Status: DC | PRN

## 2016-02-07 MED ORDER — ACETAMINOPHEN 650 MG RE SUPP
650.0000 mg | Freq: Four times a day (QID) | RECTAL | Status: DC | PRN
Start: 2016-02-07 — End: 2016-02-09

## 2016-02-07 NOTE — Care Management Important Message (Signed)
Important Message  Patient Details  Name: Thomas [name] MRN: [id number] Date of Birth: [birth year]   Medicare Important Message Given:  Yes    Gizell Danser A, RN 02/07/2016, 7:16 AM

## 2016-02-07 NOTE — Care Management Note (Signed)
Case Management Note  Patient Details  Name: Thomas [name] MRN: [id number] Date of Birth: 05-22-1951  Subjective/Objective:    Thomas [name] was seen in consult by Dr Bary Leriche today and his IVC and sitter were discontinued. He declined an offer of substance abuse rehab. Because he is still on CIWA protocol, he is being moved closer to the nurses station for better observation for safety.                 Action/Plan:   Expected Discharge Date:                  Expected Discharge Plan:     In-House Referral:     Discharge planning Services     Post Acute Care Choice:    Choice offered to:     DME Arranged:    DME Agency:     HH Arranged:    HH Agency:     Status of Service:     If discussed at [facility] of Stay Meetings, dates discussed:    Additional Comments:  Keilyn Nadal A, RN 02/07/2016, 3:09 PM

## 2016-02-07 NOTE — Consult Note (Signed)
[facility] Consult   Reason for Consult:  Involuntary commitment. Referring Physician: Dr. Benjie Karvonen  Patient Identification: Thomas [name] MRN:  [id number] Principal Diagnosis: Alcohol use disorder, severe, dependence ([place]) Diagnosis:   Patient Active Problem List   Diagnosis Date Noted  . Protein-calorie malnutrition, severe [E43] 01/22/2016  . Dehydration [E86.0] 08/17/2015  . Seizure ([place]) [R56.9] 12/12/2014  . Alcohol use disorder, severe, dependence ([place]) [F10.20] 12/12/2014  . Hyponatremia [E87.1] 12/12/2014  . Essential hypertension [I10] 12/12/2014  . COPD (chronic obstructive pulmonary disease) ([place]) [J44.9] 12/12/2014    Total Time spent with patient: 30 minutes  Subjective:    Identifying data. Thomas [name] is a 65 y.o. male with a history of alcoholism.  Chief complaint. "I'm fine."  History of present illness. Information was obtained from the patient and the chart. The patient has a long history of drinking. He has been unable to stop in spite of medical consequences. This is his reported admission for hyponatremia in the context of beer drinking. He was brought to the hospital by his drinking buddy for feeling unwell. The patient reportedly has been consuming 4-540 ounce beers daily. Blood alcohol level was 145. His sodium level was 111 and the patient was admitted to medical floor. He was initially disoriented and agitated and involuntary inpatient psychiatric commitment was initiated. Today the patient is cool and collected. He denies any symptoms of depression. He denies any thoughts of hurting himself or others. He denies psychotic symptoms or symptoms suggestive of bipolar mania. He denies other than alcohol substance use. He is not interested in drinking cessation.  Past psychiatric history. He reports that in the past he was treated with clonazepam. He was never hospitalized for mental illness, no suicide attempts. He denies ever attempting substance  abuse treatment. There are no periods of sobriety to speak of.  Family psychiatric history. Unknown.  Social history. But the patient lives here in there. He has a daughter in [place]. He will go back "where he came from". Disabled. He has health insurance. Risk to Self: Is patient at risk for suicide?: No Risk to Others:   Prior Inpatient Therapy:   Prior Outpatient Therapy:    Past Medical History:  Past Medical History  Diagnosis Date  . Hypertension   . 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 Enlarged prostate   . Tongue cancer ([place])   . Renal disorder   . Lung infection     no lung cancer  . Radiation     to neck  . Status post chemotherapy   . Skin cancer     Past Surgical History  Procedure Laterality Date  . Cancerous lymph nodes removed from neck    . Knee surgery Right   . Skin biopsy     Family History:  Family History  Problem Relation Age of Onset  . Hypertension Mother    Social History:  History  Alcohol Use  . Yes    Comment: 40oz x4 daily     History  Drug Use No    Social History   Social History  . Marital Status: Divorced    Spouse Name: N/A  . Number of Children: N/A  . Years of Education: N/A   Social History Main Topics  . Smoking status: Heavy Tobacco Smoker    Types: Cigarettes  . Smokeless tobacco: None  . Alcohol Use: Yes     Comment: 40oz x4 daily  . Drug Use: No  . Sexual  Activity: Not Asked   Other Topics Concern  . None   Social History Narrative   Additional Social History:    Allergies:  No Known Allergies  Labs:  Results for orders placed or performed during the hospital encounter of 02/06/16 (from the past 48 hour(s))  Ethanol     Status: Abnormal   Collection Time: 02/05/16 11:34 PM  Result Value Ref Range   Alcohol, Ethyl (B) 184 (H) <5 mg/dL    Comment:        LOWEST DETECTABLE LIMIT FOR SERUM ALCOHOL IS 5 mg/dL FOR MEDICAL PURPOSES ONLY   Lipase, blood     Status: None   Collection Time: 02/05/16  11:34 PM  Result Value Ref Range   Lipase 41 11 - 51 U/L  CBC     Status: Abnormal   Collection Time: 02/06/16 11:34 PM  Result Value Ref Range   WBC 4.7 3.8 - 10.6 K/uL   RBC 3.71 (L) 4.40 - 5.90 MIL/uL   Hemoglobin 11.8 (L) 13.0 - 18.0 g/dL   HCT 32.3 (L) 40.0 - 52.0 %   MCV 86.9 80.0 - 100.0 fL   MCH 31.7 26.0 - 34.0 pg   MCHC 36.4 (H) 32.0 - 36.0 g/dL   RDW 17.3 (H) 11.5 - 14.5 %   Platelets 335 150 - 440 K/uL  Comprehensive metabolic panel     Status: Abnormal   Collection Time: 02/06/16 11:34 PM  Result Value Ref Range   Sodium 111 (LL) 135 - 145 mmol/L    Comment: CRITICAL RESULT CALLED TO, READ BACK BY AND VERIFIED WITH LEIGH FURGURSON AT 0011 02/07/16.PMH   Potassium 3.7 3.5 - 5.1 mmol/L   Chloride 76 (L) 101 - 111 mmol/L   CO2 25 22 - 32 mmol/L   Glucose, Bld 84 65 - 99 mg/dL   BUN 5 (L) 6 - 20 mg/dL   Creatinine, Ser 1.01 0.61 - 1.24 mg/dL   Calcium 8.5 (L) 8.9 - 10.3 mg/dL   Total Protein 7.4 6.5 - 8.1 g/dL   Albumin 4.3 3.5 - 5.0 g/dL   AST 26 15 - 41 U/L   ALT 14 (L) 17 - 63 U/L   Alkaline Phosphatase 116 38 - 126 U/L   Total Bilirubin 0.5 0.3 - 1.2 mg/dL   GFR calc non Af Amer >60 >60 mL/min   GFR calc Af Amer >60 >60 mL/min    Comment: (NOTE) The eGFR has been calculated using the CKD EPI equation. This calculation has not been validated in all clinical situations. eGFR's persistently <60 mL/min signify possible Chronic Kidney Disease.    Anion gap 10 5 - 15  Ammonia     Status: None   Collection Time: 02/06/16 11:34 PM  Result Value Ref Range   Ammonia 11 9 - 35 umol/L  TSH     Status: Abnormal   Collection Time: 02/06/16 11:34 PM  Result Value Ref Range   TSH 19.265 (H) 0.350 - 4.500 uIU/mL  Hemoglobin A1c     Status: None   Collection Time: 02/06/16 11:34 PM  Result Value Ref Range   Hgb A1c MFr Bld 5.3 4.0 - 6.0 %    Current Facility-Administered Medications  Medication Dose Route Frequency Provider Last Rate Last Dose  . 0.9 %  sodium  chloride infusion   Intravenous Continuous Harrie Foreman, MD 100 mL/hr at 02/07/16 [phone]    . acetaminophen (TYLENOL) tablet 650 mg  650 mg Oral Q6H PRN Harrie Foreman,  MD       Or  . acetaminophen (TYLENOL) suppository 650 mg  650 mg Rectal Q6H PRN Harrie Foreman, MD      . albuterol (PROVENTIL) (2.5 MG/3ML) 0.083% nebulizer solution 2.5 mg  2.5 mg Nebulization Q4H PRN Harrie Foreman, MD      . docusate sodium (COLACE) capsule 100 mg  100 mg Oral BID Harrie Foreman, MD   100 mg at 02/07/16 1040  . enoxaparin (LOVENOX) injection 40 mg  40 mg Subcutaneous Q24H Harrie Foreman, MD      . feeding supplement (ENSURE ENLIVE) (ENSURE ENLIVE) liquid 237 mL  237 mL Oral BID BM Fritzi Mandes, MD      . folic acid (FOLVITE) tablet 1 mg  1 mg Oral Daily Harrie Foreman, MD   1 mg at 02/07/16 1041  . levothyroxine (SYNTHROID, LEVOTHROID) tablet 50 mcg  50 mcg Oral Q0600 Harrie Foreman, MD   50 mcg at 02/07/16 0708  . LORazepam (ATIVAN) injection 0-4 mg  0-4 mg Intravenous Q6H Harrie Foreman, MD   2 mg at 02/07/16 1332   Followed by  . [START ON 02/09/2016] LORazepam (ATIVAN) injection 0-4 mg  0-4 mg Intravenous Q12H Harrie Foreman, MD      . LORazepam (ATIVAN) tablet 1 mg  1 mg Oral Q6H PRN Harrie Foreman, MD   1 mg at 02/07/16 1333   Or  . LORazepam (ATIVAN) injection 1 mg  1 mg Intravenous Q6H PRN Harrie Foreman, MD      . metoprolol (LOPRESSOR) tablet 50 mg  50 mg Oral BID Harrie Foreman, MD   50 mg at 02/07/16 1040  . mirtazapine (REMERON) tablet 7.5 mg  7.5 mg Oral QHS Harrie Foreman, MD      . mometasone-formoterol [facility]) 200-5 MCG/ACT inhaler 2 puff  2 puff Inhalation BID Harrie Foreman, MD   2 puff at 02/07/16 0708  . multivitamin with minerals tablet 1 tablet  1 tablet Oral Daily Harrie Foreman, MD   1 tablet at 02/07/16 1040  . ondansetron (ZOFRAN) tablet 4 mg  4 mg Oral Q6H PRN Harrie Foreman, MD       Or  . ondansetron [facility]) injection 4 mg  4 mg  Intravenous Q6H PRN Harrie Foreman, MD      . thiamine (VITAMIN B-1) tablet 100 mg  100 mg Oral Daily Harrie Foreman, MD   100 mg at 02/07/16 1040   Or  . thiamine (B-1) injection 100 mg  100 mg Intravenous Daily Harrie Foreman, MD      . tiotropium [facility]) inhalation capsule 18 mcg  18 mcg Inhalation Daily Harrie Foreman, MD   18 mcg at 02/07/16 1044    Musculoskeletal: Strength & Muscle Tone: decreased Gait & Station: Unable to assess Patient leans: N/A  Psychiatric Specialty Exam: I reviewed physical exam performed by medicine attending and agree with the findings. Physical Exam  Nursing note and vitals reviewed.   Review of Systems  Psychiatric/Behavioral: Positive for substance abuse.  All other systems reviewed and are negative.   Blood pressure 135/78, pulse 75, temperature 97.9 F (36.6 C), temperature source Oral, resp. rate 20, height 5' 7"  (1.702 m), weight 57.323 kg (126 lb 6 oz), SpO2 100 %.Body mass index is 19.79 kg/(m^2).  General Appearance: Disheveled  Eye Contact:  Good  Speech:  Slow  Volume:  Normal  Mood:  Euthymic  Affect:  Blunt  Thought Process:  Goal Directed  Orientation:  Full (Time, Place, and Person)  Thought Content:  WDL  Suicidal Thoughts:  No  Homicidal Thoughts:  No  Memory:  Immediate;   Fair Recent;   Fair Remote;   Fair  Judgement:  Poor  Insight:  Lacking  Psychomotor Activity:  Decreased  Concentration:  Concentration: Fair and Attention Span: Fair  Recall:  [employer] of Knowledge:  Fair  Language:  Fair  Akathisia:  No  Handed:  Right  AIMS (if indicated):     Assets:  Communication Skills Desire for Improvement Resilience Social Support  ADL's:  Intact  Cognition:  WNL  Sleep:        Treatment Plan Summary: Daily contact with patient to assess and evaluate symptoms and progress in treatment and Medication management   PLAN: 1. The patient does not meet criteria for involuntary inpatient psychiatric  commitment. I will terminate proceedings. His discharge is appropriate.  2.  Please continue CIWA.  3. I will discontinue sitter.   4. The patient not interested in substance abuse treatment. I will sign off.   Disposition: Patient does not meet criteria for psychiatric inpatient admission. Supportive therapy provided about ongoing stressors. Discussed crisis plan, support from social network, calling 911, coming to the Emergency Department, and calling Suicide Hotline.  Orson Slick, MD 02/07/2016 2:43 PM

## 2016-02-07 NOTE — ED Provider Notes (Signed)
[facility] Emergency Department Provider Note   ____________________________________________  Time seen: Approximately 12:21 AM  I have reviewed the triage vital signs and the nursing notes.   HISTORY  Chief Complaint Anorexia  Limited by patient is a poor historian  HPI Thomas [name] is a 65 y.o. male brought to the ED from a friend's house via EMS with a chief complaint of anorexia.Patient is an alcoholic who drinks approximately 5-6 40 ounce beers daily. Brought with the chief complaint of decreased appetite, nonproductive cough and "not well". States he has occasional episodes of vomiting but denies coffee ground emesis or bloody stools. Denies recent fever, chills, chest pain, shortness of breath, abdominal pain, diarrhea. Denies recent travel or trauma. Nothing makes his symptoms better or worse.   Past Medical History  Diagnosis Date  . Hypertension   . 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 Enlarged prostate   . Tongue cancer ([place])   . Renal disorder   . Lung infection     no lung cancer  . Radiation     to neck  . Status post chemotherapy   . Skin cancer     Patient Active Problem List   Diagnosis Date Noted  . Protein-calorie malnutrition, severe 01/22/2016  . Dehydration 08/17/2015  . Seizure ([place]) 12/12/2014  . Alcohol abuse 12/12/2014  . Hyponatremia 12/12/2014  . Essential hypertension 12/12/2014  . COPD (chronic obstructive pulmonary disease) ([place]) 12/12/2014    Past Surgical History  Procedure Laterality Date  . Cancerous lymph nodes removed from neck    . Knee surgery Right   . Skin biopsy      Current Outpatient Rx  Name  Route  Sig  Dispense  Refill  . Fluticasone-Salmeterol (ADVAIR DISKUS) 250-50 MCG/DOSE AEPB   Inhalation   Inhale 1 puff into the lungs 2 (two) times daily.   60 each   1   . levofloxacin (LEVAQUIN) 500 MG tablet   Oral   Take 1 tablet (500 mg total) by mouth daily. X 6 more days   6 tablet  0   . levothyroxine (SYNTHROID, LEVOTHROID) 50 MCG tablet   Oral   Take 1 tablet (50 mcg total) by mouth daily before breakfast.   30 tablet   2   . metoprolol (LOPRESSOR) 50 MG tablet   Oral   Take 1 tablet (50 mg total) by mouth 2 (two) times daily.   60 tablet   2   . mirtazapine (REMERON) 7.5 MG tablet   Oral   Take 1 tablet (7.5 mg total) by mouth at bedtime.   30 tablet   2   . Multiple Vitamin (MULTIVITAMIN WITH MINERALS) TABS tablet   Oral   Take 1 tablet by mouth daily. Patient not taking: Reported on 01/22/2016   30 tablet   0   . tiotropium (SPIRIVA) 18 MCG inhalation capsule   Inhalation   Place 1 capsule (18 mcg total) into inhaler and inhale daily.   30 capsule   2     Allergies Review of patient's allergies indicates no known allergies.  Family History  Problem Relation Age of Onset  . Hypertension Mother     Social History Social History  Substance Use Topics  . Smoking status: Heavy Tobacco Smoker    Types: Cigarettes  . Smokeless tobacco: None  . Alcohol Use: Yes     Comment: 40oz x4 daily    Review of Systems  Constitutional: Positive for anorexia  and generalized weakness. No fever/chills. Eyes: No visual changes. ENT: No sore throat. Cardiovascular: Denies chest pain. Respiratory: Denies shortness of breath. Gastrointestinal: No abdominal pain.  No nausea, no vomiting.  No diarrhea.  No constipation. Genitourinary: Negative for dysuria. Musculoskeletal: Negative for back pain. Skin: Negative for rash. Neurological: Negative for headaches, focal weakness or numbness.  10-point ROS otherwise negative.  ____________________________________________   PHYSICAL EXAM:  VITAL SIGNS: ED Triage Vitals  Enc Vitals Group     BP 02/06/16 2331 130/84 mmHg     Pulse Rate 02/06/16 2331 74     Resp 02/06/16 2331 20     Temp 02/06/16 2331 97.1 F (36.2 C)     Temp Source 02/06/16 2331 Oral     SpO2 02/06/16 2331 99 %     Weight  02/06/16 2331 131 lb 7 oz (59.62 kg)     Height 02/06/16 2331 5\' 10"  (1.778 m)     Head Cir --      Peak Flow --      Pain Score 02/06/16 2332 0     Pain Loc --      Pain Edu? --      Excl. in [place]? --     Constitutional: Asleep, awakened for exam. Alert and oriented. Disheveled and in no acute distress. Eyes: Conjunctivae are normal. PERRL. EOMI. Head: Atraumatic. Nose: No congestion/rhinnorhea. Mouth/Throat: Mucous membranes are moist.  Oropharynx non-erythematous. Neck: No stridor.  No cervical spine tenderness to palpation. Cardiovascular: Normal rate, regular rhythm. Grossly normal heart sounds.  Good peripheral circulation. Respiratory: Normal respiratory effort.  No retractions. Lungs with scattered rhonchi. Gastrointestinal: Soft and nontender. No distention. No abdominal bruits. No CVA tenderness. Musculoskeletal: No lower extremity tenderness nor edema.  No joint effusions. Neurologic:  Mumbling speech and language. No gross focal neurologic deficits are appreciated. Skin:  Skin is warm, dry and intact. No rash noted. Psychiatric: Mood and affect are normal. Speech and behavior are normal.  ____________________________________________   LABS (all labs ordered are listed, but only abnormal results are displayed)  Labs Reviewed  CBC - Abnormal; Notable for the following:    RBC 3.71 (*)    Hemoglobin 11.8 (*)    HCT 32.3 (*)    MCHC 36.4 (*)    RDW 17.3 (*)    All other components within normal limits  COMPREHENSIVE METABOLIC PANEL - Abnormal; Notable for the following:    Sodium 111 (*)    Chloride 76 (*)    BUN 5 (*)    Calcium 8.5 (*)    ALT 14 (*)    All other components within normal limits  ETHANOL - Abnormal; Notable for the following:    Alcohol, Ethyl (B) 184 (*)    All other components within normal limits  LIPASE, BLOOD  AMMONIA   ____________________________________________  EKG  ED ECG REPORT I, Landen Breeland J, the attending physician, personally  viewed and interpreted this ECG.   Date: 02/07/2016  EKG Time: 0030  Rate: 74  Rhythm: normal EKG, normal sinus rhythm  Axis: Normal  Intervals:right bundle branch block  ST&T Change: Nonspecific  ____________________________________________  RADIOLOGY  Chest 1 view (viewed by me, interpreted per Dr. Radene Knee): No acute cardiopulmonary process seen. ____________________________________________   PROCEDURES  Procedure(s) performed: None  Critical Care performed: No  ____________________________________________   INITIAL IMPRESSION / ASSESSMENT AND PLAN / ED COURSE  Pertinent labs & imaging results that were available during my care of the patient were reviewed by me and  considered in my medical decision making (see chart for details).  65 year old male who is a recalcitrant alcoholic who returns to the ED for anorexia and generalized weakness. Sodium found to be 111. Patient placed on the CIWA protocol, IV fluid resuscitation initiated. Discussed with hospitalist to evaluate patient in the emergency department for admission.  ----------------------------------------- 1:34 AM on 02/07/2016 -----------------------------------------  Patient ripped his IV out and is threatening to leave the premises. States "y'all have done nothing for me". Advised patient that he has a life-threatening and critically low sodium level that may potentiate seizure and ultimately death. Patient is intoxicated with unsteady gait. Does not give a reason for wanting to leave. Comforts (food, blankets) offered to patient. Will place patient under involuntary commitment for his safety as he is intoxicated and demonstrating poor judgment by desiring to leave. ____________________________________________   FINAL CLINICAL IMPRESSION(S) / ED DIAGNOSES  Final diagnoses:  Alcohol abuse  Protein-calorie malnutrition, severe  Hyponatremia  Chronic obstructive pulmonary disease, unspecified COPD type ([place])        NEW MEDICATIONS STARTED DURING THIS VISIT:  New Prescriptions   No medications on file     Note:  This document was prepared using Dragon voice recognition software and may include unintentional dictation errors.    Paulette Blanch, MD 02/07/16 [phone]

## 2016-02-07 NOTE — Progress Notes (Signed)
Initial Nutrition Assessment  DOCUMENTATION CODES:   Severe malnutrition in context of social or environmental circumstances  INTERVENTION:   Cater to pt preferences on Regular diet order. If needed, will recommend Dysphagia III for ease of chewing.  Recommend Ensure Enlive po BID, each supplement provides 350 kcal and 20 grams of protein   NUTRITION DIAGNOSIS:   Malnutrition related to social / environmental circumstances as evidenced by severe depletion of body fat, severe depletion of muscle mass, energy intake < or equal to 50% for > or equal to 1 month, percent weight loss.  GOAL:   Patient will meet greater than or equal to 90% of their needs  MONITOR:   PO intake, Supplement acceptance, Labs, Weight trends, I & O's  REASON FOR ASSESSMENT:   Malnutrition Screening Tool    ASSESSMENT:   Pt admitted IVC with hyponatremia, currently on CIWA.   Past Medical History  Diagnosis Date  . Hypertension   . 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 Enlarged prostate   . Tongue cancer ([place])   . Renal disorder   . Lung infection     no lung cancer  . Radiation     to neck  . Status post chemotherapy   . Skin cancer     Diet Order:  Diet regular Room service appropriate?: Yes; Fluid consistency:: Thin   Pt ate 75% of breakfast this am including eggs and bacon. Sitter at bedside reports pt drank his liquids very well and seemed to chew his breakfast without difficulty.  Pt reports he has not been eating since last admission two weeks ago but has been only drinking. Pt on last admission had told writer he was drinking and not eaten in 10-14days prior to that admission, secondary to EtOH use.   Medications: Folic acid, Remeron, thiamine, Colace, NS at 174mL/hr Labs: Na 111, BUN 5, Cre WDL, Glucose WDL   Gastrointestinal Profile: Last BM:  02/06/2016   Nutrition-Focused Physical Exam Findings: Nutrition-Focused physical exam completed. Findings are moderate-severe fat  depletion, moderate-severe muscle depletion, and no edema.     Weight Change: Pt with 5.9% weight loss since last admission on 6/21 (<2 weeks ago)   Skin:  Reviewed, no issues   Height:   Ht Readings from Last 1 Encounters:  02/07/16 5\' 7"  (1.702 m)    Weight:   Wt Readings from Last 1 Encounters:  02/07/16 126 lb 6 oz (57.323 kg)   Wt Readings from Last 10 Encounters:  02/07/16 126 lb 6 oz (57.323 kg)  01/26/16 134 lb 9.6 oz (61.054 kg)  08/17/15 136 lb 1.6 oz (61.735 kg)  01/01/15 130 lb (58.968 kg)  12/12/14 122 lb 8 oz (55.566 kg)  06/02/14 121 lb 7 oz (55.084 kg)    BMI:  Body mass index is 19.79 kg/(m^2).  Estimated Nutritional Needs:   Kcal:  1710-1955kcals  Protein:  68-85g protein  Fluid:  >/= 1.8L fluid  EDUCATION NEEDS:   Education needs no appropriate at this time  Dwyane Luo, RD, LDN Pager [phone] Weekend/On-Call Pager [phone]

## 2016-02-07 NOTE — ED Notes (Signed)
Patient up out of bed, dressed stating he is leaving that no one is doing anything for him. MD explained to patient that his sodium was at a critical level and that he needed to stay. IVC papers will be taken out due to critical nature of patient's condition and the fact he can't comprehend the nature of his illness. Patient back in bed. Will place another IV.

## 2016-02-07 NOTE — Progress Notes (Signed)
[place] on CIWA. Required 1 extra dose of po Lorazapam 1 mg. for nerviousness. Has eaten constantly. Very needy.

## 2016-02-07 NOTE — Progress Notes (Signed)
Pt seen and case reviewed D/w Dr Bary Leriche to see pt since he has IVC [place] thru out the day Admits drinkning ETOH. Poor social support.

## 2016-02-07 NOTE — H&P (Signed)
Thomas [name] is an 65 y.o. male.   Chief Complaint: Alcohol abuse HPI: The patient with past medical history of hyponatremia secondary to alcohol abuse presents to the emergency department via EMS after a friend with whom he was drinking stated that he was not acting himself. The patient's speech was slurred and he was somnolent upon this examiner's interview but he denied pain to review his caregivers. Operatory evaluation in the emergency department revealed elevated alcohol as well as worsened hyponatremia from an admission a few days ago area due to inebriation and hyponatremia the emergency department staff called for admission.  Past Medical History  Diagnosis Date  . Hypertension   . 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 Enlarged prostate   . Tongue cancer ([place])   . Renal disorder   . Lung infection     no lung cancer  . Radiation     to neck  . Status post chemotherapy   . Skin cancer     Past Surgical History  Procedure Laterality Date  . Cancerous lymph nodes removed from neck    . Knee surgery Right   . Skin biopsy      Family History  Problem Relation Age of Onset  . Hypertension Mother    Social History:  reports that he has been smoking Cigarettes.  He does not have any smokeless tobacco history on file. He reports that he drinks alcohol. He reports that he does not use illicit drugs.  Allergies: No Known Allergies  Medications Prior to Admission  Medication Sig Dispense Refill  . Fluticasone-Salmeterol (ADVAIR DISKUS) 250-50 MCG/DOSE AEPB Inhale 1 puff into the lungs 2 (two) times daily. (Patient not taking: Reported on 02/07/2016) 60 each 1  . levothyroxine (SYNTHROID, LEVOTHROID) 50 MCG tablet Take 1 tablet (50 mcg total) by mouth daily before breakfast. (Patient not taking: Reported on 02/07/2016) 30 tablet 2  . metoprolol (LOPRESSOR) 50 MG tablet Take 1 tablet (50 mg total) by mouth 2 (two) times daily. (Patient not taking: Reported on 02/07/2016) 60 tablet 2  .  mirtazapine (REMERON) 7.5 MG tablet Take 1 tablet (7.5 mg total) by mouth at bedtime. (Patient not taking: Reported on 02/07/2016) 30 tablet 2  . tiotropium (SPIRIVA) 18 MCG inhalation capsule Place 1 capsule (18 mcg total) into inhaler and inhale daily. (Patient not taking: Reported on 02/07/2016) 30 capsule 2    Results for orders placed or performed during the hospital encounter of 02/06/16 (from the past 48 hour(s))  Ethanol     Status: Abnormal   Collection Time: 02/05/16 11:34 PM  Result Value Ref Range   Alcohol, Ethyl (B) 184 (H) <5 mg/dL    Comment:        LOWEST DETECTABLE LIMIT FOR SERUM ALCOHOL IS 5 mg/dL FOR MEDICAL PURPOSES ONLY   Lipase, blood     Status: None   Collection Time: 02/05/16 11:34 PM  Result Value Ref Range   Lipase 41 11 - 51 U/L  CBC     Status: Abnormal   Collection Time: 02/06/16 11:34 PM  Result Value Ref Range   WBC 4.7 3.8 - 10.6 K/uL   RBC 3.71 (L) 4.40 - 5.90 MIL/uL   Hemoglobin 11.8 (L) 13.0 - 18.0 g/dL   HCT 32.3 (L) 40.0 - 52.0 %   MCV 86.9 80.0 - 100.0 fL   MCH 31.7 26.0 - 34.0 pg   MCHC 36.4 (H) 32.0 - 36.0 g/dL   RDW 17.3 (H) 11.5 - 14.5 %  Platelets 335 150 - 440 K/uL  Comprehensive metabolic panel     Status: Abnormal   Collection Time: 02/06/16 11:34 PM  Result Value Ref Range   Sodium 111 (LL) 135 - 145 mmol/L    Comment: CRITICAL RESULT CALLED TO, READ BACK BY AND VERIFIED WITH LEIGH FURGURSON AT 0011 02/07/16.PMH   Potassium 3.7 3.5 - 5.1 mmol/L   Chloride 76 (L) 101 - 111 mmol/L   CO2 25 22 - 32 mmol/L   Glucose, Bld 84 65 - 99 mg/dL   BUN 5 (L) 6 - 20 mg/dL   Creatinine, Ser 1.01 0.61 - 1.24 mg/dL   Calcium 8.5 (L) 8.9 - 10.3 mg/dL   Total Protein 7.4 6.5 - 8.1 g/dL   Albumin 4.3 3.5 - 5.0 g/dL   AST 26 15 - 41 U/L   ALT 14 (L) 17 - 63 U/L   Alkaline Phosphatase 116 38 - 126 U/L   Total Bilirubin 0.5 0.3 - 1.2 mg/dL   GFR calc non Af Amer >60 >60 mL/min   GFR calc Af Amer >60 >60 mL/min    Comment: (NOTE) The eGFR has  been calculated using the CKD EPI equation. This calculation has not been validated in all clinical situations. eGFR's persistently <60 mL/min signify possible Chronic Kidney Disease.    Anion gap 10 5 - 15  Ammonia     Status: None   Collection Time: 02/06/16 11:34 PM  Result Value Ref Range   Ammonia 11 9 - 35 umol/L  TSH     Status: Abnormal   Collection Time: 02/06/16 11:34 PM  Result Value Ref Range   TSH 19.265 (H) 0.350 - 4.500 uIU/mL   Dg Chest 1 View  02/07/2016  CLINICAL DATA:  Acute onset of nonproductive cough. Initial encounter. EXAM: CHEST 1 VIEW COMPARISON:  Chest radiograph performed 01/24/2016 FINDINGS: The lungs are well-aerated and clear. There is no evidence of focal opacification, pleural effusion or pneumothorax. The cardiomediastinal silhouette is within normal limits. No acute osseous abnormalities are seen. Scattered metallic BBs are seen about the left upper arm. Prominent bowel loops are again seen directly underlying the diaphragm, as previously noted. IMPRESSION: No acute cardiopulmonary process seen. Electronically Signed   By: Garald Balding M.D.   On: 02/07/2016 00:09    Review of Systems  Unable to perform ROS: patient intoxicated  All other systems reviewed and are negative.   Blood pressure 135/78, pulse 75, temperature 97.9 F (36.6 C), temperature source Oral, resp. rate 20, height 5' 7"  (1.702 m), weight 57.323 kg (126 lb 6 oz), SpO2 100 %. Physical Exam  Nursing note and vitals reviewed. Constitutional: He is oriented to person, place, and time. He appears well-developed and well-nourished. No distress.  HENT:  Mouth/Throat: Oropharynx is clear and moist.  Eyes: Conjunctivae and EOM are normal. Pupils are equal, round, and reactive to light. No scleral icterus.  Neck: Normal range of motion. Neck supple. No JVD present. No tracheal deviation present. No thyromegaly present.  Cardiovascular: Normal rate, regular rhythm and normal heart sounds.   Exam reveals no gallop and no friction rub.   No murmur heard. Respiratory: Effort normal and breath sounds normal.  GI: Soft. Bowel sounds are normal. He exhibits no distension. There is no tenderness.  Genitourinary:  Deferred  Musculoskeletal: Normal range of motion. He exhibits no edema.  Lymphadenopathy:    He has no cervical adenopathy.  Neurological: He is alert and oriented to person, place, and time. No cranial  nerve deficit.  Skin: Skin is warm and dry. No rash noted. No erythema.  Psychiatric: He has a normal mood and affect. His behavior is normal. Judgment and thought content normal.     Assessment/Plan This is a 65 year old male admitted for hyponatremia. 1. Hyponatremia: Worse than upon discharge a few days ago. Secondary to alcohol intake. We'll hydrate with intravenous fluid for now while the patient is intoxicated. He may need some fluid restriction for Osmostat recalibration once alcohol level has improved. Recheck sodium every 12 hours 2. Essential hypertension: Controlled; continue metoprolol 3. COPD: Continue Dulera and Spiriva 4. Hypothyroidism: Continue Synthroid 5. Alcohol abuse: CIWA scale; Ativan as needed 6. DVT prophylaxis: Lovenox 7. GI prophylaxis: None as the patient is not critically ill The patient is a full code. Time spent on admission orders and patient care approximately 45 minutes   Harrie Foreman, MD 02/07/2016, 5:56 AM

## 2016-02-08 LAB — BASIC METABOLIC PANEL
Anion gap: 5 (ref 5–15)
BUN: 12 mg/dL (ref 6–20)
CHLORIDE: 97 mmol/L — AB (ref 101–111)
CO2: 27 mmol/L (ref 22–32)
Calcium: 8.5 mg/dL — ABNORMAL LOW (ref 8.9–10.3)
Creatinine, Ser: 1.06 mg/dL (ref 0.61–1.24)
Glucose, Bld: 99 mg/dL (ref 65–99)
POTASSIUM: 3.7 mmol/L (ref 3.5–5.1)
SODIUM: 129 mmol/L — AB (ref 135–145)

## 2016-02-08 MED ORDER — LORAZEPAM 2 MG PO TABS
0.0000 mg | ORAL_TABLET | Freq: Four times a day (QID) | ORAL | Status: AC
  Administered 2016-02-08: 21:00:00 2 mg via SUBLINGUAL
  Filled 2016-02-08: qty 1

## 2016-02-08 MED ORDER — AMLODIPINE BESYLATE 10 MG PO TABS
5.0000 mg | ORAL_TABLET | Freq: Every day | ORAL | Status: DC
  Administered 2016-02-08 – 2016-02-09 (×2): 5 mg via ORAL
  Filled 2016-02-08 (×2): qty 1

## 2016-02-08 MED ORDER — LORAZEPAM 2 MG/ML IJ SOLN: 0.0000 mg | Freq: Two times a day (BID) | INTRAMUSCULAR | Status: DC

## 2016-02-08 MED ORDER — CLONIDINE HCL 0.1 MG PO TABS
0.1000 mg | ORAL_TABLET | Freq: Two times a day (BID) | ORAL | Status: DC
  Administered 2016-02-08 – 2016-02-09 (×3): 0.1 mg via ORAL
  Filled 2016-02-08 (×3): qty 1

## 2016-02-08 MED ORDER — LORAZEPAM 2 MG/ML IJ SOLN: 0.0000 mg | Freq: Four times a day (QID) | INTRAMUSCULAR | Status: DC

## 2016-02-08 MED ORDER — LORAZEPAM 2 MG PO TABS: 0.0000 mg | ORAL_TABLET | Freq: Two times a day (BID) | ORAL | Status: DC

## 2016-02-08 NOTE — Progress Notes (Signed)
[facility] [place] NAME: Thomas [name]    MR#:  [id number]  DATE OF BIRTH:  14-Aug-1950  SUBJECTIVE:   No new complaints. Eating well. REVIEW OF SYSTEMS:   Review of Systems  Constitutional: Positive for malaise/fatigue. Negative for fever, chills and weight loss.  HENT: Negative for ear discharge, ear pain and nosebleeds.   Eyes: Negative for blurred vision, pain and discharge.  Respiratory: Negative for sputum production, shortness of breath, wheezing and stridor.   Cardiovascular: Negative for chest pain, palpitations, orthopnea and PND.  Gastrointestinal: Negative for nausea, vomiting, abdominal pain and diarrhea.  Genitourinary: Negative for urgency and frequency.  Musculoskeletal: Negative for back pain and joint pain.  Neurological: Positive for weakness. Negative for sensory change, speech change and focal weakness.  Psychiatric/Behavioral: Negative for depression and hallucinations. The patient is not nervous/anxious.   All other systems reviewed and are negative.  Tolerating Diet: Yes Tolerating PT: Pending  DRUG ALLERGIES:  No Known Allergies  VITALS:  Blood pressure 169/80, pulse 72, temperature 98.5 F (36.9 C), temperature source Oral, resp. rate 21, height 5\' 7"  (1.702 m), weight 57.607 kg (127 lb), SpO2 95 %.  PHYSICAL EXAMINATION:   Physical Exam  GENERAL:  65 y.o.-year-old patient lying in the bed with no acute distress. Oral poor hygiene EYES: Pupils equal, round, reactive to light and accommodation. No scleral icterus. Extraocular muscles intact.  HEENT: Head atraumatic, normocephalic. Oropharynx and nasopharynx clear.  NECK:  Supple, no jugular venous distention. No thyroid enlargement, no tenderness.  LUNGS: Normal breath sounds bilaterally, no wheezing, rales, rhonchi. No use of accessory muscles of respiration.  CARDIOVASCULAR: S1, S2 normal. No murmurs, rubs, or gallops.  ABDOMEN: Soft, nontender,  nondistended. Bowel sounds present. No organomegaly or mass.  EXTREMITIES: No cyanosis, clubbing or edema b/l.    NEUROLOGIC: Cranial nerves II through XII are intact. No focal Motor or sensory deficits b/l.   PSYCHIATRIC:  patient is alert and oriented x 3.  SKIN: No obvious rash, lesion, or ulcer.   LABORATORY PANEL:  CBC  Recent Labs Lab 02/06/16 2334  WBC 4.7  HGB 11.8*  HCT 32.3*  PLT 335    Chemistries   Recent Labs Lab 02/06/16 2334  02/08/16 0531  NA 111*  < > 129*  K 3.7  < > 3.7  CL 76*  < > 97*  CO2 25  < > 27  GLUCOSE 84  < > 99  BUN 5*  < > 12  CREATININE 1.01  < > 1.06  CALCIUM 8.5*  < > 8.5*  AST 26  --   --   ALT 14*  --   --   ALKPHOS 116  --   --   BILITOT 0.5  --   --   < > = values in this interval not displayed. Cardiac Enzymes No results for input(s): TROPONINI in the last 168 hours. RADIOLOGY:  Dg Chest 1 View  02/07/2016  CLINICAL DATA:  Acute onset of nonproductive cough. Initial encounter. EXAM: CHEST 1 VIEW COMPARISON:  Chest radiograph performed 01/24/2016 FINDINGS: The lungs are well-aerated and clear. There is no evidence of focal opacification, pleural effusion or pneumothorax. The cardiomediastinal silhouette is within normal limits. No acute osseous abnormalities are seen. Scattered metallic BBs are seen about the left upper arm. Prominent bowel loops are again seen directly underlying the diaphragm, as previously noted. IMPRESSION: No acute cardiopulmonary process seen. Electronically Signed   By: Jacqulynn Cadet  Chang M.D.   On: 02/07/2016 00:09   ASSESSMENT AND PLAN:  65 year old male admitted for hyponatremia. 1. Recurrent Hyponatremia: Worse than upon discharge a few days ago. Secondary to alcohol intake. W -Patient hydrated with intravenous fluid for now while the patient is intoxicated.  - sodium now is 129. - serum sodium 111---120--129 We'll stop IV fluids  -Patient advised cessation for alcohol. He does not have the insight for  it.  2. Essential hypertension: Controlled; continue metoprolol, add Klonopin  3. COPD: Continue Dulera and Spiriva  4. Hypothyroidism: Continue Synthroid  5. Alcohol abuse: CIWA scale; Ativan as needed  6. DVT prophylaxis: Lovenox  Social worker/case management for discharge planning.  Physical therapy to see patient.  Case discussed with Care Management/Social Worker. Management plans discussed with the patient, family and they are in agreement.  CODE STATUS: Full  DVT Prophylaxis: Lovenox  TOTAL TIME TAKING CARE OF THIS PATIENT: 30 minutes.  >50% time spent on counselling and coordination of care  POSSIBLE D/C IN one to 2 DAYS, DEPENDING ON CLINICAL CONDITION.  Note: This dictation was prepared with Dragon dictation along with smaller phrase technology. Any transcriptional errors that result from this process are unintentional.  Chimaobi Casebolt M.D on 02/08/2016 at 10:38 AM  Between 7am to 6pm - Pager - [phone]  After 6pm go to [email or web address] - password [email or web address] [facility]  [facility]  Office  [phone]  CC: Primary care physician; [place]

## 2016-02-08 NOTE — Progress Notes (Signed)
Spoke to Dr. Posey Pronto b/c we lost IV access.  She sd that was ok.  Pt is eating/drinking well.  IV fluids were d/ced.  CIWA score is 2.  Changed ativan order for CIWA protocol to sublingual if needed.  He is a possible d/c tomorrow.

## 2016-02-08 NOTE — Progress Notes (Signed)
[facility]  ROUNDING NOTE   Subjective:  Patient recently seen for hyponatremia. Presents now with recurrent hyponatremia. Upon presentation serum sodium was quite low at 111. With conservative management sodium has come up to 129. Patient currently not on IV fluids.   Objective:  Vital signs in last 24 hours:  Temp:  [98.1 F (36.7 C)-98.7 F (37.1 C)] 98.5 F (36.9 C) (07/04 0500) Pulse Rate:  [71-83] 72 (07/04 0846) Resp:  [20-21] 21 (07/04 0500) BP: (153-180)/(70-87) 169/80 mmHg (07/04 0846) SpO2:  [77 %-100 %] 95 % (07/04 0500) Weight:  [57.607 kg (127 lb)-58.06 kg (128 lb)] 57.607 kg (127 lb) (07/04 0500)  Weight change: -1.559 kg (-3 lb 7 oz) Filed Weights   02/07/16 0226 02/07/16 2349 02/08/16 0500  Weight: 57.323 kg (126 lb 6 oz) 58.06 kg (128 lb) 57.607 kg (127 lb)    Intake/Output: I/O last 3 completed shifts: In: 1140 [P.O.:490; Other:650] Out: 2400 [Urine:2400]   Intake/Output this shift:  Total I/O In: 240 [P.O.:240] Out: 300 [Urine:300]  Physical Exam: General: NAD, sitting up in bed  Head: Normocephalic, atraumatic. Moist oral mucosal membranes  Eyes: Anicteric  Neck: Supple, trachea midline  Lungs:  Clear to auscultation  Heart: Regular rate and rhythm  Abdomen:  Soft, nontender, BS present  Extremities: no peripheral edema.  Neurologic: Nonfocal, moving all four extremities  Skin: Rash noted on b/l UE's   Access: none    Basic Metabolic Panel:  Recent Labs Lab 02/06/16 2334 02/07/16 1328 02/08/16 0531  NA 111* 120* 129*  K 3.7 3.9 3.7  CL 76* 88* 97*  CO2 25 25 27   GLUCOSE 84 180* 99  BUN 5* 9 12  CREATININE 1.01 1.15 1.06  CALCIUM 8.5* 8.1* 8.5*    Liver Function Tests:  Recent Labs Lab 02/06/16 2334  AST 26  ALT 14*  ALKPHOS 116  BILITOT 0.5  PROT 7.4  ALBUMIN 4.3    Recent Labs Lab 02/05/16 2334  LIPASE 41    Recent Labs Lab 02/06/16 2334  AMMONIA 11    CBC:  Recent Labs Lab  02/06/16 2334  WBC 4.7  HGB 11.8*  HCT 32.3*  MCV 86.9  PLT 335    Cardiac Enzymes: No results for input(s): CKTOTAL, CKMB, CKMBINDEX, TROPONINI in the last 168 hours.  BNP: Invalid input(s): POCBNP  CBG: No results for input(s): GLUCAP in the last 168 hours.  Microbiology: Results for orders placed or performed during the hospital encounter of 01/22/16  CULTURE, BLOOD (ROUTINE X 2) w Reflex to ID Panel     Status: None   Collection Time: 01/24/16  3:15 PM  Result Value Ref Range Status   Specimen Description BLOOD RIGHT A  Final   Special Requests   Final    BOTTLES DRAWN AEROBIC AND ANAEROBIC AER 10ML ANA 5ML   Culture NO GROWTH 5 DAYS  Final   Report Status 01/29/2016 FINAL  Final  CULTURE, BLOOD (ROUTINE X 2) w Reflex to ID Panel     Status: None   Collection Time: 01/24/16  3:15 PM  Result Value Ref Range Status   Specimen Description BLOOD LEFT A  Final   Special Requests   Final    BOTTLES DRAWN AEROBIC AND ANAEROBIC AER 10ML ANA 9ML   Culture NO GROWTH 5 DAYS  Final   Report Status 01/29/2016 FINAL  Final    Coagulation Studies: No results for input(s): LABPROT, INR in the last 72 hours.  Urinalysis: No results  for input(s): COLORURINE, LABSPEC, [place], GLUCOSEU, HGBUR, BILIRUBINUR, KETONESUR, PROTEINUR, UROBILINOGEN, NITRITE, LEUKOCYTESUR in the last 72 hours.  Invalid input(s): APPERANCEUR    Imaging: Dg Chest 1 View  02/07/2016  CLINICAL DATA:  Acute onset of nonproductive cough. Initial encounter. EXAM: CHEST 1 VIEW COMPARISON:  Chest radiograph performed 01/24/2016 FINDINGS: The lungs are well-aerated and clear. There is no evidence of focal opacification, pleural effusion or pneumothorax. The cardiomediastinal silhouette is within normal limits. No acute osseous abnormalities are seen. Scattered metallic BBs are seen about the left upper arm. Prominent bowel loops are again seen directly underlying the diaphragm, as previously noted. IMPRESSION: No acute  cardiopulmonary process seen. Electronically Signed   By: Garald Balding M.D.   On: 02/07/2016 00:09     Medications:     . cloNIDine  0.1 mg Oral BID  . docusate sodium  100 mg Oral BID  . enoxaparin (LOVENOX) injection  40 mg Subcutaneous Q24H  . feeding supplement (ENSURE ENLIVE)  237 mL Oral BID BM  . folic acid  1 mg Oral Daily  . levothyroxine  50 mcg Oral Q0600  . LORazepam  0-4 mg Intravenous Q6H   Followed by  . [START ON 02/09/2016] LORazepam  0-4 mg Intravenous Q12H  . metoprolol  50 mg Oral BID  . mirtazapine  7.5 mg Oral QHS  . mometasone-formoterol  2 puff Inhalation BID  . multivitamin with minerals  1 tablet Oral Daily  . thiamine  100 mg Oral Daily   Or  . thiamine  100 mg Intravenous Daily  . tiotropium  18 mcg Inhalation Daily   acetaminophen **OR** acetaminophen, albuterol, LORazepam **OR** LORazepam, ondansetron **OR** ondansetron (ZOFRAN) IV  Assessment/ Plan:  65 y.o. 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 Hyponatremia: history suggestive related to EtOH either from beer potomania - Presented similarly several weeks ago, ok to d/c IVFs  Advised pt again to avoid further ETOH use.    2. Hypertension: blood pressure currently 169/80.  Continue clonidine and metoprolol.  Add amlodipine 5mg  po daily as well.  3. Alcohol abuse:  Counselled the pt to abstain from ETOH.  Remains on ativan, thiamine, folic acid.    LOS: 1 Claire Dolores 7/4/20171:00 PM

## 2016-02-08 NOTE — Evaluation (Signed)
Physical Therapy Evaluation Patient Details Name: Thomas [name] MRN: [id number] DOB: [birth year] Today's Date: 02/08/2016   History of Present Illness  Pt is a 65 y.o. male presenting to hospital seeking detox 01/22/16 and admitted for hyponatremia.  PMH includes htn, CA of the nasal cavities and tonge, COPD, and ETOH abuse.  Clinical Impression  Pt is a pleasant 65 year old male who was admitted for hyponatremia secondary to alcohol abuse. Pt demonstrates all bed mobility/transfers/ambulation at baseline level. Pt occasionally reaches for wall support, however does not require RW for mobility at this time. Pt does not require any further PT needs at this time. Pt will be dc in house and does not require follow up. RN aware. Will dc current orders.      Follow Up Recommendations No PT follow up    Equipment Recommendations       Recommendations for Other Services       Precautions / Restrictions Precautions Precautions: Fall Restrictions Weight Bearing Restrictions: No      Mobility  Bed Mobility Overal bed mobility: Independent Bed Mobility: Supine to Sit           General bed mobility comments: safe technique performed. Pt slightly impulsive and needs cues for waiting until therapist ready   Transfers Overall transfer level: Independent Equipment used: None Transfers: Sit to/from Stand           General transfer comment: correct technique performed without AD.  Ambulation/Gait Ambulation/Gait assistance: Min guard Ambulation Distance (Feet): 200 Feet Assistive device: None Gait Pattern/deviations: Step-through pattern;Shuffle Gait velocity: decreased   General Gait Details: decreased B step length with shuffling gait pattern performed. Pt reaching out for railing during hallway, however no LOB noted. Pt required 1 standing rest break.  Stairs            Wheelchair Mobility    Modified Rankin (Stroke Patients Only)       Balance Overall balance  assessment: Needs assistance Sitting-balance support: Feet supported Sitting balance-Leahy Scale: Good     Standing balance support: Single extremity supported Standing balance-Leahy Scale: Good Standing balance comment: 1 hand rail noted for support, however not consistent                             Pertinent Vitals/Pain Pain Assessment: No/denies pain    Home Living Family/patient expects to be discharged to:: Unsure                      Prior Function Level of Independence: Independent         Comments: primarily household ambulation performed     Hand Dominance        Extremity/Trunk Assessment   Upper Extremity Assessment: Overall WFL for tasks assessed           Lower Extremity Assessment: Overall WFL for tasks assessed         Communication   Communication: HOH  Cognition Arousal/Alertness: Awake/alert Behavior During Therapy: WFL for tasks assessed/performed Overall Cognitive Status: Within Functional Limits for tasks assessed                      General Comments      Exercises        Assessment/Plan    PT Assessment Patent does not need any further PT services  PT Diagnosis     PT Problem List    PT Treatment Interventions  PT Goals (Current goals can be found in the Care Plan section) Acute Rehab PT Goals Patient Stated Goal: to go home PT Goal Formulation: All assessment and education complete, DC therapy Time For Goal Achievement: 2016-03-02 Potential to Achieve Goals: Good    Frequency     Barriers to discharge        Co-evaluation               End of Session Equipment Utilized During Treatment: Gait belt Activity Tolerance: Patient tolerated treatment well Patient left: in bed;with bed alarm set Nurse Communication: Mobility status         Time: [id number] PT Time Calculation (min) (ACUTE ONLY): 18 min   Charges:   PT Evaluation $PT Eval Moderate Complexity: 1 Procedure      PT G Codes:        Whitfield Dulay 02-Mar-2016, 3:03 PM  Greggory Stallion, PT, DPT [phone]

## 2016-02-08 NOTE — Clinical Social Work Note (Signed)
CSW consulted for "commitments." Pt was seen and cleared by Psych. IVC was discontinued. PT eval is pending. Full assessment to follow if appropriate. CSW will continue to follow.   Darden Dates, MSW, LCSW  Clinical Social Worker  [phone]

## 2016-02-09 ENCOUNTER — Encounter: Payer: Self-pay | Admitting: *Deleted

## 2016-02-09 ENCOUNTER — Emergency Department: Payer: Medicare Other

## 2016-02-09 ENCOUNTER — Emergency Department
Admission: EM | Admit: 2016-02-09 | Discharge: 2016-02-10 | Disposition: A | Discharge status: Home / Self Care | Payer: Medicare Other | Attending: Emergency Medicine | Admitting: Emergency Medicine

## 2016-02-09 DIAGNOSIS — E871 Hypo-osmolality and hyponatremia: Secondary | ICD-10-CM | POA: Diagnosis not present

## 2016-02-09 DIAGNOSIS — R4781 Slurred speech: Secondary | ICD-10-CM | POA: Insufficient documentation

## 2016-02-09 DIAGNOSIS — Z79899 Other long term (current) drug therapy: Secondary | ICD-10-CM | POA: Insufficient documentation

## 2016-02-09 DIAGNOSIS — Z85828 Personal history of other malignant neoplasm of skin: Secondary | ICD-10-CM | POA: Insufficient documentation

## 2016-02-09 DIAGNOSIS — Z85810 Personal history of malignant neoplasm of tongue: Secondary | ICD-10-CM | POA: Insufficient documentation

## 2016-02-09 DIAGNOSIS — Z8522 Personal history of malignant neoplasm of nasal cavities, middle ear, and accessory sinuses: Secondary | ICD-10-CM | POA: Insufficient documentation

## 2016-02-09 DIAGNOSIS — J449 Chronic obstructive pulmonary disease, unspecified: Secondary | ICD-10-CM | POA: Insufficient documentation

## 2016-02-09 DIAGNOSIS — F17210 Nicotine dependence, cigarettes, uncomplicated: Secondary | ICD-10-CM | POA: Insufficient documentation

## 2016-02-09 DIAGNOSIS — R531 Weakness: Secondary | ICD-10-CM

## 2016-02-09 DIAGNOSIS — I10 Essential (primary) hypertension: Secondary | ICD-10-CM | POA: Insufficient documentation

## 2016-02-09 LAB — URINALYSIS COMPLETE WITH MICROSCOPIC (ARMC ONLY)
BILIRUBIN URINE: NEGATIVE
Bacteria, UA: NONE SEEN
Glucose, UA: NEGATIVE mg/dL
HGB URINE DIPSTICK: NEGATIVE
KETONES UR: NEGATIVE mg/dL
LEUKOCYTES UA: NEGATIVE
NITRITE: NEGATIVE
PH: 6 (ref 5.0–8.0)
Protein, ur: NEGATIVE mg/dL
SPECIFIC GRAVITY, URINE: 1.009 (ref 1.005–1.030)
Squamous Epithelial / LPF: NONE SEEN

## 2016-02-09 LAB — COMPREHENSIVE METABOLIC PANEL
ALT: 11 U/L — ABNORMAL LOW (ref 17–63)
AST: 23 U/L (ref 15–41)
Albumin: 4 g/dL (ref 3.5–5.0)
Alkaline Phosphatase: 88 U/L (ref 38–126)
Anion gap: 10 (ref 5–15)
BUN: 19 mg/dL (ref 6–20)
CHLORIDE: 92 mmol/L — AB (ref 101–111)
CO2: 27 mmol/L (ref 22–32)
Calcium: 9.4 mg/dL (ref 8.9–10.3)
Creatinine, Ser: 1.29 mg/dL — ABNORMAL HIGH (ref 0.61–1.24)
GFR, EST NON AFRICAN AMERICAN: 57 mL/min — AB (ref >60)
Glucose, Bld: 98 mg/dL (ref 65–99)
POTASSIUM: 3.7 mmol/L (ref 3.5–5.1)
SODIUM: 129 mmol/L — AB (ref 135–145)
Total Bilirubin: 0.3 mg/dL (ref 0.3–1.2)
Total Protein: 7.1 g/dL (ref 6.5–8.1)

## 2016-02-09 LAB — BASIC METABOLIC PANEL
ANION GAP: 7 (ref 5–15)
BUN: 14 mg/dL (ref 6–20)
CALCIUM: 8.5 mg/dL — AB (ref 8.9–10.3)
CO2: 26 mmol/L (ref 22–32)
Chloride: 97 mmol/L — ABNORMAL LOW (ref 101–111)
Creatinine, Ser: 1.17 mg/dL (ref 0.61–1.24)
GFR calc Af Amer: >60 mL/min (ref >60)
GLUCOSE: 121 mg/dL — AB (ref 65–99)
Potassium: 3.5 mmol/L (ref 3.5–5.1)
SODIUM: 130 mmol/L — AB (ref 135–145)

## 2016-02-09 LAB — CBC WITH DIFFERENTIAL/PLATELET
BASOS PCT: 1 %
Basophils Absolute: 0.1 10*3/uL (ref 0–0.1)
EOS ABS: 0.1 10*3/uL (ref 0–0.7)
Eosinophils Relative: 2 %
HEMATOCRIT: 32.6 % — AB (ref 40.0–52.0)
HEMOGLOBIN: 11.2 g/dL — AB (ref 13.0–18.0)
LYMPHS ABS: 0.9 10*3/uL — AB (ref 1.0–3.6)
Lymphocytes Relative: 12 %
MCH: 31.2 pg (ref 26.0–34.0)
MCHC: 34.3 g/dL (ref 32.0–36.0)
MCV: 91.1 fL (ref 80.0–100.0)
MONOS PCT: 8 %
Monocytes Absolute: 0.6 10*3/uL (ref 0.2–1.0)
NEUTROS ABS: 6 10*3/uL (ref 1.4–6.5)
NEUTROS PCT: 77 %
Platelets: 262 10*3/uL (ref 150–440)
RBC: 3.58 MIL/uL — AB (ref 4.40–5.90)
RDW: 18 % — ABNORMAL HIGH (ref 11.5–14.5)
WBC: 7.7 10*3/uL (ref 3.8–10.6)

## 2016-02-09 LAB — URINE DRUG SCREEN, QUALITATIVE (ARMC ONLY)
AMPHETAMINES, UR SCREEN: NOT DETECTED
Barbiturates, Ur Screen: NOT DETECTED
Benzodiazepine, Ur Scrn: POSITIVE — AB
CANNABINOID 50 NG, UR ~~LOC~~: NOT DETECTED
Cocaine Metabolite,Ur ~~LOC~~: NOT DETECTED
MDMA (Ecstasy)Ur Screen: NOT DETECTED
Methadone Scn, Ur: NOT DETECTED
OPIATE, UR SCREEN: NOT DETECTED
PHENCYCLIDINE (PCP) UR S: NOT DETECTED
Tricyclic, Ur Screen: NOT DETECTED

## 2016-02-09 LAB — MAGNESIUM: MAGNESIUM: 1.8 mg/dL (ref 1.7–2.4)

## 2016-02-09 LAB — LIPASE, BLOOD: Lipase: 72 U/L — ABNORMAL HIGH (ref 11–51)

## 2016-02-09 LAB — ETHANOL

## 2016-02-09 MED ORDER — MIRTAZAPINE 7.5 MG PO TABS: 7.5000 mg | ORAL_TABLET | Freq: Every day | ORAL | Status: DC

## 2016-02-09 MED ORDER — SODIUM CHLORIDE 0.9 % IV BOLUS (SEPSIS)
1000.0000 mL | Freq: Once | INTRAVENOUS | Status: AC
  Administered 2016-02-09: 1000 mL via INTRAVENOUS

## 2016-02-09 MED ORDER — LORAZEPAM 2 MG/ML IJ SOLN
2.0000 mg | Freq: Once | INTRAMUSCULAR | Status: AC
  Administered 2016-02-09: 2 mg via INTRAVENOUS
  Filled 2016-02-09: qty 1

## 2016-02-09 MED ORDER — ADULT MULTIVITAMIN W/MINERALS CH: 1.0000 | ORAL_TABLET | Freq: Every day | ORAL | Status: DC

## 2016-02-09 MED ORDER — LEVOTHYROXINE SODIUM 50 MCG PO TABS: 50.0000 ug | ORAL_TABLET | Freq: Every day | ORAL | Status: DC

## 2016-02-09 MED ORDER — CLONIDINE HCL 0.1 MG PO TABS: 0.1000 mg | ORAL_TABLET | Freq: Two times a day (BID) | ORAL | Status: DC

## 2016-02-09 MED ORDER — AMLODIPINE BESYLATE 5 MG PO TABS: 5.0000 mg | ORAL_TABLET | Freq: Every day | ORAL | Status: DC

## 2016-02-09 MED ORDER — METOPROLOL TARTRATE 50 MG PO TABS: 50.0000 mg | ORAL_TABLET | Freq: Two times a day (BID) | ORAL | Status: DC

## 2016-02-09 MED ORDER — THIAMINE HCL 100 MG/ML IJ SOLN
100.0000 mg | Freq: Once | INTRAMUSCULAR | Status: AC
  Administered 2016-02-09: 100 mg via INTRAVENOUS
  Filled 2016-02-09: qty 2

## 2016-02-09 MED ORDER — SODIUM CHLORIDE 0.9 % IV SOLN
1.0000 mg | Freq: Once | INTRAVENOUS | Status: AC
  Administered 2016-02-09: 1 mg via INTRAVENOUS
  Filled 2016-02-09: qty 0.2

## 2016-02-09 NOTE — Progress Notes (Signed)
Pts CIWA was 1 at midnight.  Pt having slight tremors, but symptoms of withdrawal are very minimal.

## 2016-02-09 NOTE — ED Provider Notes (Addendum)
[facility] Emergency Department Provider Note  ____________________________________________   I have reviewed the triage vital signs and the nursing notes.   HISTORY  Chief Complaint Weakness    HPI Thomas [name] is a 65 y.o. male with a history of alcohol abuse who presents today feeling generally weak. It is raining outside and he is homeless. He states he just does not feel very well. He has no focal complaints.  He is very vague about his symptoms. He states that he just does not feel well. Denies any focal chest pain shortness of breath nausea vomiting or diarrhea. States he was drinking before coming in. Did not fall or hit his head. States he did fall a few days ago he believes. Denies any other complaint.      Past Medical History  Diagnosis Date  . Hypertension   . 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 Enlarged prostate   . Tongue cancer ([place])   . Renal disorder   . Lung infection     no lung cancer  . Radiation     to neck  . Status post chemotherapy   . Skin cancer     Patient Active Problem List   Diagnosis Date Noted  . Protein-calorie malnutrition, severe 01/22/2016  . Dehydration 08/17/2015  . Seizure ([place]) 12/12/2014  . Alcohol use disorder, severe, dependence ([place]) 12/12/2014  . Hyponatremia 12/12/2014  . Essential hypertension 12/12/2014  . COPD (chronic obstructive pulmonary disease) ([place]) 12/12/2014    Past Surgical History  Procedure Laterality Date  . Cancerous lymph nodes removed from neck    . Knee surgery Right   . Skin biopsy      Current Outpatient Rx  Name  Route  Sig  Dispense  Refill  . amLODipine (NORVASC) 5 MG tablet   Oral   Take 1 tablet (5 mg total) by mouth daily.   30 tablet   2   . cloNIDine (CATAPRES) 0.1 MG tablet   Oral   Take 1 tablet (0.1 mg total) by mouth 2 (two) times daily.   60 tablet   2   . Fluticasone-Salmeterol (ADVAIR DISKUS) 250-50 MCG/DOSE AEPB   Inhalation   Inhale 1 puff  into the lungs 2 (two) times daily. Patient not taking: Reported on 02/07/2016   60 each   1   . levothyroxine (SYNTHROID, LEVOTHROID) 50 MCG tablet   Oral   Take 1 tablet (50 mcg total) by mouth daily before breakfast.   30 tablet   2   . metoprolol (LOPRESSOR) 50 MG tablet   Oral   Take 1 tablet (50 mg total) by mouth 2 (two) times daily.   60 tablet   2   . mirtazapine (REMERON) 7.5 MG tablet   Oral   Take 1 tablet (7.5 mg total) by mouth at bedtime.   30 tablet   2   . Multiple Vitamin (MULTIVITAMIN WITH MINERALS) TABS tablet   Oral   Take 1 tablet by mouth daily.   30 tablet   2   . tiotropium (SPIRIVA) 18 MCG inhalation capsule   Inhalation   Place 1 capsule (18 mcg total) into inhaler and inhale daily. Patient not taking: Reported on 02/07/2016   30 capsule   2     Allergies Review of patient's allergies indicates no known allergies.  Family History  Problem Relation Age of Onset  . Hypertension Mother     Social History Social History  Substance Use  Topics  . Smoking status: Heavy Tobacco Smoker    Types: Cigarettes  . Smokeless tobacco: None  . Alcohol Use: Yes     Comment: 40oz x4 daily    Review of Systems Limited review of systems, patient disinclined to answer many questions.   ____________________________________________   PHYSICAL EXAM:  VITAL SIGNS: ED Triage Vitals  Enc Vitals Group     BP 02/09/16 1758 135/85 mmHg     Pulse Rate 02/09/16 1758 81     Resp 02/09/16 1758 18     Temp 02/09/16 1758 98.4 F (36.9 C)     Temp Source 02/09/16 1758 Oral     SpO2 02/09/16 1758 100 %     Weight 02/09/16 1758 130 lb (58.968 kg)     Height 02/09/16 1758 5\' 7"  (1.702 m)     Head Cir --      Peak Flow --      Pain Score 02/09/16 1759 6     Pain Loc --      Pain Edu? --      Excl. in [place]? --     Constitutional: Alert and In no acute distress, has a somewhat mumbling speech. He poorly kempt. Eyes: Conjunctivae are normal. PERRL.  EOMI. Head: Atraumatic. Nose: No congestion/rhinnorhea. Mouth/Throat: Mucous membranes are moist.  Oropharynx non-erythematous. Neck: No stridor.   Nontender with no meningismus Cardiovascular: Normal rate, regular rhythm. Grossly normal heart sounds.  Good peripheral circulation. Respiratory: Normal respiratory effort.  No retractions. Lungs CTAB. Abdominal: Soft and nontender. No distention. No guarding no rebound Back:  There is no focal tenderness or step off there is no midline tenderness there are no lesions noted. there is no CVA tenderness Musculoskeletal: No lower extremity tenderness. No joint effusions, no DVT signs strong distal pulses no edema Neurologic:  Normal speech and language. No gross focal neurologic deficits are appreciated.  Skin:  Skin is warm, dry and intact. No rash noted. Psychiatric: Mood and affect are normal. Speech and behavior are normal.  ____________________________________________   LABS (all labs ordered are listed, but only abnormal results are displayed)  Labs Reviewed  COMPREHENSIVE METABOLIC PANEL - Abnormal; Notable for the following:    Sodium 129 (*)    Chloride 92 (*)    Creatinine, Ser 1.29 (*)    ALT 11 (*)    GFR calc non Af Amer 57 (*)    All other components within normal limits  URINALYSIS COMPLETEWITH MICROSCOPIC ([facility] ONLY) - Abnormal; Notable for the following:    Color, Urine YELLOW (*)    APPearance CLEAR (*)    All other components within normal limits  URINE DRUG SCREEN, QUALITATIVE ([facility] ONLY) - Abnormal; Notable for the following:    Benzodiazepine, Ur Scrn POSITIVE (*)    All other components within normal limits  LIPASE, BLOOD - Abnormal; Notable for the following:    Lipase 72 (*)    All other components within normal limits  CBC WITH DIFFERENTIAL/PLATELET - Abnormal; Notable for the following:    RBC 3.58 (*)    Hemoglobin 11.2 (*)    HCT 32.6 (*)    RDW 18.0 (*)    Lymphs Abs 0.9 (*)    All other components  within normal limits  ETHANOL  MAGNESIUM   ____________________________________________  EKG  I personally interpreted any EKGs ordered by me or triage Initial EKG shows a sinus rhythm rate 81 bpm with a right bundle branch block and left anterior fascicular block.  Repeat EKG shows with wandering baseline, same known right bundle-branch block, same LAFB. Likely sinus rhythm rate 123, ordering baseline noted. ____________________________________________  [id number]  I reviewed any imaging ordered by me or triage that were performed during my shift and, if possible, patient and/or family made aware of any abnormal findings. ____________________________________________   PROCEDURES  Procedure(s) performed: None  Critical Care performed: None  ____________________________________________   INITIAL IMPRESSION / ASSESSMENT AND PLAN / ED COURSE  Pertinent labs & imaging results that were available during my care of the patient were reviewed by me and considered in my medical decision making (see chart for details).  Patient with chronic hyponatremia, chronic alcohol abuse, poor conditioning, and homelessness presented today with no particular symptoms. He was not very clear to speech but is unclear exactly why. I clear how long he has been like that or if this is his baseline.  ----------------------------------------- 11:21 PM on 02/09/2016 -----------------------------------------  After a very unremarkable workup here, patient began to become somewhat tachycardic in the little tremulous and appears that he is entered in mild alcohol withdrawal. We gave him 2 of Ativan at this time his heart rate is 105. It is unclear exactly what brought the patient and originally but at this time, he certainly has developed some mild withdrawal. He did have some confusion and I could develop early-onset withdrawal although it's hard to tell. We did give him thiamine and folate IV fluids, and he is  signed out to dr. Owens Shark at the end of my shift.  ____________________________________________   FINAL CLINICAL IMPRESSION(S) / ED DIAGNOSES  Final diagnoses:  None      This chart was dictated using voice recognition software.  Despite best efforts to proofread,  errors can occur which can change meaning.     Schuyler Amor, MD 02/09/16 2322  Schuyler Amor, MD 02/09/16 [phone]

## 2016-02-09 NOTE — ED Notes (Signed)
Keaghan Bowens RN helped the Pt to void on a urinal.

## 2016-02-09 NOTE — ED Notes (Signed)
Pt went to CT

## 2016-02-09 NOTE — Care Management Important Message (Signed)
Important Message  Patient Details  Name: Thomas [name] MRN: [id number] Date of Birth: 08-29-1950   Medicare Important Message Given:  Yes    Juliann Pulse A Letanya Froh 02/09/2016, 10:44 AM

## 2016-02-09 NOTE — ED Notes (Signed)
EMS pt via wheelchair , was seen and discharged within 24 hours , pt drinks ETOH daily and has not had any since being discharged. Pt feeling weak.

## 2016-02-09 NOTE — Discharge Summary (Signed)
[place] [facility] [place] NAME: Thomas [name]    MR#:  [id number]  DATE OF BIRTH:  Aug 10, 1950  DATE OF ADMISSION:  02/06/2016 ADMITTING PHYSICIAN: Harrie Foreman, MD  DATE OF DISCHARGE: 02/09/16  PRIMARY CARE PHYSICIAN: [facility]    ADMISSION DIAGNOSIS:  Alcohol abuse [F10.10] Hyponatremia [E87.1] Protein-calorie malnutrition, severe (H[facility]) [E43] Chronic obstructive pulmonary disease, unspecified COPD type ([place]) [J44.9]  DISCHARGE DIAGNOSIS:  Principal Problem:   Alcohol use disorder, severe, dependence ([place]) Active Problems:   Hyponatremia   SECONDARY DIAGNOSIS:   Past Medical History  Diagnosis Date  . Hypertension   . 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 Enlarged prostate   . Tongue cancer ([place])   . Renal disorder   . Lung infection     no lung cancer  . Radiation     to neck  . Status post chemotherapy   . Skin cancer     HOSPITAL COURSE:   65y/o M with PMH of alcohol use, HTN presents to the hospital secondary to secondary to confusion and noted to have hyponatremia.  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 Appreciate nephrology consult. -Continue fluid restriction upto 1500cc/day  - avoid alcohol use. Received fluids in the hospital  #2 Hypothyroidism- continue synthroid  #3 Alcohol abuse- CIWA protocol in hospital, Improved, advised against alcohol again   #4 hypertension-on metoprolol, clonidine and norvasc. 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 Need to follow-up with [facility] after discharge  #6 insomnia- on Remeron at 7.5 mg at bedtime with  improvement  Physical Therapy recommended that patient is at his baseline with ambulation- no further PT needs. He will be discharged home. Note - he was discharged to [facility] for unsteady gait following his last hospitalization 2 weeks ago- but he ran out of there for getting alcohol  DISCHARGE CONDITIONS:   Guarded  CONSULTS OBTAINED:  Treatment Team:  Anthonette Legato, MD  DRUG ALLERGIES:  No Known Allergies  DISCHARGE MEDICATIONS:   Current Discharge Medication List    START taking these medications   Details  cloNIDine (CATAPRES) 0.1 MG tablet Take 1 tablet (0.1 mg total) by mouth 2 (two) times daily. Qty: 60 tablet, Refills: 2    Multiple Vitamin (MULTIVITAMIN WITH MINERALS) TABS tablet Take 1 tablet by mouth daily. Qty: 30 tablet, Refills: 2      CONTINUE these medications which have CHANGED   Details  amLODipine (NORVASC) 5 MG tablet Take 1 tablet (5 mg total) by mouth daily. Qty: 30 tablet, Refills: 2    levothyroxine (SYNTHROID, LEVOTHROID) 50 MCG tablet Take 1 tablet (50 mcg total) by mouth daily before breakfast. Qty: 30 tablet, Refills: 2    metoprolol (LOPRESSOR) 50 MG tablet Take 1 tablet (50 mg total) by mouth 2 (two) times daily. Qty: 60 tablet, Refills: 2    mirtazapine (REMERON) 7.5 MG tablet Take 1 tablet (7.5 mg total) by mouth at bedtime. Qty: 30 tablet, Refills: 2      CONTINUE these medications which have NOT CHANGED   Details  Fluticasone-Salmeterol (ADVAIR DISKUS) 250-50 MCG/DOSE AEPB Inhale 1 puff into the lungs 2 (two) times daily. Qty:  60 each, Refills: 1    tiotropium (SPIRIVA) 18 MCG inhalation capsule Place 1 capsule (18 mcg total) into inhaler and inhale daily. Qty: 30 capsule, Refills: 2         DISCHARGE INSTRUCTIONS:   1. [facility] follow-up for oncology department in 2 weeks 2. PCP follow-up in 1-2 weeks 3. Fluid restriction of upto 1500cc/day 4. NO ALCOHOL USE  If you experience worsening of your admission  symptoms, develop shortness of breath, life threatening emergency, suicidal or homicidal thoughts you must seek medical attention immediately by calling 911 or calling your MD immediately  if symptoms less severe.  You Must read complete instructions/literature along with all the possible adverse reactions/side effects for all the Medicines you take and that have been prescribed to you. Take any new Medicines after you have completely understood and accept all the possible adverse reactions/side effects.   Please note  You were cared for by a hospitalist during your hospital stay. If you have any questions about your discharge medications or the care you received while you were in the hospital after you are discharged, you can call the unit and asked to speak with the hospitalist on call if the hospitalist that took care of you is not available. Once you are discharged, your primary care physician will handle any further medical issues. Please note that NO REFILLS for any discharge medications will be authorized once you are discharged, as it is imperative that you return to your primary care physician (or establish a relationship with a primary care physician if you do not have one) for your aftercare needs so that they can reassess your need for medications and monitor your lab values.    Today   CHIEF COMPLAINT:   Chief Complaint  Patient presents with  . Anorexia     VITAL SIGNS:  Blood pressure 172/87, pulse 74, temperature 98.2 F (36.8 C), temperature source Oral, resp. rate 18, height 5\' 7"  (1.702 m), weight 58.968 kg (130 lb), SpO2 99 %.  I/O:   Intake/Output Summary (Last 24 hours) at 02/09/16 0917 Last data filed at 02/09/16 0758  Gross per 24 hour  Intake    600 ml  Output    200 ml  Net    400 ml    PHYSICAL EXAMINATION:   Physical Exam  GENERAL: 65 y.o.-year-old disshelved patient sitting in the bed.  EYES: Pupils equal, round, reactive to light and  accommodation. No scleral icterus. Extraocular muscles intact.  HEENT: Head atraumatic, normocephalic. Oropharynx and nasopharynx clear.  NECK: Supple, no jugular venous distention. No thyroid enlargement, no tenderness.  LUNGS: Normal breath sounds bilaterally, no wheezing, rales,rhonchi or crepitation. No use of accessory muscles of respiration. Decreased bibasilar breath sounds CARDIOVASCULAR: S1, S2 normal. No murmurs, rubs, or gallops.  ABDOMEN: Soft, nontender, nondistended. Bowel sounds present. No organomegaly or mass.  EXTREMITIES: No pedal edema, cyanosis, or clubbing.  NEUROLOGIC: Cranial nerves II through XII are intact. Muscle strength 5/5 in all extremities. Sensation intact. Gait not checked.  PSYCHIATRIC: The patient is alert and oriented x 3.  SKIN: Dry skin noted on both legs. No rash noted. Chronic hyperpigmentation from eczema noted    DATA REVIEW:   CBC  Recent Labs Lab 02/06/16 2334  WBC 4.7  HGB 11.8*  HCT 32.3*  PLT 335    Chemistries   Recent Labs Lab 02/06/16 2334  02/08/16 0531  NA 111*  < > 129*  K 3.7  < > 3.7  CL 76*  < >  97*  CO2 25  < > 27  GLUCOSE 84  < > 99  BUN 5*  < > 12  CREATININE 1.01  < > 1.06  CALCIUM 8.5*  < > 8.5*  AST 26  --   --   ALT 14*  --   --   ALKPHOS 116  --   --   BILITOT 0.5  --   --   < > = values in this interval not displayed.  Cardiac Enzymes No results for input(s): TROPONINI in the last 168 hours.  Microbiology Results  Results for orders placed or performed during the hospital encounter of 01/22/16  CULTURE, BLOOD (ROUTINE X 2) w Reflex to ID Panel     Status: None   Collection Time: 01/24/16  3:15 PM  Result Value Ref Range Status   Specimen Description BLOOD RIGHT A  Final   Special Requests   Final    BOTTLES DRAWN AEROBIC AND ANAEROBIC AER 10ML ANA 5ML   Culture NO GROWTH 5 DAYS  Final   Report Status 01/29/2016 FINAL  Final  CULTURE, BLOOD (ROUTINE X 2) w Reflex to ID Panel      Status: None   Collection Time: 01/24/16  3:15 PM  Result Value Ref Range Status   Specimen Description BLOOD LEFT A  Final   Special Requests   Final    BOTTLES DRAWN AEROBIC AND ANAEROBIC AER 10ML ANA 9ML   Culture NO GROWTH 5 DAYS  Final   Report Status 01/29/2016 FINAL  Final    RADIOLOGY:  No results found.  EKG:   Orders placed or performed during the hospital encounter of 12/11/14  . EKG (if new onset seizures)  . EKG (if new onset seizures)  . EKG 12-Lead  . EKG 12-Lead  . EKG 12-Lead  . EKG 12-Lead  . EKG      Management plans discussed with the patient, family and they are in agreement.  CODE STATUS:     Code Status Orders        Start     Ordered   02/07/16 0231  Full code   Continuous     02/07/16 0230    Code Status History    Date Active Date Inactive Code Status Order ID Comments User Context   01/22/2016  5:52 AM 01/26/2016  6:05 PM Full Code [id number]  Harrie Foreman, MD Inpatient   08/17/2015 10:29 PM 08/21/2015  6:00 PM Full Code [id number]  Lance Coon, MD Inpatient   12/12/2014  1:19 AM 12/18/2014  4:59 PM Full Code [id number]  Lytle Butte, MD ED      TOTAL TIME TAKING CARE OF THIS PATIENT: 37 minutes.    Jimie Kuwahara M.D on 02/09/2016 at 9:17 AM  Between 7am to 6pm - Pager - [phone]  After 6pm go to [email or web address] - password [email or web address] [facility]  [facility]  Office  [phone]  CC: Primary care physician; [place]

## 2016-02-09 NOTE — ED Notes (Addendum)
Pt arrives with complaints of weakness, pt was discharged from hospital this AM, states mid abd pain, pt denies ETOH but demonstrates slurred speech and slow to respond

## 2016-02-09 NOTE — ED Notes (Signed)
Euna Armon RN helped the Pt to void on a urinal.

## 2016-02-09 NOTE — ED Notes (Signed)
Pt went to X-Ray.  

## 2016-02-09 NOTE — ED Notes (Signed)
Jimesha Rising RN helped the Pt to void on a urinal.

## 2016-02-09 NOTE — Care Management (Signed)
Taxi voucher issued to [name] for [address] in [place], [place]. States his friend Vida Rigger lives there. Discharge to home today per Dr, Tressia Miners. Shelbie Ammons RN MSN CCM Care Management [phone]

## 2016-02-09 NOTE — Progress Notes (Signed)
[facility]  ROUNDING NOTE   Subjective:  Patient resting comfortably in bed. Serum sodium up to 130.    Objective:  Vital signs in last 24 hours:  Temp:  [98.2 F (36.8 C)-99.2 F (37.3 C)] 98.2 F (36.8 C) (07/05 0454) Pulse Rate:  [68-74] 74 (07/05 0820) Resp:  [18] 18 (07/04 1401) BP: (111-172)/(49-87) 172/87 mmHg (07/05 0820) SpO2:  [99 %-100 %] 99 % (07/05 0454) Weight:  [58.968 kg (130 lb)] 58.968 kg (130 lb) (07/05 0454)  Weight change: 0.907 kg (2 lb) Filed Weights   02/07/16 2349 02/08/16 0500 02/09/16 0454  Weight: 58.06 kg (128 lb) 57.607 kg (127 lb) 58.968 kg (130 lb)    Intake/Output: I/O last 3 completed shifts: In: 600 [P.O.:600] Out: 700 [Urine:700]   Intake/Output this shift:  Total I/O In: 240 [P.O.:240] Out: -   Physical Exam: General: NAD, sitting up in bed  Head: Normocephalic, atraumatic. Moist oral mucosal membranes  Eyes: Anicteric  Neck: Supple, trachea midline  Lungs:  Clear to auscultation  Heart: Regular rate and rhythm  Abdomen:  Soft, nontender, BS present  Extremities: no peripheral edema.  Neurologic: Nonfocal, moving all four extremities  Skin: Rash noted on b/l UE's   Access: none    Basic Metabolic Panel:  Recent Labs Lab 02/06/16 2334 02/07/16 1328 02/08/16 0531 02/09/16 0938  NA 111* 120* 129* 130*  K 3.7 3.9 3.7 3.5  CL 76* 88* 97* 97*  CO2 25 25 27 26   GLUCOSE 84 180* 99 121*  BUN 5* 9 12 14   CREATININE 1.01 1.15 1.06 1.17  CALCIUM 8.5* 8.1* 8.5* 8.5*    Liver Function Tests:  Recent Labs Lab 02/06/16 2334  AST 26  ALT 14*  ALKPHOS 116  BILITOT 0.5  PROT 7.4  ALBUMIN 4.3    Recent Labs Lab 02/05/16 2334  LIPASE 41    Recent Labs Lab 02/06/16 2334  AMMONIA 11    CBC:  Recent Labs Lab 02/06/16 2334  WBC 4.7  HGB 11.8*  HCT 32.3*  MCV 86.9  PLT 335    Cardiac Enzymes: No results for input(s): CKTOTAL, CKMB, CKMBINDEX, TROPONINI in the last 168  hours.  BNP: Invalid input(s): POCBNP  CBG: No results for input(s): GLUCAP in the last 168 hours.  Microbiology: Results for orders placed or performed during the hospital encounter of 01/22/16  CULTURE, BLOOD (ROUTINE X 2) w Reflex to ID Panel     Status: None   Collection Time: 01/24/16  3:15 PM  Result Value Ref Range Status   Specimen Description BLOOD RIGHT A  Final   Special Requests   Final    BOTTLES DRAWN AEROBIC AND ANAEROBIC AER 10ML ANA 5ML   Culture NO GROWTH 5 DAYS  Final   Report Status 01/29/2016 FINAL  Final  CULTURE, BLOOD (ROUTINE X 2) w Reflex to ID Panel     Status: None   Collection Time: 01/24/16  3:15 PM  Result Value Ref Range Status   Specimen Description BLOOD LEFT A  Final   Special Requests   Final    BOTTLES DRAWN AEROBIC AND ANAEROBIC AER 10ML ANA 9ML   Culture NO GROWTH 5 DAYS  Final   Report Status 01/29/2016 FINAL  Final    Coagulation Studies: No results for input(s): LABPROT, INR in the last 72 hours.  Urinalysis: No results for input(s): COLORURINE, LABSPEC, PHURINE, GLUCOSEU, HGBUR, BILIRUBINUR, KETONESUR, PROTEINUR, UROBILINOGEN, NITRITE, LEUKOCYTESUR in the last 72 hours.  Invalid input(s):  APPERANCEUR    Imaging: No results found.   Medications:     . amLODipine  5 mg Oral Daily  . cloNIDine  0.1 mg Oral BID  . docusate sodium  100 mg Oral BID  . enoxaparin (LOVENOX) injection  40 mg Subcutaneous Q24H  . feeding supplement (ENSURE ENLIVE)  237 mL Oral BID BM  . folic acid  1 mg Oral Daily  . levothyroxine  50 mcg Oral Q0600  . LORazepam  0-4 mg Sublingual Q12H  . metoprolol  50 mg Oral BID  . mirtazapine  7.5 mg Oral QHS  . mometasone-formoterol  2 puff Inhalation BID  . multivitamin with minerals  1 tablet Oral Daily  . thiamine  100 mg Oral Daily   Or  . thiamine  100 mg Intravenous Daily  . tiotropium  18 mcg Inhalation Daily   acetaminophen **OR** acetaminophen, albuterol, LORazepam **OR** LORazepam,  ondansetron **OR** ondansetron (ZOFRAN) IV  Assessment/ Plan:  65 y.o. 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 Hyponatremia: history suggestive related to EtOH either from beer potomania - Presented similarly several weeks ago. -  Serum sodium currently 130.  Sodium currently is the best it's been in several months. Continue to monitor.  2. Hypertension: Blood pressure currently 172/87. Blood pressure has been labile.  Continue amlodipine, clonidine, and metoprolol for now.  3. Alcohol abuse:  Patient counseled on alcohol cessation. Continue Ativan, thiamine, folic acid.    LOS: 2 Kyndel Egger 7/5/201711:00 AM

## 2016-02-09 NOTE — Clinical Social Work Note (Signed)
Pt is ready for discharge today. CSW met with pt and provided information for substance abuse resources. Pt shared that was not interested in speaking to CSW, however would be later on. CSW will attempt to see pt prior to discharge.   Darden Dates, MSW, LCSW  Clinical Social Worker  [phone]

## 2016-02-10 MED ORDER — SODIUM CHLORIDE 0.9 % IV BOLUS (SEPSIS)
1000.0000 mL | Freq: Once | INTRAVENOUS | Status: AC
  Administered 2016-02-10: 1000 mL via INTRAVENOUS

## 2016-02-10 NOTE — Discharge Instructions (Signed)
Hyponatremia Hyponatremia is when the amount of salt (sodium) in your blood is too low. When sodium levels are low, your cells absorb extra water and they swell. The swelling happens throughout the body, but it mostly affects the brain. CAUSES This condition may be caused by:  Heart, kidney, or liver problems.  Thyroid problems.  Adrenal gland problems.  Metabolic conditions, such as syndrome of inappropriate antidiuretic hormone (SIADH).  Severe vomiting and diarrhea.  Certain medicines or illegal drugs.  Dehydration.  Drinking too much water.  Eating a diet that is low in sodium.  Large burns on your body.  Sweating. RISK FACTORS This condition is more likely to develop in people who:  Have long-term (chronic) kidney disease.  Have heart failure.  Have a medical condition that causes frequent or excessive diarrhea.  Have metabolic conditions, such as Addison disease or SIADH.  Take certain medicines that affect the sodium and fluid balance in the blood. Some of these medicine types include:  Diuretics.  NSAIDs.  Some opioid pain medicines.  Some antidepressants.  Some seizure prevention medicines. SYMPTOMS  Symptoms of this condition include:  Nausea and vomiting.  Confusion.  Lethargy.  Agitation.  Headache.  Seizures.  Unconsciousness.  Appetite loss.  Muscle weakness and cramping.  Feeling weak or light-headed.  Having a rapid heart rate.  Fainting, in severe cases. DIAGNOSIS This condition is diagnosed with a medical history and physical exam. You will also have other tests, including:  Blood tests.  Urine tests. TREATMENT Treatment for this condition depends on the cause. Treatment may include:  Fluids given through an IV tube that is inserted into one of your veins.  Medicines to correct the sodium imbalance. If medicines are causing the condition, the medicines will need to be adjusted.  Limiting water or fluid intake to  get the correct sodium balance. HOME CARE INSTRUCTIONS  Take medicines only as directed by your health care provider. Many medicines can make this condition worse. Talk with your health care provider about any medicines that you are currently taking.  Carefully follow a recommended diet as directed by your health care provider.  Carefully follow instructions from your health care provider about fluid restrictions.  Keep all follow-up visits as directed by your health care provider. This is important.  Do not drink alcohol. SEEK MEDICAL CARE IF:  You develop worsening nausea, fatigue, headache, confusion, or weakness.  Your symptoms go away and then return.  You have problems following the recommended diet. SEEK IMMEDIATE MEDICAL CARE IF:  You have a seizure.  You faint.  You have ongoing diarrhea or vomiting.   This information is not intended to replace advice given to you by your health care provider. Make sure you discuss any questions you have with your health care provider.   Document Released: 07/14/2002 Document Revised: 12/08/2014 Document Reviewed: 08/13/2014 Elsevier Interactive Patient Education 2016 Else[employer] Inc[employer]  Weakness Weakness is a lack of strength. It may be felt all over the body (generalized) or in one specific part of the body (focal). Some causes of weakness can be serious. You may need further medical evaluation, especially if you are elderly or you have a history of immunosuppression (such as chemotherapy or HIV), kidney disease, heart disease, or diabetes. CAUSES  Weakness can be caused by many different things, including:  Infection.  Physical exhaustion.  Internal bleeding or other blood loss that results in a lack of red blood cells (anemia).  Dehydration. This cause is more common in  elderly people.  Side effects or electrolyte abnormalities from medicines, such as pain medicines or sedatives.  Emotional distress, anxiety, or  depression.  Circulation problems, especially severe peripheral arterial disease.  Heart disease, such as rapid atrial fibrillation, bradycardia, or heart failure.  Nervous system disorders, such as Guillain-Barr syndrome, multiple sclerosis, or stroke. DIAGNOSIS  To find the cause of your weakness, your caregiver will take your history and perform a physical exam. Lab tests or X-rays may also be ordered, if needed. TREATMENT  Treatment of weakness depends on the cause of your symptoms and can vary greatly. HOME CARE INSTRUCTIONS   Rest as needed.  Eat a well-balanced diet.  Try to get some exercise every day.  Only take over-the-counter or prescription medicines as directed by your caregiver. SEEK MEDICAL CARE IF:   Your weakness seems to be getting worse or spreads to other parts of your body.  You develop new aches or pains. SEEK IMMEDIATE MEDICAL CARE IF:   You cannot perform your normal daily activities, such as getting dressed and feeding yourself.  You cannot walk up and down stairs, or you feel exhausted when you do so.  You have shortness of breath or chest pain.  You have difficulty moving parts of your body.  You have weakness in only one area of the body or on only one side of the body.  You have a fever.  You have trouble speaking or swallowing.  You cannot control your bladder or bowel movements.  You have black or bloody vomit or stools. MAKE SURE YOU:  Understand these instructions.  Will watch your condition.  Will get help right away if you are not doing well or get worse.   This information is not intended to replace advice given to you by your health care provider. Make sure you discuss any questions you have with your health care provider.   Document Released: 07/24/2005 Document Revised: 01/23/2012 Document Reviewed: 09/22/2011 Elsevier Interactive Patient Education [employer].

## 2016-02-10 NOTE — ED Notes (Signed)
Pt's bedding was changed since the Pt voided on the bed.

## 2016-02-19 ENCOUNTER — Encounter: Payer: Self-pay | Admitting: Emergency Medicine

## 2016-02-19 ENCOUNTER — Inpatient Hospital Stay
Admission: EM | Admit: 2016-02-19 | Discharge: 2016-02-22 | DRG: 897 | Disposition: A | Discharge status: Skilled Nursing Facility | Payer: Medicare Other | Attending: Internal Medicine | Admitting: Internal Medicine

## 2016-02-19 DIAGNOSIS — Y906 Blood alcohol level of 120-199 mg/100 ml: Secondary | ICD-10-CM | POA: Diagnosis present

## 2016-02-19 DIAGNOSIS — G4700 Insomnia, unspecified: Secondary | ICD-10-CM | POA: Diagnosis present

## 2016-02-19 DIAGNOSIS — F17210 Nicotine dependence, cigarettes, uncomplicated: Secondary | ICD-10-CM | POA: Diagnosis present

## 2016-02-19 DIAGNOSIS — N400 Enlarged prostate without lower urinary tract symptoms: Secondary | ICD-10-CM | POA: Diagnosis present

## 2016-02-19 DIAGNOSIS — R410 Disorientation, unspecified: Secondary | ICD-10-CM | POA: Diagnosis not present

## 2016-02-19 DIAGNOSIS — I10 Essential (primary) hypertension: Secondary | ICD-10-CM | POA: Diagnosis present

## 2016-02-19 DIAGNOSIS — E222 Syndrome of inappropriate secretion of antidiuretic hormone: Secondary | ICD-10-CM | POA: Diagnosis present

## 2016-02-19 DIAGNOSIS — K5900 Constipation, unspecified: Secondary | ICD-10-CM | POA: Diagnosis present

## 2016-02-19 DIAGNOSIS — Z85810 Personal history of malignant neoplasm of tongue: Secondary | ICD-10-CM | POA: Diagnosis not present

## 2016-02-19 DIAGNOSIS — Z79899 Other long term (current) drug therapy: Secondary | ICD-10-CM | POA: Diagnosis not present

## 2016-02-19 DIAGNOSIS — E871 Hypo-osmolality and hyponatremia: Secondary | ICD-10-CM

## 2016-02-19 DIAGNOSIS — E039 Hypothyroidism, unspecified: Secondary | ICD-10-CM | POA: Diagnosis present

## 2016-02-19 DIAGNOSIS — Z789 Other specified health status: Secondary | ICD-10-CM

## 2016-02-19 DIAGNOSIS — F10229 Alcohol dependence with intoxication, unspecified: Secondary | ICD-10-CM | POA: Diagnosis not present

## 2016-02-19 DIAGNOSIS — Z85828 Personal history of other malignant neoplasm of skin: Secondary | ICD-10-CM

## 2016-02-19 DIAGNOSIS — J449 Chronic obstructive pulmonary disease, unspecified: Secondary | ICD-10-CM | POA: Diagnosis present

## 2016-02-19 DIAGNOSIS — Z590 Homelessness: Secondary | ICD-10-CM | POA: Diagnosis not present

## 2016-02-19 DIAGNOSIS — M79673 Pain in unspecified foot: Secondary | ICD-10-CM

## 2016-02-19 LAB — TSH: TSH: 10.356 u[IU]/mL — AB (ref 0.350–4.500)

## 2016-02-19 LAB — BASIC METABOLIC PANEL
ANION GAP: 9 (ref 5–15)
BUN: 7 mg/dL (ref 6–20)
CO2: 24 mmol/L (ref 22–32)
Calcium: 8.6 mg/dL — ABNORMAL LOW (ref 8.9–10.3)
Chloride: 88 mmol/L — ABNORMAL LOW (ref 101–111)
Creatinine, Ser: 0.93 mg/dL (ref 0.61–1.24)
GLUCOSE: 91 mg/dL (ref 65–99)
POTASSIUM: 4.8 mmol/L (ref 3.5–5.1)
Sodium: 121 mmol/L — ABNORMAL LOW (ref 135–145)

## 2016-02-19 LAB — MAGNESIUM: Magnesium: 1.9 mg/dL (ref 1.7–2.4)

## 2016-02-19 LAB — ETHANOL: ALCOHOL ETHYL (B): 141 mg/dL — AB (ref <5)

## 2016-02-19 LAB — SODIUM, URINE, RANDOM: Sodium, Ur: 28 mmol/L

## 2016-02-19 LAB — OSMOLALITY, URINE: OSMOLALITY UR: 236 mosm/kg — AB (ref 300–900)

## 2016-02-19 MED ORDER — IPRATROPIUM-ALBUTEROL 0.5-2.5 (3) MG/3ML IN SOLN: 3.0000 mL | RESPIRATORY_TRACT | Status: DC | PRN

## 2016-02-19 MED ORDER — FOLIC ACID 1 MG PO TABS
1.0000 mg | ORAL_TABLET | Freq: Every day | ORAL | Status: DC
  Administered 2016-02-19 – 2016-02-22 (×4): 1 mg via ORAL
  Filled 2016-02-19 (×4): qty 1

## 2016-02-19 MED ORDER — ACETAMINOPHEN 650 MG RE SUPP: 650.0000 mg | Freq: Four times a day (QID) | RECTAL | Status: DC | PRN

## 2016-02-19 MED ORDER — ADULT MULTIVITAMIN W/MINERALS CH
1.0000 | ORAL_TABLET | Freq: Every day | ORAL | Status: DC
  Administered 2016-02-19 – 2016-02-22 (×4): 1 via ORAL
  Filled 2016-02-19 (×4): qty 1

## 2016-02-19 MED ORDER — SODIUM CHLORIDE 0.9 % IV SOLN
INTRAVENOUS | Status: DC
  Administered 2016-02-19 – 2016-02-20 (×2): via INTRAVENOUS

## 2016-02-19 MED ORDER — ADULT MULTIVITAMIN W/MINERALS CH: 1.0000 | ORAL_TABLET | Freq: Every day | ORAL | Status: DC

## 2016-02-19 MED ORDER — VITAMIN B-1 100 MG PO TABS
100.0000 mg | ORAL_TABLET | Freq: Every day | ORAL | Status: DC
  Administered 2016-02-19 – 2016-02-22 (×4): 100 mg via ORAL
  Filled 2016-02-19 (×4): qty 1

## 2016-02-19 MED ORDER — METOPROLOL TARTRATE 50 MG PO TABS
50.0000 mg | ORAL_TABLET | Freq: Two times a day (BID) | ORAL | Status: DC
  Administered 2016-02-19 – 2016-02-22 (×7): 50 mg via ORAL
  Filled 2016-02-19 (×7): qty 1

## 2016-02-19 MED ORDER — MIRTAZAPINE 15 MG PO TABS
7.5000 mg | ORAL_TABLET | Freq: Every day | ORAL | Status: DC
  Administered 2016-02-19 – 2016-02-21 (×3): 7.5 mg via ORAL
  Filled 2016-02-19 (×3): qty 1

## 2016-02-19 MED ORDER — TIOTROPIUM BROMIDE MONOHYDRATE 18 MCG IN CAPS
18.0000 ug | ORAL_CAPSULE | Freq: Every day | RESPIRATORY_TRACT | Status: DC
  Administered 2016-02-19 – 2016-02-22 (×4): 18 ug via RESPIRATORY_TRACT
  Filled 2016-02-19: qty 5

## 2016-02-19 MED ORDER — THIAMINE HCL 100 MG/ML IJ SOLN: 100.0000 mg | Freq: Every day | INTRAMUSCULAR | Status: DC

## 2016-02-19 MED ORDER — LORAZEPAM 1 MG PO TABS
1.0000 mg | ORAL_TABLET | Freq: Four times a day (QID) | ORAL | Status: AC | PRN
  Administered 2016-02-22: 06:00:00 1 mg via ORAL
  Filled 2016-02-19 (×2): qty 1

## 2016-02-19 MED ORDER — AMLODIPINE BESYLATE 5 MG PO TABS
5.0000 mg | ORAL_TABLET | Freq: Every day | ORAL | Status: DC
  Administered 2016-02-19 – 2016-02-22 (×4): 5 mg via ORAL
  Filled 2016-02-19 (×4): qty 1

## 2016-02-19 MED ORDER — SODIUM CHLORIDE 0.9 % IV BOLUS (SEPSIS): 1000.0000 mL | Freq: Once | INTRAVENOUS | Status: DC

## 2016-02-19 MED ORDER — ACETAMINOPHEN 325 MG PO TABS: 650.0000 mg | ORAL_TABLET | Freq: Four times a day (QID) | ORAL | Status: DC | PRN

## 2016-02-19 MED ORDER — ONDANSETRON HCL 4 MG/2ML IJ SOLN
4.0000 mg | Freq: Four times a day (QID) | INTRAMUSCULAR | Status: DC | PRN
  Administered 2016-02-19: 20:00:00 4 mg via INTRAVENOUS
  Filled 2016-02-19: qty 2

## 2016-02-19 MED ORDER — LORAZEPAM 2 MG/ML IJ SOLN: 1.0000 mg | Freq: Four times a day (QID) | INTRAMUSCULAR | Status: AC | PRN

## 2016-02-19 MED ORDER — CLONIDINE HCL 0.1 MG PO TABS
0.1000 mg | ORAL_TABLET | Freq: Two times a day (BID) | ORAL | Status: DC
  Administered 2016-02-19 – 2016-02-22 (×7): 0.1 mg via ORAL
  Filled 2016-02-19 (×7): qty 1

## 2016-02-19 MED ORDER — LORAZEPAM 2 MG PO TABS: 0.0000 mg | ORAL_TABLET | Freq: Two times a day (BID) | ORAL | Status: DC

## 2016-02-19 MED ORDER — MOMETASONE FURO-FORMOTEROL FUM 200-5 MCG/ACT IN AERO
2.0000 | INHALATION_SPRAY | Freq: Two times a day (BID) | RESPIRATORY_TRACT | Status: DC
  Administered 2016-02-20 – 2016-02-22 (×5): 2 via RESPIRATORY_TRACT
  Filled 2016-02-19: qty 8.8

## 2016-02-19 MED ORDER — LEVOTHYROXINE SODIUM 50 MCG PO TABS
50.0000 ug | ORAL_TABLET | Freq: Every day | ORAL | Status: DC
  Administered 2016-02-20 – 2016-02-22 (×3): 50 ug via ORAL
  Filled 2016-02-19 (×3): qty 1

## 2016-02-19 MED ORDER — ONDANSETRON HCL 4 MG PO TABS: 4.0000 mg | ORAL_TABLET | Freq: Four times a day (QID) | ORAL | Status: DC | PRN

## 2016-02-19 MED ORDER — LORAZEPAM 2 MG PO TABS
0.0000 mg | ORAL_TABLET | Freq: Four times a day (QID) | ORAL | Status: DC
  Administered 2016-02-19: 21:00:00 1 mg via ORAL
  Administered 2016-02-20 – 2016-02-21 (×6): 2 mg via ORAL
  Filled 2016-02-19 (×6): qty 1

## 2016-02-19 MED ORDER — ENOXAPARIN SODIUM 40 MG/0.4ML ~~LOC~~ SOLN
40.0000 mg | SUBCUTANEOUS | Status: DC
  Administered 2016-02-19 – 2016-02-21 (×3): 40 mg via SUBCUTANEOUS
  Filled 2016-02-19 (×3): qty 0.4

## 2016-02-19 MED ORDER — SODIUM CHLORIDE 0.9 % IV BOLUS (SEPSIS)
250.0000 mL | Freq: Once | INTRAVENOUS | Status: AC
  Administered 2016-02-19: 250 mL via INTRAVENOUS

## 2016-02-19 NOTE — BH Assessment (Signed)
Per, ER RN - patient he was moved to Med surge and TTS services are no longer needed.

## 2016-02-19 NOTE — BH Assessment (Addendum)
Assessment Note  Patient is a 65 year old white male that requests alcohol detox.  Patient was transported to the ED from his home where he stays with friends.   Documentation in the epic chart reports that one of the friends called EMS because the patient has been drinking for a month and now wants help.   Patient denies SI/HI/Psychosis.  Patient denies prior inpatient hospitalization.  Patient denies prior mental health or psychiatric medication management. Patient denies physical, sexual or emotional abuse.   Documentation in the epic chart reports that the patient was holding a beer bottle in his hands actively drinking on EMS arrival.    Patient reports that he snuck his first drink at the age of 65 years old.  Patient reports that he drinks "as much as I can get" drinking store-bought alcohol.  Patient reports that he does not remember a time when he attained sobriety.  Patient reports receiving treatment and detox services from [facility] but does not remember the year when he received services at [facility]. Patient denies withdrawal symptoms.   Patient denies a history of seizures.     Diagnosis: Alcohol Dependence  Past Medical History:  Past Medical History  Diagnosis Date  . Hypertension   . 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 Enlarged prostate   . Tongue cancer ([place])   . Renal disorder   . Lung infection     no lung cancer  . Radiation     to neck  . Status post chemotherapy   . Skin cancer     Past Surgical History  Procedure Laterality Date  . Cancerous lymph nodes removed from neck    . Knee surgery Right   . Skin biopsy      Family History:  Family History  Problem Relation Age of Onset  . Hypertension Mother     Social History:  reports that he has been smoking Cigarettes.  He does not have any smokeless tobacco history on file. He reports that he drinks alcohol. He reports that he does not use illicit drugs.  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  1 - Frequency: Daily 1 - Duration: Patient reports that he has been drinking all of his life.  1 - Last Use / Amount: Today before EMS brought him to the ED  CIWA: CIWA-Ar BP: (!) 157/88 mmHg Pulse Rate: 71 Nausea and Vomiting: no nausea and no vomiting Tactile Disturbances: none Tremor: no tremor Auditory Disturbances: not present Paroxysmal Sweats: no sweat visible Visual Disturbances: not present Anxiety: no anxiety, at ease Headache, Fullness in Head: none present Agitation: normal activity Orientation and Clouding of Sensorium: disoriented for date by more than 2 calendar days CIWA-Ar Total: 3 COWS:    Allergies: No Known Allergies  Home Medications:  (Not in a hospital admission)  OB/GYN Status:  No LMP for male patient.  General Assessment Data Location of Assessment: [facility] ED TTS Assessment: In system Is this a Tele or Face-to-Face Assessment?: Face-to-Face Is this an Initial Assessment or a Re-assessment for this encounter?: Initial Assessment  Marital status: Single Maiden name: NA Is patient pregnant?: No Pregnancy Status: No Living Arrangements: Non-relatives/Friends Can pt return to current living arrangement?: Yes Admission Status: Voluntary Is patient capable of signing voluntary admission?: Yes Referral Source: Self/Family/Friend Insurance type: Medicare     Crisis Care Plan Living Arrangements: Non-relatives/Friends Legal Guardian:  (NA) Name of Psychiatrist: None Reported Name of Therapist: None Reported  Education Status Is patient currently  in school?: No Current Grade: NA Highest grade of school patient has completed: NA Name of school: NA Contact person: NA  Risk to self with the past 6 months Suicidal Ideation: No Has patient been a risk to self within the past 6 months prior to admission? : No Suicidal Intent: No Has patient had any suicidal intent within the past 6 months prior to admission? : No Is patient at risk for suicide?: No Suicidal Plan?: No Has patient had any suicidal plan within the past 6 months prior to admission? : No Access to Means: No What has been your use of drugs/alcohol within the last 12 months?: Alcohol Previous Attempts/Gestures: No How many times?: 0 Other Self Harm Risks: None Reported Triggers for Past Attempts:  (NA) Intentional Self Injurious Behavior: None Family Suicide History: No Recent stressful life event(s): Job Loss, Museum/gallery curator Problems (Homeless) Persecutory voices/beliefs?: No Depression: Yes Depression Symptoms: Despondent, Feeling worthless/self pity, Feeling angry/irritable, Loss of interest in usual pleasures Substance abuse history and/or treatment for substance abuse?: Yes Suicide prevention information given to non-admitted patients: Not applicable  Risk to Others within the past 6 months Homicidal Ideation: No Does patient have any lifetime risk of violence toward others beyond the six months prior to admission? : No Thoughts of Harm to Others: No Current Homicidal Intent: No Current Homicidal Plan: No Access to Homicidal Means: No Identified Victim: None Reported History of harm to others?: No Assessment of Violence: None Noted Violent Behavior Description: None  Does patient have access to weapons?: No Criminal Charges Pending?: No Does patient have a court date: No Is patient on probation?: No  Psychosis Hallucinations: None noted Delusions: None noted  Mental Status Report Appearance/Hygiene: Disheveled Eye Contact: Fair Motor Activity: Freedom  of movement Speech: Logical/coherent Level of Consciousness: Quiet/awake, Alert Mood: Depressed Affect: Appropriate to circumstance Anxiety Level: None Thought Processes: Coherent, Relevant Judgement: Unimpaired Orientation: Person, Place, Time, Situation Obsessive Compulsive Thoughts/Behaviors: None  Cognitive Functioning Concentration: Decreased Memory: Recent Intact, Remote Intact IQ: Average Insight: Fair Impulse Control: Poor Appetite: Fair Weight Loss: 0 Weight Gain: 0 Sleep: Decreased Total Hours of Sleep: 4 Vegetative Symptoms: Decreased grooming, Not bathing  ADLScreening [facility] Assessment Services) Patient's cognitive ability adequate to safely complete daily activities?: Yes Patient able to express need for assistance with ADLs?: Yes Independently performs ADLs?: Yes (appropriate for developmental age)  Prior Inpatient Therapy Prior Inpatient Therapy: No Prior Therapy Dates: NA Prior Therapy Facilty/Provider(s): NA Reason for Treatment: NA  Prior Outpatient Therapy Prior Outpatient Therapy: Yes Prior Therapy Dates: Unable to remember Prior Therapy Facilty/Provider(s): [facility] Reason for Treatment: Detox and Treatment Does patient have an ACCT team?: No Does patient have Intensive In-House Services?  : No Does patient have [facility] services? : No Does patient have P4CC services?: No  ADL Screening (condition at time of admission) Patient's cognitive ability adequate to safely complete daily activities?: Yes Is the patient deaf or have difficulty hearing?: No Does the patient have difficulty seeing, even when wearing glasses/contacts?: No Does the patient have difficulty concentrating, remembering, or making decisions?: No Patient able  to express need for assistance with ADLs?: Yes Does the patient have difficulty dressing or bathing?: No Independently performs ADLs?: Yes (appropriate for developmental age) Does the patient have difficulty walking or climbing  stairs?: No Weakness of Legs: None Weakness of Arms/Hands: None  Home Assistive Devices/Equipment Home Assistive Devices/Equipment: None    Abuse/Neglect Assessment (Assessment to be complete while patient is alone) Physical Abuse: Denies Verbal Abuse: Denies Sexual Abuse: Denies Exploitation of patient/patient's resources: Denies Self-Neglect: Denies Values / Beliefs Cultural Requests During Hospitalization: None Spiritual Requests During Hospitalization: None Consults Spiritual Care Consult Needed: No Social Work Consult Needed: No Regulatory affairs officer (For Healthcare) Does patient have an advance directive?: No Would patient like information on creating an advanced directive?: No - patient declined information    Additional Information 1:1 In Past 12 Months?: No CIRT Risk: No Elopement Risk: No Does patient have medical clearance?: Yes     Disposition: Pending psych disposition.  Disposition Initial Assessment Completed for this Encounter: Yes  On Site Evaluation by:   Reviewed with Physician:    Graciella Freer [place] 02/19/2016 2:34 PM

## 2016-02-19 NOTE — Plan of Care (Signed)
Problem: Safety: Goal: Ability to remain free from injury will improve Outcome: Progressing Bed Alarm Activated.  Problem: Fluid Volume: Goal: Ability to maintain a balanced intake and output will improve Outcome: Progressing Initiated NS at 3ml's/hr. Instructed to use urinal so staff can measure urine. Verbalized understanding.  Problem: Nutrition: Goal: Adequate nutrition will be maintained Outcome: Progressing Initiated Nutrition Consult related to decreased Intake.

## 2016-02-19 NOTE — ED Provider Notes (Signed)
[facility] Emergency Department Provider Note  ____________________________________________  Time seen: Approximately 12:33 PM  I have reviewed the triage vital signs and the nursing notes.   HISTORY  Chief Complaint Alcohol Intoxication    HPI Thomas [name] is a 65 y.o. 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  Patient reports to me that he is been feeling like he needs to get "detox". For the last several weeks he has been drinking "as much as I can get" drinking store-bought alcohol. Particularly he drinks primarily beer.  He denies being in pain, no hallucinations, confusion, or desire to harm himself. He reports that he just needs to "stop drinking" as it is having a huge effect on his life. He does report that he was recently through detox but a few weeks ago, and it appears that this was inpatient here.  Patient reports his last drink was a beer that he was drinking when he was picked up to come here.   Past Medical History  Diagnosis Date  . Hypertension   . 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 Enlarged prostate   . Tongue cancer ([place])   . Renal disorder   . Lung infection     no lung cancer  . Radiation     to neck  . Status post chemotherapy   . Skin cancer     Patient Active Problem List   Diagnosis Date Noted  . Protein-calorie malnutrition, severe 01/22/2016  . Alcohol use disorder, severe, dependence ([place]) 12/12/2014  . Hyponatremia 12/12/2014  . Essential hypertension 12/12/2014  . COPD (chronic obstructive pulmonary disease) ([place]) 12/12/2014    Past Surgical History  Procedure Laterality Date  . Cancerous lymph nodes removed from neck    . Knee surgery Right   . Skin biopsy      Current Outpatient Rx  Name  Route  Sig  Dispense  Refill  . amLODipine (NORVASC) 5 MG tablet   Oral   Take 1 tablet (5 mg total) by mouth  daily.   30 tablet   2   . cloNIDine (CATAPRES) 0.1 MG tablet   Oral   Take 1 tablet (0.1 mg total) by mouth 2 (two) times daily.   60 tablet   2   . Fluticasone-Salmeterol (ADVAIR DISKUS) 250-50 MCG/DOSE AEPB   Inhalation   Inhale 1 puff into the lungs 2 (two) times daily.   60 each   1   . levothyroxine (SYNTHROID, LEVOTHROID) 50 MCG tablet   Oral   Take 1 tablet (50 mcg total) by mouth daily before breakfast.   30 tablet   2   . metoprolol (LOPRESSOR) 50 MG tablet   Oral   Take 1 tablet (50 mg total) by mouth 2 (two) times daily.   60 tablet   2   . mirtazapine (REMERON) 7.5 MG tablet   Oral   Take 1 tablet (7.5 mg total) by mouth at bedtime.   30 tablet   2   . Multiple Vitamin (MULTIVITAMIN WITH MINERALS) TABS tablet   Oral   Take 1 tablet by mouth daily.   30 tablet   2   . tiotropium (SPIRIVA) 18 MCG inhalation capsule   Inhalation   Place 1 capsule (18 mcg total) into inhaler and inhale daily.   30 capsule   2     Allergies Review of patient's allergies  indicates no known allergies.  Family History  Problem Relation Age of Onset  . Hypertension Mother     Social History Social History  Substance Use Topics  . Smoking status: Heavy Tobacco Smoker    Types: Cigarettes  . Smokeless tobacco: None  . Alcohol Use: Yes     Comment: 40oz x4 daily    Review of Systems Constitutional: No fever/chills Eyes: No visual changes. ENT: No sore throat. Cardiovascular: Denies chest pain. Respiratory: Denies shortness of breath.States he is supposed to be using inhalers but does not have any. Gastrointestinal: No abdominal pain.  No nausea, no vomiting.  No diarrhea.  No constipation. Genitourinary: Negative for dysuria. Musculoskeletal: Negative for back pain. Skin: Negative for rash. Neurological: Negative for headaches, focal weakness or numbness.  10-point ROS otherwise negative.  ____________________________________________   PHYSICAL  EXAM:  VITAL SIGNS: ED Triage Vitals  Enc Vitals Group     BP 02/19/16 1219 157/88 mmHg     Pulse Rate 02/19/16 1219 71     Resp --      Temp 02/19/16 1219 98.5 F (36.9 C)     Temp Source 02/19/16 1219 Oral     SpO2 02/19/16 1219 98 %     Weight 02/19/16 1219 130 lb (58.968 kg)     Height --      Head Cir --      Peak Flow --      Pain Score 02/19/16 1216 0     Pain Loc --      Pain Edu? --      Excl. in [place]? --    Constitutional: Alert and oriented. Slightly disheveled in appearance. Chronically ill appearing. Eyes: Conjunctivae are normal. PERRL. EOMI. Head: Atraumatic. Nose: No congestion/rhinnorhea. Mouth/Throat: Mucous membranes are moist.  Oropharynx non-erythematous. Neck: No stridor.   Cardiovascular: Normal rate, regular rhythm. Grossly normal heart sounds.  Good peripheral circulation. Respiratory: Normal respiratory effort.  No retractions. Lungs CTAB. Gastrointestinal: Soft and nontender. No distention. Musculoskeletal: No lower extremity tenderness nor edema. Neurologic:  Normal speech and language except for some mild slurring of his speech. No gross focal neurologic deficits are appreciated. Skin:  Skin is warm, dry and intact. No rash noted. Psychiatric: Mood and affect are normal. Speech is slightly slurred, and behavior normal.  ____________________________________________   LABS (all labs ordered are listed, but only abnormal results are displayed)  Labs Reviewed  BASIC METABOLIC PANEL - Abnormal; Notable for the following:    Sodium 121 (*)    Chloride 88 (*)    Calcium 8.6 (*)    All other components within normal limits  ETHANOL - Abnormal; Notable for the following:    Alcohol, Ethyl (B) 141 (*)    All other components within normal limits  MAGNESIUM  CBC  CREATININE, SERUM  TSH  SODIUM, URINE, RANDOM  OSMOLALITY, URINE    ____________________________________________  EKG   ____________________________________________  RADIOLOGY   ____________________________________________   PROCEDURES  Procedure(s) performed: None  Critical Care performed: No  ____________________________________________   INITIAL IMPRESSION / ASSESSMENT AND PLAN / ED COURSE  Pertinent labs & imaging results that were available during my care of the patient were reviewed by me and considered in my medical decision making (see chart for details).  ----------------------------------------- 1:57 PM on 02/19/2016 -----------------------------------------  Patient sodium result at 121. The patient also got up out of bed and was wondering slightly seeming a little bit confused need to be reoriented in the ER. 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   Patient will be admitted for treatment of his hyponatremia, also alcoholism. ____________________________________________   FINAL CLINICAL IMPRESSION(S) / ED DIAGNOSES  Final diagnoses:  Hyponatremia  Drinks beer  Confusion      Delman Kitten, MD 02/19/16 1400

## 2016-02-19 NOTE — H&P (Addendum)
[place] [facility] [place] NAME: Thomas [name]    MR#:  [id number]  DATE OF BIRTH:  13-Jan-1951   DATE OF ADMISSION:  02/19/2016  PRIMARY CARE PHYSICIAN: [facility]   REQUESTING/REFERRING PHYSICIAN: Qual[place]  CHIEF COMPLAINT:   Chief Complaint  Patient presents with  . Alcohol Intoxication    HISTORY OF PRESENT ILLNESS:  Depriest Gahan  is a 65 y.o. male with a known history of Chronic alcohol abuse, essential hypertension who originally is presenting for "think get some help". As he said this he was holding a beer bottle in his hands actively drinking on EMS arrival. He states that he drinks beer extensively, he has not been eating or drinking other liquids. In the past he has had withdrawal symptoms. Routine lab work revealed hyponatremia Patient is actually intoxicated right now  PAST MEDICAL HISTORY:   Past Medical History  Diagnosis Date  . Hypertension   . 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 Enlarged prostate   . Tongue cancer ([place])   . Renal disorder   . Lung infection     no lung cancer  . Radiation     to neck  . Status post chemotherapy   . Skin cancer     PAST SURGICAL HISTORY:   Past Surgical History  Procedure Laterality Date  . Cancerous lymph nodes removed from neck    . Knee surgery Right   . Skin biopsy      SOCIAL HISTORY:   Social History  Substance Use Topics  . Smoking status: Heavy Tobacco Smoker    Types: Cigarettes  . Smokeless tobacco: Not on file  . Alcohol Use: Yes     Comment: 40oz x4 daily    FAMILY HISTORY:   Family History  Problem Relation Age of Onset  . Hypertension Mother     DRUG ALLERGIES:  No Known Allergies  REVIEW OF SYSTEMS:  REVIEW OF SYSTEMS: Unreliable CONSTITUTIONAL: Denies fevers, chills, fatigue, weakness.  EYES: Denies blurred vision, double vision, or eye pain.  EARS, NOSE, THROAT: Denies tinnitus, ear pain, hearing loss.  RESPIRATORY: denies cough,  shortness of breath, wheezing  CARDIOVASCULAR: Denies chest pain, palpitations, edema.  GASTROINTESTINAL: Denies nausea, vomiting, diarrhea, abdominal pain.  GENITOURINARY: Denies dysuria, hematuria.  ENDOCRINE: Denies nocturia or thyroid problems. HEMATOLOGIC AND LYMPHATIC: Denies easy bruising or bleeding.  SKIN: Denies rash or lesions.  MUSCULOSKELETAL: Denies pain in neck, back, shoulder, knees, hips, or further arthritic symptoms.  NEUROLOGIC: Denies paralysis, paresthesias.  PSYCHIATRIC: Denies anxiety or depressive symptoms. Otherwise full review of systems performed by me is negative.   MEDICATIONS AT HOME:   Prior to Admission medications   Medication Sig Start Date End Date Taking? Authorizing Provider  amLODipine (NORVASC) 5 MG tablet Take 1 tablet (5 mg total) by mouth daily. 02/09/16  Yes [name], MD  cloNIDine (CATAPRES) 0.1 MG tablet Take 1 tablet (0.1 mg total) by mouth 2 (two) times daily. 02/09/16  Yes [name], MD  Fluticasone-Salmeterol (ADVAIR DISKUS) 250-50 MCG/DOSE AEPB Inhale 1 puff into the lungs 2 (two) times daily. 01/25/16  Yes [name], MD  levothyroxine (SYNTHROID, LEVOTHROID) 50 MCG tablet Take 1 tablet (50 mcg total) by mouth daily before breakfast. 02/09/16  Yes [name], MD  metoprolol (LOPRESSOR) 50 MG tablet Take 1 tablet (50 mg total) by mouth 2 (two) times daily. 02/09/16  Yes [name], MD  mirtazapine (REMERON) 7.5 MG tablet Take 1 tablet (  7.5 mg total) by mouth at bedtime. 02/09/16  Yes [name], MD  Multiple Vitamin (MULTIVITAMIN WITH MINERALS) TABS tablet Take 1 tablet by mouth daily. 02/09/16  Yes [name], MD  tiotropium (SPIRIVA) 18 MCG inhalation capsule Place 1 capsule (18 mcg total) into inhaler and inhale daily. 01/25/16  Yes [name], MD      VITAL SIGNS:  Blood pressure 157/88, pulse 71, temperature 98.5 F (36.9 C), temperature source Oral, weight 130 lb (58.968 kg), SpO2 98  %.  PHYSICAL EXAMINATION:  VITAL SIGNS: Filed Vitals:   02/19/16 1219  BP: 157/88  Pulse: 71  Temp: 98.5 F (36.9 C)   GENERAL:64 y.o.male currently in no acute distress.  HEAD: Normocephalic, atraumatic.  EYES: Pupils equal, round, reactive to light. Extraocular muscles intact. No scleral icterus.  MOUTH: Moist mucosal membrane. Dentition intact. No abscess noted.  EAR, NOSE, THROAT: Clear without exudates. No external lesions.  NECK: Supple. No thyromegaly. No nodules. No JVD.  PULMONARY: Clear to ascultation, without wheeze rails or rhonci. No use of accessory muscles, Good respiratory effort. good air entry bilaterally CHEST: Nontender to palpation.  CARDIOVASCULAR: S1 and S2. Regular rate and rhythm. No murmurs, rubs, or gallops. No edema. Pedal pulses 2+ bilaterally.  GASTROINTESTINAL: Soft, nontender, nondistended. No masses. Positive bowel sounds. No hepatosplenomegaly.  MUSCULOSKELETAL: No swelling, clubbing, or edema. Range of motion full in all extremities.  NEUROLOGIC: Cranial nerves II through XII are intact. No gross focal neurological deficits. Sensation intact. Reflexes intact.  SKIN: No ulceration, lesions, rashes, or cyanosis. Skin warm and dry. Turgor intact.  PSYCHIATRIC: Mood, affect . Intoxicated, slurred speech. The patient is awake, alert and oriented x 3. Insight, judgment poor.    LABORATORY PANEL:   CBC No results for input(s): WBC, HGB, HCT, PLT in the last 168 hours. ------------------------------------------------------------------------------------------------------------------  Chemistries   Recent Labs Lab 02/19/16 1219  NA 121*  K 4.8  CL 88*  CO2 24  GLUCOSE 91  BUN 7  CREATININE 0.93  CALCIUM 8.6*  MG 1.9   ------------------------------------------------------------------------------------------------------------------  Cardiac Enzymes No results for input(s): TROPONINI in the last 168  hours. ------------------------------------------------------------------------------------------------------------------  RADIOLOGY:  No results found.  EKG:   Orders placed or performed during the hospital encounter of 02/09/16  . ED EKG  . ED EKG  . ED EKG  . ED EKG  . EKG 12-Lead  . EKG 12-Lead    IMPRESSION AND PLAN:   65 year old Caucasian gentleman with history of alcohol abuse hypothyroidism essential hypertension found to have low sodium  1. Hyponatremia: Original sodium 121, IV fluid hydration follow sodium levels check TSH urine sodium and urine osmolality, in all likelihood related to beer protomania  2. Essential hypertension: Catapres metoprolol Norvasc 3. Hypothyroidism unspecified check TSH as above continue Synthroid 4. COPD unspecified. spiriva 5. Alcohol abuse: ciwa   All the records are reviewed and case discussed with ED provider. Management plans discussed with the patient, family and they are in agreement.  CODE STATUS: Full  TOTAL TIME TAKING CARE OF THIS PATIENT: 33 minutes.    Hower,  Karenann Cai.D on 02/19/2016 at 2:15 PM  Between 7am to 6pm - Pager - [phone]  After 6pm: House Pager: - [phone]  [place]  Office  [phone]  CC: Primary care physician; [place]

## 2016-02-19 NOTE — ED Notes (Signed)
Pt arrived by EMS from a home where he stays with friends. One of the friends called EMS. Per EMS pt has been drinking for a month and now wants help. Pt told RN that he also hasn't been eating.

## 2016-02-20 LAB — BASIC METABOLIC PANEL
ANION GAP: 3 — AB (ref 5–15)
BUN: 11 mg/dL (ref 6–20)
CHLORIDE: 96 mmol/L — AB (ref 101–111)
CO2: 29 mmol/L (ref 22–32)
Calcium: 8.3 mg/dL — ABNORMAL LOW (ref 8.9–10.3)
Creatinine, Ser: 1.07 mg/dL (ref 0.61–1.24)
GFR calc non Af Amer: >60 mL/min (ref >60)
GLUCOSE: 95 mg/dL (ref 65–99)
Potassium: 4.2 mmol/L (ref 3.5–5.1)
Sodium: 128 mmol/L — ABNORMAL LOW (ref 135–145)

## 2016-02-20 LAB — CBC
HCT: 30.6 % — ABNORMAL LOW (ref 40.0–52.0)
HEMOGLOBIN: 10.7 g/dL — AB (ref 13.0–18.0)
MCH: 31.3 pg (ref 26.0–34.0)
MCHC: 34.8 g/dL (ref 32.0–36.0)
MCV: 89.7 fL (ref 80.0–100.0)
Platelets: 247 10*3/uL (ref 150–440)
RBC: 3.41 MIL/uL — AB (ref 4.40–5.90)
RDW: 17.8 % — ABNORMAL HIGH (ref 11.5–14.5)
WBC: 5.8 10*3/uL (ref 3.8–10.6)

## 2016-02-20 MED ORDER — NICOTINE 21 MG/24HR TD PT24
21.0000 mg | MEDICATED_PATCH | Freq: Every day | TRANSDERMAL | Status: DC
  Administered 2016-02-20 – 2016-02-22 (×3): 21 mg via TRANSDERMAL
  Filled 2016-02-20 (×3): qty 1

## 2016-02-20 NOTE — Clinical Social Work Note (Signed)
Clinical Social Work Assessment  Patient Details  Name: Thomas [name] MRN: [id number] Date of Birth: [birth year]  Date of referral:  02/20/16               Reason for consult:  Substance Use/ETOH Abuse, Community Resources, Discharge Planning                Permission sought to share information with:    Permission granted to share information::  No (Patient was detoxing and received sedating medication)  Name::     No family to contact   Agency::     Relationship::     Contact Information:   Patient was unable to remember his childrens names or contact information   Housing/Transportation Living arrangements for the past 2 months:  Homeless Source of Information:  Patient Patient Interpreter Needed:  None Criminal Activity/Legal Involvement Pertinent to Current Situation/Hospitalization:  No - Comment as needed Significant Relationships:  Adult Children, None, Friend Lives with:  Friends Do you feel safe going back to the place where you live?  No (Ill drink again if I go back) Need for family participation in patient care:  No (Coment)  Care giving concerns: patient states he lives with a friend but he will drink again if he goes there.    Social Worker assessment / plan: LCSW met with patient but it was challenging to collect information to do this assessment. He reports he has been drinking and smoking for over 50 years and the only sober time he accomplished was 5 years. He has been married and divorced 2 times and has children and grandchildren and was unable to tell me their names or where they are or live. He receives 750.00 month and [facility] . LCSW discussed his housing situation and provided him with a [place] Engineer, technical sales, Industrial/product designer, medication clinic, AA meetings handout,and meal program handout. LCSW reviewed he could go to [facility] for outpatient substance abuse treatment. Patient was falling in and out of sleep.  He is hard of hearing and  had slurred speech. He has medicaid and [employer], he was at ALF but left shortly after placed there. LCSW did leave a ALF/FCH list and asked patient to consider this safer living option. He will consider it, Conversation ended. Patient wished to rest.  Employment status:  Disabled (Comment on whether or not currently receiving Disability) (Recieves 750.00 month in [place]) Insurance information:  Medicare, Medicaid In [place] PT Recommendations:   na Information / Referral to community resources:  Residential Substance Abuse Treatment Options, Shelter, Outpatient Substance Abuse Treatment Options, Support Groups  Patient/Family's Response to care:  Good attitude  Patient/Family's Understanding of and Emotional Response to Diagnosis, Current Treatment, and Prognosis: unknown- he understands that drinking is a problem.  Emotional Assessment Appearance:  Appears stated age Attitude/Demeanor/Rapport:  Sedated Affect (typically observed):   (Polite) Orientation:  Oriented to Self, Oriented to Place, Oriented to  Time, Oriented to Situation Alcohol / Substance use:  Alcohol Use, Tobacco Use Psych involvement (Current and /or in the community):  No (Comment)  Discharge Needs  Concerns to be addressed:  Basic Needs, Medication Concerns, Homelessness Readmission within the last 30 days:  No Current discharge risk:  Substance Abuse, Homeless Barriers to Discharge:  Active Substance Use   Joana Reamer, LCSW 02/20/2016, 4:35 PM

## 2016-02-20 NOTE — Progress Notes (Signed)
[place] [facility] [place] NAME: Thomas [name]    MR#:  [id number]  DATE OF BIRTH:  09-Oct-1950  SUBJECTIVE:  CHIEF COMPLAINT:   Chief Complaint  Patient presents with  . Alcohol Intoxication   - Multiple admissions for the same. Patient came in with hyponatremia and weakness. Continues to drink alcohol as outpatient. Homeless.  REVIEW OF SYSTEMS:  Review of Systems  Constitutional: Positive for malaise/fatigue. Negative for fever and chills.  HENT: Positive for hearing loss. Negative for ear discharge, ear pain and nosebleeds.   Eyes: Negative for blurred vision and double vision.  Respiratory: Positive for shortness of breath. Negative for cough and wheezing.   Cardiovascular: Negative for chest pain, palpitations and leg swelling.  Gastrointestinal: Negative for nausea, vomiting, abdominal pain, diarrhea and constipation.  Genitourinary: Negative for dysuria and urgency.  Musculoskeletal: Positive for myalgias. Negative for neck pain.  Neurological: Negative for dizziness, sensory change, speech change, focal weakness, seizures and headaches.  Psychiatric/Behavioral: Negative for depression.    DRUG ALLERGIES:  No Known Allergies  VITALS:  Blood pressure 118/55, pulse 56, temperature 98.5 F (36.9 C), temperature source Oral, resp. rate 18, height 5\' 5"  (1.651 m), weight 59.92 kg (132 lb 1.6 oz), SpO2 99 %.  PHYSICAL EXAMINATION:  Physical Exam  GENERAL: 65 y.o.-year-old disshelved patient sitting in the bed.  EYES: Pupils equal, round, reactive to light and accommodation. No scleral icterus. Extraocular muscles intact.  HEENT: Head atraumatic, normocephalic. Oropharynx and nasopharynx clear.  NECK: Supple, no jugular venous distention. No thyroid enlargement, no tenderness.  LUNGS: Normal breath sounds bilaterally, no wheezing, rales,rhonchi or crepitation. No use of accessory muscles of respiration. Decreased bibasilar  breath sounds CARDIOVASCULAR: S1, S2 normal. No murmurs, rubs, or gallops.  ABDOMEN: Soft, nontender, nondistended. Bowel sounds present. No organomegaly or mass.  EXTREMITIES: No pedal edema, cyanosis, or clubbing.  NEUROLOGIC: Cranial nerves II through XII are intact. Muscle strength 5/5 in all extremities. Sensation intact. Gait not checked.  PSYCHIATRIC: The patient is alert and oriented x 3.  SKIN: Dry skin noted on both legs. No rash noted. Chronic hyperpigmentation from eczema noted    LABORATORY PANEL:   CBC  Recent Labs Lab 02/20/16 0451  WBC 5.8  HGB 10.7*  HCT 30.6*  PLT 247   ------------------------------------------------------------------------------------------------------------------  Chemistries   Recent Labs Lab 02/19/16 1219 02/20/16 0451  NA 121* 128*  K 4.8 4.2  CL 88* 96*  CO2 24 29  GLUCOSE 91 95  BUN 7 11  CREATININE 0.93 1.07  CALCIUM 8.6* 8.3*  MG 1.9  --    ------------------------------------------------------------------------------------------------------------------  Cardiac Enzymes No results for input(s): TROPONINI in the last 168 hours. ------------------------------------------------------------------------------------------------------------------  RADIOLOGY:  No results found.  EKG:   Orders placed or performed during the hospital encounter of 02/09/16  . ED EKG  . ED EKG  . ED EKG  . ED EKG  . EKG 12-Lead  . EKG 12-Lead    ASSESSMENT AND PLAN:   64y/o M with PMH of alcohol use, HTN presents to the hospital secondary to secondary to confusion and noted to have hyponatremia.  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  #2 Hypothyroidism- continue synthroid  #3 Alcohol abuse- CIWA  protocol in hospital, Improved, advised  against alcohol again  -Smoking-counseled again and started on nicotine patch  #4 hypertension-on metoprolol, clonidine and norvasc. 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 Need to follow-up with [facility] after discharge  #6 insomnia- restarted Remeron at 7.5 mg at bedtime  #7 DVT prophylaxis-Lovenox  Social worker consult tomorrow  All the records are reviewed and case discussed with Care Management/Social Workerr. Management plans discussed with the patient, family and they are in agreement.  CODE STATUS: Full Code  TOTAL TIME TAKING CARE OF THIS PATIENT: 33 minutes.   POSSIBLE D/C IN 2 DAYS, DEPENDING ON CLINICAL CONDITION.   [name] M.D on 02/20/2016 at 1:34 PM  Between 7am to 6pm - Pager - [phone]  After 6pm go to [email or web address] - password [email or web address] [facility]  [place] [facility]  Office  [phone]  CC: Primary care physician; [place]

## 2016-02-20 NOTE — Care Management Important Message (Signed)
Important Message  Patient Details  Name: FRITZGERALD DECANDIA MRN: [id number] Date of Birth: 24-Jul-1951   Medicare Important Message Given:  Yes    Eldene Plocher A, RN 02/20/2016, 3:25 PM

## 2016-02-20 NOTE — NC FL2 (Signed)
[place] LEVEL OF CARE SCREENING TOOL     IDENTIFICATION  Patient Name: Thomas [name] Birthdate: Aug 11, 1950 Sex: male Admission Date (Current Location): 02/19/2016  [facility] and [place] Number:  Engineering geologist and Address:  [facility], [address], [place], [place] [address]      Provider Number: [id number]  Attending Physician Name and Address:  [name], MD  Relative Name and Phone Number:       Current Level of Care: Hospital Recommended Level of Care: [place], [place] Prior Approval Number:    Date Approved/Denied:   PASRR Number:    Discharge Plan: Other (Comment) (Shelter)    Current Diagnoses: Patient Active Problem List   Diagnosis Date Noted  . Protein-calorie malnutrition, severe 01/22/2016  . Alcohol use disorder, severe, dependence ([place]) 12/12/2014  . Hyponatremia 12/12/2014  . Essential hypertension 12/12/2014  . COPD (chronic obstructive pulmonary disease) ([place]) 12/12/2014    Orientation RESPIRATION BLADDER Height & Weight     Self, Time, Situation, Place  Normal Continent Weight: 132 lb 1.6 oz (59.92 kg) Height:  5\' 5"  (165.1 cm)  BEHAVIORAL SYMPTOMS/MOOD NEUROLOGICAL BOWEL NUTRITION STATUS      Continent Diet (Normal)  AMBULATORY STATUS COMMUNICATION OF NEEDS Skin   Independent Verbally Normal                       Personal Care Assistance Level of Assistance    Bathing Assistance: Independent Feeding assistance: Independent Dressing Assistance: Independent     Functional Limitations Info    Sight Info: Adequate Hearing Info: Impaired (Hard of hearing) Speech Info: Impaired (Slurred)    SPECIAL CARE FACTORS FREQUENCY                       Contractures      Additional Factors Info  Code Status Code Status Info: full code             Current Medications (02/20/2016):  This is the current hospital active medication list Current  Facility-Administered Medications  Medication Dose Route Frequency Provider Last Rate Last Dose  . acetaminophen (TYLENOL) tablet 650 mg  650 mg Oral Q6H PRN Lytle Butte, MD       Or  . acetaminophen (TYLENOL) suppository 650 mg  650 mg Rectal Q6H PRN Lytle Butte, MD      . amLODipine (NORVASC) tablet 5 mg  5 mg Oral Daily Lytle Butte, MD   5 mg at 02/20/16 1113  . cloNIDine (CATAPRES) tablet 0.1 mg  0.1 mg Oral BID Lytle Butte, MD   0.1 mg at 02/20/16 1113  . enoxaparin (LOVENOX) injection 40 mg  40 mg Subcutaneous Q24H Lytle Butte, MD   40 mg at 02/20/16 1400  . folic acid (FOLVITE) tablet 1 mg  1 mg Oral Daily Lytle Butte, MD   1 mg at 02/20/16 1113  . ipratropium-albuterol (DUONEB) 0.5-2.5 (3) MG/3ML nebulizer solution 3 mL  3 mL Nebulization Q4H PRN Delman Kitten, MD      . levothyroxine (SYNTHROID, LEVOTHROID) tablet 50 mcg  50 mcg Oral QAC breakfast Lytle Butte, MD   50 mcg at 02/20/16 0834  . LORazepam (ATIVAN) tablet 1 mg  1 mg Oral Q6H PRN Lytle Butte, MD       Or  . LORazepam (ATIVAN) injection 1 mg  1 mg Intravenous Q6H PRN Lytle Butte, MD      .  LORazepam (ATIVAN) tablet 0-4 mg  0-4 mg Oral Q6H Lytle Butte, MD   2 mg at 02/20/16 1400   Followed by  . [START ON 02/21/2016] LORazepam (ATIVAN) tablet 0-4 mg  0-4 mg Oral Q12H Lytle Butte, MD      . metoprolol (LOPRESSOR) tablet 50 mg  50 mg Oral BID Lytle Butte, MD   50 mg at 02/20/16 1113  . mirtazapine (REMERON) tablet 7.5 mg  7.5 mg Oral QHS Lytle Butte, MD   7.5 mg at 02/19/16 2331  . mometasone-formoterol (DULERA) 200-5 MCG/ACT inhaler 2 puff  2 puff Inhalation BID Lytle Butte, MD   2 puff at 02/20/16 [phone]  . multivitamin with minerals tablet 1 tablet  1 tablet Oral Daily Lytle Butte, MD   1 tablet at 02/20/16 1113  . nicotine (NICODERM CQ - dosed in mg/24 hours) patch 21 mg  21 mg Transdermal Daily [name], MD   21 mg at 02/20/16 1505  . ondansetron (ZOFRAN) tablet 4 mg  4 mg Oral Q6H PRN Lytle Butte, MD       Or  . ondansetron [facility]) injection 4 mg  4 mg Intravenous Q6H PRN Lytle Butte, MD   4 mg at 02/19/16 2018  . thiamine (VITAMIN B-1) tablet 100 mg  100 mg Oral Daily Lytle Butte, MD   100 mg at 02/20/16 1113   Or  . thiamine (B-1) injection 100 mg  100 mg Intravenous Daily Lytle Butte, MD      . tiotropium [facility]) inhalation capsule 18 mcg  18 mcg Inhalation Daily Lytle Butte, MD   18 mcg at 02/20/16 1000     Discharge Medications: Please see discharge summary for a list of discharge medications.  Relevant Imaging Results:  Relevant Lab Results:   Additional Information SSN [id number]  Joana Reamer, [place]

## 2016-02-21 LAB — BASIC METABOLIC PANEL
ANION GAP: 5 (ref 5–15)
BUN: 15 mg/dL (ref 6–20)
CALCIUM: 8.6 mg/dL — AB (ref 8.9–10.3)
CO2: 29 mmol/L (ref 22–32)
CREATININE: 1.17 mg/dL (ref 0.61–1.24)
Chloride: 93 mmol/L — ABNORMAL LOW (ref 101–111)
GFR calc Af Amer: >60 mL/min (ref >60)
GFR calc non Af Amer: >60 mL/min (ref >60)
GLUCOSE: 83 mg/dL (ref 65–99)
Potassium: 3.9 mmol/L (ref 3.5–5.1)
Sodium: 127 mmol/L — ABNORMAL LOW (ref 135–145)

## 2016-02-21 MED ORDER — DOCUSATE SODIUM 100 MG PO CAPS
100.0000 mg | ORAL_CAPSULE | Freq: Two times a day (BID) | ORAL | Status: DC
  Administered 2016-02-21 – 2016-02-22 (×3): 100 mg via ORAL
  Filled 2016-02-21 (×3): qty 1

## 2016-02-21 MED ORDER — ENSURE ENLIVE PO LIQD
237.0000 mL | Freq: Two times a day (BID) | ORAL | Status: DC
  Administered 2016-02-21 – 2016-02-22 (×3): 237 mL via ORAL

## 2016-02-21 MED ORDER — MAGNESIUM HYDROXIDE 400 MG/5ML PO SUSP
30.0000 mL | Freq: Every day | ORAL | Status: DC | PRN
  Administered 2016-02-21: 15:00:00 30 mL via ORAL
  Filled 2016-02-21: qty 30

## 2016-02-21 NOTE — Progress Notes (Signed)
Initial Nutrition Assessment  DOCUMENTATION CODES:   Severe malnutrition in context of social or environmental circumstances  INTERVENTION:   Cater to pt preferences, recommend dysphagia III diet order if having trouble chewing Recommend Ensure Enlive po BID, each supplement provides 350 kcal and 20 grams of protein   NUTRITION DIAGNOSIS:   Malnutrition related to social / environmental circumstances as evidenced by energy intake < or equal to 50% for > or equal to 1 month, severe depletion of body fat, severe depletion of muscle mass.  GOAL:   Patient will meet greater than or equal to 90% of their needs  MONITOR:   PO intake, Supplement acceptance, Labs, Weight trends, I & O's  REASON FOR ASSESSMENT:   Consult Assessment of nutrition requirement/status  ASSESSMENT:   Pt admitted with hyponatremia and EtOH use, currently on CIWA. Pt with last admission 2 weeks ago for the same.  Past Medical History  Diagnosis Date  . Hypertension   . 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 Enlarged prostate   . Tongue cancer ([place])   . Renal disorder   . Lung infection     no lung cancer  . Radiation     to neck  . Status post chemotherapy   . Skin cancer     Diet Order:  Diet Heart Room service appropriate?: Yes; Fluid consistency:: Thin   Pt very sleepy on visit mumbled that he ate this am. RN reports pt ate a little this am but reported to her he didn't have an appetite.   Pt homeless PTA, with EtOH use. This is the third admission in the past month, whereupon pt has been admitted for EtOH use and hyponatremia. On previous admissions pt reported to writer that he is drinking and not eating for weeks at a time. Pt reported drinking and not eating for 10-14 days on first admission in June and then reported the same on second admission on 02/07/2016, two weeks ago. RD notes pt last drink was when he was picked up by EMS on admission.    Medications: Folic acid, MVI, Remeron, thiamine,  colace Labs: Na 127, glucose 83   Gastrointestinal Profile: Last BM:  02/18/2016   Nutrition-Focused Physical Exam Findings: Nutrition-Focused physical exam completed. Findings are moderate-severe fat depletion, moderate-severe muscle depletion, and no edema.     Weight Change: RD measured pt on visit at 127.7lbs. RD notes pt weight 134lbs June 2017 (4.7% weight loss in one month)   Skin:  Reviewed, no issues   Height:   Ht Readings from Last 1 Encounters:  02/21/16 5\' 5"  (1.651 m)    Weight:   Wt Readings from Last 1 Encounters:  02/21/16 127 lb 11.2 oz (57.924 kg)   Wt Readings from Last 10 Encounters:  02/21/16 127 lb 11.2 oz (57.924 kg)  02/09/16 130 lb (58.968 kg)  02/09/16 130 lb (58.968 kg)  01/26/16 134 lb 9.6 oz (61.054 kg)  08/17/15 136 lb 1.6 oz (61.735 kg)  01/01/15 130 lb (58.968 kg)  12/12/14 122 lb 8 oz (55.566 kg)  06/02/14 121 lb 7 oz (55.084 kg)    BMI:  Body mass index is 21.25 kg/(m^2).  Estimated Nutritional Needs:   Kcal:  1710-1955kcals  Protein:  65-86g protein  Fluid:  >/= 1.8L fluid  EDUCATION NEEDS:   Education needs no appropriate at this time  Dwyane Luo, RD, LDN Pager [phone] Weekend/On-Call Pager [phone]

## 2016-02-21 NOTE — Progress Notes (Signed)
Patient is alert and oriented x 4, denies pain, resting in between care, ambulated with physical therapy, on room air, ciwa scale q6h and managed appropriately, likely to d/c on 7/18, sodium serum at 128.

## 2016-02-21 NOTE — Progress Notes (Signed)
[place] at [place] NAME: Thomas [name]    MR#:  [id number]  DATE OF BIRTH:  Jul 17, 1951  SUBJECTIVE:  CHIEF COMPLAINT:   Chief Complaint  Patient presents with  . Alcohol Intoxication   - Complaints of constipation today. Feels much better. Sodium down to 127  REVIEW OF SYSTEMS:  Review of Systems  Constitutional: Negative for fever, chills and malaise/fatigue.  HENT: Positive for hearing loss. Negative for ear discharge, ear pain and nosebleeds.   Eyes: Negative for blurred vision, double vision and discharge.  Respiratory: Negative for cough, shortness of breath and wheezing.   Cardiovascular: Negative for chest pain, palpitations and leg swelling.  Gastrointestinal: Positive for constipation. Negative for nausea, vomiting, abdominal pain and diarrhea.  Genitourinary: Negative for dysuria and urgency.  Musculoskeletal: Negative for myalgias and neck pain.  Neurological: Negative for dizziness, sensory change, speech change, focal weakness, seizures and headaches.  Psychiatric/Behavioral: Negative for depression.    DRUG ALLERGIES:  No Known Allergies  VITALS:  Blood pressure 124/60, pulse 69, temperature 97.7 F (36.5 C), temperature source Oral, resp. rate 20, height 5\' 5"  (1.651 m), weight 57.924 kg (127 lb 11.2 oz), SpO2 99 %.  PHYSICAL EXAMINATION:  Physical Exam  GENERAL: 65 y.o.-year-old disshelved patient sitting in the bed.  EYES: Pupils equal, round, reactive to light and accommodation. No scleral icterus. Extraocular muscles intact.  HEENT: Head atraumatic, normocephalic. Oropharynx and nasopharynx clear.  NECK: Supple, no jugular venous distention. No thyroid enlargement, no tenderness.  LUNGS: Normal breath sounds bilaterally, no wheezing, rales,rhonchi or crepitation. No use of accessory muscles of respiration. Decreased bibasilar breath sounds CARDIOVASCULAR: S1, S2 normal. No murmurs, rubs, or gallops.   ABDOMEN: Soft, nontender, nondistended. Bowel sounds present. No organomegaly or mass.  EXTREMITIES: No pedal edema, cyanosis, or clubbing.  NEUROLOGIC: Cranial nerves II through XII are intact. Muscle strength 5/5 in all extremities. Sensation intact. Gait not checked.  PSYCHIATRIC: The patient is alert and oriented x 3.  SKIN: Dry skin noted on both legs. No rash noted. Chronic hyperpigmentation from eczema noted    LABORATORY PANEL:   CBC  Recent Labs Lab 02/20/16 0451  WBC 5.8  HGB 10.7*  HCT 30.6*  PLT 247   ------------------------------------------------------------------------------------------------------------------  Chemistries   Recent Labs Lab 02/19/16 1219  02/21/16 0620  NA 121*  < > 127*  K 4.8  < > 3.9  CL 88*  < > 93*  CO2 24  < > 29  GLUCOSE 91  < > 83  BUN 7  < > 15  CREATININE 0.93  < > 1.17  CALCIUM 8.6*  < > 8.6*  MG 1.9  --   --   < > = values in this interval not displayed. ------------------------------------------------------------------------------------------------------------------  Cardiac Enzymes No results for input(s): TROPONINI in the last 168 hours. ------------------------------------------------------------------------------------------------------------------  RADIOLOGY:  No results found.  EKG:   Orders placed or performed during the hospital encounter of 02/09/16  . ED EKG  . ED EKG  . ED EKG  . ED EKG  . EKG 12-Lead  . EKG 12-Lead    ASSESSMENT AND PLAN:   65y/o M with PMH of alcohol use, HTN presents to the hospital secondary to secondary to confusion and noted to have hyponatremia.  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  #2 Hypothyroidism- continue synthroid  #3  Alcohol abuse- CIWA protocol in hospital, Improved, advised against alcohol again  -Smoking-counseled again and started on nicotine patch  #4 hypertension-on metoprolol, clonidine and norvasc. 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 Need to follow-up with [facility] after discharge  #6 insomnia- restarted Remeron at 7.5 mg at bedtime  #7 DVT prophylaxis-Lovenox  Social worker consult for homelessness  Discharge tomorrow.  All the records are reviewed and case discussed with Care Management/Social Workerr. Management plans discussed with the patient, family and they are in agreement.  CODE STATUS: Full Code  TOTAL TIME TAKING CARE OF THIS PATIENT: 33 minutes.   POSSIBLE D/C TOMORROW, DEPENDING ON CLINICAL CONDITION.   [name] M.D on 02/21/2016 at 2:52 PM  Between 7am to 6pm - Pager - [phone]  After 6pm go to [email or web address] - password [email or web address] [facility]  [facility]  Office  [phone]  CC: Primary care physician; [place]

## 2016-02-21 NOTE — Evaluation (Signed)
Physical Therapy Evaluation Patient Details Name: Thomas [name] MRN: [id number] DOB: 1951-03-03 Today's Date: 02/21/2016   History of Present Illness  Pt is a 65 yr old male presenting to hospital seeking detox and admitted for hyponatremia.  PMH includes HTN, CA of the nasal cavities and tongue, COPD, and ETOH abuse.    Clinical Impression  Prior to admission, pt was independent with all functional mobility and ADLs, though he endorses a fairly sedentary lifestyle. Pt reports history of 5-6 falls in the past 6 months, all related to ETOH abuse.  Pt is homeless, and stays with friends inconsistently (unable to describe a true home set-up).  Currently pt is mod I for bed mobility, and supervision for sit <> stand transfers and ambulation x 2ft with RW.  Pt was able to transfer/ambulate without AD, but demonstrates greater stability, fluidity, and confidence with RW. Pt would benefit from skilled PT to address noted impairments and functional limitations.  Recommend pt discharge home with HHPT and RW when medically appropriate.     Follow Up Recommendations Home health PT    Equipment Recommendations  Rolling walker with 5" wheels    Recommendations for Other Services       Precautions / Restrictions Precautions Precautions: Fall Restrictions Weight Bearing Restrictions: No      Mobility  Bed Mobility Overal bed mobility: Modified Independent Bed Mobility: Supine to Sit Supine to sit: Modified independent (Device/Increase time);HOB elevated General bed mobility comments: HOB elevated and use of bed rails  Transfers Overall transfer level: Needs assistance Equipment used: Rolling walker (2 wheeled);None Transfers: Sit to/from Stand (x 3 trials) Sit to Stand: Supervision General transfer comment: Pt able to complete 2 sit <> stand transfers without AD; 3rd with RW as this was safer for ambulation. Good technique; no cueing or physical assist  required.  Ambulation/Gait Ambulation/Gait assistance: Supervision Ambulation Distance (Feet): 10 + 80 Feet Assistive device: Rolling walker (2 wheeled);None Gait Pattern/deviations: Step-through pattern;Decreased step length - right;Decreased step length - left;Shuffle Gait velocity: decreased General Gait Details: Pt ambulated x21ft, reaching out for bed rails/countertop for added stability. Pt sat back down and then ambulated 1 x 26ft with RW, demonstrating increased stability and fluidity of gait. Decreased step length resulting in decreased cadence, but otherwise no significant deviations or LOB noted.  Stairs     Wheelchair Mobility    Modified Rankin (Stroke Patients Only)       Balance Overall balance assessment: Needs assistance Sitting-balance support: Feet supported Sitting balance-Leahy Scale: Good     Standing balance support: Bilateral upper extremity supported (on RW) Standing balance-Leahy Scale: Fair      Pertinent Vitals/Pain Pain Assessment: 0-10 Pain Score: 6  Pain Location: Headache Pain Intervention(s): Limited activity within patient's tolerance;Monitored during session;Repositioned    Home Living Family/patient expects to be discharged to:: Unsure Additional Comments: Pt endorses homelessness and staying with friends inconsistently.     Prior Function Level of Independence: Independent  Comments: Pt reports "not getting around much"        Extremity/Trunk Assessment   Upper Extremity Assessment: Generalized weakness   Lower Extremity Assessment: Generalized weakness Hip Flexion: R 4-/5    L 3+/5 Knee Flexion: R 3+/5    L 3+/5 Knee Extension: R 3+/5    L 3+/5 Ankle Plantarflexion: R 4-/5    L 4-/5 Ankle Dorsiflexion: R 4-/5    L 4-/5 Tingling and itching sensation in stocking pattern BLE, though does appreciate light touch.   Cervical / Trunk  Assessment: Normal    Communication   Communication: Expressive difficulties ([place];  difficult to understand)  Cognition Arousal/Alertness: Awake/alert Behavior During Therapy: WFL for tasks assessed/performed Overall Cognitive Status: Within Functional Limits for tasks assessed    General Comments General comments (skin integrity, edema, etc.): Pt with skin discoloration at R foot/ankle in a sock-like fashion.     Exercises General Exercises - Lower Extremity Ankle Circles/Pumps: AROM Quad Sets: AROM Short Arc Quad: AROM Heel Slides: AROM Hip ABduction/ADduction: AROM Straight Leg Raises: AROM  All exercises performed bilaterally x 10 reps in supine.      Assessment/Plan    PT Assessment Patient needs continued PT services  PT Diagnosis Generalized weakness   PT Problem List Decreased strength;Decreased balance;Decreased mobility;Decreased knowledge of use of DME;Decreased safety awareness  PT Treatment Interventions DME instruction;Gait training;Functional mobility training;Therapeutic exercise;Therapeutic activities;Balance training;Patient/family education   PT Goals (Current goals can be found in the Care Plan section) Acute Rehab PT Goals Patient Stated Goal: to go home PT Goal Formulation: With patient Time For Goal Achievement: 03/06/16 Potential to Achieve Goals: Good    Frequency Min 2X/week   Barriers to discharge           End of Session Equipment Utilized During Treatment: Gait belt Activity Tolerance: Patient tolerated treatment well Patient left: in chair;with call bell/phone within reach;with chair alarm set Nurse Communication: Mobility status         Time: [id number] PT Time Calculation (min) (ACUTE ONLY): 27 min   Charges:         PT G Codes:        Thomas [name], SPT 02/21/2016, 4:02 PM

## 2016-02-22 ENCOUNTER — Inpatient Hospital Stay: Payer: Medicare Other

## 2016-02-22 LAB — BASIC METABOLIC PANEL
ANION GAP: 5 (ref 5–15)
BUN: 17 mg/dL (ref 6–20)
CHLORIDE: 94 mmol/L — AB (ref 101–111)
CO2: 28 mmol/L (ref 22–32)
Calcium: 8.7 mg/dL — ABNORMAL LOW (ref 8.9–10.3)
Creatinine, Ser: 1.48 mg/dL — ABNORMAL HIGH (ref 0.61–1.24)
GFR, EST AFRICAN AMERICAN: 56 mL/min — AB (ref >60)
GFR, EST NON AFRICAN AMERICAN: 48 mL/min — AB (ref >60)
Glucose, Bld: 113 mg/dL — ABNORMAL HIGH (ref 65–99)
POTASSIUM: 4.5 mmol/L (ref 3.5–5.1)
SODIUM: 127 mmol/L — AB (ref 135–145)

## 2016-02-22 NOTE — Clinical Social Work Note (Signed)
CSW spoke with pt as he is ready for discharge. Pt has requested the facility where he was previously. Pt was placed at [facility] for [place] in June. CSW spoke with admissions coordinator, who will review referral. CSW also left a message for pt's daughter requesting a return phone call. CSW will continue to follow.   Darden Dates, MSW, LCSW  Clinical Social Worker  [phone]

## 2016-02-22 NOTE — Progress Notes (Signed)
Patient transported by EMS in stretcher to [facility]

## 2016-02-22 NOTE — Clinical Social Work Placement (Signed)
   CLINICAL SOCIAL WORK PLACEMENT  NOTE  Date:  02/22/2016  Patient Details  Name: Thomas [name] MRN: [id number] Date of Birth: Jun 26, 1951  Clinical Social Work is seeking post-discharge placement for this patient at the [place] level of care (*CSW will initial, date and re-position this form in  chart as items are completed):  Yes   Patient/family provided with [place] Work Department's list of facilities offering this level of care within the geographic area requested by the patient (or if unable, by the patient's family).  Yes   Patient/family informed of their freedom to choose among providers that offer the needed level of care, that participate in Medicare, Medicaid or managed care program needed by the patient, have an available bed and are willing to accept the patient.      Patient/family informed of [facility]'s ownership interest in [facility] and [facility], as well as of the fact that they are under no obligation to receive care at these facilities.  PASRR submitted to EDS on       PASRR number received on       Existing PASRR number confirmed on 02/22/16     FL2 transmitted to all facilities in geographic area requested by pt/family on 02/22/16     FL2 transmitted to all facilities within larger geographic area on       Patient informed that his/her managed care company has contracts with or will negotiate with certain facilities, including the following:        Yes   Patient/family informed of bed offers received.  Patient chooses bed at [facility]     Physician recommends and patient chooses bed at      Patient to be transferred to [facility] on 02/22/16.  Patient to be transferred to facility by [facility]     Patient family notified on 02/22/16 of transfer.  Name of family member notified:  Pt's daughter, Langley Gauss     PHYSICIAN       Additional Comment:     _______________________________________________ Darden Dates, LCSW 02/22/2016, 2:47 PM

## 2016-02-22 NOTE — Care Management Note (Signed)
Case Management Note  Patient Details  Name: Thomas [name] MRN: [id number] Date of Birth: 1951-01-24  Subjective/Objective:      Provided with a RW by Corene Cornea from [employer].              Action/Plan:   Expected Discharge Date:                  Expected Discharge Plan:     In-House Referral:     Discharge planning Services     Post Acute Care Choice:    Choice offered to:     DME Arranged:    DME Agency:     HH Arranged:    HH Agency:     Status of Service:     If discussed at [facility] of Stay Meetings, dates discussed:    Additional Comments:  Linkyn Gobin A, RN 02/22/2016, 10:47 AM

## 2016-02-22 NOTE — Clinical Social Work Note (Signed)
Pt is ready for discharge today and will go to [facility] as they are able to admit him. Pt will likely use his Medicaid benefit. CSW explained the Medicaid process. Pt is agreeable to discharge plan. Pt is grateful for discharge plan. CSW also spoke with pt's daughter, who is in agreement as well. RN will call report to facility. [facility] will provide transportation. Pt and daughter are aware that insurance that may not cover transportation. CSW is signing off as no further needs identified.   Darden Dates, MSW, LCSW  Clinical Social Worker  [phone]

## 2016-02-22 NOTE — Discharge Summary (Signed)
[place] at [place] NAME: Thomas [name]    MR#:  [id number]  DATE OF BIRTH:  06-29-1951  DATE OF ADMISSION:  02/19/2016 ADMITTING PHYSICIAN: Lytle Butte, MD  DATE OF DISCHARGE: 02/22/2016  PRIMARY CARE PHYSICIAN: [facility]    ADMISSION DIAGNOSIS:  Confusion [R41.0] Hyponatremia [E87.1] Drinks beer [Z78.9]  DISCHARGE DIAGNOSIS:  Active Problems:   Hyponatremia   SECONDARY DIAGNOSIS:   Past Medical History  Diagnosis Date  . Hypertension   . Cancer of nasal cavities ([place])   . Enlarged prostate   . Tongue cancer ([place])   . Renal disorder   . Lung infection     no lung cancer  . Radiation     to neck  . Status post chemotherapy   . Skin cancer     HOSPITAL COURSE:   65y/o M with PMH of alcohol use, HTN presents to the hospital secondary to secondary to confusion and noted to have hyponatremia.  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 - avoid alcohol use.  #2 Hypothyroidism- continue synthroid  #3 Alcohol abuse- CIWA protocol in hospital, Improved, advised against alcohol again  -Smoking-counseled again and has been on nicotine patch here. Seems motivated to quit this time.  #4 hypertension-on metoprolol, clonidine and norvasc. 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 Need to follow-up with [facility] after discharge  #6 insomnia- restarted Remeron at 7.5 mg at bedtime  Discharge to  [place] [facility] today.  DISCHARGE CONDITIONS:    Guarded  CONSULTS OBTAINED:  Treatment Team:  Lytle Butte, MD  DRUG ALLERGIES:  No Known Allergies  DISCHARGE MEDICATIONS:   Current Discharge Medication List    CONTINUE these medications which have NOT CHANGED   Details  amLODipine (NORVASC) 5 MG tablet Take 1 tablet (5 mg total) by mouth daily. Qty: 30 tablet, Refills: 2    cloNIDine (CATAPRES) 0.1 MG tablet Take 1 tablet (0.1 mg total) by mouth 2 (two) times daily. Qty: 60 tablet, Refills: 2    Fluticasone-Salmeterol (ADVAIR DISKUS) 250-50 MCG/DOSE AEPB Inhale 1 puff into the lungs 2 (two) times daily. Qty: 60 each, Refills: 1    levothyroxine (SYNTHROID, LEVOTHROID) 50 MCG tablet Take 1 tablet (50 mcg total) by mouth daily before breakfast. Qty: 30 tablet, Refills: 2    metoprolol (LOPRESSOR) 50 MG tablet Take 1 tablet (50 mg total) by mouth 2 (two) times daily. Qty: 60 tablet, Refills: 2    mirtazapine (REMERON) 7.5 MG tablet Take 1 tablet (7.5 mg total) by mouth at bedtime. Qty: 30 tablet, Refills: 2    Multiple Vitamin (MULTIVITAMIN WITH MINERALS) TABS tablet Take 1 tablet by mouth daily. Qty: 30 tablet, Refills: 2    tiotropium (SPIRIVA) 18 MCG inhalation capsule Place 1 capsule (18 mcg total) into inhaler and inhale daily. Qty: 30 capsule, Refills: 2         DISCHARGE INSTRUCTIONS:   1. PCP f/u in 1-2 weeks 2.Fluid restriction to 1200cc/day only  If you experience worsening of your admission symptoms, develop shortness of breath, life threatening emergency,  suicidal or homicidal thoughts you must seek medical attention immediately by calling 911 or calling your MD immediately  if symptoms less severe.  You Must read complete instructions/literature along with all the possible adverse reactions/side effects for all the Medicines you take and that have been prescribed to you. Take any new Medicines after you have completely understood and accept all the possible adverse reactions/side effects.    Please note  You were cared for by a hospitalist during your hospital stay. If you have any questions about your discharge medications or the care you received while you were in the hospital after you are discharged, you can call the unit and asked to speak with the hospitalist on call if the hospitalist that took care of you is not available. Once you are discharged, your primary care physician will handle any further medical issues. Please note that NO REFILLS for any discharge medications will be authorized once you are discharged, as it is imperative that you return to your primary care physician (or establish a relationship with a primary care physician if you do not have one) for your aftercare needs so that they can reassess your need for medications and monitor your lab values.    Today   CHIEF COMPLAINT:   Chief Complaint  Patient presents with  . Alcohol Intoxication    VITAL SIGNS:  Blood pressure 115/62, pulse 70, temperature 98.9 F (37.2 C), temperature source Oral, resp. rate 18, height 5\' 5"  (1.651 m), weight 57.924 kg (127 lb 11.2 oz), SpO2 99 %.  I/O:   Intake/Output Summary (Last 24 hours) at 02/22/16 1244 Last data filed at 02/22/16 0920  Gross per 24 hour  Intake    480 ml  Output      0 ml  Net    480 ml    PHYSICAL EXAMINATION:   Physical Exam  GENERAL: 65 y.o.-year-old disshelved patient sitting in the bed.  EYES: Pupils equal, round, reactive to light and accommodation. No scleral icterus. Extraocular muscles intact.  HEENT: Head atraumatic, normocephalic. Oropharynx and nasopharynx clear.  NECK: Supple, no jugular venous distention. No thyroid enlargement, no tenderness.  LUNGS: Normal breath sounds bilaterally, no wheezing, rales,rhonchi or crepitation. No use of accessory muscles of respiration. Decreased bibasilar breath sounds CARDIOVASCULAR: S1, S2 normal. No murmurs, rubs, or gallops.  ABDOMEN: Soft, nontender, nondistended. Bowel  sounds present. No organomegaly or mass.  EXTREMITIES: No pedal edema, cyanosis, or clubbing.  NEUROLOGIC: Cranial nerves II through XII are intact. Muscle strength 5/5 in all extremities. Sensation intact. Gait not checked.  PSYCHIATRIC: The patient is alert and oriented x 3.  SKIN: Dry skin noted on both legs. No rash noted. Chronic hyperpigmentation from eczema noted    DATA REVIEW:   CBC  Recent Labs Lab 02/20/16 0451  WBC 5.8  HGB 10.7*  HCT 30.6*  PLT 247    Chemistries   Recent Labs Lab 02/19/16 1219  02/22/16 0454  NA 121*  < > 127*  K 4.8  < > 4.5  CL 88*  < > 94*  CO2 24  < > 28  GLUCOSE 91  < > 113*  BUN 7  < > 17  CREATININE 0.93  < > 1.48*  CALCIUM 8.6*  < > 8.7*  MG 1.9  --   --   < > = values in this interval not displayed.  Cardiac Enzymes No results for input(s): TROPONINI in the last 168 hours.  Microbiology Results  Results for orders  placed or performed during the hospital encounter of 01/22/16  CULTURE, BLOOD (ROUTINE X 2) w Reflex to ID Panel     Status: None   Collection Time: 01/24/16  3:15 PM  Result Value Ref Range Status   Specimen Description BLOOD RIGHT A  Final   Special Requests   Final    BOTTLES DRAWN AEROBIC AND ANAEROBIC AER 10ML ANA 5ML   Culture NO GROWTH 5 DAYS  Final   Report Status 01/29/2016 FINAL  Final  CULTURE, BLOOD (ROUTINE X 2) w Reflex to ID Panel     Status: None   Collection Time: 01/24/16  3:15 PM  Result Value Ref Range Status   Specimen Description BLOOD LEFT A  Final   Special Requests   Final    BOTTLES DRAWN AEROBIC AND ANAEROBIC AER 10ML ANA 9ML   Culture NO GROWTH 5 DAYS  Final   Report Status 01/29/2016 FINAL  Final    RADIOLOGY:  Dg Foot Complete Left  02/22/2016  CLINICAL DATA:  Patient with heel pain. Initial encounter. No traumatic history. EXAM: LEFT FOOT - COMPLETE 3+ VIEW COMPARISON:  Foot radiograph 11/02/2010 FINDINGS: Hallux valgus deformity. First MTP joint degenerative changes.  Vascular calcifications. Midfoot degenerative changes. Plantar calcaneal spur. No evidence for acute fracture or dislocation. IMPRESSION: Plantar calcaneal spurring. 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 Hallux valgus deformity. Vascular calcifications. Electronically Signed   By: Lovey Newcomer M.D.   On: 02/22/2016 10:33    EKG:   Orders placed or performed during the hospital encounter of 02/09/16  . ED EKG  . ED EKG  . ED EKG  . ED EKG  . EKG 12-Lead  . EKG 12-Lead      Management plans discussed with the patient, family and they are in agreement.  CODE STATUS:     Code Status Orders        Start     Ordered   02/19/16 1355  Full code   Continuous     02/19/16 1355    Code Status History    Date Active Date Inactive Code Status Order ID Comments User Context   02/07/2016  2:30 AM 02/09/2016  6:59 PM Full Code [id number]  Harrie Foreman, MD Inpatient   01/22/2016  5:52 AM 01/26/2016  6:05 PM Full Code [id number]  Harrie Foreman, MD Inpatient   08/17/2015 10:29 PM 08/21/2015  6:00 PM Full Code [id number]  Lance Coon, MD Inpatient   12/12/2014  1:19 AM 12/18/2014  4:59 PM Full Code [id number]  Lytle Butte, MD ED      TOTAL TIME TAKING CARE OF THIS PATIENT: 37 minutes.    [name] M.D on 02/22/2016 at 12:44 PM  Between 7am to 6pm - Pager - [phone]  After 6pm go to [email or web address] - password [email or web address] [facility]  [facility]  Office  [phone]  CC: Primary care physician; [place]

## 2016-02-22 NOTE — Discharge Instructions (Signed)
1. FLUID RESTRICTION TO UP TO 1200cc/day ONLY

## 2016-02-22 NOTE — Care Management Note (Signed)
Case Management Note  Patient Details  Name: Thomas [name] MRN: [id number] Date of Birth: 23-May-1951  Subjective/Objective:         Asked Advanced to see whether [name] has had a RW within the past 5 years. [name] has Medicaid and Medicare which requires a home address. Home address list in his chart is [address], [place], [place] [address]. [name] always says he is homeless during each hospital admission. Discussed discharge disposition with [name] who says he does not know where we can discharge him to but he is " working on it."   Called home health PT order to [employer] at [facility] and suggested that the [employer] address be used unless [name] can provide another address before his discharge today.        Action/Plan:   Expected Discharge Date:                  Expected Discharge Plan:     In-House Referral:     Discharge planning Services     Post Acute Care Choice:    Choice offered to:     DME Arranged:    DME Agency:     HH Arranged:    HH Agency:     Status of Service:     If discussed at [employer] of Stay Meetings, dates discussed:    Additional Comments:  Lanyiah Brix A, RN 02/22/2016, 9:15 AM

## 2016-02-22 NOTE — Progress Notes (Signed)
Gave report to Thomas [name] at [facility], called EMS to pick up patient going to room 31-A at [facility]

## 2016-02-22 NOTE — NC FL2 (Signed)
[place] LEVEL OF CARE SCREENING TOOL     IDENTIFICATION  Patient Name: Thomas [name] Birthdate: 1951-07-27 Sex: male Admission Date (Current Location): 02/19/2016  [place] and [place] Number:  Engineering geologist and Address:  [facility], [address], [place], [place] [address]      Provider Number: [id number]  Attending Physician Name and Address:  [name], MD  Relative Name and Phone Number:       Current Level of Care: Hospital Recommended Level of Care: [place] Prior Approval Number:    Date Approved/Denied:   PASRR Number: [id number]  Discharge Plan: SNF    Current Diagnoses: Patient Active Problem List   Diagnosis Date Noted  . Protein-calorie malnutrition, severe 01/22/2016  . Alcohol use disorder, severe, dependence ([place]) 12/12/2014  . Hyponatremia 12/12/2014  . Essential hypertension 12/12/2014  . COPD (chronic obstructive pulmonary disease) ([place]) 12/12/2014    Orientation RESPIRATION BLADDER Height & Weight     Self, Time, Situation, Place  Normal Continent Weight: 127 lb 11.2 oz (57.924 kg) Height:  5\' 5"  (165.1 cm)  BEHAVIORAL SYMPTOMS/MOOD NEUROLOGICAL BOWEL NUTRITION STATUS      Continent Diet (Heart Healthy)  AMBULATORY STATUS COMMUNICATION OF NEEDS Skin   Limited Assist Verbally Normal                       Personal Care Assistance Level of Assistance  Bathing, Feeding, Dressing Bathing Assistance: Limited assistance Feeding assistance: Independent Dressing Assistance: Limited assistance     Functional Limitations Info  Sight, Hearing, Speech Sight Info: Impaired Hearing Info: Adequate Speech Info: Impaired    SPECIAL CARE FACTORS FREQUENCY                       Contractures      Additional Factors Info  Code Status, Allergies Code Status Info: Full Code Allergies Info: No known allergies           Current Medications (02/22/2016):  This is  the current hospital active medication list Current Facility-Administered Medications  Medication Dose Route Frequency Provider Last Rate Last Dose  . acetaminophen (TYLENOL) tablet 650 mg  650 mg Oral Q6H PRN Lytle Butte, MD       Or  . acetaminophen (TYLENOL) suppository 650 mg  650 mg Rectal Q6H PRN Lytle Butte, MD      . amLODipine (NORVASC) tablet 5 mg  5 mg Oral Daily Lytle Butte, MD   5 mg at 02/22/16 0853  . cloNIDine (CATAPRES) tablet 0.1 mg  0.1 mg Oral BID Lytle Butte, MD   0.1 mg at 02/22/16 [id number]  . docusate sodium (COLACE) capsule 100 mg  100 mg Oral BID [name], MD   100 mg at 02/22/16 0854  . enoxaparin (LOVENOX) injection 40 mg  40 mg Subcutaneous Q24H Lytle Butte, MD   40 mg at 02/21/16 1503  . feeding supplement (ENSURE ENLIVE) (ENSURE ENLIVE) liquid 237 mL  237 mL Oral BID BM [name], MD   237 mL at 02/22/16 1000  . folic acid (FOLVITE) tablet 1 mg  1 mg Oral Daily Lytle Butte, MD   1 mg at 02/22/16 0853  . ipratropium-albuterol (DUONEB) 0.5-2.5 (3) MG/3ML nebulizer solution 3 mL  3 mL Nebulization Q4H PRN Delman Kitten, MD      . levothyroxine (SYNTHROID, LEVOTHROID) tablet 50 mcg  50 mcg Oral QAC breakfast Aaron Mose  Hower, MD   50 mcg at 02/22/16 0853  . LORazepam (ATIVAN) tablet 1 mg  1 mg Oral Q6H PRN Lytle Butte, MD   1 mg at 02/22/16 0550   Or  . LORazepam (ATIVAN) injection 1 mg  1 mg Intravenous Q6H PRN Lytle Butte, MD      . magnesium hydroxide (MILK OF MAGNESIA) suspension 30 mL  30 mL Oral Daily PRN [name], MD   30 mL at 02/21/16 1503  . metoprolol (LOPRESSOR) tablet 50 mg  50 mg Oral BID Lytle Butte, MD   50 mg at 02/22/16 0853  . mirtazapine (REMERON) tablet 7.5 mg  7.5 mg Oral QHS Lytle Butte, MD   7.5 mg at 02/21/16 2109  . mometasone-formoterol (DULERA) 200-5 MCG/ACT inhaler 2 puff  2 puff Inhalation BID Lytle Butte, MD   2 puff at 02/22/16 0855  . multivitamin with minerals tablet 1 tablet  1 tablet Oral Daily  Lytle Butte, MD   1 tablet at 02/22/16 [phone]  . nicotine (NICODERM CQ - dosed in mg/24 hours) patch 21 mg  21 mg Transdermal Daily [name], MD   21 mg at 02/22/16 0854  . ondansetron (ZOFRAN) tablet 4 mg  4 mg Oral Q6H PRN Lytle Butte, MD       Or  . ondansetron [facility]) injection 4 mg  4 mg Intravenous Q6H PRN Lytle Butte, MD   4 mg at 02/19/16 2018  . thiamine (VITAMIN B-1) tablet 100 mg  100 mg Oral Daily Lytle Butte, MD   100 mg at 02/22/16 [id number]   Or  . thiamine (B-1) injection 100 mg  100 mg Intravenous Daily Lytle Butte, MD      . tiotropium [facility]) inhalation capsule 18 mcg  18 mcg Inhalation Daily Lytle Butte, MD   18 mcg at 02/22/16 [id number]     Discharge Medications: Please see discharge summary for a list of discharge medications.  Relevant Imaging Results:  Relevant Lab Results:   Additional Information SSN: [id number]  Darden Dates, LCSW

## 2016-11-23 ENCOUNTER — Encounter: Payer: Self-pay | Admitting: Emergency Medicine

## 2016-11-23 ENCOUNTER — Emergency Department
Admission: EM | Admit: 2016-11-23 | Discharge: 2016-11-23 | Disposition: A | Discharge status: Home / Self Care | Payer: Medicare Other | Attending: Emergency Medicine | Admitting: Emergency Medicine

## 2016-11-23 DIAGNOSIS — Z79899 Other long term (current) drug therapy: Secondary | ICD-10-CM | POA: Diagnosis not present

## 2016-11-23 DIAGNOSIS — Z85828 Personal history of other malignant neoplasm of skin: Secondary | ICD-10-CM | POA: Insufficient documentation

## 2016-11-23 DIAGNOSIS — J449 Chronic obstructive pulmonary disease, unspecified: Secondary | ICD-10-CM | POA: Insufficient documentation

## 2016-11-23 DIAGNOSIS — F10120 Alcohol abuse with intoxication, uncomplicated: Secondary | ICD-10-CM | POA: Diagnosis not present

## 2016-11-23 DIAGNOSIS — I10 Essential (primary) hypertension: Secondary | ICD-10-CM | POA: Diagnosis not present

## 2016-11-23 DIAGNOSIS — Z85810 Personal history of malignant neoplasm of tongue: Secondary | ICD-10-CM | POA: Diagnosis not present

## 2016-11-23 DIAGNOSIS — F17210 Nicotine dependence, cigarettes, uncomplicated: Secondary | ICD-10-CM | POA: Diagnosis not present

## 2016-11-23 DIAGNOSIS — Z8522 Personal history of malignant neoplasm of nasal cavities, middle ear, and accessory sinuses: Secondary | ICD-10-CM | POA: Diagnosis not present

## 2016-11-23 DIAGNOSIS — F1010 Alcohol abuse, uncomplicated: Secondary | ICD-10-CM

## 2016-11-23 DIAGNOSIS — F10920 Alcohol use, unspecified with intoxication, uncomplicated: Secondary | ICD-10-CM

## 2016-11-23 NOTE — ED Triage Notes (Addendum)
Pt to triage via w/c with no distress noted, brought in by [facility] PD officer; pt reports wanting detox from alcohol, last drink hr PTA; pt denies SI or HI

## 2016-11-23 NOTE — Discharge Instructions (Signed)
You were seen in the emergency department for alcohol intoxication.  We do not have the resources to provide detox services.  Please seek help from the recommended resources for assistance with your alcohol dependence.  If you have any thoughts of hurting herself or others, please call 911 or return to the emergency department.  Please avoid drug and alcohol use.  Never drive a vehicle or operate machinery while intoxicated.

## 2016-11-23 NOTE — ED Provider Notes (Signed)
-----------------------------------------   8:00 AM on 11/23/2016 -----------------------------------------   Blood pressure 130/77, pulse 77, temperature 97.9 F (36.6 C), temperature source Oral, resp. rate 18, height 5\' 7"  (1.702 m), weight 135 lb (61.2 kg), SpO2 98 %.  Assuming care from Dr. Karma Greaser of [facility] is a 66 y.o. male with a chief complaint of Alcohol Problem .    In summary, 66 y.o. male with a long history of alcohol abuse who presents for alcohol detox. Patient is now awake and clinically sober. Ambulating with steady gait, neuro intact, no complaints this morning. Tolerating PO. No longer wishes detox. Will be discharged at this time.    Rudene Re, MD 11/23/16 [phone]

## 2016-11-23 NOTE — ED Provider Notes (Signed)
[facility] Emergency Department Provider Note  ____________________________________________   First MD Initiated Contact with Patient 11/23/16 [phone]     (approximate)  I have reviewed the triage vital signs and the nursing notes.   HISTORY  Chief Complaint Alcohol Problem  Level 5 caveat:  history/ROS limited by acute intoxication  HPI Thomas [name] is a 66 y.o. male with a long history of alcohol abuse who presents for alcohol detox.  He is slurring his speech and obviously heavily intoxicated at this time.  Reportedly the [place] police brought him in and the patient was claiming he wanted detox.  He does not remember the police bring him in and states that he is "just tired of it".  A full review of systems is not possible at this time because he is sleepy and intoxicated but he denies chest pain and shortness of breath.  He says that nothing is new or different, he has been drinking for many years, and he thinks he should stop.  He has not had any seizures recently of which he is aware.  Past Medical History:  Diagnosis Date  . 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 Enlarged prostate   . Hypertension   . Lung infection    no lung cancer  . Radiation    to neck  . Renal disorder   . Skin cancer   . Status post chemotherapy   . Tongue cancer [facility])     Patient Active Problem List   Diagnosis Date Noted  . Protein-calorie malnutrition, severe 01/22/2016  . Alcohol use disorder, severe, dependence ([place]) 12/12/2014  . Hyponatremia 12/12/2014  . Essential hypertension 12/12/2014  . COPD (chronic obstructive pulmonary disease) ([place]) 12/12/2014    Past Surgical History:  Procedure Laterality Date  . cancerous lymph nodes removed from neck    . KNEE SURGERY Right   . SKIN BIOPSY      Prior to Admission medications   Medication Sig Start Date End Date Taking? Authorizing Provider  amLODipine (NORVASC) 5 MG tablet Take 1 tablet (5 mg total) by  mouth daily. 02/09/16   [name], MD  cloNIDine (CATAPRES) 0.1 MG tablet Take 1 tablet (0.1 mg total) by mouth 2 (two) times daily. 02/09/16   [name], MD  Fluticasone-Salmeterol (ADVAIR DISKUS) 250-50 MCG/DOSE AEPB Inhale 1 puff into the lungs 2 (two) times daily. 01/25/16   [name], MD  levothyroxine (SYNTHROID, LEVOTHROID) 50 MCG tablet Take 1 tablet (50 mcg total) by mouth daily before breakfast. 02/09/16   [name], MD  metoprolol (LOPRESSOR) 50 MG tablet Take 1 tablet (50 mg total) by mouth 2 (two) times daily. 02/09/16   [name], MD  mirtazapine (REMERON) 7.5 MG tablet Take 1 tablet (7.5 mg total) by mouth at bedtime. 02/09/16   [name], MD  Multiple Vitamin (MULTIVITAMIN WITH MINERALS) TABS tablet Take 1 tablet by mouth daily. 02/09/16   [name], MD  tiotropium (SPIRIVA) 18 MCG inhalation capsule Place 1 capsule (18 mcg total) into inhaler and inhale daily. 01/25/16   [name], MD    Allergies Patient has no known allergies.  Family History  Problem Relation Age of Onset  . Hypertension Mother     Social History Social History  Substance Use Topics  . Smoking status: Heavy Tobacco Smoker    Types: Cigarettes  . Smokeless tobacco: Never Used  . Alcohol use Yes     Comment: 40oz x4 daily    Review of  Systems Level 5 caveat:  history/ROS limited by acute intoxication ____________________________________________   PHYSICAL EXAM:  VITAL SIGNS: ED Triage Vitals  Enc Vitals Group     BP 11/23/16 0022 (!) 151/95     Pulse Rate 11/23/16 0022 81     Resp 11/23/16 0022 18     Temp 11/23/16 0022 97.9 F (36.6 C)     Temp Source 11/23/16 0022 Oral     SpO2 11/23/16 0022 99 %     Weight 11/23/16 0021 135 lb (61.2 kg)     Height 11/23/16 0021 5\' 7"  (1.702 m)     Head Circumference --      Peak Flow --      Pain Score --      Pain Loc --      Pain Edu? --      Excl. in [place]? --     Constitutional: Somnolent  and appears clinically intoxicated.  Extremely disheveled, malodorous, poor personal hygiene. Eyes: Conjunctivae are normal. PERRL. EOMI. Head: Atraumatic. Nose: No congestion/rhinnorhea.  Abnormal nares secondary to history of nasal cancer Mouth/Throat: Mucous membranes are moist. Cardiovascular: Normal rate, regular rhythm. Good peripheral circulation. Grossly normal heart sounds. Respiratory: Normal respiratory effort.  No retractions. Lungs CTAB. Gastrointestinal: Soft and nontender. No distention.  Musculoskeletal: No lower extremity tenderness nor edema. No gross deformities of extremities. Neurologic:  Slurred speech and language. No gross focal neurologic deficits are appreciated.  No tremor. Skin:  Skin is warm, dry and intact. No rash noted.   ____________________________________________   LABS (all labs ordered are listed, but only abnormal results are displayed)  Labs Reviewed - No data to display ____________________________________________  EKG  None - EKG not ordered by ED physician ____________________________________________  RADIOLOGY   No results found.  ____________________________________________   PROCEDURES  Critical Care performed: No   Procedure(s) performed:   Procedures   ____________________________________________   INITIAL IMPRESSION / ASSESSMENT AND PLAN / ED COURSE  Pertinent labs & imaging results that were available during my care of the patient were reviewed by me and considered in my medical decision making (see chart for details).  The patient does not qualify for placement at any of the local detox facilities.  He has no indication of complicated withdrawal and his vital signs are stable.  We will observe him until morning but I explained to him that we do not have the capacity to provide detox at this time and we can provide some outpatient resources.  After I told him this he said okay and went back to sleep.  We will allow  him to sleep here until he is clinically sober but there is no indication for any medical workup at this time.   Clinical Course as of Nov 23 712  Thu Nov 23, 2016  0651 Patient still sleeping and clinically intoxicated.  Signing out care to Dr. Alfred Levins for reassessment and discharge when clinically sober and/or has sober adult to accompany him.  [CF]    Clinical Course User Index [CF] Hinda Kehr, MD    ____________________________________________  FINAL CLINICAL IMPRESSION(S) / ED DIAGNOSES  Final diagnoses:  Alcoholic intoxication without complication ([place])  Alcohol abuse     MEDICATIONS GIVEN DURING THIS VISIT:  Medications - No data to display   NEW OUTPATIENT MEDICATIONS STARTED DURING THIS VISIT:  New Prescriptions   No medications on file    Modified Medications   No medications on file    Discontinued Medications   No medications  on file     Note:  This document was prepared using Dragon voice recognition software and may include unintentional dictation errors.    Hinda Kehr, MD 11/23/16 [phone]

## 2017-03-06 IMAGING — CT CT HEAD W/O CM
3 series · 16 of 47 positions shown, 19 images · non-contrast
Comparison: 12/12/2014 02/19/2013

CLINICAL DATA: Acute onset of weakness.  Slurred speech.

EXAM:
CT HEAD WITHOUT CONTRAST
TECHNIQUE: Contiguous axial images were obtained from the base of the skull
through the vertex without intravenous contrast.

[Series 2: head wo · axial · 0.44mm/px · z∈[+430,+566]mm · 10 of 33 slices shown, 13 images]
[im 3/33  brain]
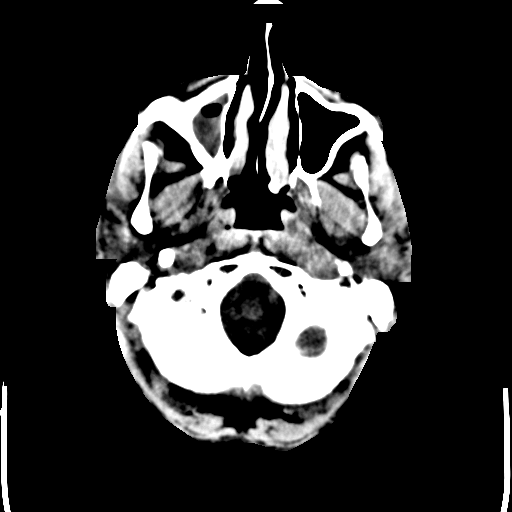
[im 3/33  bone]
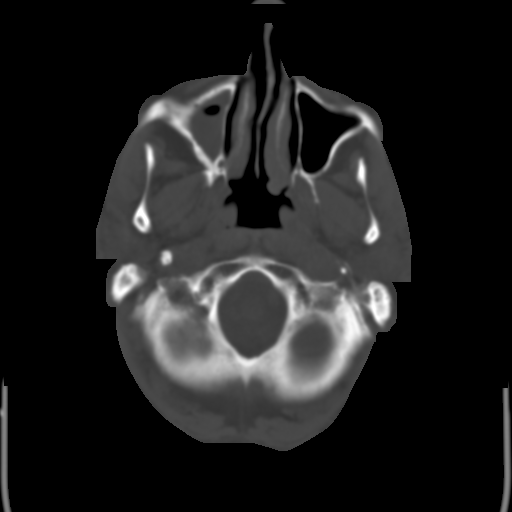
[im 6/33  brain]
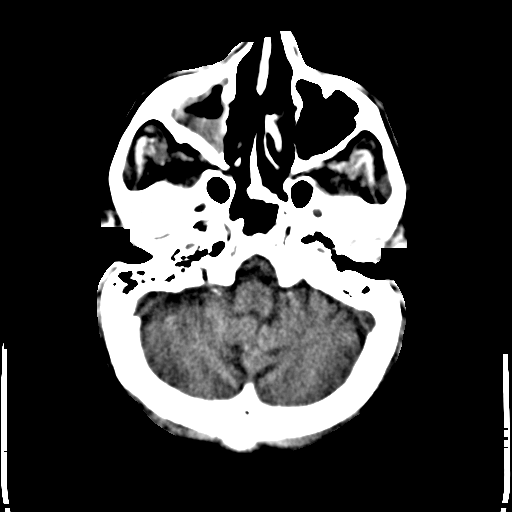
[im 9/33  brain]
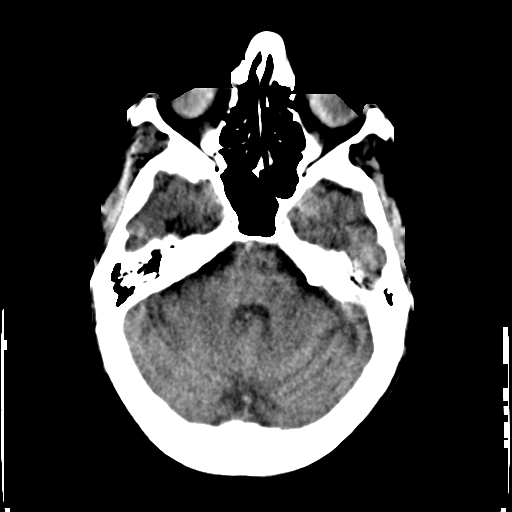
[im 12/33  brain]
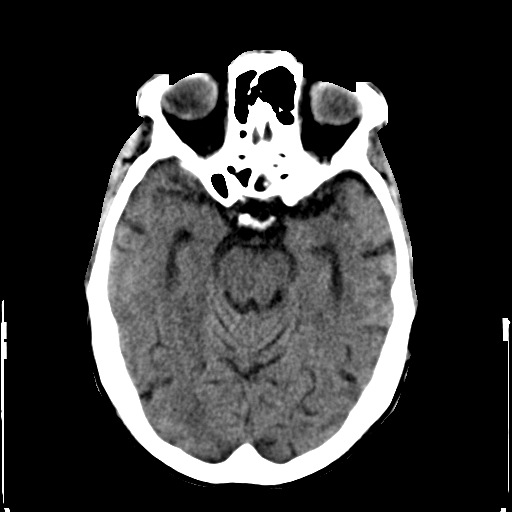
[im 15/33  brain]
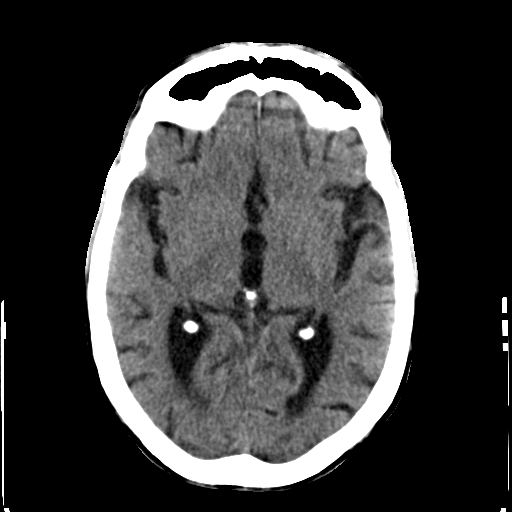
[im 15/33  bone]
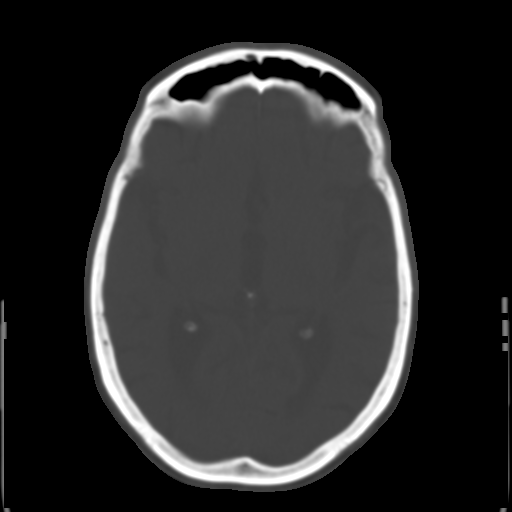
[im 18/33  brain]
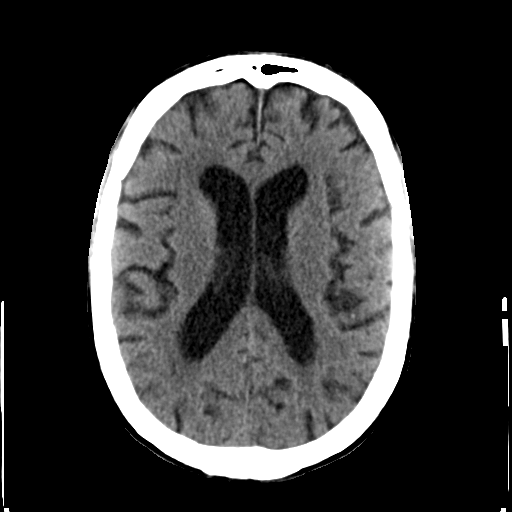
[im 21/33  brain]
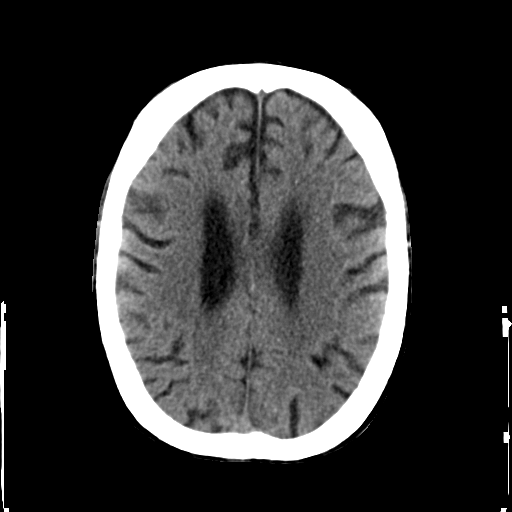
[im 25/33  brain]
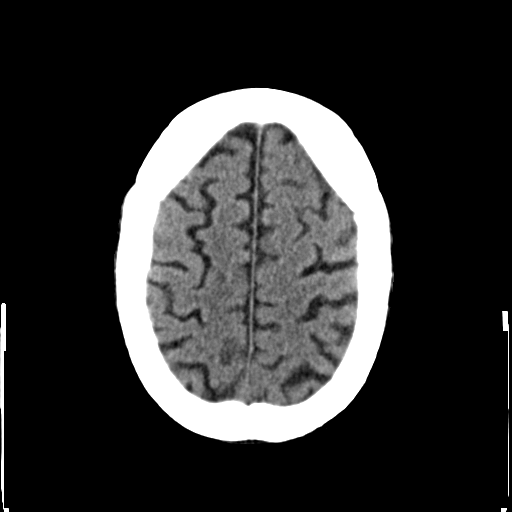
[im 27/33  brain]
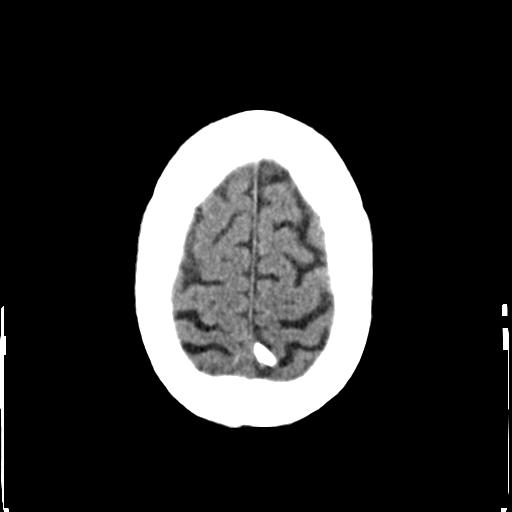
[im 27/33  bone]
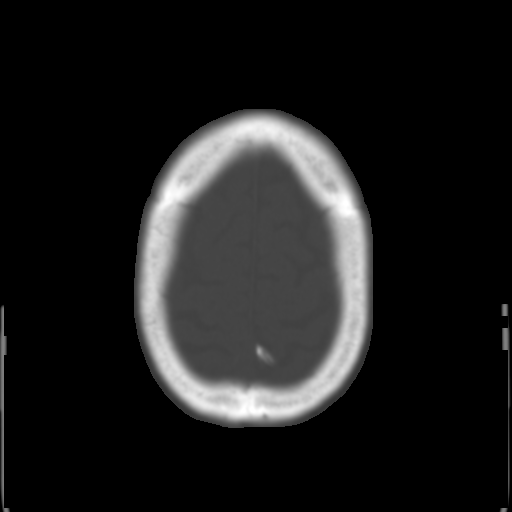
[im 30/33  brain]
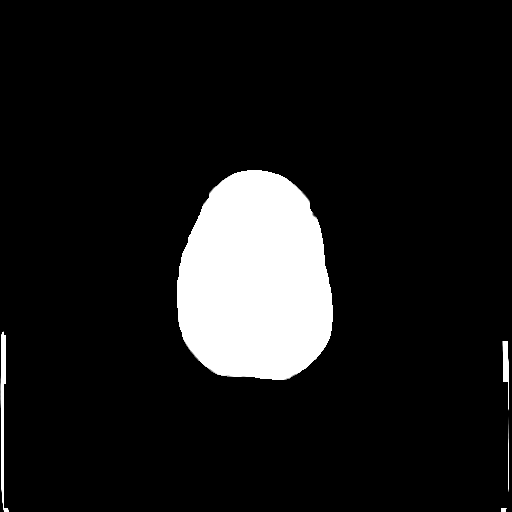

[Series 4: coronal soft tissue · coronal · 0.35mm/px · 3 of 67 slices shown]
[im 23/67  brain]
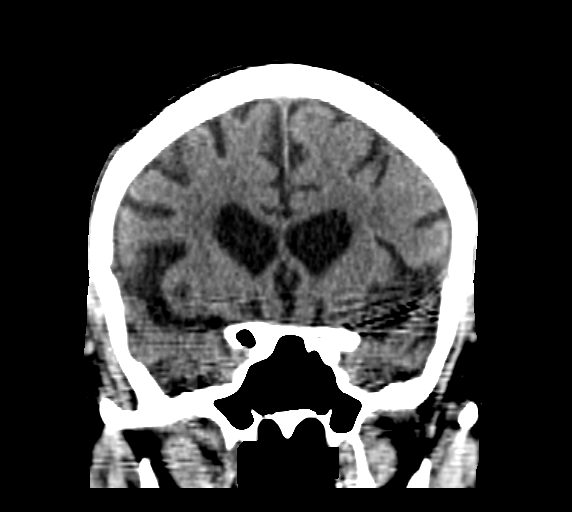
[im 30/67  brain]
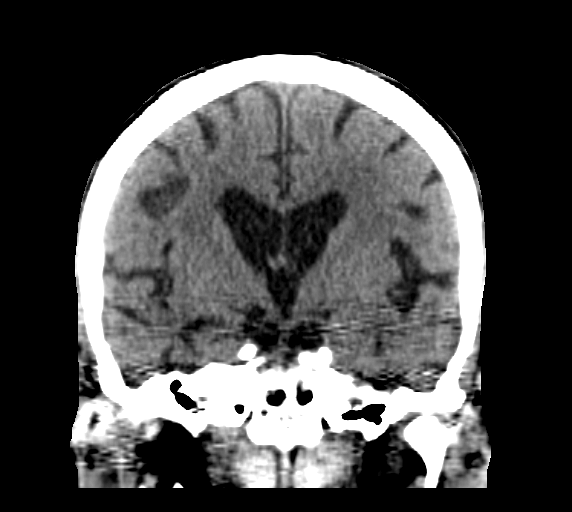
[im 37/67  brain]
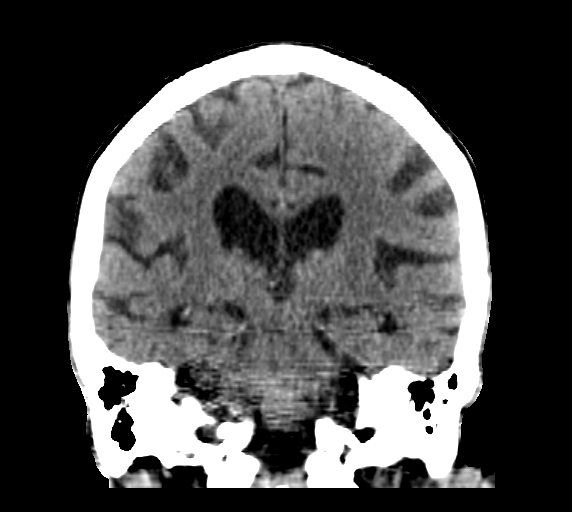

[Series 5: sagittal soft tissue · sagittal · 0.34mm/px · 3 of 49 slices shown]
[im 17/49  brain]
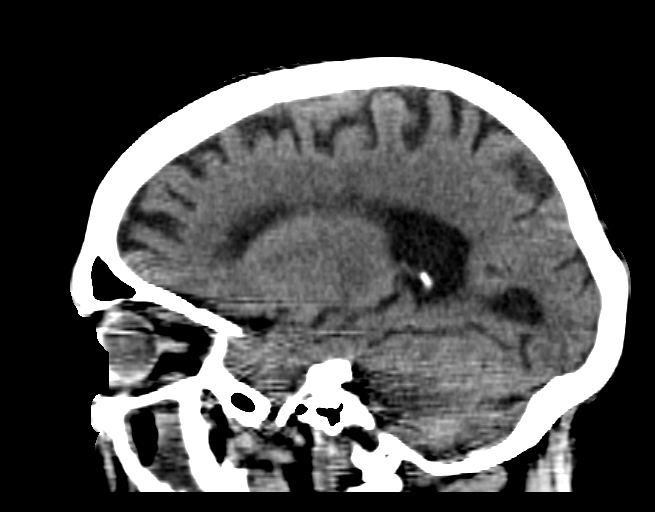
[im 25/49  brain]
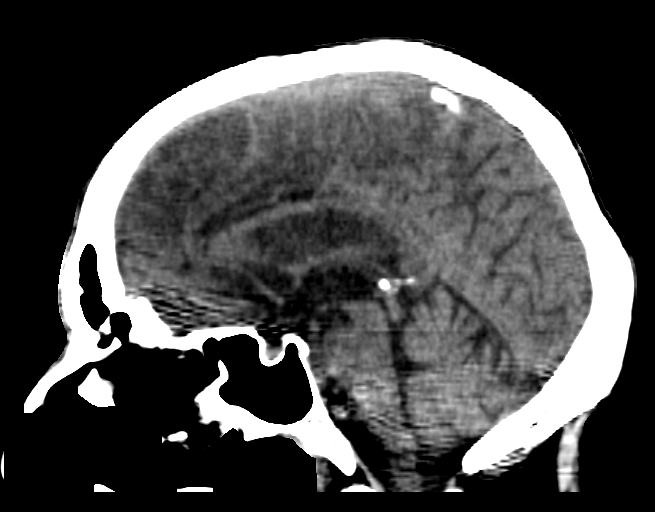
[im 33/49  brain]
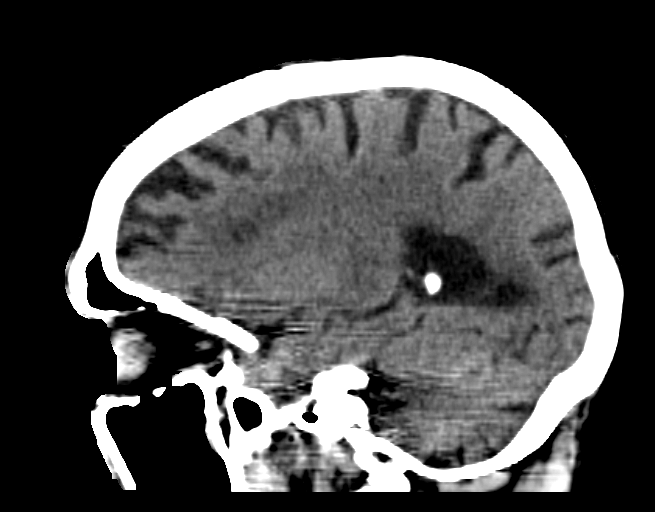

[16 of 47 positions shown; findings below may reference images not displayed]

FINDINGS: The brain shows generalized atrophy. No evidence of old or acute
focal infarction, mass lesion, hemorrhage, hydrocephalus or
extra-axial collection. There is right maxillary sinus inflammation.
Other sinuses are clear. No fluid in the mastoids. There is
atherosclerotic calcification of the major vessels at the base of
the brain.
IMPRESSION: Brain atrophy.  No focal or acute finding.

Right maxillary sinusitis. This was also present on the study of
02/19/2013.

## 2017-03-19 IMAGING — DX DG FOOT COMPLETE 3+V*L*
3 series · 3 of 3 positions shown · non-contrast
Comparison: Foot radiograph 11/02/2010

CLINICAL DATA: Patient with heel pain. Initial encounter. No
traumatic history.

EXAM:
LEFT FOOT - COMPLETE 3+ VIEW

[foot ap]
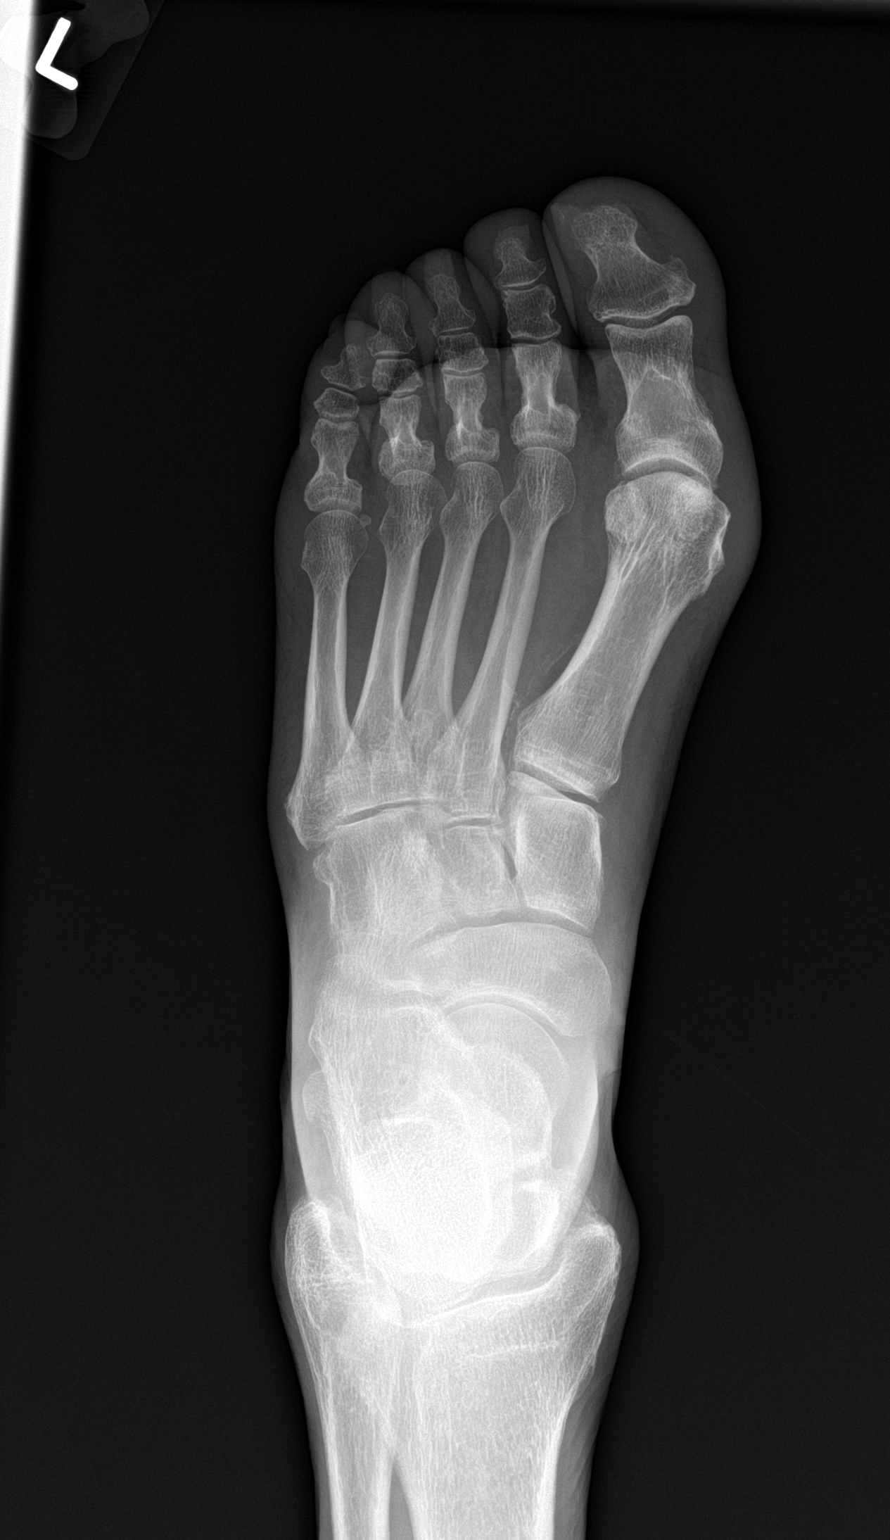

[foot obl]
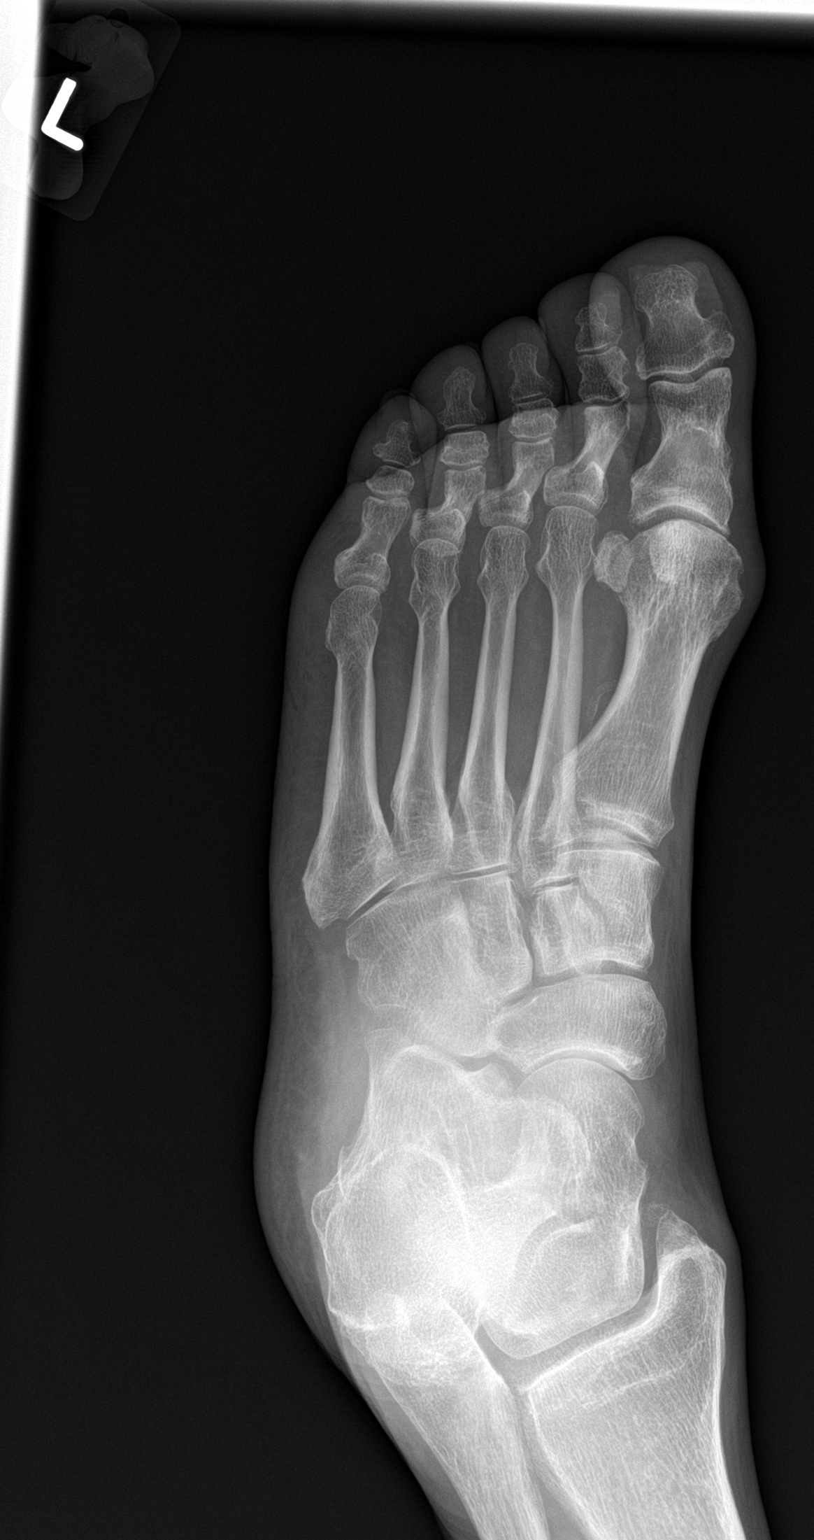

[foot lat]
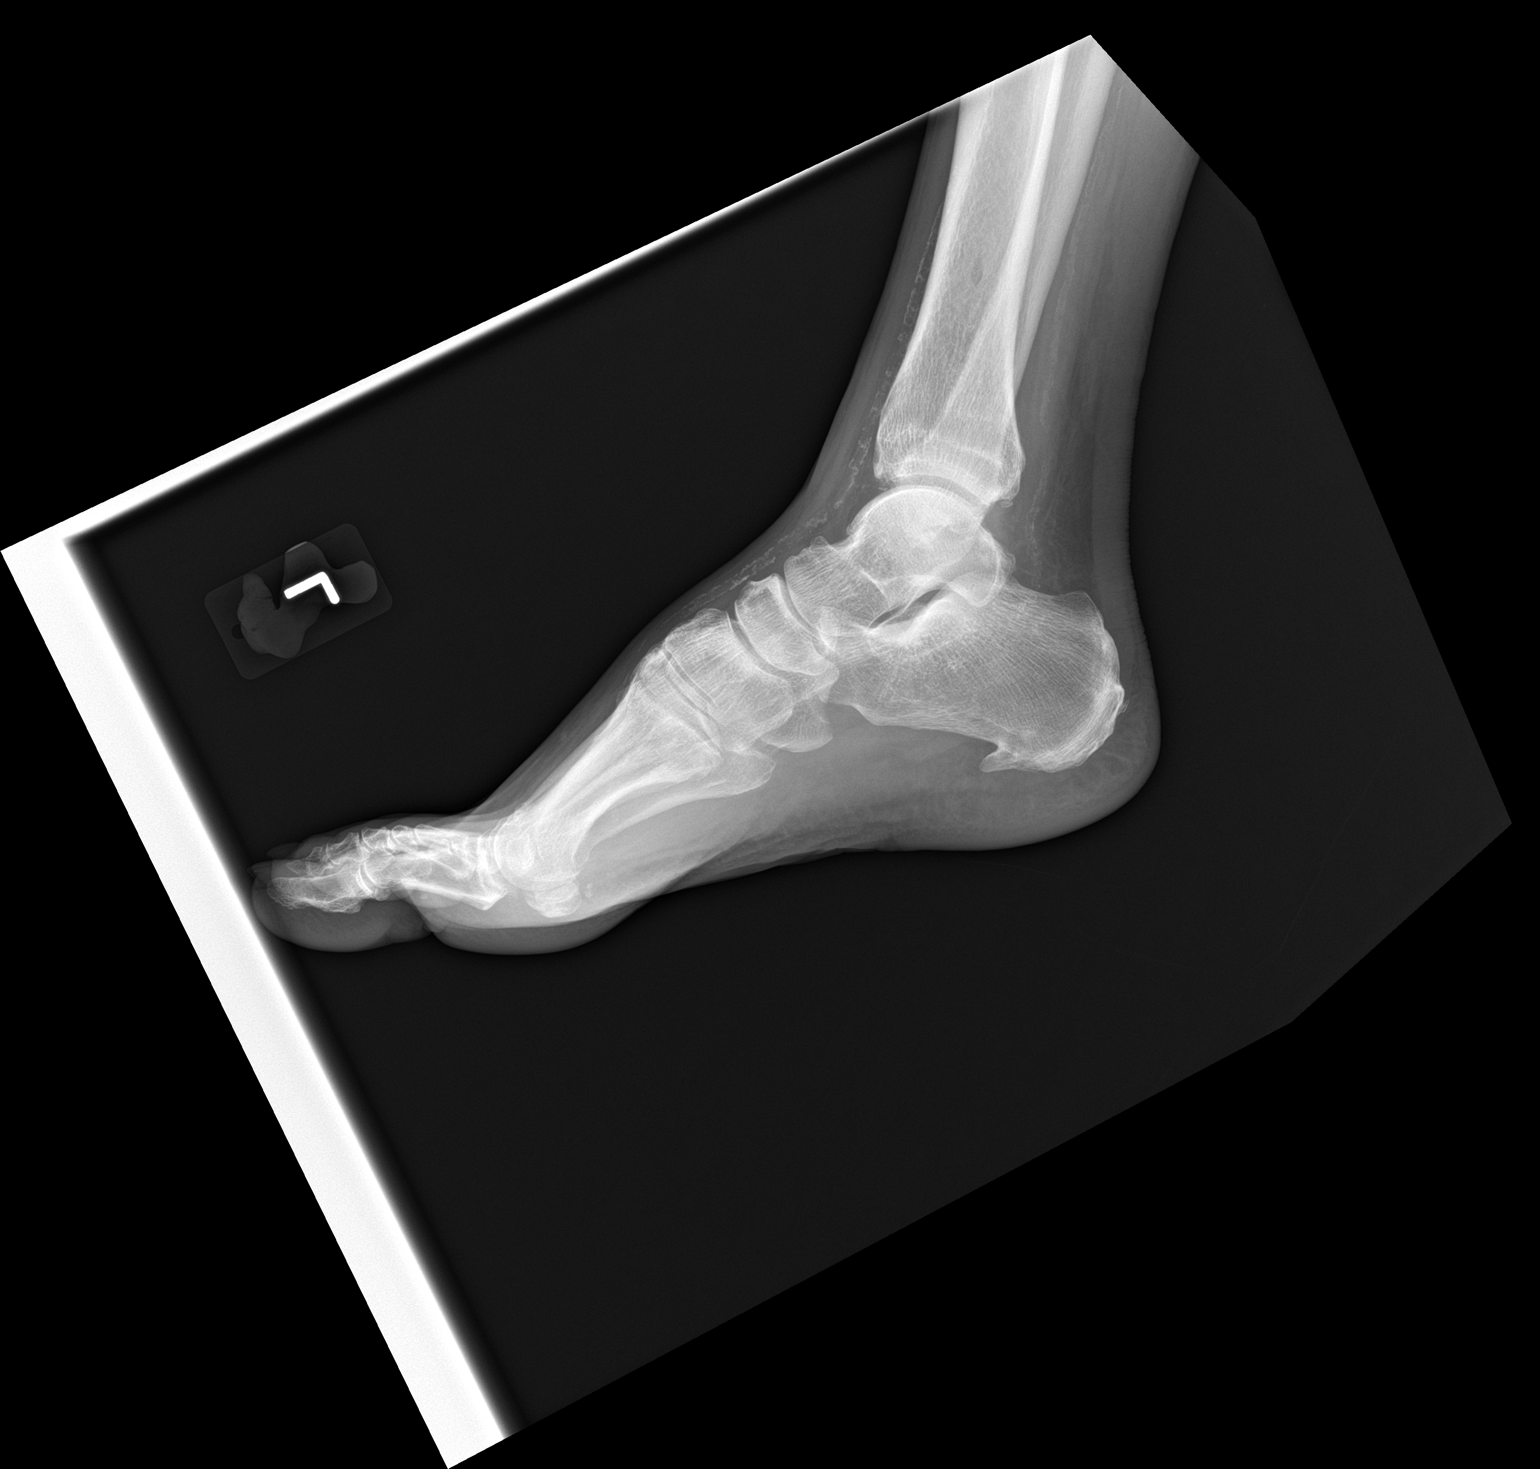

[3 of 3 positions shown; findings below may reference images not displayed]

FINDINGS: Hallux valgus deformity. First MTP joint degenerative changes.
Vascular calcifications. Midfoot degenerative changes. Plantar
calcaneal spur. No evidence for acute fracture or dislocation.
IMPRESSION: Plantar calcaneal spurring.

First MTP joint and midfoot degenerative changes.

Hallux valgus deformity.

Vascular calcifications.

## 2018-02-18 ENCOUNTER — Other Ambulatory Visit: Payer: Self-pay

## 2018-02-18 DIAGNOSIS — F1010 Alcohol abuse, uncomplicated: Secondary | ICD-10-CM | POA: Diagnosis present

## 2018-02-18 DIAGNOSIS — Y92230 Patient room in hospital as the place of occurrence of the external cause: Secondary | ICD-10-CM | POA: Diagnosis not present

## 2018-02-18 DIAGNOSIS — E876 Hypokalemia: Secondary | ICD-10-CM | POA: Diagnosis not present

## 2018-02-18 DIAGNOSIS — J440 Chronic obstructive pulmonary disease with acute lower respiratory infection: Secondary | ICD-10-CM | POA: Diagnosis not present

## 2018-02-18 DIAGNOSIS — W19XXXA Unspecified fall, initial encounter: Secondary | ICD-10-CM | POA: Diagnosis not present

## 2018-02-18 DIAGNOSIS — Z8249 Family history of ischemic heart disease and other diseases of the circulatory system: Secondary | ICD-10-CM

## 2018-02-18 DIAGNOSIS — F17210 Nicotine dependence, cigarettes, uncomplicated: Secondary | ICD-10-CM | POA: Diagnosis present

## 2018-02-18 DIAGNOSIS — E039 Hypothyroidism, unspecified: Secondary | ICD-10-CM | POA: Diagnosis present

## 2018-02-18 DIAGNOSIS — E222 Syndrome of inappropriate secretion of antidiuretic hormone: Secondary | ICD-10-CM | POA: Diagnosis present

## 2018-02-18 DIAGNOSIS — T451X5S Adverse effect of antineoplastic and immunosuppressive drugs, sequela: Secondary | ICD-10-CM

## 2018-02-18 DIAGNOSIS — E43 Unspecified severe protein-calorie malnutrition: Secondary | ICD-10-CM | POA: Diagnosis present

## 2018-02-18 DIAGNOSIS — Z7951 Long term (current) use of inhaled steroids: Secondary | ICD-10-CM

## 2018-02-18 DIAGNOSIS — E861 Hypovolemia: Secondary | ICD-10-CM | POA: Diagnosis present

## 2018-02-18 DIAGNOSIS — I129 Hypertensive chronic kidney disease with stage 1 through stage 4 chronic kidney disease, or unspecified chronic kidney disease: Secondary | ICD-10-CM | POA: Diagnosis present

## 2018-02-18 DIAGNOSIS — C300 Malignant neoplasm of nasal cavity: Secondary | ICD-10-CM | POA: Diagnosis present

## 2018-02-18 DIAGNOSIS — H9109 Ototoxic hearing loss, unspecified ear: Secondary | ICD-10-CM | POA: Diagnosis present

## 2018-02-18 DIAGNOSIS — Z8589 Personal history of malignant neoplasm of other organs and systems: Secondary | ICD-10-CM

## 2018-02-18 DIAGNOSIS — R627 Adult failure to thrive: Secondary | ICD-10-CM | POA: Diagnosis present

## 2018-02-18 DIAGNOSIS — F319 Bipolar disorder, unspecified: Secondary | ICD-10-CM | POA: Diagnosis present

## 2018-02-18 DIAGNOSIS — J9601 Acute respiratory failure with hypoxia: Secondary | ICD-10-CM | POA: Diagnosis not present

## 2018-02-18 DIAGNOSIS — Z681 Body mass index (BMI) 19 or less, adult: Secondary | ICD-10-CM

## 2018-02-18 DIAGNOSIS — I480 Paroxysmal atrial fibrillation: Secondary | ICD-10-CM | POA: Diagnosis not present

## 2018-02-18 DIAGNOSIS — E860 Dehydration: Secondary | ICD-10-CM | POA: Diagnosis present

## 2018-02-18 DIAGNOSIS — N401 Enlarged prostate with lower urinary tract symptoms: Secondary | ICD-10-CM | POA: Diagnosis present

## 2018-02-18 DIAGNOSIS — N179 Acute kidney failure, unspecified: Secondary | ICD-10-CM | POA: Diagnosis present

## 2018-02-18 DIAGNOSIS — C770 Secondary and unspecified malignant neoplasm of lymph nodes of head, face and neck: Secondary | ICD-10-CM | POA: Diagnosis not present

## 2018-02-18 DIAGNOSIS — Z923 Personal history of irradiation: Secondary | ICD-10-CM

## 2018-02-18 DIAGNOSIS — J690 Pneumonitis due to inhalation of food and vomit: Secondary | ICD-10-CM | POA: Diagnosis not present

## 2018-02-18 DIAGNOSIS — Z79899 Other long term (current) drug therapy: Secondary | ICD-10-CM

## 2018-02-18 DIAGNOSIS — Z515 Encounter for palliative care: Secondary | ICD-10-CM | POA: Diagnosis not present

## 2018-02-18 DIAGNOSIS — Z8701 Personal history of pneumonia (recurrent): Secondary | ICD-10-CM

## 2018-02-18 DIAGNOSIS — N183 Chronic kidney disease, stage 3 (moderate): Secondary | ICD-10-CM | POA: Diagnosis present

## 2018-02-18 DIAGNOSIS — Z85810 Personal history of malignant neoplasm of tongue: Secondary | ICD-10-CM

## 2018-02-18 DIAGNOSIS — E871 Hypo-osmolality and hyponatremia: Secondary | ICD-10-CM | POA: Diagnosis not present

## 2018-02-18 DIAGNOSIS — D649 Anemia, unspecified: Secondary | ICD-10-CM | POA: Diagnosis present

## 2018-02-18 DIAGNOSIS — R64 Cachexia: Secondary | ICD-10-CM | POA: Diagnosis present

## 2018-02-18 DIAGNOSIS — G40909 Epilepsy, unspecified, not intractable, without status epilepticus: Secondary | ICD-10-CM | POA: Diagnosis present

## 2018-02-18 DIAGNOSIS — Z79890 Hormone replacement therapy (postmenopausal): Secondary | ICD-10-CM

## 2018-02-18 DIAGNOSIS — Z9221 Personal history of antineoplastic chemotherapy: Secondary | ICD-10-CM

## 2018-02-18 DIAGNOSIS — R1312 Dysphagia, oropharyngeal phase: Secondary | ICD-10-CM | POA: Diagnosis present

## 2018-02-18 DIAGNOSIS — Z85828 Personal history of other malignant neoplasm of skin: Secondary | ICD-10-CM

## 2018-02-18 DIAGNOSIS — R338 Other retention of urine: Secondary | ICD-10-CM | POA: Diagnosis not present

## 2018-02-18 LAB — BASIC METABOLIC PANEL
Anion gap: 12 (ref 5–15)
BUN: 33 mg/dL — ABNORMAL HIGH (ref 8–23)
CALCIUM: 9.1 mg/dL (ref 8.9–10.3)
CHLORIDE: 88 mmol/L — AB (ref 98–111)
CO2: 24 mmol/L (ref 22–32)
CREATININE: 1.69 mg/dL — AB (ref 0.61–1.24)
GFR, EST AFRICAN AMERICAN: 47 mL/min — AB (ref >60)
GFR, EST NON AFRICAN AMERICAN: 40 mL/min — AB (ref >60)
Glucose, Bld: 240 mg/dL — ABNORMAL HIGH (ref 70–99)
Potassium: 3.7 mmol/L (ref 3.5–5.1)
SODIUM: 124 mmol/L — AB (ref 135–145)

## 2018-02-18 LAB — CBC
HCT: 37.2 % — ABNORMAL LOW (ref 40.0–52.0)
Hemoglobin: 12.6 g/dL — ABNORMAL LOW (ref 13.0–18.0)
MCH: 28.9 pg (ref 26.0–34.0)
MCHC: 33.8 g/dL (ref 32.0–36.0)
MCV: 85.4 fL (ref 80.0–100.0)
PLATELETS: 365 10*3/uL (ref 150–440)
RBC: 4.35 MIL/uL — AB (ref 4.40–5.90)
RDW: 14.2 % (ref 11.5–14.5)
WBC: 7.7 10*3/uL (ref 3.8–10.6)

## 2018-02-18 NOTE — ED Triage Notes (Signed)
Pt arrives to ED via POV from home with c/o neck pain x1 yr. Pt has a knot on the side of his neck that his daughter states is r/t "cancer" or a "tumor". Daughter also reports pt has a h/x of ETOH abuse. Pt reports nausea, but denies vomiting or diarrhea.

## 2018-02-19 ENCOUNTER — Encounter: Payer: Self-pay | Admitting: Radiology

## 2018-02-19 ENCOUNTER — Emergency Department: Payer: Medicare Other

## 2018-02-19 ENCOUNTER — Inpatient Hospital Stay
Admission: EM | Admit: 2018-02-19 | Discharge: 2018-03-08 | DRG: 823 | Disposition: A | Discharge status: Skilled Nursing Facility | Payer: Medicare Other | Attending: Internal Medicine | Admitting: Internal Medicine

## 2018-02-19 ENCOUNTER — Other Ambulatory Visit: Payer: Self-pay

## 2018-02-19 ENCOUNTER — Observation Stay: Payer: Medicare Other

## 2018-02-19 DIAGNOSIS — R630 Anorexia: Secondary | ICD-10-CM | POA: Diagnosis not present

## 2018-02-19 DIAGNOSIS — C801 Malignant (primary) neoplasm, unspecified: Secondary | ICD-10-CM | POA: Diagnosis not present

## 2018-02-19 DIAGNOSIS — R634 Abnormal weight loss: Secondary | ICD-10-CM

## 2018-02-19 DIAGNOSIS — E871 Hypo-osmolality and hyponatremia: Secondary | ICD-10-CM

## 2018-02-19 DIAGNOSIS — Z7189 Other specified counseling: Secondary | ICD-10-CM

## 2018-02-19 DIAGNOSIS — Z85810 Personal history of malignant neoplasm of tongue: Secondary | ICD-10-CM

## 2018-02-19 DIAGNOSIS — R221 Localized swelling, mass and lump, neck: Secondary | ICD-10-CM

## 2018-02-19 DIAGNOSIS — E43 Unspecified severe protein-calorie malnutrition: Secondary | ICD-10-CM

## 2018-02-19 DIAGNOSIS — Z515 Encounter for palliative care: Secondary | ICD-10-CM | POA: Diagnosis not present

## 2018-02-19 DIAGNOSIS — C760 Malignant neoplasm of head, face and neck: Secondary | ICD-10-CM

## 2018-02-19 DIAGNOSIS — R590 Localized enlarged lymph nodes: Secondary | ICD-10-CM | POA: Diagnosis not present

## 2018-02-19 DIAGNOSIS — T17908A Unspecified foreign body in respiratory tract, part unspecified causing other injury, initial encounter: Secondary | ICD-10-CM

## 2018-02-19 DIAGNOSIS — R0600 Dyspnea, unspecified: Secondary | ICD-10-CM

## 2018-02-19 DIAGNOSIS — W19XXXA Unspecified fall, initial encounter: Secondary | ICD-10-CM

## 2018-02-19 DIAGNOSIS — R1310 Dysphagia, unspecified: Secondary | ICD-10-CM

## 2018-02-19 DIAGNOSIS — N189 Chronic kidney disease, unspecified: Secondary | ICD-10-CM

## 2018-02-19 DIAGNOSIS — R509 Fever, unspecified: Secondary | ICD-10-CM

## 2018-02-19 LAB — CBC
HEMATOCRIT: 33 % — AB (ref 40.0–52.0)
Hemoglobin: 11.5 g/dL — ABNORMAL LOW (ref 13.0–18.0)
MCH: 29.6 pg (ref 26.0–34.0)
MCHC: 34.9 g/dL (ref 32.0–36.0)
MCV: 84.8 fL (ref 80.0–100.0)
Platelets: 297 10*3/uL (ref 150–440)
RBC: 3.9 MIL/uL — ABNORMAL LOW (ref 4.40–5.90)
RDW: 14.4 % (ref 11.5–14.5)
WBC: 7.1 10*3/uL (ref 3.8–10.6)

## 2018-02-19 LAB — BASIC METABOLIC PANEL
ANION GAP: 6 (ref 5–15)
BUN: 23 mg/dL (ref 8–23)
CALCIUM: 8.2 mg/dL — AB (ref 8.9–10.3)
CHLORIDE: 94 mmol/L — AB (ref 98–111)
CO2: 26 mmol/L (ref 22–32)
CREATININE: 1.23 mg/dL (ref 0.61–1.24)
GFR calc non Af Amer: 59 mL/min — ABNORMAL LOW (ref >60)
Glucose, Bld: 100 mg/dL — ABNORMAL HIGH (ref 70–99)
Potassium: 3.6 mmol/L (ref 3.5–5.1)
Sodium: 126 mmol/L — ABNORMAL LOW (ref 135–145)

## 2018-02-19 LAB — CREATININE, SERUM
Creatinine, Ser: 1.2 mg/dL (ref 0.61–1.24)
GFR calc Af Amer: >60 mL/min (ref >60)

## 2018-02-19 LAB — STREP PNEUMONIAE URINARY ANTIGEN: Strep Pneumo Urinary Antigen: NEGATIVE

## 2018-02-19 LAB — TSH: TSH: 17.528 u[IU]/mL — ABNORMAL HIGH (ref 0.350–4.500)

## 2018-02-19 LAB — PREALBUMIN: Prealbumin: <5 mg/dL — ABNORMAL LOW (ref 18–38)

## 2018-02-19 MED ORDER — ONDANSETRON HCL 4 MG/2ML IJ SOLN
4.0000 mg | Freq: Four times a day (QID) | INTRAMUSCULAR | Status: DC | PRN
  Administered 2018-02-24 – 2018-03-06 (×5): 4 mg via INTRAVENOUS
  Filled 2018-02-19 (×5): qty 2

## 2018-02-19 MED ORDER — MORPHINE SULFATE (PF) 2 MG/ML IV SOLN
2.0000 mg | INTRAVENOUS | Status: DC | PRN
  Administered 2018-02-19 – 2018-02-24 (×21): 2 mg via INTRAVENOUS
  Filled 2018-02-19 (×21): qty 1

## 2018-02-19 MED ORDER — NICOTINE 14 MG/24HR TD PT24
14.0000 mg | MEDICATED_PATCH | Freq: Every day | TRANSDERMAL | Status: DC
  Administered 2018-02-19 – 2018-03-08 (×18): 14 mg via TRANSDERMAL
  Filled 2018-02-19 (×19): qty 1

## 2018-02-19 MED ORDER — POLYETHYLENE GLYCOL 3350 17 G PO PACK: 17.0000 g | PACK | Freq: Every day | ORAL | Status: DC | PRN

## 2018-02-19 MED ORDER — IOHEXOL 300 MG/ML  SOLN
60.0000 mL | Freq: Once | INTRAMUSCULAR | Status: AC | PRN
  Administered 2018-02-19: 60 mL via INTRAVENOUS

## 2018-02-19 MED ORDER — SODIUM CHLORIDE 0.9% FLUSH: 3.0000 mL | INTRAVENOUS | Status: DC | PRN

## 2018-02-19 MED ORDER — ACETAMINOPHEN 650 MG RE SUPP
650.0000 mg | Freq: Four times a day (QID) | RECTAL | Status: DC | PRN
Start: 2018-02-19 — End: 2018-03-08
  Administered 2018-02-25: 04:00:00 650 mg via RECTAL
  Filled 2018-02-19 (×2): qty 1

## 2018-02-19 MED ORDER — IPRATROPIUM-ALBUTEROL 0.5-2.5 (3) MG/3ML IN SOLN
3.0000 mL | Freq: Four times a day (QID) | RESPIRATORY_TRACT | Status: DC
  Administered 2018-02-19 (×2): 3 mL via RESPIRATORY_TRACT
  Filled 2018-02-19 (×2): qty 3

## 2018-02-19 MED ORDER — LORAZEPAM 1 MG PO TABS: 1.0000 mg | ORAL_TABLET | Freq: Four times a day (QID) | ORAL | Status: AC | PRN

## 2018-02-19 MED ORDER — FOLIC ACID 1 MG PO TABS
1.0000 mg | ORAL_TABLET | Freq: Every day | ORAL | Status: DC
Start: 2018-02-19 — End: 2018-03-08
  Administered 2018-02-19 – 2018-03-08 (×13): 1 mg via ORAL
  Filled 2018-02-19 (×14): qty 1

## 2018-02-19 MED ORDER — ADULT MULTIVITAMIN W/MINERALS CH
1.0000 | ORAL_TABLET | Freq: Every day | ORAL | Status: DC
  Administered 2018-02-19 – 2018-03-08 (×13): 1 via ORAL
  Filled 2018-02-19 (×14): qty 1

## 2018-02-19 MED ORDER — MOMETASONE FURO-FORMOTEROL FUM 100-5 MCG/ACT IN AERO
2.0000 | INHALATION_SPRAY | Freq: Two times a day (BID) | RESPIRATORY_TRACT | Status: DC
  Administered 2018-02-19 – 2018-03-08 (×35): 2 via RESPIRATORY_TRACT
  Filled 2018-02-19 (×4): qty 8.8

## 2018-02-19 MED ORDER — SODIUM CHLORIDE 1 G PO TABS
2.0000 g | ORAL_TABLET | Freq: Three times a day (TID) | ORAL | Status: DC
  Administered 2018-02-19 – 2018-02-21 (×7): 2 g via ORAL
  Filled 2018-02-19 (×10): qty 2

## 2018-02-19 MED ORDER — ONDANSETRON HCL 4 MG PO TABS: 4.0000 mg | ORAL_TABLET | Freq: Four times a day (QID) | ORAL | Status: DC | PRN

## 2018-02-19 MED ORDER — DULOXETINE HCL 30 MG PO CPEP
30.0000 mg | ORAL_CAPSULE | Freq: Every day | ORAL | Status: DC
  Administered 2018-02-19 – 2018-02-22 (×4): 30 mg via ORAL
  Filled 2018-02-19 (×5): qty 1

## 2018-02-19 MED ORDER — FLUTICASONE PROPIONATE 50 MCG/ACT NA SUSP
1.0000 | Freq: Every day | NASAL | Status: DC
  Administered 2018-02-19 – 2018-03-08 (×17): 1 via NASAL
  Filled 2018-02-19 (×2): qty 16

## 2018-02-19 MED ORDER — LORAZEPAM 2 MG/ML IJ SOLN: 1.0000 mg | Freq: Four times a day (QID) | INTRAMUSCULAR | Status: AC | PRN

## 2018-02-19 MED ORDER — SODIUM CHLORIDE 0.9 % IV SOLN
250.0000 mL | INTRAVENOUS | Status: DC | PRN
  Administered 2018-02-19: 14:00:00 250 mL via INTRAVENOUS

## 2018-02-19 MED ORDER — ACETAMINOPHEN 325 MG PO TABS: 650.0000 mg | ORAL_TABLET | Freq: Four times a day (QID) | ORAL | Status: DC | PRN

## 2018-02-19 MED ORDER — CLONAZEPAM 1 MG PO TABS
1.0000 mg | ORAL_TABLET | Freq: Three times a day (TID) | ORAL | Status: DC | PRN
  Administered 2018-02-20 – 2018-03-01 (×2): 1 mg via ORAL
  Filled 2018-02-19 (×2): qty 2
  Filled 2018-02-19: qty 1

## 2018-02-19 MED ORDER — HYDROCODONE-ACETAMINOPHEN 5-325 MG PO TABS
2.0000 | ORAL_TABLET | Freq: Four times a day (QID) | ORAL | Status: DC | PRN
  Administered 2018-02-19 – 2018-02-22 (×4): 2 via ORAL
  Filled 2018-02-19 (×4): qty 2

## 2018-02-19 MED ORDER — SODIUM CHLORIDE 0.9 % IV BOLUS
1000.0000 mL | Freq: Once | INTRAVENOUS | Status: AC
  Administered 2018-02-19: 1000 mL via INTRAVENOUS

## 2018-02-19 MED ORDER — METRONIDAZOLE 500 MG PO TABS
500.0000 mg | ORAL_TABLET | Freq: Three times a day (TID) | ORAL | Status: DC
  Administered 2018-02-19 – 2018-02-20 (×3): 500 mg via ORAL
  Filled 2018-02-19 (×3): qty 1

## 2018-02-19 MED ORDER — VITAMIN B-1 100 MG PO TABS
100.0000 mg | ORAL_TABLET | Freq: Every day | ORAL | Status: DC
  Administered 2018-02-19 – 2018-03-08 (×13): 100 mg via ORAL
  Filled 2018-02-19 (×15): qty 1

## 2018-02-19 MED ORDER — HEPARIN SODIUM (PORCINE) 5000 UNIT/ML IJ SOLN
5000.0000 [IU] | Freq: Three times a day (TID) | INTRAMUSCULAR | Status: AC
  Administered 2018-02-19 – 2018-02-26 (×18): 5000 [IU] via SUBCUTANEOUS
  Filled 2018-02-19 (×20): qty 1

## 2018-02-19 MED ORDER — SODIUM CHLORIDE 0.9% FLUSH
3.0000 mL | Freq: Two times a day (BID) | INTRAVENOUS | Status: DC
  Administered 2018-02-19 – 2018-02-20 (×4): 3 mL via INTRAVENOUS

## 2018-02-19 MED ORDER — LEVOTHYROXINE SODIUM 50 MCG PO TABS
50.0000 ug | ORAL_TABLET | Freq: Every day | ORAL | Status: DC
  Administered 2018-02-20 – 2018-02-22 (×3): 50 ug via ORAL
  Filled 2018-02-19 (×4): qty 1

## 2018-02-19 MED ORDER — THIAMINE HCL 100 MG/ML IJ SOLN
100.0000 mg | Freq: Every day | INTRAMUSCULAR | Status: DC
  Administered 2018-02-23 – 2018-02-27 (×4): 100 mg via INTRAVENOUS
  Filled 2018-02-19 (×11): qty 1

## 2018-02-19 MED ORDER — AMOXICILLIN-POT CLAVULANATE 875-125 MG PO TABS
1.0000 | ORAL_TABLET | Freq: Two times a day (BID) | ORAL | Status: DC
  Administered 2018-02-19 (×2): 1 via ORAL
  Filled 2018-02-19 (×3): qty 1

## 2018-02-19 NOTE — Consult Note (Signed)
Hematology/Oncology Consult note [facility] [phone] [phone]  Patient Care Team: [facility] as PCP - General   Name of the patient: Thomas [name]  [id number]  19-Oct-1950   Date of visit: 02/19/18 REASON FOR COSULTATION:  Recurrent neck cancer. History of presenting illness-  67 y.o. male with PMH listed at below who presents to ER complaining about left neck swelling, profound weakness, weight loss, poor p.o. Intake ER work-up.  Showed sodium of 124, creatinine 1.6.  CT neck noted for necrotic lymphadenopathy, left level 2/3 station, measuring 2.6 x 2.5 x 3.8 cm.  Likely metastatic.  No additional lymphadenopathy identified.  Posttreatment changes of oral and hypopharyngeal mucosa and postsurgical changes related to right cervical node dissection. Patient is very poor historian.  Daughter is at bedside who provides some history Extensive medical chart review was performed.  Patient's previous oncology care was at [facility].  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 He underwent right side neck dissection on 05/13/2014. Pathology showed subcentimeter SCC level II region which clear margins,  He was last seen by ENT surgeon Dr.Zanation Adam on 06/26/2014, supposed to follow-up in 3 months patient lost follow-up.  Per daughter, for the past couple of months, patient has been progressively getting worse.  He lies in his bed all day long, only gets up when he needs to go to bathroom.  Very poor appetite and p.o. intake.  Weight loss.  Also has noticed left neck swelling for couple of months.  patient denies  any swallowing difficulties with solid food.  Patient lives at home with daughter. Patient has chronic hearing impairment due to previous chemotherapy.  Review of Systems  Unable to perform ROS: Medical condition  Constitutional: Positive for malaise/fatigue and weight loss.  HENT: Positive for hearing loss.   Respiratory: Negative for shortness of breath.   Cardiovascular: Negative for chest pain.  Neurological: Positive for weakness.    No Known Allergies  Patient Active Problem List   Diagnosis Date Noted  . Cancer ([place]) 02/19/2018  . Protein-calorie malnutrition, severe 01/22/2016  . Alcohol use disorder, severe, dependence ([place]) 12/12/2014  . Hyponatremia 12/12/2014  . Essential hypertension 12/12/2014  . COPD (chronic obstructive pulmonary disease) ([place]) 12/12/2014     Past Medical History:  Diagnosis Date  . Cancer of nasal cavities ([place])   . Enlarged prostate   . Hypertension   . Lung infection    no lung cancer  . Radiation    to neck  . Renal disorder   . Skin cancer   . Status post chemotherapy   . Tongue cancer [facility])      Past Surgical History:  Procedure Laterality Date  . cancerous lymph nodes removed from neck    . KNEE SURGERY Right   . SKIN BIOPSY      Social History   Socioeconomic History  . Marital status: Divorced    Spouse name: Not on file  . Number of children: Not on file  . Years of education: Not on file  . Highest education level: Not on file  Occupational History  . Not on file  Social Needs  .  Financial resource strain: Not on file  . Food insecurity:    Worry: Not on file    Inability: Not on file  . Transportation needs:    Medical: Not on file    Non-medical: Not on file  Tobacco Use  . Smoking status: Heavy Tobacco Smoker    Types: Cigarettes  . Smokeless tobacco: Never Used  Substance and Sexual Activity  . Alcohol use: Yes    Comment: 40oz x4 daily  . Drug use: No  . Sexual activity: Not on file  Lifestyle    . Physical activity:    Days per week: Not on file    Minutes per session: Not on file  . Stress: Not on file  Relationships  . Social connections:    Talks on phone: Not on file    Gets together: Not on file    Attends religious service: Not on file    Active member of club or organization: Not on file    Attends meetings of clubs or organizations: Not on file    Relationship status: Not on file  . Intimate partner violence:    Fear of current or ex partner: Not on file    Emotionally abused: Not on file    Physically abused: Not on file    Forced sexual activity: Not on file  Other Topics Concern  . Not on file  Social History Narrative  . Not on file     Family History  Problem Relation Age of Onset  . Hypertension Mother      Current Facility-Administered Medications:  .  0.9 %  sodium chloride infusion, 250 mL, Intravenous, PRN, Salary, Montell D, MD, Last Rate: 10 mL/hr at 02/19/18 1350, 250 mL at 02/19/18 1350 .  acetaminophen (TYLENOL) tablet 650 mg, 650 mg, Oral, Q6H PRN **OR** acetaminophen (TYLENOL) suppository 650 mg, 650 mg, Rectal, Q6H PRN, Salary, Montell D, MD .  amoxicillin-clavulanate (AUGMENTIN) 875-125 MG per tablet 1 tablet, 1 tablet, Oral, Q12H, Salary, Montell D, MD, 1 tablet at 02/19/18 1316 .  clonazePAM (KLONOPIN) tablet 1 mg, 1 mg, Oral, TID PRN, Salary, Montell D, MD .  DULoxetine (CYMBALTA) DR capsule 30 mg, 30 mg, Oral, Daily, Salary, Montell D, MD, 30 mg at 02/19/18 1316 .  fluticasone (FLONASE) 50 MCG/ACT nasal spray 1 spray, 1 spray, Each Nare, Daily, Salary, Montell D, MD, 1 spray at 02/19/18 1626 .  folic acid (FOLVITE) tablet 1 mg, 1 mg, Oral, Daily, Salary, Montell D, MD, 1 mg at 02/19/18 1316 .  heparin injection 5,000 Units, 5,000 Units, Subcutaneous, Q8H, Salary, Montell D, MD, 5,000 Units at 02/19/18 1315 .  HYDROcodone-acetaminophen (NORCO/VICODIN) 5-325 MG per tablet 2 tablet, 2 tablet, Oral, Q6H PRN, Salary, Montell D, MD, 2 tablet  at 02/19/18 1625 .  ipratropium-albuterol (DUONEB) 0.5-2.5 (3) MG/3ML nebulizer solution 3 mL, 3 mL, Nebulization, Q6H, Salary, Montell D, MD, 3 mL at 02/19/18 1332 .  [START ON 02/20/2018] levothyroxine (SYNTHROID, LEVOTHROID) tablet 50 mcg, 50 mcg, Oral, QAC breakfast, Salary, Montell D, MD .  LORazepam (ATIVAN) tablet 1 mg, 1 mg, Oral, Q6H PRN **OR** LORazepam (ATIVAN) injection 1 mg, 1 mg, Intravenous, Q6H PRN, Salary, Montell D, MD .  metroNIDAZOLE (FLAGYL) tablet 500 mg, 500 mg, Oral, Q8H, Salary, Montell D, MD, 500 mg at 02/19/18 1316 .  mometasone-formoterol (DULERA) 100-5 MCG/ACT inhaler 2 puff, 2 puff, Inhalation, BID, Salary, Montell D, MD, 2 puff at 02/19/18 1626 .  morphine 2 MG/ML injection 2 mg, 2 mg,  Intravenous, Q2H PRN, Salary, Montell D, MD, 2 mg at 02/19/18 1349 .  multivitamin with minerals tablet 1 tablet, 1 tablet, Oral, Daily, Salary, Montell D, MD, 1 tablet at 02/19/18 1316 .  nicotine (NICODERM CQ - dosed in mg/24 hours) patch 14 mg, 14 mg, Transdermal, Daily, Salary, Montell D, MD, 14 mg at 02/19/18 1315 .  ondansetron (ZOFRAN) tablet 4 mg, 4 mg, Oral, Q6H PRN **OR** ondansetron (ZOFRAN) injection 4 mg, 4 mg, Intravenous, Q6H PRN, Salary, Montell D, MD .  polyethylene glycol (MIRALAX / GLYCOLAX) packet 17 g, 17 g, Oral, Daily PRN, Salary, Montell D, MD .  sodium chloride flush (NS) 0.9 % injection 3 mL, 3 mL, Intravenous, Q12H, Salary, Montell D, MD, 3 mL at 02/19/18 1319 .  sodium chloride flush (NS) 0.9 % injection 3 mL, 3 mL, Intravenous, PRN, Salary, Montell D, MD .  sodium chloride tablet 2 g, 2 g, Oral, TID WC, Salary, Montell D, MD, 2 g at 02/19/18 1315 .  thiamine (VITAMIN B-1) tablet 100 mg, 100 mg, Oral, Daily, 100 mg at 02/19/18 1316 **OR** thiamine (B-1) injection 100 mg, 100 mg, Intravenous, Daily, Salary, Holly Bodily D, MD   Physical exam: ECOG 3 Vitals:   02/19/18 1000 02/19/18 1047 02/19/18 1200 02/19/18 1243  BP: (!) 151/86 (!) 165/100 (!) 148/90 (!) 155/95   Pulse: 87  83 84  Resp:   18 18  Temp:    (!) 97.4 F (36.3 C)  TempSrc:      SpO2: 98%  100%   Weight:    107 lb (48.5 kg)  Height:    5\' 7"  (1.702 m)   Physical Exam  Constitutional:  Frail, chronic ill appearance.  HENT:  Head: Normocephalic.  Status post partial rhinectomy with skin graft.  Eyes: Pupils are equal, round, and reactive to light. EOM are normal.  Neck:  Left neck skin hardening/fixed mass.  Cardiovascular: Normal rate and regular rhythm.  Pulmonary/Chest: Effort normal. No respiratory distress.  Decreased breath sounds bilaterally.  Abdominal: Soft. Bowel sounds are normal. He exhibits no distension.  Musculoskeletal: Normal range of motion.  Neurological: He is alert.  Chronic hearing deficiency, gait not checked  Skin: Skin is warm and dry. He is not diaphoretic. No erythema.        CMP Latest Ref Rng & Units 02/19/2018  Glucose 70 - 99 mg/dL -  BUN 8 - 23 mg/dL -  Creatinine 0.61 - 1.24 mg/dL 1.20  Sodium 135 - 145 mmol/L -  Potassium 3.5 - 5.1 mmol/L -  Chloride 98 - 111 mmol/L -  CO2 22 - 32 mmol/L -  Calcium 8.9 - 10.3 mg/dL -  Total Protein 6.5 - 8.1 g/dL -  Total Bilirubin 0.3 - 1.2 mg/dL -  Alkaline Phos 38 - 126 U/L -  AST 15 - 41 U/L -  ALT 17 - 63 U/L -   CBC Latest Ref Rng & Units 02/19/2018  WBC 3.8 - 10.6 K/uL 7.1  Hemoglobin 13.0 - 18.0 g/dL 11.5(L)  Hematocrit 40.0 - 52.0 % 33.0(L)  Platelets 150 - 440 K/uL 297   RADIOGRAPHIC STUDIES: I have personally reviewed the radiological images as listed and agreed with the findings in the report. Dg Chest 2 View  Result Date: 02/19/2018 CLINICAL DATA:  67 year old male with shortness of breath and COPD. EXAM: CHEST - 2 VIEW COMPARISON:  Chest radiograph dated 02/09/2016 FINDINGS: There is emphysematous changes of the lungs with hyperinflation. Diffuse interstitial coarsening. No focal consolidation, pleural effusion,  or pneumothorax. The cardiac silhouette is within normal limits.  Atherosclerotic calcification of the aortic arch. No acute osseous pathology. Multiple small radiopaque foci over the left chest wall and left upper extremity soft tissues. Clinical correlation is recommended. IMPRESSION: 1. No acute cardiopulmonary process. 2. Emphysema. Electronically Signed   By: Anner Crete M.D.   On: 02/19/2018 06:13   Ct Soft Tissue Neck W Contrast  Result Date: 02/19/2018 CLINICAL DATA:  67 y/o M; history of tongue cancer post radiation. KNot on the left-sided neck. Squamous cell carcinoma of the nose. EXAM: CT NECK WITH CONTRAST TECHNIQUE: Multidetector CT imaging of the neck was performed using the standard protocol following the bolus administration of intravenous contrast. CONTRAST:  36mL OMNIPAQUE IOHEXOL 300 MG/ML  SOLN COMPARISON:  05/21/2013 PET-CT. FINDINGS: Pharynx and larynx: Diffuse smooth mucosal thickening of the oral and hypopharynx compatible with posttreatment changes. No exophytic enhancing nodular component. Salivary glands: Absent right submandibular gland. Additional salivary glands are unremarkable. Thyroid: Normal. Lymph nodes: Status post right radical neck dissection. No nodular enhancing disease within the right cervical chain to suggest recurrence. Necrotic lymphadenopathy in the left level 2/3 station measuring 2.6 x 2.5 x 3.8 cm (AP x ML x CC series 2, image 51 and series 7, image 60). No additional cervical lymphadenopathy identified. Vascular: Dense calcified plaque of the carotid bifurcations bilaterally with mild to moderate 50% left proximal ICA stenosis. Mild less than 50% proximal right ICA stenosis. The right vertebral artery neck is diffusely small in caliber with thickened wall and multiple areas of stenosis likely representing radiation changes. Limited intracranial: Negative. Visualized orbits: Negative. Mastoids and visualized paranasal sinuses: Moderate mucosal thickening of the maxillary sinuses bilaterally with chronic inflammatory changes  of the sinus walls. Left mastoid tip effusion. Normal aeration of the right mastoid air cells. Skeleton: Moderate cervical spondylosis. No acute osseous abnormality identified. No high-grade bony canal stenosis. Upper chest: Mild centrilobular emphysema of the upper lobes. Clustered ground-glass opacities and mucous plugging in the left upper lobe, likely bronchopneumonia. Other: None. IMPRESSION: 1. Necrotic lymphadenopathy measuring up to 3.8 cm at the left level 2/3 cervical node station, likely metastatic. No additional lymphadenopathy identified. 2. Posttreatment changes of the oral and hypopharyngeal mucosa and postsurgical changes related to right cervical node dissection. 3. Moderate mucosal thickening of the maxillary sinuses and left mastoid tip effusion. 4. Calcific atherosclerosis of carotid bifurcations with mild to moderate 50% left proximal ICA stenosis. 5. Left upper lobe ground-glass opacities in bronchovascular distribution, likely bronchopneumonia. Electronically Signed   By: Kristine Garbe M.D.   On: 02/19/2018 04:36   US Renal  Result Date: 02/19/2018 CLINICAL DATA:  Chronic renal disease EXAM: RENAL / URINARY TRACT ULTRASOUND COMPLETE COMPARISON:  None. FINDINGS: Right Kidney: Length: 10.2 cm. Echogenicity and renal cortical thickness are within normal limits. No mass, perinephric fluid, or hydronephrosis visualized. No sonographically demonstrable calculus or ureterectasis. Left Kidney: Length: 10.0 cm. Echogenicity and renal cortical thickness are within normal limits. No mass, perinephric fluid, or hydronephrosis visualized. There are nonobstructing calculi in the left kidney, largest measuring 9 mm. No ureterectasis. Bladder: Appears normal for degree of bladder distention. IMPRESSION: Nonobstructing calculi in left kidney. Study otherwise unremarkable. Electronically Signed   By: Lowella Grip III M.D.   On: 02/19/2018 12:00    Assessment and plan- Patient is a 67 y.o.  male with past medical history of stage IV squamous cell carcinoma of tongue base status post concurrent chemoradiation and neck dissection, stage II squamous cell carcinoma  of nose status post partial rhinectomy and skin graft present for evaluation of progressive fatigue, weight loss, poor appetite, and left neck swelling.  # cervical adenopathy, likely metastasis.  Agree with ENT consultation for biopsy to confirm recurrence.  Outpatient PET scan. Image results were independently reviewed by me and discussed with patient and his daughter.  Discussed about the plan.  #Hyponatremia,  patient has chronic hyponatremia since 2012.  Likely due to chronic alcoholism, combination with poor p.o. Intake/dehydration.  Continue IV fluid hydration.  Started on salt tablets 2 g 3 times a day continue monitor daily sodium level.  #AKI on CKD continue IV fluid. #Weight loss/poor p.o. Intake, discussed with palliative care.  Will start patient on appetite stimulants # Hypothyroidism: On Synthroid 50 MCG daily.  Not sure if he is taking.  Restarted inpatient. #Questionable aspiration, recommend speech pathology evaluation.   Thank you for allowing me to participate in the care of this patient.  Total face to face encounter time for this patient visit was 70 min. >50% of the time was  spent in counseling and coordination of care.    Earlie Server, MD, PhD Hematology Oncology [facility] at [facility] Pager- [phone] 02/19/2018

## 2018-02-19 NOTE — ED Notes (Signed)
Patient transported to CT 

## 2018-02-19 NOTE — Progress Notes (Signed)
CH responded to an OR to have an (AD) completed. The patient's daughter whom is in the room, will be the assigned healthcare proxy, she has read over the document, understands and both Thomas [name] and her agreement. Attempted to locate notary, possibly a notary with security will be available after 4 pm, CH updated the patient and daughter, with plans to check back after 4 pm to see if a notary can be located.

## 2018-02-19 NOTE — Progress Notes (Signed)
[facility]  ROUNDING NOTE   Subjective:   Thomas [name] admitted to [facility] on 02/19/2018 for Hyponatremia [E87.1] Cervical lymphadenopathy [R59.0] CKD (chronic kidney disease) [N18.9]  Started on fluid restriction.   Daughter at bedside. States patient has been laying in bed and not eating or drinking for several days to weeks. CT with new metastasis.   Objective:  Vital signs in last 24 hours:  Temp:  [97.4 F (36.3 C)-98.5 F (36.9 C)] 97.4 F (36.3 C) (07/16 1243) Pulse Rate:  [82-104] 84 (07/16 1243) Resp:  [17-21] 18 (07/16 1243) BP: (114-165)/(68-100) 155/95 (07/16 1243) SpO2:  [93 %-100 %] 100 % (07/16 1200) Weight:  [48.5 kg (107 lb)-61.2 kg (135 lb)] 48.5 kg (107 lb) (07/16 1243)  Weight change:  Filed Weights   02/18/18 2322 02/19/18 1243  Weight: 61.2 kg (135 lb) 48.5 kg (107 lb)    Intake/Output: No intake/output data recorded.   Intake/Output this shift:  No intake/output data recorded.  Physical Exam: General: NAD, laying in bed, cachectic  Head: Normocephalic, atraumatic. Moist oral mucosal membranes  Eyes: Anicteric, PERRL  Neck: Supple, trachea midline  Lungs:  Clear to auscultation  Heart: Regular rate and rhythm  Abdomen:  Soft, nontender,   Extremities:  no peripheral edema.  Neurologic: Nonfocal, moving all four extremities  Skin: No lesions        Basic Metabolic Panel: Recent Labs  Lab 02/18/18 2327 02/19/18 1308  NA 124*  --   K 3.7  --   CL 88*  --   CO2 24  --   GLUCOSE 240*  --   BUN 33*  --   CREATININE 1.69* 1.20  CALCIUM 9.1  --     Liver Function Tests: No results for input(s): AST, ALT, ALKPHOS, BILITOT, PROT, ALBUMIN in the last 168 hours. No results for input(s): LIPASE, AMYLASE in the last 168 hours. No results for input(s): AMMONIA in the last 168 hours.  CBC: Recent Labs  Lab 02/18/18 2327 02/19/18 1308  WBC 7.7 7.1  HGB 12.6* 11.5*  HCT 37.2* 33.0*  MCV 85.4 84.8  PLT 365 297     Cardiac Enzymes: No results for input(s): CKTOTAL, CKMB, CKMBINDEX, TROPONINI in the last 168 hours.  BNP: Invalid input(s): POCBNP  CBG: No results for input(s): GLUCAP in the last 168 hours.  Microbiology: Results for orders placed or performed during the hospital encounter of 01/22/16  CULTURE, BLOOD (ROUTINE X 2) w Reflex to ID Panel     Status: None   Collection Time: 01/24/16  3:15 PM  Result Value Ref Range Status   Specimen Description BLOOD RIGHT A  Final   Special Requests   Final    BOTTLES DRAWN AEROBIC AND ANAEROBIC AER 10ML ANA 5ML   Culture NO GROWTH 5 DAYS  Final   Report Status 01/29/2016 FINAL  Final  CULTURE, BLOOD (ROUTINE X 2) w Reflex to ID Panel     Status: None   Collection Time: 01/24/16  3:15 PM  Result Value Ref Range Status   Specimen Description BLOOD LEFT A  Final   Special Requests   Final    BOTTLES DRAWN AEROBIC AND ANAEROBIC AER 10ML ANA 9ML   Culture NO GROWTH 5 DAYS  Final   Report Status 01/29/2016 FINAL  Final    Coagulation Studies: No results for input(s): LABPROT, INR in the last 72 hours.  Urinalysis: No results for input(s): COLORURINE, LABSPEC, [place], [place], [place], [place], [place], [place], [place],  NITRITE, LEUKOCYTESUR in the last 72 hours.  Invalid input(s): APPERANCEUR    Imaging: Dg Chest 2 View  Result Date: 02/19/2018 CLINICAL DATA:  67 year old male with shortness of breath and COPD. EXAM: CHEST - 2 VIEW COMPARISON:  Chest radiograph dated 02/09/2016 FINDINGS: There is emphysematous changes of the lungs with hyperinflation. Diffuse interstitial coarsening. No focal consolidation, pleural effusion, or pneumothorax. The cardiac silhouette is within normal limits. Atherosclerotic calcification of the aortic arch. No acute osseous pathology. Multiple small radiopaque foci over the left chest wall and left upper extremity soft tissues. Clinical correlation is recommended. IMPRESSION: 1. No acute  cardiopulmonary process. 2. Emphysema. Electronically Signed   By: Anner Crete M.D.   On: 02/19/2018 06:13   Ct Soft Tissue Neck W Contrast  Result Date: 02/19/2018 CLINICAL DATA:  67 y/o M; history of tongue cancer post radiation. KNot on the left-sided neck. Squamous cell carcinoma of the nose. EXAM: CT NECK WITH CONTRAST TECHNIQUE: Multidetector CT imaging of the neck was performed using the standard protocol following the bolus administration of intravenous contrast. CONTRAST:  52mL OMNIPAQUE IOHEXOL 300 MG/ML  SOLN COMPARISON:  05/21/2013 PET-CT. FINDINGS: Pharynx and larynx: Diffuse smooth mucosal thickening of the oral and hypopharynx compatible with posttreatment changes. No exophytic enhancing nodular component. Salivary glands: Absent right submandibular gland. Additional salivary glands are unremarkable. Thyroid: Normal. Lymph nodes: Status post right radical neck dissection. No nodular enhancing disease within the right cervical chain to suggest recurrence. Necrotic lymphadenopathy in the left level 2/3 station measuring 2.6 x 2.5 x 3.8 cm (AP x ML x CC series 2, image 51 and series 7, image 60). No additional cervical lymphadenopathy identified. Vascular: Dense calcified plaque of the carotid bifurcations bilaterally with mild to moderate 50% left proximal ICA stenosis. Mild less than 50% proximal right ICA stenosis. The right vertebral artery neck is diffusely small in caliber with thickened wall and multiple areas of stenosis likely representing radiation changes. Limited intracranial: Negative. Visualized orbits: Negative. Mastoids and visualized paranasal sinuses: Moderate mucosal thickening of the maxillary sinuses bilaterally with chronic inflammatory changes of the sinus walls. Left mastoid tip effusion. Normal aeration of the right mastoid air cells. Skeleton: Moderate cervical spondylosis. No acute osseous abnormality identified. No high-grade bony canal stenosis. Upper chest: Mild  centrilobular emphysema of the upper lobes. Clustered ground-glass opacities and mucous plugging in the left upper lobe, likely bronchopneumonia. Other: None. IMPRESSION: 1. Necrotic lymphadenopathy measuring up to 3.8 cm at the left level 2/3 cervical node station, likely metastatic. No additional lymphadenopathy identified. 2. Posttreatment changes of the oral and hypopharyngeal mucosa and postsurgical changes related to right cervical node dissection. 3. Moderate mucosal thickening of the maxillary sinuses and left mastoid tip effusion. 4. Calcific atherosclerosis of carotid bifurcations with mild to moderate 50% left proximal ICA stenosis. 5. Left upper lobe ground-glass opacities in bronchovascular distribution, likely bronchopneumonia. Electronically Signed   By: Kristine Garbe M.D.   On: 02/19/2018 04:36   US Renal  Result Date: 02/19/2018 CLINICAL DATA:  Chronic renal disease EXAM: RENAL / URINARY TRACT ULTRASOUND COMPLETE COMPARISON:  None. FINDINGS: Right Kidney: Length: 10.2 cm. Echogenicity and renal cortical thickness are within normal limits. No mass, perinephric fluid, or hydronephrosis visualized. No sonographically demonstrable calculus or ureterectasis. Left Kidney: Length: 10.0 cm. Echogenicity and renal cortical thickness are within normal limits. No mass, perinephric fluid, or hydronephrosis visualized. There are nonobstructing calculi in the left kidney, largest measuring 9 mm. No ureterectasis. Bladder: Appears normal for degree of bladder  distention. IMPRESSION: Nonobstructing calculi in left kidney. Study otherwise unremarkable. Electronically Signed   By: Lowella Grip III M.D.   On: 02/19/2018 12:00     Medications:   . sodium chloride 250 mL (02/19/18 1350)   . amoxicillin-clavulanate  1 tablet Oral Q12H  . DULoxetine  30 mg Oral Daily  . fluticasone  1 spray Each Nare Daily  . folic acid  1 mg Oral Daily  . heparin  5,000 Units Subcutaneous Q8H  .  ipratropium-albuterol  3 mL Nebulization Q6H  . [START ON 02/20/2018] levothyroxine  50 mcg Oral QAC breakfast  . metroNIDAZOLE  500 mg Oral Q8H  . mometasone-formoterol  2 puff Inhalation BID  . multivitamin with minerals  1 tablet Oral Daily  . nicotine  14 mg Transdermal Daily  . sodium chloride flush  3 mL Intravenous Q12H  . sodium chloride  2 g Oral TID WC  . thiamine  100 mg Oral Daily   Or  . thiamine  100 mg Intravenous Daily   sodium chloride, acetaminophen **OR** acetaminophen, clonazePAM, HYDROcodone-acetaminophen, LORazepam **OR** LORazepam, morphine injection, ondansetron **OR** ondansetron (ZOFRAN) IV, polyethylene glycol, sodium chloride flush  Assessment/ Plan:  Thomas [name] is a 67 y.o. white male with history of head and neck cancer 2014, alcohol abuse, COPD, tobacco use, hypertension, seizure disorder who is admitted to [facility] on 02/19/2018 for Hyponatremia [E87.1] Cervical lymphadenopathy [R59.0] CKD (chronic kidney disease) [N18.9]  1. Hyponatremia: exam is consistent with hypovolemia. Hypothyroidism could be a factor along with SIADH - Check serum sodium level - Continue PO sodium chloride - on fluid restriction  2. Chronic Kidney Disease stage III: Creatinine on admission of 1.69, trended down  to 1.2.  - Discussed CKD with patient and family.   3. Hypertension: elevated in the ED. Currently not on any blood pressure medications.   4. Hypothyroidism: TSH elevated.  - restarted on levothyroxine   LOS: 0 Kaesyn Johnston 7/16/20195:04 PM

## 2018-02-19 NOTE — ED Provider Notes (Signed)
There is been a significant delay in admission for this patient, family is quite upset.  Admitting team has been notified   Lavonia Drafts, MD 02/19/18 1007

## 2018-02-19 NOTE — Consult Note (Signed)
Consultation Note Date: 02/19/2018   Patient Name: Thomas [name]  DOB: [birth year]  MRN: [id number]  Age / Sex: 67 y.o., male  PCP: [facility] Referring Physician: Gorden Harms, MD  Reason for Consultation: Establishing goals of care  HPI/Patient Profile: [name]  is a 67 y.o. male with a known history per below, tongue cancer status post chemo.  Clinical Assessment and Goals of Care: Patient resting in bed. His daughter is at bedside. She is speaking with the bank, and bank wants to speak with him. He has difficulty answering their questions and difficulty hearing.   His daughter states he has lost around 25 pounds in the past 6 months. He has been eating less. Over the past 2 months, he has spent much of his days in bed only getting up to go to the bathroom.  He drinks around 1-2 beers every 2 days.   His daughter states she understands he has cancer again. We discussed his prior cancer. He states he did well with the treatments. We discussed his GOC. The difference between an aggressive medical intervention path and a hospice comfort care path was discussed.    He states he was in a coma for 19 days in the past, and he had a friend who was in a coma for years. He states he would want any care possible.    They are waiting for chaplain to complete POA paperwork. There are 3 children. He would like Frankie to be Media planner.    SUMMARY OF RECOMMENDATIONS   Waiting for oncology. Wants all available care.    Code Status/Advance Care Planning:  Full code    Symptom Management:   Per primary team and Onc.   Palliative Prophylaxis:   Eye Care and Oral Care   Prognosis:   Unable to determine  Discharge Planning: Recommend home with palliative.      Primary Diagnoses: Present on Admission: . Cancer ([place])   I have reviewed the medical record, interviewed the  patient and family, and examined the patient. The following aspects are pertinent.  Past Medical History:  Diagnosis Date  . Cancer of nasal cavities ([place])   . Enlarged prostate   . Hypertension   . Lung infection    no lung cancer  . Radiation    to neck  . Renal disorder   . Skin cancer   . Status post chemotherapy   . Tongue cancer [facility])    Social History   Socioeconomic History  . Marital status: Divorced    Spouse name: Not on file  . Number of children: Not on file  . Years of education: Not on file  . Highest education level: Not on file  Occupational History  . Not on file  Social Needs  . Financial resource strain: Not on file  . Food insecurity:    Worry: Not on file    Inability: Not on file  . Transportation needs:    Medical: Not on file  Non-medical: Not on file  Tobacco Use  . Smoking status: Heavy Tobacco Smoker    Types: Cigarettes  . Smokeless tobacco: Never Used  Substance and Sexual Activity  . Alcohol use: Yes    Comment: 40oz x4 daily  . Drug use: No  . Sexual activity: Not on file  Lifestyle  . Physical activity:    Days per week: Not on file    Minutes per session: Not on file  . Stress: Not on file  Relationships  . Social connections:    Talks on phone: Not on file    Gets together: Not on file    Attends religious service: Not on file    Active member of club or organization: Not on file    Attends meetings of clubs or organizations: Not on file    Relationship status: Not on file  Other Topics Concern  . Not on file  Social History Narrative  . Not on file   Family History  Problem Relation Age of Onset  . Hypertension Mother    Scheduled Meds: . amoxicillin-clavulanate  1 tablet Oral Q12H  . DULoxetine  30 mg Oral Daily  . fluticasone  1 spray Each Nare Daily  . folic acid  1 mg Oral Daily  . heparin  5,000 Units Subcutaneous Q8H  . ipratropium-albuterol  3 mL Nebulization Q6H  . [START ON 02/20/2018] levothyroxine   50 mcg Oral QAC breakfast  . metroNIDAZOLE  500 mg Oral Q8H  . mometasone-formoterol  2 puff Inhalation BID  . multivitamin with minerals  1 tablet Oral Daily  . nicotine  14 mg Transdermal Daily  . sodium chloride flush  3 mL Intravenous Q12H  . sodium chloride  2 g Oral TID WC  . thiamine  100 mg Oral Daily   Or  . thiamine  100 mg Intravenous Daily   Continuous Infusions: . sodium chloride 250 mL (02/19/18 1350)   PRN Meds:.sodium chloride, acetaminophen **OR** acetaminophen, clonazePAM, HYDROcodone-acetaminophen, LORazepam **OR** LORazepam, morphine injection, ondansetron **OR** ondansetron (ZOFRAN) IV, polyethylene glycol, sodium chloride flush Medications Prior to Admission:  Prior to Admission medications   Medication Sig Start Date End Date Taking? Authorizing Provider  budesonide-formoterol (SYMBICORT) 80-4.5 MCG/ACT inhaler Inhale 2 puffs into the lungs 2 (two) times daily.    [provider]  clonazePAM (KLONOPIN) 1 MG tablet Take 1 mg by mouth 3 (three) times daily as needed for anxiety.    [provider]  DULoxetine (CYMBALTA) 30 MG capsule Take 30 mg by mouth daily.    [provider]  fluticasone (FLONASE) 50 MCG/ACT nasal spray Place 1 spray into both nostrils daily.    [provider]  levothyroxine (SYNTHROID, LEVOTHROID) 50 MCG tablet Take 1 tablet (50 mcg total) by mouth daily before breakfast. Patient not taking: Reported on 02/19/2018 02/09/16   [name], MD   No Known Allergies Review of Systems  Gastrointestinal:       Weight loss  Neurological: Positive for weakness.    Physical Exam  Constitutional:  Thin and frail  Pulmonary/Chest: Effort normal.  Neurological: He is alert.  Oriented.    Vital Signs: BP (!) 155/95 (BP Location: Right Arm)   Pulse 84   Temp (!) 97.4 F (36.3 C)   Resp 18   Ht 5\' 7"  (1.702 m)   Wt 48.5 kg (107 lb) Comment: pt wasn't weighed in the ED  SpO2 100%   BMI 16.76 kg/m    Pain Scale: 0-10  Pain Score: 8    SpO2: SpO2: 100 % O2 Device:SpO2: 100 % O2 Flow Rate: .   IO: Intake/output summary: No intake or output data in the 24 hours ending 02/19/18 1554  LBM: Last BM Date: 02/18/18 Baseline Weight: Weight: 61.2 kg (135 lb) Most recent weight: Weight: 48.5 kg (107 lb)(pt wasn't weighed in the ED)     Palliative Assessment/Data: 40%     Time In: 3:00 Time Out: 4:10 Time Total: 70 min Greater than 50%  of this time was spent counseling and coordinating care related to the above assessment and plan.  Signed by: Asencion Gowda, NP   Please contact Palliative Medicine Team phone at [phone] for questions and concerns.  For individual provider: See Shea Evans

## 2018-02-19 NOTE — ED Provider Notes (Signed)
[facility] Emergency Department Provider Note  Seen: 2:41 AM  I have reviewed the triage vital signs and the nursing notes.   HISTORY  Chief Complaint Neck Pain    HPI Thomas [name] is a 66 y.o. 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 intake loss of appetite.   Past Medical History:  Diagnosis Date  . 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 Enlarged prostate   . Hypertension   . Lung infection    no lung cancer  . Radiation    to neck  . Renal disorder   . Skin cancer   . Status post chemotherapy   . Tongue cancer [facility])     Patient Active Problem List   Diagnosis Date Noted  . Protein-calorie malnutrition, severe 01/22/2016  . Alcohol use disorder, severe, dependence ([place]) 12/12/2014  . Hyponatremia 12/12/2014  . Essential hypertension 12/12/2014  . COPD (chronic obstructive pulmonary disease) ([place]) 12/12/2014    Past Surgical History:  Procedure Laterality Date  . cancerous lymph nodes removed from neck    . KNEE SURGERY Right   . SKIN BIOPSY      Prior to Admission medications   Medication Sig Start Date End Date Taking? Authorizing Provider  amLODipine (NORVASC) 5 MG tablet Take 1 tablet (5 mg total) by mouth daily. Patient not taking: Reported on 02/19/2018 02/09/16   [name], MD  budesonide-formoterol [facility]) 80-4.5 MCG/ACT inhaler Inhale 2 puffs into the lungs 2 (two) times daily.    [provider]  clonazePAM (KLONOPIN) 1 MG tablet Take 1 mg by mouth 3 (three) times daily as needed for anxiety.    [provider]  cloNIDine (CATAPRES) 0.1 MG tablet Take 1 tablet (0.1 mg total) by mouth 2 (two) times daily. Patient not taking: Reported on 02/19/2018 02/09/16   [name], MD    DULoxetine (CYMBALTA) 30 MG capsule Take 30 mg by mouth daily.    [provider]  fluticasone (FLONASE) 50 MCG/ACT nasal spray Place 1 spray into both nostrils daily.    [provider]  Fluticasone-Salmeterol (ADVAIR DISKUS) 250-50 MCG/DOSE AEPB Inhale 1 puff into the lungs 2 (two) times daily. Patient not taking: Reported on 02/19/2018 01/25/16   [name], MD  levothyroxine (SYNTHROID, LEVOTHROID) 50 MCG tablet Take 1 tablet (50 mcg total) by mouth daily before breakfast. Patient not taking: Reported on 02/19/2018 02/09/16   [name], MD  metoprolol (LOPRESSOR) 50 MG tablet Take 1 tablet (50 mg total) by mouth 2 (two) times daily. Patient not taking: Reported on 02/19/2018 02/09/16   [name], MD  mirtazapine (REMERON) 7.5 MG tablet Take 1 tablet (7.5 mg total) by mouth at bedtime. Patient not taking: Reported on 02/19/2018 02/09/16   [name], MD  Multiple Vitamin (MULTIVITAMIN WITH MINERALS) TABS tablet Take 1 tablet by mouth daily. Patient not taking: Reported on 02/19/2018 02/09/16   [name], MD  tiotropium (SPIRIVA) 18 MCG inhalation capsule Place 1 capsule (18 mcg total) into inhaler and inhale daily. Patient not taking: Reported on 02/19/2018 01/25/16   [name], MD    Allergies No known drug allergies  Family History  Problem Relation Age of Onset  . Hypertension Mother     Social History Social History   Tobacco Use  .  Smoking status: Heavy Tobacco Smoker    Types: Cigarettes  . Smokeless tobacco: Never Used  Substance Use Topics  . Alcohol use: Yes    Comment: 40oz x4 daily  . Drug use: No    Review of Systems Constitutional: No fever/chills Eyes: No visual changes. ENT: No sore throat.  Positive for left neck swelling Cardiovascular: Denies chest pain. Respiratory: Denies shortness of breath. Gastrointestinal: No abdominal pain.  No nausea, no vomiting.  No diarrhea.  No  constipation. Genitourinary: Negative for dysuria. Musculoskeletal: Negative for neck pain.  Negative for back pain. Integumentary: Negative for rash. Neurological: Negative for headaches, focal weakness or numbness.  ____________________________________________   PHYSICAL EXAM:  VITAL SIGNS: ED Triage Vitals  Enc Vitals Group     BP 02/18/18 2321 114/76     Pulse Rate 02/18/18 2321 (!) 104     Resp 02/18/18 2321 17     Temp 02/18/18 2321 98.5 F (36.9 C)     Temp Source 02/18/18 2321 Oral     SpO2 02/18/18 2321 98 %     Weight 02/18/18 2322 61.2 kg (135 lb)     Height 02/18/18 2322 1.702 m (5\' 7" )     Head Circumference --      Peak Flow --      Pain Score 02/18/18 2322 8     Pain Loc --      Pain Edu? --      Excl. in [place]? --     Constitutional: Alert and oriented. Well appearing and in no acute distress. Eyes: Conjunctivae are normal.  Head: Atraumatic. Ears:  Healthy appearing ear canals and TMs bilaterally Nose: No congestion/rhinnorhea. Mouth/Throat: Mucous membranes are dry  Oropharynx non-erythematous. Neck: No stridor.  indurated swelling noted to the left submandibular/left anterior neck Cardiovascular: Normal rate, regular rhythm. Good peripheral circulation. Grossly normal heart sounds. Respiratory: Normal respiratory effort.  No retractions. Lungs CTAB. Gastrointestinal: Soft and nontender. No distention.  Musculoskeletal: No lower extremity tenderness nor edema. No gross deformities of extremities. Neurologic:  Normal speech and language. No gross focal neurologic deficits are appreciated.  Skin:  Skin is warm, dry and intact. No rash noted. Psychiatric: Mood and affect are normal. Speech and behavior are normal.  ____________________________________________   LABS (all labs ordered are listed, but only abnormal results are displayed)  Labs Reviewed  BASIC METABOLIC PANEL - Abnormal; Notable for the following components:      Result Value   Sodium  124 (*)    Chloride 88 (*)    Glucose, Bld 240 (*)    BUN 33 (*)    Creatinine, Ser 1.69 (*)    GFR calc non Af Amer 40 (*)    GFR calc Af Amer 47 (*)    All other components within normal limits  CBC - Abnormal; Notable for the following components:   RBC 4.35 (*)    Hemoglobin 12.6 (*)    HCT 37.2 (*)    All other components within normal limits   __________________________ RADIOLOGY I, Socorro N BROWN, personally viewed and evaluated these images (plain radiographs) as part of my medical decision making, as well as reviewing the written report by the radiologist.  ED MD interpretation: Necrotic cervical lymphadenopathy likely metastastic per radiologist.  Official radiology report(s): Ct Soft Tissue Neck W Contrast  Result Date: 02/19/2018 CLINICAL DATA:  67 y/o M; history of tongue cancer post radiation. KNot on the left-sided neck. Squamous cell carcinoma of the nose. EXAM: CT NECK  WITH CONTRAST TECHNIQUE: Multidetector CT imaging of the neck was performed using the standard protocol following the bolus administration of intravenous contrast. CONTRAST:  64mL OMNIPAQUE IOHEXOL 300 MG/ML  SOLN COMPARISON:  05/21/2013 PET-CT. FINDINGS: Pharynx and larynx: Diffuse smooth mucosal thickening of the oral and hypopharynx compatible with posttreatment changes. No exophytic enhancing nodular component. Salivary glands: Absent right submandibular gland. Additional salivary glands are unremarkable. Thyroid: Normal. Lymph nodes: Status post right radical neck dissection. No nodular enhancing disease within the right cervical chain to suggest recurrence. Necrotic lymphadenopathy in the left level 2/3 station measuring 2.6 x 2.5 x 3.8 cm (AP x ML x CC series 2, image 51 and series 7, image 60). No additional cervical lymphadenopathy identified. Vascular: Dense calcified plaque of the carotid bifurcations bilaterally with mild to moderate 50% left proximal ICA stenosis. Mild less than 50% proximal right  ICA stenosis. The right vertebral artery neck is diffusely small in caliber with thickened wall and multiple areas of stenosis likely representing radiation changes. Limited intracranial: Negative. Visualized orbits: Negative. Mastoids and visualized paranasal sinuses: Moderate mucosal thickening of the maxillary sinuses bilaterally with chronic inflammatory changes of the sinus walls. Left mastoid tip effusion. Normal aeration of the right mastoid air cells. Skeleton: Moderate cervical spondylosis. No acute osseous abnormality identified. No high-grade bony canal stenosis. Upper chest: Mild centrilobular emphysema of the upper lobes. Clustered ground-glass opacities and mucous plugging in the left upper lobe, likely bronchopneumonia. Other: None. IMPRESSION: 1. Necrotic lymphadenopathy measuring up to 3.8 cm at the left level 2/3 cervical node station, likely metastatic. No additional lymphadenopathy identified. 2. Posttreatment changes of the oral and hypopharyngeal mucosa and postsurgical changes related to right cervical node dissection. 3. Moderate mucosal thickening of the maxillary sinuses and left mastoid tip effusion. 4. Calcific atherosclerosis of carotid bifurcations with mild to moderate 50% left proximal ICA stenosis. 5. Left upper lobe ground-glass opacities in bronchovascular distribution, likely bronchopneumonia. Electronically Signed   By: Kristine Garbe M.D.   On: 02/19/2018 04:36      Procedures   ____________________________________________   INITIAL IMPRESSION / ASSESSMENT AND PLAN / ED COURSE  As part of my medical decision making, I reviewed the following data within the electronic MEDICAL RECORD NUMBER   67 year old male presenting with above-stated history and physical exam concerning for possible neoplasm given previous history of cancer.  CT scan revealed necrotic cervical lymphadenopathy likely metastatic per radiologist.  Laboratory data notable for sodium of 124 with  an elevated creatinine of 1.69 BUN 33.  Patient also markedly dehydrated on clinical exam and as such 2 L IV normal saline was administered secondary to hyponatremia and dehydration.  Patient discussed with Dr. Marcille Blanco for hospital admission further evaluation and management  ___________________________________________  FINAL CLINICAL IMPRESSION(S) / ED DIAGNOSES  Final diagnoses:  Hyponatremia  Cervical lymphadenopathy     MEDICATIONS GIVEN DURING THIS VISIT:  Medications  sodium chloride 0.9 % bolus 1,000 mL (1,000 mLs Intravenous New Bag/Given 02/19/18 0333)  sodium chloride 0.9 % bolus 1,000 mL (0 mLs Intravenous Stopped 02/19/18 0414)  iohexol (OMNIPAQUE) 300 MG/ML solution 60 mL (60 mLs Intravenous Contrast Given 02/19/18 0357)     ED Discharge Orders    None       Note:  This document was prepared using Dragon voice recognition software and may include unintentional dictation errors.    Gregor Hams, MD 02/19/18 [phone]

## 2018-02-19 NOTE — ED Notes (Signed)
Ultrasound called to come and get pt for ultrasound due to no bed assignment

## 2018-02-19 NOTE — ED Notes (Signed)
Pt still in US

## 2018-02-19 NOTE — Progress Notes (Signed)
Family Meeting Note  Advance Directive:yes  Today a meeting took place with the Patient.  Patient is able to participate   The following clinical team members were present during this meeting:MD  The following were discussed:Patient's diagnosis: Head neck cancer with severe malnutrition, Patient's progosis: Unable to determine and Goals for treatment: Full Code  Additional follow-up to be provided: prn  Time spent during discussion:20 minutes  Gorden Harms, MD

## 2018-02-19 NOTE — ED Notes (Addendum)
Pt arrived back from [place]. Pt is alert and oriented. Respirations even and unlabored. Pt appears in NAD at this time. No trembling, shaking, sweating noticed at this time. Daughter at bedside

## 2018-02-19 NOTE — H&P (Addendum)
[facility] [place] NAME: Thomas [name]    MR#:  [id number]  DATE OF BIRTH:  05/08/1951  DATE OF ADMISSION:  02/19/2018  PRIMARY CARE PHYSICIAN: Shari Prows, [facility]   REQUESTING/REFERRING PHYSICIAN:   CHIEF COMPLAINT:   Chief Complaint  Patient presents with  . Neck Pain    HISTORY OF PRESENT ILLNESS: Thomas [name]  is a 67 y.o. male with a known history per below, tongue cancer status post chemo-patient states that he does not know the specifics around whether or not his cancer was cured or whether or not he is currently undergoing treatment/does not know his cancer doctor/treated in Derm per patient, patient is poor historian, family not available, presenting to the emergency room with left neck swelling x1 month with associated weight loss, poor p.o. intake, ER work-up noted for sodium 124, chloride 88, creatinine 1.6, CT neck noted for necrotic lymphadenopathy 3.8 cm likely metastatic/postsurgical changes from neck dissection prior/maxillary sinus disease/left ICA stenosis 50%/left bronchopneumonia, chest x-ray noted for COPD changes only, patient evaluated in the emergency room, in no apparent distress, resting comfortably in bed, eating food, no evidence for hypoxia, patient states that his daughter knows his information, patient states I do not know was going on with me, patient is now been admitted for ?  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  PAST MEDICAL HISTORY:   Past Medical History:  Diagnosis Date  . Cancer of nasal cavities ([place])   . Enlarged prostate   . Hypertension   . Lung infection    no lung cancer  . Radiation    to neck  . Renal disorder   . Skin cancer   . Status post chemotherapy   . Tongue cancer ([place])     PAST SURGICAL HISTORY:  Past Surgical History:  Procedure Laterality Date  . cancerous lymph nodes removed from neck     . KNEE SURGERY Right   . SKIN BIOPSY      SOCIAL HISTORY:  Social History   Tobacco Use  . Smoking status: Heavy Tobacco Smoker    Types: Cigarettes  . Smokeless tobacco: Never Used  Substance Use Topics  . Alcohol use: Yes    Comment: 40oz x4 daily    FAMILY HISTORY:  Family History  Problem Relation Age of Onset  . Hypertension Mother     DRUG ALLERGIES: No Known Allergies  REVIEW OF SYSTEMS: Poor historian due to mental illness/bipolar  CONSTITUTIONAL: No fever, fatigue or weakness.  EYES: No blurred or double vision.  EARS, NOSE, AND THROAT: No tinnitus or ear pain.  RESPIRATORY: No cough, shortness of breath, wheezing or hemoptysis.  CARDIOVASCULAR: No chest pain, orthopnea, edema.  GASTROINTESTINAL: No nausea, vomiting, diarrhea or abdominal pain.  GENITOURINARY: No dysuria, hematuria.  ENDOCRINE: No polyuria, nocturia,  HEMATOLOGY: No anemia, easy bruising or bleeding SKIN: No rash or lesion. MUSCULOSKELETAL: No joint pain or arthritis.   NEUROLOGIC: No tingling, numbness, weakness.  PSYCHIATRY: No anxiety or depression.   MEDICATIONS AT HOME:  Prior to Admission medications   Medication Sig Start Date End Date Taking? Authorizing Provider  budesonide-formoterol (SYMBICORT) 80-4.5 MCG/ACT inhaler Inhale 2 puffs into the lungs 2 (two) times daily.    [provider]  clonazePAM (KLONOPIN) 1 MG tablet Take 1 mg by mouth 3 (three) times daily as needed for anxiety.    [provider]  DULoxetine (  CYMBALTA) 30 MG capsule Take 30 mg by mouth daily.    [provider]  fluticasone (FLONASE) 50 MCG/ACT nasal spray Place 1 spray into both nostrils daily.    [provider]  levothyroxine (SYNTHROID, LEVOTHROID) 50 MCG tablet Take 1 tablet (50 mcg total) by mouth daily before breakfast. Patient not taking: Reported on 02/19/2018 02/09/16   [name], MD      PHYSICAL EXAMINATION:   VITAL SIGNS: Blood pressure (!) 151/86,  pulse 87, temperature 98.5 F (36.9 C), temperature source Oral, resp. rate 18, height 5\' 7"  (1.702 m), weight 61.2 kg (135 lb), SpO2 98 %.  GENERAL:  67 y.o.-year-old patient lying in the bed with no acute distress.  Frail/malnourished-appearing EYES: Pupils equal, round, reactive to light and accommodation. No scleral icterus. Extraocular muscles intact.  HEENT: Head atraumatic, normocephalic. Oropharynx and nasopharynx clear.  NECK:  Supple, no jugular venous distention. No thyroid enlargement, no tenderness.  LUNGS: Bilateral diffuse rhonchi. No use of accessory muscles of respiration.  CARDIOVASCULAR: S1, S2 normal. No murmurs, rubs, or gallops.  ABDOMEN: Soft, nontender, nondistended. Bowel sounds present. No organomegaly or mass.  EXTREMITIES: No pedal edema, cyanosis, or clubbing.  Diffuse muscular wasting NEUROLOGIC: Cranial nerves II through XII are intact. MAES. Gait not checked.  PSYCHIATRIC: The patient is alert and oriented x 3.  SKIN: No obvious rash, lesion, or ulcer.   LABORATORY PANEL:   CBC Recent Labs  Lab 02/18/18 2327  WBC 7.7  HGB 12.6*  HCT 37.2*  PLT 365  MCV 85.4  MCH 28.9  MCHC 33.8  RDW 14.2   ------------------------------------------------------------------------------------------------------------------  Chemistries  Recent Labs  Lab 02/18/18 2327  NA 124*  K 3.7  CL 88*  CO2 24  GLUCOSE 240*  BUN 33*  CREATININE 1.69*  CALCIUM 9.1   ------------------------------------------------------------------------------------------------------------------ estimated creatinine clearance is 36.7 mL/min (A) (by C-G formula based on SCr of 1.69 mg/dL (H)). ------------------------------------------------------------------------------------------------------------------ No results for input(s): TSH, T4TOTAL, T3FREE, THYROIDAB in the last 72 hours.  Invalid input(s): FREET3   Coagulation profile No results for input(s): INR, PROTIME in the last  168 hours. ------------------------------------------------------------------------------------------------------------------- No results for input(s): DDIMER in the last 72 hours. -------------------------------------------------------------------------------------------------------------------  Cardiac Enzymes No results for input(s): CKMB, TROPONINI, MYOGLOBIN in the last 168 hours.  Invalid input(s): CK ------------------------------------------------------------------------------------------------------------------ Invalid input(s): POCBNP  ---------------------------------------------------------------------------------------------------------------  Urinalysis    Component Value Date/Time   COLORURINE YELLOW (A) 02/09/2016 1731   APPEARANCEUR CLEAR (A) 02/09/2016 1731   APPEARANCEUR Clear 02/19/2013 0757   LABSPEC 1.009 02/09/2016 1731   LABSPEC 1.016 02/19/2013 0757   PHURINE 6.0 02/09/2016 1731   GLUCOSEU NEGATIVE 02/09/2016 1731   GLUCOSEU 150 mg/dL 02/19/2013 0757   HGBUR NEGATIVE 02/09/2016 1731   BILIRUBINUR NEGATIVE 02/09/2016 1731   BILIRUBINUR Negative 02/19/2013 0757   KETONESUR NEGATIVE 02/09/2016 1731   PROTEINUR NEGATIVE 02/09/2016 1731   UROBILINOGEN 0.2 06/02/2014 2111   NITRITE NEGATIVE 02/09/2016 1731   LEUKOCYTESUR NEGATIVE 02/09/2016 1731   LEUKOCYTESUR Negative 02/19/2013 0757     RADIOLOGY: Dg Chest 2 View  Result Date: 02/19/2018 CLINICAL DATA:  67 year old male with shortness of breath and COPD. EXAM: CHEST - 2 VIEW COMPARISON:  Chest radiograph dated 02/09/2016 FINDINGS: There is emphysematous changes of the lungs with hyperinflation. Diffuse interstitial coarsening. No focal consolidation, pleural effusion, or pneumothorax. The cardiac silhouette is within normal limits. Atherosclerotic calcification of the aortic arch. No acute osseous pathology. Multiple small radiopaque foci over the left chest wall and left upper extremity soft  tissues.  Clinical correlation is recommended. IMPRESSION: 1. No acute cardiopulmonary process. 2. Emphysema. Electronically Signed   By: Anner Crete M.D.   On: 02/19/2018 06:13   Ct Soft Tissue Neck W Contrast  Result Date: 02/19/2018 CLINICAL DATA:  67 y/o M; history of tongue cancer post radiation. KNot on the left-sided neck. Squamous cell carcinoma of the nose. EXAM: CT NECK WITH CONTRAST TECHNIQUE: Multidetector CT imaging of the neck was performed using the standard protocol following the bolus administration of intravenous contrast. CONTRAST:  59mL OMNIPAQUE IOHEXOL 300 MG/ML  SOLN COMPARISON:  05/21/2013 PET-CT. FINDINGS: Pharynx and larynx: Diffuse smooth mucosal thickening of the oral and hypopharynx compatible with posttreatment changes. No exophytic enhancing nodular component. Salivary glands: Absent right submandibular gland. Additional salivary glands are unremarkable. Thyroid: Normal. Lymph nodes: Status post right radical neck dissection. No nodular enhancing disease within the right cervical chain to suggest recurrence. Necrotic lymphadenopathy in the left level 2/3 station measuring 2.6 x 2.5 x 3.8 cm (AP x ML x CC series 2, image 51 and series 7, image 60). No additional cervical lymphadenopathy identified. Vascular: Dense calcified plaque of the carotid bifurcations bilaterally with mild to moderate 50% left proximal ICA stenosis. Mild less than 50% proximal right ICA stenosis. The right vertebral artery neck is diffusely small in caliber with thickened wall and multiple areas of stenosis likely representing radiation changes. Limited intracranial: Negative. Visualized orbits: Negative. Mastoids and visualized paranasal sinuses: Moderate mucosal thickening of the maxillary sinuses bilaterally with chronic inflammatory changes of the sinus walls. Left mastoid tip effusion. Normal aeration of the right mastoid air cells. Skeleton: Moderate cervical spondylosis. No acute osseous abnormality  identified. No high-grade bony canal stenosis. Upper chest: Mild centrilobular emphysema of the upper lobes. Clustered ground-glass opacities and mucous plugging in the left upper lobe, likely bronchopneumonia. Other: None. IMPRESSION: 1. Necrotic lymphadenopathy measuring up to 3.8 cm at the left level 2/3 cervical node station, likely metastatic. No additional lymphadenopathy identified. 2. Posttreatment changes of the oral and hypopharyngeal mucosa and postsurgical changes related to right cervical node dissection. 3. Moderate mucosal thickening of the maxillary sinuses and left mastoid tip effusion. 4. Calcific atherosclerosis of carotid bifurcations with mild to moderate 50% left proximal ICA stenosis. 5. Left upper lobe ground-glass opacities in bronchovascular distribution, likely bronchopneumonia. Electronically Signed   By: Kristine Garbe M.D.   On: 02/19/2018 04:36    EKG: Orders placed or performed during the hospital encounter of 02/09/16  . ED EKG  . ED EKG  . ED EKG  . ED EKG  . EKG 12-Lead  . EKG 12-Lead    IMPRESSION AND PLAN: *Acute recurrent? 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 *Acute on chronic hyponatremia *Acute on chronic severe malnutrition secondary to above *Acute CAP  *Acute dysphasia *History of alcohol abuse *History of bipolar illness *COPD without exacerbation *CKD III  Referred to the observation unit, oncology to see given probable head neck cancer recurrence-patient does not know whether or not he was cured from his original tongue cancer, consult otolaryngology as patient may require biopsy to confirm recurrence -  suspect may be done as outpatient, pneumonia protocol, empiric Augmentin/Flagyl as patient probably has aspiration pneumonia from dysphagia given history of head neck cancer status post surgical resection/chemo in the past, speech therapy to evaluate/treat, consult nephrology for acute on chronic hyponatremia due to  SIADH, fluid restriction with salt tabs for now, BMP in the morning, placed on alcohol withdrawal protocol, aspiration/fall  precautions while in house, dietary to see, continue close medical monitoring    All the records are reviewed and case discussed with ED provider. Management plans discussed with the patient, family and they are in agreement.  CODE STATUS:full Code Status History    Date Active Date Inactive Code Status Order ID Comments User Context   02/19/2016 1355 02/22/2016 2000 Full Code [id number]  Lytle Butte, MD ED   02/07/2016 0230 02/09/2016 1859 Full Code [id number]  Harrie Foreman, MD Inpatient   01/22/2016 0552 01/26/2016 1805 Full Code [id number]  Harrie Foreman, MD Inpatient   08/17/2015 2229 08/21/2015 1800 Full Code [id number]  Lance Coon, MD Inpatient   12/12/2014 0119 12/18/2014 1659 Full Code [id number]  Hower, Aaron Mose, MD ED       TOTAL TIME TAKING CARE OF THIS PATIENT: 45 minutes.    Avel Peace Arlet Marter M.D on 02/19/2018   Between 7am to 6pm - Pager - [phone]  After 6pm go to [email or web address] - password [email or web address] [facility]  Office  [phone]  CC: Primary care physician; [facility]   Note: This dictation was prepared with Dragon dictation along with smaller phrase technology. Any transcriptional errors that result from this process are unintentional.

## 2018-02-19 NOTE — ED Notes (Signed)
[place] pt daughter

## 2018-02-19 NOTE — ED Notes (Signed)
Admitting doctor paged to see if they were coming to place admitting orders

## 2018-02-19 NOTE — ED Notes (Signed)
Pt assisted to toilet to void 

## 2018-02-19 NOTE — ED Notes (Signed)
Pt given sandwich tray and coffee.  

## 2018-02-19 NOTE — ED Notes (Signed)
Patient transported to Ultrasound 

## 2018-02-20 DIAGNOSIS — J690 Pneumonitis due to inhalation of food and vomit: Secondary | ICD-10-CM | POA: Diagnosis not present

## 2018-02-20 DIAGNOSIS — R634 Abnormal weight loss: Secondary | ICD-10-CM | POA: Diagnosis not present

## 2018-02-20 DIAGNOSIS — Z85810 Personal history of malignant neoplasm of tongue: Secondary | ICD-10-CM | POA: Diagnosis not present

## 2018-02-20 DIAGNOSIS — C760 Malignant neoplasm of head, face and neck: Secondary | ICD-10-CM | POA: Diagnosis not present

## 2018-02-20 DIAGNOSIS — F1010 Alcohol abuse, uncomplicated: Secondary | ICD-10-CM | POA: Diagnosis present

## 2018-02-20 DIAGNOSIS — R64 Cachexia: Secondary | ICD-10-CM | POA: Diagnosis present

## 2018-02-20 DIAGNOSIS — Y92230 Patient room in hospital as the place of occurrence of the external cause: Secondary | ICD-10-CM | POA: Diagnosis not present

## 2018-02-20 DIAGNOSIS — R221 Localized swelling, mass and lump, neck: Secondary | ICD-10-CM | POA: Diagnosis not present

## 2018-02-20 DIAGNOSIS — W19XXXA Unspecified fall, initial encounter: Secondary | ICD-10-CM | POA: Diagnosis not present

## 2018-02-20 DIAGNOSIS — Z515 Encounter for palliative care: Secondary | ICD-10-CM | POA: Diagnosis not present

## 2018-02-20 DIAGNOSIS — R590 Localized enlarged lymph nodes: Secondary | ICD-10-CM | POA: Diagnosis not present

## 2018-02-20 DIAGNOSIS — R0603 Acute respiratory distress: Secondary | ICD-10-CM | POA: Diagnosis not present

## 2018-02-20 DIAGNOSIS — N179 Acute kidney failure, unspecified: Secondary | ICD-10-CM | POA: Diagnosis present

## 2018-02-20 DIAGNOSIS — N401 Enlarged prostate with lower urinary tract symptoms: Secondary | ICD-10-CM | POA: Diagnosis present

## 2018-02-20 DIAGNOSIS — R627 Adult failure to thrive: Secondary | ICD-10-CM | POA: Diagnosis present

## 2018-02-20 DIAGNOSIS — Z7189 Other specified counseling: Secondary | ICD-10-CM | POA: Diagnosis not present

## 2018-02-20 DIAGNOSIS — E860 Dehydration: Secondary | ICD-10-CM | POA: Diagnosis present

## 2018-02-20 DIAGNOSIS — J440 Chronic obstructive pulmonary disease with acute lower respiratory infection: Secondary | ICD-10-CM | POA: Diagnosis not present

## 2018-02-20 DIAGNOSIS — I480 Paroxysmal atrial fibrillation: Secondary | ICD-10-CM | POA: Diagnosis not present

## 2018-02-20 DIAGNOSIS — R338 Other retention of urine: Secondary | ICD-10-CM | POA: Diagnosis not present

## 2018-02-20 DIAGNOSIS — E861 Hypovolemia: Secondary | ICD-10-CM | POA: Diagnosis present

## 2018-02-20 DIAGNOSIS — Z681 Body mass index (BMI) 19 or less, adult: Secondary | ICD-10-CM | POA: Diagnosis not present

## 2018-02-20 DIAGNOSIS — J9601 Acute respiratory failure with hypoxia: Secondary | ICD-10-CM | POA: Diagnosis not present

## 2018-02-20 DIAGNOSIS — E43 Unspecified severe protein-calorie malnutrition: Secondary | ICD-10-CM

## 2018-02-20 DIAGNOSIS — I129 Hypertensive chronic kidney disease with stage 1 through stage 4 chronic kidney disease, or unspecified chronic kidney disease: Secondary | ICD-10-CM | POA: Diagnosis present

## 2018-02-20 DIAGNOSIS — C300 Malignant neoplasm of nasal cavity: Secondary | ICD-10-CM | POA: Diagnosis present

## 2018-02-20 DIAGNOSIS — R1310 Dysphagia, unspecified: Secondary | ICD-10-CM | POA: Diagnosis not present

## 2018-02-20 DIAGNOSIS — F17210 Nicotine dependence, cigarettes, uncomplicated: Secondary | ICD-10-CM | POA: Diagnosis not present

## 2018-02-20 DIAGNOSIS — E039 Hypothyroidism, unspecified: Secondary | ICD-10-CM | POA: Diagnosis present

## 2018-02-20 DIAGNOSIS — N183 Chronic kidney disease, stage 3 (moderate): Secondary | ICD-10-CM | POA: Diagnosis present

## 2018-02-20 DIAGNOSIS — E222 Syndrome of inappropriate secretion of antidiuretic hormone: Secondary | ICD-10-CM | POA: Diagnosis present

## 2018-02-20 DIAGNOSIS — C770 Secondary and unspecified malignant neoplasm of lymph nodes of head, face and neck: Secondary | ICD-10-CM | POA: Diagnosis present

## 2018-02-20 DIAGNOSIS — E876 Hypokalemia: Secondary | ICD-10-CM | POA: Diagnosis not present

## 2018-02-20 DIAGNOSIS — E871 Hypo-osmolality and hyponatremia: Secondary | ICD-10-CM | POA: Diagnosis present

## 2018-02-20 DIAGNOSIS — R1312 Dysphagia, oropharyngeal phase: Secondary | ICD-10-CM | POA: Diagnosis not present

## 2018-02-20 LAB — BASIC METABOLIC PANEL
Anion gap: 6 (ref 5–15)
BUN: 18 mg/dL (ref 8–23)
CALCIUM: 8.2 mg/dL — AB (ref 8.9–10.3)
CO2: 26 mmol/L (ref 22–32)
CREATININE: 1.1 mg/dL (ref 0.61–1.24)
Chloride: 98 mmol/L (ref 98–111)
GFR calc Af Amer: >60 mL/min (ref >60)
GLUCOSE: 133 mg/dL — AB (ref 70–99)
Potassium: 3.6 mmol/L (ref 3.5–5.1)
Sodium: 130 mmol/L — ABNORMAL LOW (ref 135–145)

## 2018-02-20 LAB — HIV ANTIBODY (ROUTINE TESTING W REFLEX): HIV SCREEN 4TH GENERATION: NONREACTIVE

## 2018-02-20 MED ORDER — MEGESTROL ACETATE 20 MG PO TABS
40.0000 mg | ORAL_TABLET | Freq: Two times a day (BID) | ORAL | Status: DC
  Administered 2018-02-20 – 2018-02-22 (×4): 40 mg via ORAL
  Filled 2018-02-20 (×18): qty 2

## 2018-02-20 MED ORDER — SODIUM CHLORIDE 0.9 % IV SOLN
INTRAVENOUS | Status: DC
  Administered 2018-02-21: 08:00:00 via INTRAVENOUS

## 2018-02-20 MED ORDER — IPRATROPIUM-ALBUTEROL 0.5-2.5 (3) MG/3ML IN SOLN: 3.0000 mL | Freq: Three times a day (TID) | RESPIRATORY_TRACT | Status: DC

## 2018-02-20 MED ORDER — IPRATROPIUM-ALBUTEROL 0.5-2.5 (3) MG/3ML IN SOLN: 3.0000 mL | Freq: Four times a day (QID) | RESPIRATORY_TRACT | Status: DC | PRN

## 2018-02-20 MED ORDER — ENSURE ENLIVE PO LIQD
237.0000 mL | Freq: Three times a day (TID) | ORAL | Status: DC
  Administered 2018-02-20 – 2018-02-22 (×4): 237 mL via ORAL

## 2018-02-20 NOTE — Evaluation (Signed)
Clinical/Bedside Swallow Evaluation Patient Details  Name: Thomas [name] MRN: [id number] Date of Birth: Apr 17, 1951  Today's Date: 02/20/2018 Time: SLP Start Time (ACUTE ONLY): 1140 SLP Stop Time (ACUTE ONLY): 1240 SLP Time Calculation (min) (ACUTE ONLY): 60 min  Past Medical History:  Past Medical History:  Diagnosis Date  . Cancer of nasal cavities ([place])   . Enlarged prostate   . Hypertension   . Lung infection    no lung cancer  . Radiation    to neck  . Renal disorder   . Skin cancer   . Status post chemotherapy   . Tongue cancer [facility])    Past Surgical History:  Past Surgical History:  Procedure Laterality Date  . cancerous lymph nodes removed from neck    . KNEE SURGERY Right   . SKIN BIOPSY     HPI:  Pt is a 67 y.o. 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 intake, and Sodium of 124. CT neck noted for necrotic lymphadenopathy, left level 2/3 station, measuring 2.6 x 2.5 x 3.8 cm. Likely metastatic. Posttreatment changes of oral and hypopharyngeal mucosa and postsurgical changes related to Right cervical node dissection. 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 He underwent right side neck dissection on 05/13/2014. Per daughter, for the past couple of months, patient has been progressively getting worse.  He lies in his bed all day long, only gets up when he needs to go to bathroom.  Very poor appetite and p.o. intake.  Weight loss.  Also has noticed left neck swelling for couple of months.  Pt endorses swallowing difficulties moreso w/ solids - "sometimes it goes down, and sometimes it doesn't".  Pt's speech is mdoerate+ Dysarthric which is says is baseline since the previous Ca and XRT w/ neck dissection. Pt has been attempting to eat/drink at home stating it is "easier" to drink "my coffee usually".  CXR revealed: No focal consolidation, pleural effusions; emphysematous changes of the lungs with hyperinflation.    Assessment / Plan / Recommendation Clinical Impression  Pt appears to present w/ oropharyngeal phase dysphagia w/ increased risk for aspiration of oral intake. Suspect much of pt's dysphagia is related to his decreased lingual strength/control/coordination during bolus management stemming from his past R base of tongue Ca w/ neck dissection; XRT. Pt had a PEG placement d/t dysphagia during the Ca then but it has since been removed. Pt verbally indicated that his swallowing is "about the same as it always has been" but that he does have any appetite currently. When asked about foods he eats at home, he replied "sometimes it goes down, and sometimes it doesn't". He endorsed drinking liquids seemed "easier". During trials of thin liquids via Cup/Straw, pt demonstated immediate(x1) and delayed coughing(mild x1) inconsistently w/ the trials; he seemed to swallow more easily and in a more controlled manner w/ trials via Straw. Pt exhibited a strong cough response, effective. Pt endorsed coughing at home "sometimes". W/ trials of puree, pt demonstrated min oral phase deficits c/b slower bolus managment and  oral phase time, but w/ time, he cleared adequately; noted a min munching pattern vs rotary movement. W/ a bolus trial of mech soft food, pt demonstrated lengthy mastication time/effort then had to expectorate the bolus trial stating "it won't go down". Pt was able to feed himself w/ setup; he seemed to do best w/ trials of Ensure Enlive via straw. Recommend a Dysphagia level 1(puree) diet  w/ Thin liquids at this time; strict aspiration precautions. Recommend Pills Crushed in puree for safer swallowing - pt has requested this of NSG as well(mix in Chocolate Pudding or ice cream for pt's ease w/). Recommend monitoring of pt's Pulmonary status for any decline or suspicion of aspiration pnuemonia(currently NOT present on CXR). Recommend f/u w/ Oncologist and Dietitian re: overall oral intake and ability to meet nutritional needs orally d/t new Ca suspicion and baseline malnutrition(PEG placement TBD). This was discussed w/ MD and Dietitian/NSG. ST services will be available for any further education as needed.  SLP Visit Diagnosis: Dysphagia, oropharyngeal phase (R13.12)    Aspiration Risk  Mild aspiration risk    Diet Recommendation  Dysphagia level 1 (PUREE) w/ gravies, Soups; Thin liquids. Aspiration precautions.  Medication Administration: Crushed with puree(as able for safer, easier swallowing - in chocolate)    Other  Recommendations Recommended Consults: (Dietitian f/u) Oral Care Recommendations: Oral care BID;Patient independent with oral care Other Recommendations: (n/a)   Follow up Recommendations (TBD)      Frequency and Duration (TBD)  (TBD)       Prognosis Prognosis for Safe Diet Advancement: Guarded(-Fair) Barriers to Reach Goals: Time post onset(old R BOT Ca)      Swallow Study   General Date of Onset: 02/19/18 HPI: Pt is a 67 y.o. 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 intake, and Sodium of 124. CT neck noted for necrotic lymphadenopathy, left level 2/3 station, measuring 2.6 x 2.5 x 3.8 cm. Likely metastatic. Posttreatment  changes of oral and hypopharyngeal mucosa and postsurgical changes related to Right cervical node dissection. 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 He underwent right side neck dissection on 05/13/2014. Per daughter, for the past couple of months, patient has been progressively getting worse.  He lies in his bed all day long, only gets up when he needs to go to bathroom.  Very poor appetite and p.o. intake.  Weight loss.  Also has noticed left neck swelling for couple of months. Pt endorses swallowing difficulties moreso w/ solids - "sometimes it goes down, and sometimes it doesn't".  Pt's speech is mdoerate+ Dysarthric which is says is baseline since the previous Ca and XRT w/ neck dissection. Pt has been attempting to eat/drink at home stating it is "easier" to drink "my coffee usually".  CXR revealed: No focal consolidation, pleural effusions; emphysematous changes of the lungs with hyperinflation.  Type of Study: Bedside Swallow Evaluation Previous Swallow Assessment: ~2015; pt had a PEG placement during the R tongue base Ca per MD(unknown when it was removed).  Diet Prior to this Study: Regular;Thin liquids  Temperature Spikes Noted: No(wbc 7.1) Respiratory Status: Room air History of Recent Intubation: No Behavior/Cognition: Alert;Cooperative;Pleasant mood Oral Cavity Assessment: Dry Oral Care Completed by SLP: Recent completion by staff Oral Cavity - Dentition: Edentulous Vision: Functional for self-feeding Self-Feeding Abilities: Able to feed self;Needs assist;Needs set up Patient Positioning: Upright in bed Baseline Vocal Quality: Normal(dysarthric speech) Volitional Cough: Strong;Congested(min) Volitional Swallow: (effortful d/t dryness)    Oral/Motor/Sensory Function Overall Oral Motor/Sensory Function: Mild  impairment(-Moderate impairment) Facial ROM: Reduced right Facial Symmetry: Abnormal symmetry right Facial Strength: Reduced right Facial Sensation: Reduced right Lingual ROM: Reduced right Lingual Symmetry: Abnormal symmetry right Lingual Strength: Reduced(posteriorly) Lingual Sensation: Within Functional Limits(pt c/o soreness when touching) Velum: (reduced) Mandible: Within Functional Limits   Ice Chips Ice chips: Impaired Presentation: Spoon(fed; 3 trials) Oral Phase Impairments: Reduced lingual movement/coordination Oral Phase Functional Implications: Prolonged oral transit Pharyngeal Phase Impairments: (none)   Thin Liquid Thin Liquid: Impaired Presentation: Cup;Self Fed;Straw(straw: 7 trials; cup: 2 trials) Oral Phase Impairments: Reduced lingual movement/coordination Pharyngeal  Phase Impairments: Cough - Immediate;Cough - Delayed(x1 when drinking via cup; delayed cough via straw x1) Other Comments: pt seemed to have an easier time w/ bolus control and transfer w/ more immediate swallowing when using the straw    Nectar Thick Nectar Thick Liquid: Not tested   Honey Thick Honey Thick Liquid: Not tested   Puree Puree: Impaired(min) Presentation: Spoon;Self Fed(x4 trials) Oral Phase Impairments: Impaired mastication;Reduced lingual movement/coordination(more of a munching movement) Oral Phase Functional Implications: Prolonged oral transit Pharyngeal Phase Impairments: (none) Other Comments: pt cleared w/ min extra time   Solid   GO   Solid: Impaired Presentation: Self Fed(1 trial) Oral Phase Impairments: Reduced lingual movement/coordination;Impaired mastication Oral Phase Functional Implications: Prolonged oral transit;Impaired mastication(continued chewing/masticating) Pharyngeal Phase Impairments: (had to expectorate the bolus)        Orinda Kenner, MS, CCC-SLP [facility] 02/20/2018,4:13 PM

## 2018-02-20 NOTE — Progress Notes (Signed)
Initial Nutrition Assessment  DOCUMENTATION CODES:   Severe malnutrition in context of chronic illness, Underweight  INTERVENTION:  Patient meets criteria for severe chronic malnutrition and is underweight. If patient will be undergoing aggressive cancer treatment, recommend placement of G-tube (or J-tube if patient will be a surgical candidate) prior to initiation of treatment.  Continue Ensure Enlive po TID, each supplement provides 350 kcal and 20 grams of protein. Patient prefers chocolate.  Provide Magic cup TID with meals, each supplement provides 290 kcal and 9 grams of protein. Patient prefers chocolate.  Continue MVI daily, thiamine 810 mg daily, folic acid 1 mg daily.  Monitor magnesium, potassium, and phosphorus daily for at least 3 days, MD to replete as needed, as pt is at risk for refeeding syndrome given severe malnutrition, EtOH abuse.  NUTRITION DIAGNOSIS:   Severe Malnutrition related to chronic illness(COPD, CKD stage III, EtOH abuse, hx H&N cancer, cervical lymphadenopathy concerning for recurrence/metastasis) as evidenced by severe fat depletion, severe muscle depletion.  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  -Plan is for CT-guided biopsy of neck mass. -Diet downgraded to  dysphagia 1 with thin liquids following SLP evaluation.  Attempted to meet with patient at bedside. He was sleeping, but even when he woke up he was mumbling and not able to provide any history. No family members present at bedside to provide any history. Patient choked on applesauce today. He was ordered for Ensure Enlive TID WC and is being started on an appetite stimulant.  Patient unable to provide any weight history and limited weight history available in chart. Patient was 136.1 lbs on 08/17/2015 and 127.7 lbs on 02/21/2016. RD obtained bed scale weight of 51.3 kg (113.1 lbs).   Medications reviewed and include: folic acid 1 mg daily, levothyroxine, Megace 40 mg BID, MVI daily, sodium chloride flush 3 mL Q12hrs IV, sodium chloride tablet 2 grams TID with meals, thiamine 100 mg daily.  Labs reviewed: Sodium 130.  Discussed with RN. Also discussed with SLP. Daughter reported patient has previously had a G-tube.  NUTRITION - FOCUSED PHYSICAL EXAM:    Most Recent Value  Orbital Region  Severe depletion  Upper Arm Region  Severe depletion  Thoracic and Lumbar Region  Severe depletion  Buccal Region  Severe depletion  Temple Region  Severe depletion  Clavicle Bone Region  Severe depletion  Clavicle and Acromion Bone Region  Severe depletion  Scapular Bone Region  Unable to assess  Dorsal Hand  Severe depletion  Patellar Region  Severe depletion  Anterior Thigh Region  Severe depletion  Posterior Calf Region  Severe depletion  Edema (RD Assessment)  None  Hair  Reviewed  Eyes  Unable to assess [unable to assess eyes as pt kept falling asleep]  Mouth  Reviewed [edentulous]  Skin  Reviewed [skin very dry, ecchymosis]  Nails  Reviewed Lennox Pippins long nails]     Diet Order:   Diet Order           DIET - DYS 1 Room service appropriate? Yes with Assist; Fluid consistency: Thin  Diet effective now          EDUCATION NEEDS:   Not appropriate for education at this time  Skin:  Skin  Assessment: Reviewed RN Assessment(ecchymosis to bilateral arms)  Last BM:  PTA (02/18/2018 per chart)  Height:   Ht Readings from Last 1 Encounters:  02/19/18 5\' 7"  (1.702 m)    Weight:   Wt Readings from Last 1 Encounters:  02/20/18 113 lb 1.5 oz (51.3 kg)    Ideal Body Weight:  67.3 kg  BMI:  Body mass index is 17.71 kg/m.  Estimated Nutritional Needs:   Kcal:  1630-1880 (MSJ x 1.3-1.5)  Protein:  75-90 grams (1.5-1.8 grams/kg)  Fluid:  1.6-1.9 L/day (1 mL/kcal)  Willey Blade, MS, RD, LDN Office: [phone] Pager: [phone] After Hours/Weekend Pager: [phone]

## 2018-02-20 NOTE — Progress Notes (Signed)
[facility]  ROUNDING NOTE   Subjective:   Na 130 (124)  Patient having difficulty with swallowing.   Objective:  Vital signs in last 24 hours:  Temp:  [98.2 F (36.8 C)] 98.2 F (36.8 C) (07/16 1911) Pulse Rate:  [93-99] 93 (07/16 1915) Resp:  [16] 16 (07/16 1911) BP: (117)/(84) 117/84 (07/16 1911) SpO2:  [97 %-98 %] 98 % (07/16 1932)  Weight change: -12.7 kg (-28 lb) Filed Weights   02/18/18 2322 02/19/18 1243  Weight: 61.2 kg (135 lb) 48.5 kg (107 lb)    Intake/Output: I/O last 3 completed shifts: In: 140 [I.V.:140] Out: -    Intake/Output this shift:  No intake/output data recorded.  Physical Exam: General: NAD, laying in bed, cachectic  Head: Normocephalic, atraumatic. Moist oral mucosal membranes  Eyes: Anicteric, PERRL  Neck: Supple, trachea midline  Lungs:  Clear to auscultation  Heart: Regular rate and rhythm  Abdomen:  Soft, nontender,   Extremities:  no peripheral edema.  Neurologic: Nonfocal, moving all four extremities  Skin: No lesions        Basic Metabolic Panel: Recent Labs  Lab 02/18/18 2327 02/19/18 1308 02/19/18 1707 02/20/18 0437  NA 124*  --  126* 130*  K 3.7  --  3.6 3.6  CL 88*  --  94* 98  CO2 24  --  26 26  GLUCOSE 240*  --  100* 133*  BUN 33*  --  23 18  CREATININE 1.69* 1.20 1.23 1.10  CALCIUM 9.1  --  8.2* 8.2*    Liver Function Tests: No results for input(s): AST, ALT, ALKPHOS, BILITOT, PROT, ALBUMIN in the last 168 hours. No results for input(s): LIPASE, AMYLASE in the last 168 hours. No results for input(s): AMMONIA in the last 168 hours.  CBC: Recent Labs  Lab 02/18/18 2327 02/19/18 1308  WBC 7.7 7.1  HGB 12.6* 11.5*  HCT 37.2* 33.0*  MCV 85.4 84.8  PLT 365 297    Cardiac Enzymes: No results for input(s): CKTOTAL, CKMB, CKMBINDEX, TROPONINI in the last 168 hours.  BNP: Invalid input(s): POCBNP  CBG: No results for input(s): GLUCAP in the last 168 hours.  Microbiology: Results for  orders placed or performed during the hospital encounter of 02/19/18  Culture, blood (routine x 2) Call MD if unable to obtain prior to antibiotics being given     Status: None (Preliminary result)   Collection Time: 02/19/18  1:08 PM  Result Value Ref Range Status   Specimen Description BLOOD RIGHT ANTECUBITAL  Final   Special Requests   Final    BOTTLES DRAWN AEROBIC AND ANAEROBIC Blood Culture adequate volume   Culture   Final    NO GROWTH < 24 HOURS Performed at [facility], [address], [place], [place] [address]    Report Status PENDING  Incomplete  Culture, blood (routine x 2) Call MD if unable to obtain prior to antibiotics being given     Status: None (Preliminary result)   Collection Time: 02/19/18  1:19 PM  Result Value Ref Range Status   Specimen Description BLOOD LEFT ANTECUBITAL  Final   Special Requests   Final    BOTTLES DRAWN AEROBIC AND ANAEROBIC Blood Culture adequate volume   Culture   Final    NO GROWTH < 24 HOURS Performed at [facility], [address] [place], [place] [address]    Report Status PENDING  Incomplete    Coagulation Studies: No results for input(s): LABPROT, INR  in the last 72 hours.  Urinalysis: No results for input(s): COLORURINE, LABSPEC, PHURINE, GLUCOSEU, HGBUR, BILIRUBINUR, KETONESUR, PROTEINUR, UROBILINOGEN, NITRITE, LEUKOCYTESUR in the last 72 hours.  Invalid input(s): APPERANCEUR    Imaging: Dg Chest 2 View  Result Date: 02/19/2018 CLINICAL DATA:  67 year old male with shortness of breath and COPD. EXAM: CHEST - 2 VIEW COMPARISON:  Chest radiograph dated 02/09/2016 FINDINGS: There is emphysematous changes of the lungs with hyperinflation. Diffuse interstitial coarsening. No focal consolidation, pleural effusion, or pneumothorax. The cardiac silhouette is within normal limits. Atherosclerotic calcification of the aortic arch. No acute osseous pathology. Multiple small radiopaque foci over the left chest wall  and left upper extremity soft tissues. Clinical correlation is recommended. IMPRESSION: 1. No acute cardiopulmonary process. 2. Emphysema. Electronically Signed   By: Anner Crete M.D.   On: 02/19/2018 06:13   Ct Soft Tissue Neck W Contrast  Result Date: 02/19/2018 CLINICAL DATA:  67 y/o M; history of tongue cancer post radiation. KNot on the left-sided neck. Squamous cell carcinoma of the nose. EXAM: CT NECK WITH CONTRAST TECHNIQUE: Multidetector CT imaging of the neck was performed using the standard protocol following the bolus administration of intravenous contrast. CONTRAST:  76mL OMNIPAQUE IOHEXOL 300 MG/ML  SOLN COMPARISON:  05/21/2013 PET-CT. FINDINGS: Pharynx and larynx: Diffuse smooth mucosal thickening of the oral and hypopharynx compatible with posttreatment changes. No exophytic enhancing nodular component. Salivary glands: Absent right submandibular gland. Additional salivary glands are unremarkable. Thyroid: Normal. Lymph nodes: Status post right radical neck dissection. No nodular enhancing disease within the right cervical chain to suggest recurrence. Necrotic lymphadenopathy in the left level 2/3 station measuring 2.6 x 2.5 x 3.8 cm (AP x ML x CC series 2, image 51 and series 7, image 60). No additional cervical lymphadenopathy identified. Vascular: Dense calcified plaque of the carotid bifurcations bilaterally with mild to moderate 50% left proximal ICA stenosis. Mild less than 50% proximal right ICA stenosis. The right vertebral artery neck is diffusely small in caliber with thickened wall and multiple areas of stenosis likely representing radiation changes. Limited intracranial: Negative. Visualized orbits: Negative. Mastoids and visualized paranasal sinuses: Moderate mucosal thickening of the maxillary sinuses bilaterally with chronic inflammatory changes of the sinus walls. Left mastoid tip effusion. Normal aeration of the right mastoid air cells. Skeleton: Moderate cervical  spondylosis. No acute osseous abnormality identified. No high-grade bony canal stenosis. Upper chest: Mild centrilobular emphysema of the upper lobes. Clustered ground-glass opacities and mucous plugging in the left upper lobe, likely bronchopneumonia. Other: None. IMPRESSION: 1. Necrotic lymphadenopathy measuring up to 3.8 cm at the left level 2/3 cervical node station, likely metastatic. No additional lymphadenopathy identified. 2. Posttreatment changes of the oral and hypopharyngeal mucosa and postsurgical changes related to right cervical node dissection. 3. Moderate mucosal thickening of the maxillary sinuses and left mastoid tip effusion. 4. Calcific atherosclerosis of carotid bifurcations with mild to moderate 50% left proximal ICA stenosis. 5. Left upper lobe ground-glass opacities in bronchovascular distribution, likely bronchopneumonia. Electronically Signed   By: Kristine Garbe M.D.   On: 02/19/2018 04:36   US Renal  Result Date: 02/19/2018 CLINICAL DATA:  Chronic renal disease EXAM: RENAL / URINARY TRACT ULTRASOUND COMPLETE COMPARISON:  None. FINDINGS: Right Kidney: Length: 10.2 cm. Echogenicity and renal cortical thickness are within normal limits. No mass, perinephric fluid, or hydronephrosis visualized. No sonographically demonstrable calculus or ureterectasis. Left Kidney: Length: 10.0 cm. Echogenicity and renal cortical thickness are within normal limits. No mass, perinephric fluid, or hydronephrosis visualized. There  are nonobstructing calculi in the left kidney, largest measuring 9 mm. No ureterectasis. Bladder: Appears normal for degree of bladder distention. IMPRESSION: Nonobstructing calculi in left kidney. Study otherwise unremarkable. Electronically Signed   By: Lowella Grip III M.D.   On: 02/19/2018 12:00     Medications:   . sodium chloride 250 mL (02/19/18 1350)   . DULoxetine  30 mg Oral Daily  . fluticasone  1 spray Each Nare Daily  . folic acid  1 mg Oral  Daily  . heparin  5,000 Units Subcutaneous Q8H  . levothyroxine  50 mcg Oral QAC breakfast  . mometasone-formoterol  2 puff Inhalation BID  . multivitamin with minerals  1 tablet Oral Daily  . nicotine  14 mg Transdermal Daily  . sodium chloride flush  3 mL Intravenous Q12H  . sodium chloride  2 g Oral TID WC  . thiamine  100 mg Oral Daily   Or  . thiamine  100 mg Intravenous Daily   sodium chloride, acetaminophen **OR** acetaminophen, clonazePAM, HYDROcodone-acetaminophen, ipratropium-albuterol, LORazepam **OR** LORazepam, morphine injection, ondansetron **OR** ondansetron (ZOFRAN) IV, polyethylene glycol, sodium chloride flush  Assessment/ Plan:  Thomas [name] is a 67 y.o. white male with history of head and neck cancer 2014, alcohol abuse, COPD, tobacco use, hypertension, seizure disorder who is admitted to [facility] on 02/19/2018 for Hyponatremia [E87.1] Cervical lymphadenopathy [R59.0] CKD (chronic kidney disease) [N18.9]  1. Hyponatremia: exam is consistent with hypovolemia. 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 Chronic Kidney Disease stage III: Creatinine on admission of 1.69, trended down  to 1.1 - Discussed CKD with patient and family.   3. Hypertension: elevated in the ED. Currently not on any blood pressure medications.   4. Hypothyroidism: TSH elevated.  - restarted on levothyroxine   LOS: 0 Thomas [name] 7/17/20191:13 PM

## 2018-02-20 NOTE — Progress Notes (Signed)
Hematology/Oncology Progress Note [facility] [phone] [phone]  Patient Care Team: [facility] as PCP - General   Name of the patient: Thomas [name]  [id number]  02-Mar-1951  Date of visit: 02/20/18  INTERVAL HISTORY-  Patient was seen at the bedside.  He is undergoing speech pathology evaluation. No acute overnight event.  Reports no appetite feeling tired. Chronic hearing impairment   Review of systems- Review of Systems  Constitutional: Positive for malaise/fatigue. Negative for chills and fever.  HENT: Positive for hearing loss.        Swallowing difficulty  Eyes: Negative for pain.  Respiratory: Negative for shortness of breath.   Cardiovascular: Negative for chest pain.  Gastrointestinal: Negative for nausea and vomiting.  Genitourinary: Negative for hematuria.  Musculoskeletal: Negative for myalgias.  Skin: Negative for rash.  Neurological: Positive for weakness.  Endo/Heme/Allergies: Does not bruise/bleed easily.  Psychiatric/Behavioral: Negative for depression.    No Known Allergies  Patient Active Problem List   Diagnosis Date Noted  . Cancer ([place]) 02/19/2018  . Cervical lymphadenopathy   . Poor appetite   . Loss of weight   . History of tongue cancer   . Protein-calorie malnutrition, severe 01/22/2016  . Alcohol use disorder, severe, dependence ([place]) 12/12/2014  . Hyponatremia 12/12/2014  . Essential hypertension 12/12/2014  . COPD (chronic obstructive pulmonary disease) ([place]) 12/12/2014     Past Medical History:  Diagnosis Date  . Cancer of nasal cavities ([place])   . Enlarged prostate   . Hypertension   . Lung infection    no lung cancer  . Radiation    to neck  . Renal disorder   . Skin cancer   . Status post chemotherapy   . Tongue cancer [facility])      Past Surgical History:  Procedure Laterality Date  . cancerous lymph nodes removed from neck    . KNEE SURGERY Right   . SKIN  BIOPSY      Social History   Socioeconomic History  . Marital status: Divorced    Spouse name: Not on file  . Number of children: Not on file  . Years of education: Not on file  . Highest education level: Not on file  Occupational History  . Not on file  Social Needs  . Financial resource strain: Not on file  . Food insecurity:    Worry: Not on file    Inability: Not on file  . Transportation needs:    Medical: Not on file    Non-medical: Not on file  Tobacco Use  . Smoking status: Heavy Tobacco Smoker    Types: Cigarettes  . Smokeless tobacco: Never Used  Substance and Sexual Activity  . Alcohol use: Yes    Comment: 40oz x4 daily  . Drug use: No  . Sexual activity: Not on file  Lifestyle  . Physical activity:    Days per week: Not on file    Minutes per session: Not on file  . Stress: Not on file  Relationships  . Social connections:    Talks on phone: Not on file    Gets together: Not on file    Attends religious service: Not on file    Active member of club or organization: Not on file    Attends meetings of clubs or organizations: Not on file    Relationship status: Not on file  . Intimate partner violence:    Fear of current or ex partner: Not on  file    Emotionally abused: Not on file    Physically abused: Not on file    Forced sexual activity: Not on file  Other Topics Concern  . Not on file  Social History Narrative  . Not on file     Family History  Problem Relation Age of Onset  . Hypertension Mother      Current Facility-Administered Medications:  .  0.9 %  sodium chloride infusion, 250 mL, Intravenous, PRN, Salary, Montell D, MD, Last Rate: 10 mL/hr at 02/19/18 1350, 250 mL at 02/19/18 1350 .  acetaminophen (TYLENOL) tablet 650 mg, 650 mg, Oral, Q6H PRN **OR** acetaminophen (TYLENOL) suppository 650 mg, 650 mg, Rectal, Q6H PRN, Salary, Montell D, MD .  clonazePAM (KLONOPIN) tablet 1 mg, 1 mg, Oral, TID PRN, Salary, Montell D, MD, 1 mg at  02/20/18 0053 .  DULoxetine (CYMBALTA) DR capsule 30 mg, 30 mg, Oral, Daily, Salary, Montell D, MD, 30 mg at 02/20/18 1209 .  feeding supplement (ENSURE ENLIVE) (ENSURE ENLIVE) liquid 237 mL, 237 mL, Oral, TID WC, Earlie Server, MD .  fluticasone (FLONASE) 50 MCG/ACT nasal spray 1 spray, 1 spray, Each Nare, Daily, Salary, Montell D, MD, 1 spray at 02/20/18 1056 .  folic acid (FOLVITE) tablet 1 mg, 1 mg, Oral, Daily, Salary, Montell D, MD, 1 mg at 02/20/18 1208 .  heparin injection 5,000 Units, 5,000 Units, Subcutaneous, Q8H, Salary, Montell D, MD, 5,000 Units at 02/20/18 1349 .  HYDROcodone-acetaminophen (NORCO/VICODIN) 5-325 MG per tablet 2 tablet, 2 tablet, Oral, Q6H PRN, Salary, Montell D, MD, 2 tablet at 02/19/18 1625 .  ipratropium-albuterol (DUONEB) 0.5-2.5 (3) MG/3ML nebulizer solution 3 mL, 3 mL, Nebulization, Q6H PRN, Pyreddy, Pavan, MD .  levothyroxine (SYNTHROID, LEVOTHROID) tablet 50 mcg, 50 mcg, Oral, QAC breakfast, Salary, Montell D, MD, 50 mcg at 02/20/18 0837 .  LORazepam (ATIVAN) tablet 1 mg, 1 mg, Oral, Q6H PRN **OR** LORazepam (ATIVAN) injection 1 mg, 1 mg, Intravenous, Q6H PRN, Salary, Montell D, MD .  megestrol (MEGACE) tablet 40 mg, 40 mg, Oral, BID, Earlie Server, MD, 40 mg at 02/20/18 1352 .  mometasone-formoterol (DULERA) 100-5 MCG/ACT inhaler 2 puff, 2 puff, Inhalation, BID, Salary, Montell D, MD, 2 puff at 02/20/18 0838 .  morphine 2 MG/ML injection 2 mg, 2 mg, Intravenous, Q2H PRN, Salary, Montell D, MD, 2 mg at 02/20/18 1356 .  multivitamin with minerals tablet 1 tablet, 1 tablet, Oral, Daily, Salary, Montell D, MD, 1 tablet at 02/20/18 1208 .  nicotine (NICODERM CQ - dosed in mg/24 hours) patch 14 mg, 14 mg, Transdermal, Daily, Salary, Montell D, MD, 14 mg at 02/20/18 1053 .  ondansetron (ZOFRAN) tablet 4 mg, 4 mg, Oral, Q6H PRN **OR** ondansetron (ZOFRAN) injection 4 mg, 4 mg, Intravenous, Q6H PRN, Salary, Montell D, MD .  polyethylene glycol (MIRALAX / GLYCOLAX) packet 17 g, 17  g, Oral, Daily PRN, Salary, Montell D, MD .  sodium chloride flush (NS) 0.9 % injection 3 mL, 3 mL, Intravenous, Q12H, Salary, Montell D, MD, 3 mL at 02/20/18 1217 .  sodium chloride flush (NS) 0.9 % injection 3 mL, 3 mL, Intravenous, PRN, Salary, Montell D, MD .  sodium chloride tablet 2 g, 2 g, Oral, TID WC, Salary, Montell D, MD, 2 g at 02/20/18 1211 .  thiamine (VITAMIN B-1) tablet 100 mg, 100 mg, Oral, Daily, 100 mg at 02/20/18 1208 **OR** thiamine (B-1) injection 100 mg, 100 mg, Intravenous, Daily, Salary, Avel Peace, MD   Physical exam:  Vitals:  02/19/18 1243 02/19/18 1911 02/19/18 1915 02/19/18 1932  BP: (!) 155/95 117/84    Pulse: 84 99 93   Resp: 18 16    Temp: (!) 97.4 F (36.3 C) 98.2 F (36.8 C)    TempSrc:  Oral    SpO2:   97% 98%  Weight: 107 lb (48.5 kg)     Height: 5\' 7"  (1.702 m)      Physical Exam  Constitutional:  Frail, chronic ill appearance.  HENT:  Head: Normocephalic.  Status post partial rhinectomy with skin graft.  Eyes: Pupils are equal, round, and reactive to light. EOM are normal.  Neck:  Left neck skin hardening/fixed mass.  Cardiovascular: Normal rate and regular rhythm.  Pulmonary/Chest: Effort normal. No respiratory distress.  Decreased breath sounds bilaterally.  Abdominal: Soft. Bowel sounds are normal. He exhibits no distension.  Musculoskeletal: Normal range of motion.  Neurological: He is alert.  Chronic hearing deficiency, gait not checked  Skin: Skin is warm and dry. He is not diaphoretic. No erythema.      CMP Latest Ref Rng & Units 02/20/2018  Glucose 70 - 99 mg/dL 133(H)  BUN 8 - 23 mg/dL 18  Creatinine 0.61 - 1.24 mg/dL 1.10  Sodium 135 - 145 mmol/L 130(L)  Potassium 3.5 - 5.1 mmol/L 3.6  Chloride 98 - 111 mmol/L 98  CO2 22 - 32 mmol/L 26  Calcium 8.9 - 10.3 mg/dL 8.2(L)  Total Protein 6.5 - 8.1 g/dL -  Total Bilirubin 0.3 - 1.2 mg/dL -  Alkaline Phos 38 - 126 U/L -  AST 15 - 41 U/L -  ALT 17 - 63 U/L -   CBC Latest  Ref Rng & Units 02/19/2018  WBC 3.8 - 10.6 K/uL 7.1  Hemoglobin 13.0 - 18.0 g/dL 11.5(L)  Hematocrit 40.0 - 52.0 % 33.0(L)  Platelets 150 - 440 K/uL 297   RADIOGRAPHIC STUDIES: I have personally reviewed the radiological images as listed and agreed with the findings in the report.  Dg Chest 2 View  Result Date: 02/19/2018 CLINICAL DATA:  67 year old male with shortness of breath and COPD. EXAM: CHEST - 2 VIEW COMPARISON:  Chest radiograph dated 02/09/2016 FINDINGS: There is emphysematous changes of the lungs with hyperinflation. Diffuse interstitial coarsening. No focal consolidation, pleural effusion, or pneumothorax. The cardiac silhouette is within normal limits. Atherosclerotic calcification of the aortic arch. No acute osseous pathology. Multiple small radiopaque foci over the left chest wall and left upper extremity soft tissues. Clinical correlation is recommended. IMPRESSION: 1. No acute cardiopulmonary process. 2. Emphysema. Electronically Signed   By: Anner Crete M.D.   On: 02/19/2018 06:13   Ct Soft Tissue Neck W Contrast  Result Date: 02/19/2018 CLINICAL DATA:  67 y/o M; history of tongue cancer post radiation. KNot on the left-sided neck. Squamous cell carcinoma of the nose. EXAM: CT NECK WITH CONTRAST TECHNIQUE: Multidetector CT imaging of the neck was performed using the standard protocol following the bolus administration of intravenous contrast. CONTRAST:  18mL OMNIPAQUE IOHEXOL 300 MG/ML  SOLN COMPARISON:  05/21/2013 PET-CT. FINDINGS: Pharynx and larynx: Diffuse smooth mucosal thickening of the oral and hypopharynx compatible with posttreatment changes. No exophytic enhancing nodular component. Salivary glands: Absent right submandibular gland. Additional salivary glands are unremarkable. Thyroid: Normal. Lymph nodes: Status post right radical neck dissection. No nodular enhancing disease within the right cervical chain to suggest recurrence. Necrotic lymphadenopathy in the left  level 2/3 station measuring 2.6 x 2.5 x 3.8 cm (AP x ML x CC series  2, image 51 and series 7, image 60). No additional cervical lymphadenopathy identified. Vascular: Dense calcified plaque of the carotid bifurcations bilaterally with mild to moderate 50% left proximal ICA stenosis. Mild less than 50% proximal right ICA stenosis. The right vertebral artery neck is diffusely small in caliber with thickened wall and multiple areas of stenosis likely representing radiation changes. Limited intracranial: Negative. Visualized orbits: Negative. Mastoids and visualized paranasal sinuses: Moderate mucosal thickening of the maxillary sinuses bilaterally with chronic inflammatory changes of the sinus walls. Left mastoid tip effusion. Normal aeration of the right mastoid air cells. Skeleton: Moderate cervical spondylosis. No acute osseous abnormality identified. No high-grade bony canal stenosis. Upper chest: Mild centrilobular emphysema of the upper lobes. Clustered ground-glass opacities and mucous plugging in the left upper lobe, likely bronchopneumonia. Other: None. IMPRESSION: 1. Necrotic lymphadenopathy measuring up to 3.8 cm at the left level 2/3 cervical node station, likely metastatic. No additional lymphadenopathy identified. 2. Posttreatment changes of the oral and hypopharyngeal mucosa and postsurgical changes related to right cervical node dissection. 3. Moderate mucosal thickening of the maxillary sinuses and left mastoid tip effusion. 4. Calcific atherosclerosis of carotid bifurcations with mild to moderate 50% left proximal ICA stenosis. 5. Left upper lobe ground-glass opacities in bronchovascular distribution, likely bronchopneumonia. Electronically Signed   By: Kristine Garbe M.D.   On: 02/19/2018 04:36   US Renal  Result Date: 02/19/2018 CLINICAL DATA:  Chronic renal disease EXAM: RENAL / URINARY TRACT ULTRASOUND COMPLETE COMPARISON:  None. FINDINGS: Right Kidney: Length: 10.2 cm. Echogenicity  and renal cortical thickness are within normal limits. No mass, perinephric fluid, or hydronephrosis visualized. No sonographically demonstrable calculus or ureterectasis. Left Kidney: Length: 10.0 cm. Echogenicity and renal cortical thickness are within normal limits. No mass, perinephric fluid, or hydronephrosis visualized. There are nonobstructing calculi in the left kidney, largest measuring 9 mm. No ureterectasis. Bladder: Appears normal for degree of bladder distention. IMPRESSION: Nonobstructing calculi in left kidney. Study otherwise unremarkable. Electronically Signed   By: Lowella Grip III M.D.   On: 02/19/2018 12:00    Assessment and plan-  Patient is a 67 y.o. 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  # Cervical lymphadenopathy, likely  Recurrence/metastasis.   plan CT-guided biopsy for neck mass.   #Weight loss/ malnutrition Add Megace 40 mg twice daily for appetite stimulants. Discussed with speech pathologist at bedside.  She recommend Puree.  Add Ensure TID with meal.  Aspiration precaution.   # Hyponatremia, acute on chronic. Multifactorial from chronic activism, and dehydration/poor p.o. intake.  Sodium level has improved today.  Nephrology on board continue p.o. sodium chloride.Marland Kitchen   #Hypothyroidism continue Synthroid 50 MCG daily.  Total face to face encounter time for this patient visit was 21min. >50% of the time was  spent in counseling and coordination of care.  Earlie Server, MD, PhD Hematology Oncology [facility] at [facility] Pager- [phone] 02/20/2018

## 2018-02-20 NOTE — Progress Notes (Signed)
[facility] [place] NAME: Thomas [name]    MR#:  [id number]  DATE OF BIRTH:  [birth year]  SUBJECTIVE:  CHIEF COMPLAINT:   Chief Complaint  Patient presents with  . Neck Pain  Patient seen today Had choked on applesauce No complaints of any chest pain No shortness of breath  REVIEW OF SYSTEMS:    ROS  CONSTITUTIONAL: No documented fever. No fatigue, weakness. No weight gain, has weight loss.  EYES: No blurry or double vision.  ENT: No tinnitus. No postnasal drip. No redness of the oropharynx.  RESPIRATORY: No cough, no wheeze, no hemoptysis. No dyspnea.  CARDIOVASCULAR: No chest pain. No orthopnea. No palpitations. No syncope.  GASTROINTESTINAL: No nausea, no vomiting or diarrhea. No abdominal pain. No melena or hematochezia.  GENITOURINARY: No dysuria or hematuria.  ENDOCRINE: No polyuria or nocturia. No heat or cold intolerance.  HEMATOLOGY: No anemia. No bruising. No bleeding.  INTEGUMENTARY: No rashes. No lesions.  MUSCULOSKELETAL: No arthritis. No swelling. No gout.  NEUROLOGIC: No numbness, tingling, or ataxia. No seizure-type activity.  PSYCHIATRIC: No anxiety. No insomnia. No ADD.   DRUG ALLERGIES:  No Known Allergies  VITALS:  Blood pressure 117/84, pulse 93, temperature 98.2 F (36.8 C), temperature source Oral, resp. rate 16, height 5\' 7"  (1.702 m), weight 48.5 kg (107 lb), SpO2 98 %.  PHYSICAL EXAMINATION:   Physical Exam  GENERAL:  67 y.o.-year-old patient lying in the bed with no acute distress.  EYES: Pupils equal, round, reactive to light and accommodation. No scleral icterus. Extraocular muscles intact.  HEENT: Head atraumatic, normocephalic. Oropharynx and nasopharynx clear.  Cervical lymphadenopathy noted NECK:  Supple, no jugular venous distention. No thyroid enlargement, no tenderness.  LUNGS: Normal breath sounds bilaterally, no wheezing, rales, rhonchi. No use of accessory muscles of respiration.   CARDIOVASCULAR: S1, S2 normal. No murmurs, rubs, or gallops.  ABDOMEN: Soft, nontender, nondistended. Bowel sounds present. No organomegaly or mass.  EXTREMITIES: No cyanosis, clubbing or edema b/l.    NEUROLOGIC: Cranial nerves II through XII are intact. No focal Motor or sensory deficits b/l.   PSYCHIATRIC: The patient is alert and oriented x 3.  SKIN: No obvious rash, lesion, or ulcer.   LABORATORY PANEL:   CBC Recent Labs  Lab 02/19/18 1308  WBC 7.1  HGB 11.5*  HCT 33.0*  PLT 297   ------------------------------------------------------------------------------------------------------------------ Chemistries  Recent Labs  Lab 02/20/18 0437  NA 130*  K 3.6  CL 98  CO2 26  GLUCOSE 133*  BUN 18  CREATININE 1.10  CALCIUM 8.2*   ------------------------------------------------------------------------------------------------------------------  Cardiac Enzymes No results for input(s): TROPONINI in the last 168 hours. ------------------------------------------------------------------------------------------------------------------  RADIOLOGY:  Dg Chest 2 View  Result Date: 02/19/2018 CLINICAL DATA:  67 year old male with shortness of breath and COPD. EXAM: CHEST - 2 VIEW COMPARISON:  Chest radiograph dated 02/09/2016 FINDINGS: There is emphysematous changes of the lungs with hyperinflation. Diffuse interstitial coarsening. No focal consolidation, pleural effusion, or pneumothorax. The cardiac silhouette is within normal limits. Atherosclerotic calcification of the aortic arch. No acute osseous pathology. Multiple small radiopaque foci over the left chest wall and left upper extremity soft tissues. Clinical correlation is recommended. IMPRESSION: 1. No acute cardiopulmonary process. 2. Emphysema. Electronically Signed   By: Anner Crete M.D.   On: 02/19/2018 06:13   Ct Soft Tissue Neck W Contrast  Result Date: 02/19/2018 CLINICAL DATA:  67 y/o M; history of tongue cancer  post radiation. KNot on the left-sided neck.  Squamous cell carcinoma of the nose. EXAM: CT NECK WITH CONTRAST TECHNIQUE: Multidetector CT imaging of the neck was performed using the standard protocol following the bolus administration of intravenous contrast. CONTRAST:  68mL OMNIPAQUE IOHEXOL 300 MG/ML  SOLN COMPARISON:  05/21/2013 PET-CT. FINDINGS: Pharynx and larynx: Diffuse smooth mucosal thickening of the oral and hypopharynx compatible with posttreatment changes. No exophytic enhancing nodular component. Salivary glands: Absent right submandibular gland. Additional salivary glands are unremarkable. Thyroid: Normal. Lymph nodes: Status post right radical neck dissection. No nodular enhancing disease within the right cervical chain to suggest recurrence. Necrotic lymphadenopathy in the left level 2/3 station measuring 2.6 x 2.5 x 3.8 cm (AP x ML x CC series 2, image 51 and series 7, image 60). No additional cervical lymphadenopathy identified. Vascular: Dense calcified plaque of the carotid bifurcations bilaterally with mild to moderate 50% left proximal ICA stenosis. Mild less than 50% proximal right ICA stenosis. The right vertebral artery neck is diffusely small in caliber with thickened wall and multiple areas of stenosis likely representing radiation changes. Limited intracranial: Negative. Visualized orbits: Negative. Mastoids and visualized paranasal sinuses: Moderate mucosal thickening of the maxillary sinuses bilaterally with chronic inflammatory changes of the sinus walls. Left mastoid tip effusion. Normal aeration of the right mastoid air cells. Skeleton: Moderate cervical spondylosis. No acute osseous abnormality identified. No high-grade bony canal stenosis. Upper chest: Mild centrilobular emphysema of the upper lobes. Clustered ground-glass opacities and mucous plugging in the left upper lobe, likely bronchopneumonia. Other: None. IMPRESSION: 1. Necrotic lymphadenopathy measuring up to 3.8 cm at the  left level 2/3 cervical node station, likely metastatic. No additional lymphadenopathy identified. 2. Posttreatment changes of the oral and hypopharyngeal mucosa and postsurgical changes related to right cervical node dissection. 3. Moderate mucosal thickening of the maxillary sinuses and left mastoid tip effusion. 4. Calcific atherosclerosis of carotid bifurcations with mild to moderate 50% left proximal ICA stenosis. 5. Left upper lobe ground-glass opacities in bronchovascular distribution, likely bronchopneumonia. Electronically Signed   By: Kristine Garbe M.D.   On: 02/19/2018 04:36   US Renal  Result Date: 02/19/2018 CLINICAL DATA:  Chronic renal disease EXAM: RENAL / URINARY TRACT ULTRASOUND COMPLETE COMPARISON:  None. FINDINGS: Right Kidney: Length: 10.2 cm. Echogenicity and renal cortical thickness are within normal limits. No mass, perinephric fluid, or hydronephrosis visualized. No sonographically demonstrable calculus or ureterectasis. Left Kidney: Length: 10.0 cm. Echogenicity and renal cortical thickness are within normal limits. No mass, perinephric fluid, or hydronephrosis visualized. There are nonobstructing calculi in the left kidney, largest measuring 9 mm. No ureterectasis. Bladder: Appears normal for degree of bladder distention. IMPRESSION: Nonobstructing calculi in left kidney. Study otherwise unremarkable. Electronically Signed   By: Lowella Grip III M.D.   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 Management plans discussed with the patient, family and they are in agreement.  CODE STATUS: Full code  DVT Prophylaxis: SCDs  TOTAL TIME TAKING CARE OF THIS PATIENT: 55  minutes.   POSSIBLE D/C IN 2 to 3 DAYS, DEPENDING ON CLINICAL CONDITION.  Saundra Shelling M.D on 02/20/2018 at 3:55 PM  Between 7am to 6pm - Pager - [phone]  After 6pm go to [email or web address] - password [email or web address] [facility]  Office  [phone]  CC: Primary care physician; [facility]  Note: This dictation was prepared with Dragon dictation along with smaller phrase technology. Any transcriptional errors that result from this process are unintentional.

## 2018-02-21 ENCOUNTER — Inpatient Hospital Stay: Payer: Medicare Other

## 2018-02-21 DIAGNOSIS — F17210 Nicotine dependence, cigarettes, uncomplicated: Secondary | ICD-10-CM

## 2018-02-21 LAB — PROTIME-INR
INR: 1.12
Prothrombin Time: 14.3 seconds (ref 11.4–15.2)

## 2018-02-21 LAB — BASIC METABOLIC PANEL
Anion gap: 5 (ref 5–15)
BUN: 18 mg/dL (ref 8–23)
CO2: 29 mmol/L (ref 22–32)
Calcium: 8.7 mg/dL — ABNORMAL LOW (ref 8.9–10.3)
Chloride: 102 mmol/L (ref 98–111)
Creatinine, Ser: 1.2 mg/dL (ref 0.61–1.24)
GFR calc Af Amer: >60 mL/min (ref >60)
GLUCOSE: 111 mg/dL — AB (ref 70–99)
POTASSIUM: 3.7 mmol/L (ref 3.5–5.1)
Sodium: 136 mmol/L (ref 135–145)

## 2018-02-21 LAB — MAGNESIUM: Magnesium: 2 mg/dL (ref 1.7–2.4)

## 2018-02-21 LAB — LEGIONELLA PNEUMOPHILA SEROGP 1 UR AG: L. pneumophila Serogp 1 Ur Ag: NEGATIVE

## 2018-02-21 LAB — PHOSPHORUS: Phosphorus: 3.1 mg/dL (ref 2.5–4.6)

## 2018-02-21 LAB — APTT: APTT: 42 s — AB (ref 24–36)

## 2018-02-21 MED ORDER — TRAZODONE HCL 50 MG PO TABS
50.0000 mg | ORAL_TABLET | Freq: Every evening | ORAL | Status: DC | PRN
  Administered 2018-02-21 – 2018-03-07 (×5): 50 mg via ORAL
  Filled 2018-02-21 (×6): qty 1

## 2018-02-21 MED ORDER — SODIUM CHLORIDE 0.9 % IV SOLN
INTRAVENOUS | Status: DC
  Administered 2018-02-21 – 2018-02-26 (×6): via INTRAVENOUS

## 2018-02-21 MED ORDER — FENTANYL CITRATE (PF) 100 MCG/2ML IJ SOLN
INTRAMUSCULAR | Status: AC
  Filled 2018-02-21: qty 4

## 2018-02-21 MED ORDER — MIDAZOLAM HCL 5 MG/5ML IJ SOLN
INTRAMUSCULAR | Status: AC
  Filled 2018-02-21: qty 5

## 2018-02-21 NOTE — Procedures (Signed)
US guided FNA and core biopsies of left cervical lymph node/mass.  Minimal blood loss and no immediate complication.

## 2018-02-21 NOTE — Progress Notes (Signed)
Hematology/Oncology Progress Note [facility] [phone] [phone]  Patient Care Team: [facility] as PCP - General   Name of the patient: Thomas [name]  [id number]  Jan 25, 1951  Date of visit: 02/21/18  INTERVAL HISTORY-  Patient was seen at the bedside.   No acute overnight event.  Status post neck mass biopsy.  Review of systems- Review of Systems  Constitutional: Positive for malaise/fatigue. Negative for chills and fever.  HENT: Positive for hearing loss.        Swallowing difficulty  Eyes: Negative for pain.  Respiratory: Negative for shortness of breath.   Cardiovascular: Negative for chest pain.  Gastrointestinal: Negative for nausea and vomiting.  Genitourinary: Negative for hematuria.  Musculoskeletal: Negative for myalgias.  Skin: Negative for rash.  Neurological: Positive for weakness.  Endo/Heme/Allergies: Does not bruise/bleed easily.  Psychiatric/Behavioral: Negative for depression.    No Known Allergies  Patient Active Problem List   Diagnosis Date Noted  . Neck mass   . Cancer ([place]) 02/19/2018  . Cervical lymphadenopathy   . Poor appetite   . Loss of weight   . History of tongue cancer   . Protein-calorie malnutrition, severe 01/22/2016  . Alcohol use disorder, severe, dependence ([place]) 12/12/2014  . Hyponatremia 12/12/2014  . Essential hypertension 12/12/2014  . COPD (chronic obstructive pulmonary disease) ([place]) 12/12/2014     Past Medical History:  Diagnosis Date  . Cancer of nasal cavities ([place])   . Enlarged prostate   . Hypertension   . Lung infection    no lung cancer  . Radiation    to neck  . Renal disorder   . Skin cancer   . Status post chemotherapy   . Tongue cancer [facility])      Past Surgical History:  Procedure Laterality Date  . cancerous lymph nodes removed from neck    . KNEE SURGERY Right   . SKIN BIOPSY      Social History   Socioeconomic History  . Marital  status: Divorced    Spouse name: Not on file  . Number of children: Not on file  . Years of education: Not on file  . Highest education level: Not on file  Occupational History  . Not on file  Social Needs  . Financial resource strain: Not on file  . Food insecurity:    Worry: Not on file    Inability: Not on file  . Transportation needs:    Medical: Not on file    Non-medical: Not on file  Tobacco Use  . Smoking status: Heavy Tobacco Smoker    Types: Cigarettes  . Smokeless tobacco: Never Used  Substance and Sexual Activity  . Alcohol use: Yes    Comment: 40oz x4 daily  . Drug use: No  . Sexual activity: Not on file  Lifestyle  . Physical activity:    Days per week: Not on file    Minutes per session: Not on file  . Stress: Not on file  Relationships  . Social connections:    Talks on phone: Not on file    Gets together: Not on file    Attends religious service: Not on file    Active member of club or organization: Not on file    Attends meetings of clubs or organizations: Not on file    Relationship status: Not on file  . Intimate partner violence:    Fear of current or ex partner: Not on file  Emotionally abused: Not on file    Physically abused: Not on file    Forced sexual activity: Not on file  Other Topics Concern  . Not on file  Social History Narrative  . Not on file     Family History  Problem Relation Age of Onset  . Hypertension Mother      Current Facility-Administered Medications:  .  0.9 %  sodium chloride infusion, , Intravenous, Continuous, Pyreddy, Pavan, MD, Last Rate: 50 mL/hr at 02/21/18 1035 .  acetaminophen (TYLENOL) tablet 650 mg, 650 mg, Oral, Q6H PRN **OR** acetaminophen (TYLENOL) suppository 650 mg, 650 mg, Rectal, Q6H PRN, Salary, Montell D, MD .  clonazePAM (KLONOPIN) tablet 1 mg, 1 mg, Oral, TID PRN, Salary, Montell D, MD, 1 mg at 02/20/18 0053 .  DULoxetine (CYMBALTA) DR capsule 30 mg, 30 mg, Oral, Daily, Salary, Montell D,  MD, 30 mg at 02/21/18 1124 .  feeding supplement (ENSURE ENLIVE) (ENSURE ENLIVE) liquid 237 mL, 237 mL, Oral, TID WC, Earlie Server, MD, 237 mL at 02/20/18 1628 .  fluticasone (FLONASE) 50 MCG/ACT nasal spray 1 spray, 1 spray, Each Nare, Daily, Salary, Montell D, MD, 1 spray at 02/21/18 1124 .  folic acid (FOLVITE) tablet 1 mg, 1 mg, Oral, Daily, Salary, Montell D, MD, 1 mg at 02/21/18 1124 .  heparin injection 5,000 Units, 5,000 Units, Subcutaneous, Q8H, Salary, Montell D, MD, 5,000 Units at 02/20/18 2124 .  HYDROcodone-acetaminophen (NORCO/VICODIN) 5-325 MG per tablet 2 tablet, 2 tablet, Oral, Q6H PRN, Salary, Montell D, MD, 2 tablet at 02/21/18 0001 .  ipratropium-albuterol (DUONEB) 0.5-2.5 (3) MG/3ML nebulizer solution 3 mL, 3 mL, Nebulization, Q6H PRN, Pyreddy, Pavan, MD .  levothyroxine (SYNTHROID, LEVOTHROID) tablet 50 mcg, 50 mcg, Oral, QAC breakfast, Salary, Montell D, MD, 50 mcg at 02/21/18 1124 .  LORazepam (ATIVAN) tablet 1 mg, 1 mg, Oral, Q6H PRN **OR** LORazepam (ATIVAN) injection 1 mg, 1 mg, Intravenous, Q6H PRN, Salary, Montell D, MD .  megestrol (MEGACE) tablet 40 mg, 40 mg, Oral, BID, Earlie Server, MD, 40 mg at 02/20/18 2125 .  mometasone-formoterol (DULERA) 100-5 MCG/ACT inhaler 2 puff, 2 puff, Inhalation, BID, Salary, Montell D, MD, 2 puff at 02/21/18 0754 .  morphine 2 MG/ML injection 2 mg, 2 mg, Intravenous, Q2H PRN, Salary, Montell D, MD, 2 mg at 02/21/18 0430 .  multivitamin with minerals tablet 1 tablet, 1 tablet, Oral, Daily, Salary, Montell D, MD, 1 tablet at 02/21/18 1124 .  nicotine (NICODERM CQ - dosed in mg/24 hours) patch 14 mg, 14 mg, Transdermal, Daily, Salary, Montell D, MD, 14 mg at 02/21/18 1124 .  ondansetron (ZOFRAN) tablet 4 mg, 4 mg, Oral, Q6H PRN **OR** ondansetron (ZOFRAN) injection 4 mg, 4 mg, Intravenous, Q6H PRN, Salary, Montell D, MD .  polyethylene glycol (MIRALAX / GLYCOLAX) packet 17 g, 17 g, Oral, Daily PRN, Salary, Montell D, MD .  sodium chloride tablet 2  g, 2 g, Oral, TID WC, Salary, Montell D, MD, 2 g at 02/21/18 1124 .  thiamine (VITAMIN B-1) tablet 100 mg, 100 mg, Oral, Daily, 100 mg at 02/21/18 1124 **OR** thiamine (B-1) injection 100 mg, 100 mg, Intravenous, Daily, Loney Hering D, MD   Physical exam:  Vitals:   02/21/18 0926 02/21/18 1014 02/21/18 1018 02/21/18 1300  BP: 102/82 (!) 74/37 (!) 85/59 (!) 90/59  Pulse: 63 100 (!) 117 (!) 124  Resp:  12  18  Temp:  (!) 97.4 F (36.3 C)    TempSrc:  Oral  SpO2: 94% (!) 87%  100%  Weight:      Height:       Physical Exam  Constitutional: He is well-developed, well-nourished, and in no distress. No distress.  Frail appearance  HENT:  Head: Normocephalic and atraumatic.  Nose: Nose normal.  Mouth/Throat: Oropharynx is clear and moist. No oropharyngeal exudate.  History of partial rhinectomy with skin graft  Eyes: Pupils are equal, round, and reactive to light. EOM are normal. Left eye exhibits no discharge. No scleral icterus.  Neck: Normal range of motion.  Left neck skin hardening/fixed mass.  Cardiovascular: Normal rate and regular rhythm.  No murmur heard. Pulmonary/Chest: Effort normal. No respiratory distress. He has no rales.  Abdominal: Soft. He exhibits no distension. There is no tenderness.  Musculoskeletal: Normal range of motion. He exhibits no edema.  Neurological: He is alert. No cranial nerve deficit.  Skin: Skin is warm and dry. He is not diaphoretic.  Neck hyperpigmentation /chronic radiation changes.  Psychiatric: Mood normal.      CMP Latest Ref Rng & Units 02/21/2018  Glucose 70 - 99 mg/dL 111(H)  BUN 8 - 23 mg/dL 18  Creatinine 0.61 - 1.24 mg/dL 1.20  Sodium 135 - 145 mmol/L 136  Potassium 3.5 - 5.1 mmol/L 3.7  Chloride 98 - 111 mmol/L 102  CO2 22 - 32 mmol/L 29  Calcium 8.9 - 10.3 mg/dL 8.7(L)  Total Protein 6.5 - 8.1 g/dL -  Total Bilirubin 0.3 - 1.2 mg/dL -  Alkaline Phos 38 - 126 U/L -  AST 15 - 41 U/L -  ALT 17 - 63 U/L -   CBC Latest  Ref Rng & Units 02/19/2018  WBC 3.8 - 10.6 K/uL 7.1  Hemoglobin 13.0 - 18.0 g/dL 11.5(L)  Hematocrit 40.0 - 52.0 % 33.0(L)  Platelets 150 - 440 K/uL 297   RADIOGRAPHIC STUDIES: I have personally reviewed the radiological images as listed and agreed with the findings in the report.  Dg Chest 2 View  Result Date: 02/19/2018 CLINICAL DATA:  67 year old male with shortness of breath and COPD. EXAM: CHEST - 2 VIEW COMPARISON:  Chest radiograph dated 02/09/2016 FINDINGS: There is emphysematous changes of the lungs with hyperinflation. Diffuse interstitial coarsening. No focal consolidation, pleural effusion, or pneumothorax. The cardiac silhouette is within normal limits. Atherosclerotic calcification of the aortic arch. No acute osseous pathology. Multiple small radiopaque foci over the left chest wall and left upper extremity soft tissues. Clinical correlation is recommended. IMPRESSION: 1. No acute cardiopulmonary process. 2. Emphysema. Electronically Signed   By: Anner Crete M.D.   On: 02/19/2018 06:13   Ct Soft Tissue Neck W Contrast  Result Date: 02/19/2018 CLINICAL DATA:  67 y/o M; history of tongue cancer post radiation. KNot on the left-sided neck. Squamous cell carcinoma of the nose. EXAM: CT NECK WITH CONTRAST TECHNIQUE: Multidetector CT imaging of the neck was performed using the standard protocol following the bolus administration of intravenous contrast. CONTRAST:  51mL OMNIPAQUE IOHEXOL 300 MG/ML  SOLN COMPARISON:  05/21/2013 PET-CT. FINDINGS: Pharynx and larynx: Diffuse smooth mucosal thickening of the oral and hypopharynx compatible with posttreatment changes. No exophytic enhancing nodular component. Salivary glands: Absent right submandibular gland. Additional salivary glands are unremarkable. Thyroid: Normal. Lymph nodes: Status post right radical neck dissection. No nodular enhancing disease within the right cervical chain to suggest recurrence. Necrotic lymphadenopathy in the left  level 2/3 station measuring 2.6 x 2.5 x 3.8 cm (AP x ML x CC series 2, image 51 and series  7, image 60). No additional cervical lymphadenopathy identified. Vascular: Dense calcified plaque of the carotid bifurcations bilaterally with mild to moderate 50% left proximal ICA stenosis. Mild less than 50% proximal right ICA stenosis. The right vertebral artery neck is diffusely small in caliber with thickened wall and multiple areas of stenosis likely representing radiation changes. Limited intracranial: Negative. Visualized orbits: Negative. Mastoids and visualized paranasal sinuses: Moderate mucosal thickening of the maxillary sinuses bilaterally with chronic inflammatory changes of the sinus walls. Left mastoid tip effusion. Normal aeration of the right mastoid air cells. Skeleton: Moderate cervical spondylosis. No acute osseous abnormality identified. No high-grade bony canal stenosis. Upper chest: Mild centrilobular emphysema of the upper lobes. Clustered ground-glass opacities and mucous plugging in the left upper lobe, likely bronchopneumonia. Other: None. IMPRESSION: 1. Necrotic lymphadenopathy measuring up to 3.8 cm at the left level 2/3 cervical node station, likely metastatic. No additional lymphadenopathy identified. 2. Posttreatment changes of the oral and hypopharyngeal mucosa and postsurgical changes related to right cervical node dissection. 3. Moderate mucosal thickening of the maxillary sinuses and left mastoid tip effusion. 4. Calcific atherosclerosis of carotid bifurcations with mild to moderate 50% left proximal ICA stenosis. 5. Left upper lobe ground-glass opacities in bronchovascular distribution, likely bronchopneumonia. Electronically Signed   By: Kristine Garbe M.D.   On: 02/19/2018 04:36   US Renal  Result Date: 02/19/2018 CLINICAL DATA:  Chronic renal disease EXAM: RENAL / URINARY TRACT ULTRASOUND COMPLETE COMPARISON:  None. FINDINGS: Right Kidney: Length: 10.2 cm. Echogenicity  and renal cortical thickness are within normal limits. No mass, perinephric fluid, or hydronephrosis visualized. No sonographically demonstrable calculus or ureterectasis. Left Kidney: Length: 10.0 cm. Echogenicity and renal cortical thickness are within normal limits. No mass, perinephric fluid, or hydronephrosis visualized. There are nonobstructing calculi in the left kidney, largest measuring 9 mm. No ureterectasis. Bladder: Appears normal for degree of bladder distention. IMPRESSION: Nonobstructing calculi in left kidney. Study otherwise unremarkable. Electronically Signed   By: Lowella Grip III M.D.   On: 02/19/2018 12:00   [place] Core Biopsy (lymph Nodes)  Result Date: 02/21/2018 INDICATION: 67 year old with history of squamous cell carcinoma of the tongue. Status post radiation and surgical treatment. Patient has a left sided neck mass and presents for biopsy. EXAM: ULTRASOUND-GUIDED FINE-NEEDLE ASPIRATION AND CORE BIOPSY OF LEFT CERVICAL MASS/LYMPH NODE MEDICATIONS: None. ANESTHESIA/SEDATION: None FLUOROSCOPY TIME:  None COMPLICATIONS: None immediate. PROCEDURE: Informed written consent was obtained from the patient's daughter after a thorough discussion of the procedural risks, benefits and alternatives. All questions were addressed. A timeout was performed prior to the initiation of the procedure. The area of concern was identified with ultrasound. The left side of neck was prepped with chlorhexidine and a sterile field was created. Skin and soft tissues were anesthetized with 1% lidocaine. Using ultrasound guidance, 6 fine-needle aspirations were obtained with 25 gauge needles. Subsequently, 3 core biopsies were obtained with an 18 gauge core device. No significant bleeding or hematoma formation. Bandage placed over the puncture site. FINDINGS: Poorly defined heterogeneous soft tissue surrounding the left carotid artery at the bifurcation. Fine-needle aspiration and core biopsy needles identified  within the lesion. IMPRESSION: Ultrasound-guided fine-needle aspiration and core biopsies of the left neck mass. Electronically Signed   By: Markus Daft M.D.   On: 02/21/2018 11:08   Dg Hip Unilat With Pelvis 2-3 Views Left  Result Date: 02/21/2018 CLINICAL DATA:  Fall. EXAM: DG HIP (WITH OR WITHOUT PELVIS) 2-3V LEFT COMPARISON:  No prior. FINDINGS: Diffuse left hip degenerative  change. Diffuse osteopenia. No acute bony or joint abnormality. No evidence of fracture dislocation. Peripheral vascular calcification. IMPRESSION: 1. Diffuse degenerative change in osteopenia. No acute abnormality. 2.  Peripheral vascular disease. Electronically Signed   By: Marcello Moores  Register   On: 02/21/2018 14:47   Dg Hip Unilat With Pelvis 2-3 Views Right  Result Date: 02/21/2018 CLINICAL DATA:  Fall from bed EXAM: DG HIP (WITH OR WITHOUT PELVIS) 2-3V RIGHT COMPARISON:  None. FINDINGS: Early degenerative changes with early spurring bilaterally. Joint spaces maintained. SI joints are symmetric and unremarkable. No acute bony abnormality. Specifically, no fracture, subluxation, or dislocation. Vascular calcifications. IMPRESSION: No acute bony abnormality. Electronically Signed   By: Rolm Baptise M.D.   On: 02/21/2018 14:47    Assessment and plan-  Patient is a 67 y.o. 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  # Cervical lymphadenopathy, likely  Recurrence/metastasis.   Status post left neck mass biopsy by IR today.  #Weight loss/ malnutrition Continue Megace 40 mg twice daily for appetite stimulants.  Continue Ensure 3 times daily with meal aspiration precaution.   Discussed with patient's nurse Robyn, patient is being eating pured food  well. Pending feeding tube physician on barium swallow study for further determination of severity of oropharyngeal dysphagia  # Hyponatremia, acute on chronic. Multifactorial from chronic activism, and dehydration/poor p.o. intake.    Nephrology on board continue p.o. sodium chloride.. Sodium level improving.  #Hypothyroidism continue Synthroid 50 MCG daily.  Total face to face encounter time for this patient visit was 25 min. >50% of the time was  spent in counseling and coordination of care.    Earlie Server, MD, PhD Hematology Oncology [facility] at [facility] Pager- [phone] 02/21/2018

## 2018-02-21 NOTE — Progress Notes (Signed)
[facility]  ROUNDING NOTE   Subjective:   Na 136 (130) (124)  Daughter at bedside  Lymph node biopsy earlier today.   Objective:  Vital signs in last 24 hours:  Temp:  [96.2 F (35.7 C)-97.9 F (36.6 C)] 97.4 F (36.3 C) (07/18 1014) Pulse Rate:  [63-129] 124 (07/18 1300) Resp:  [12-18] 18 (07/18 1300) BP: (74-117)/(37-99) 90/59 (07/18 1300) SpO2:  [87 %-100 %] 100 % (07/18 1300) Weight:  [51.3 kg (113 lb 1.5 oz)] 51.3 kg (113 lb 1.5 oz) (07/17 1624)  Weight change: 2.765 kg (6 lb 1.5 oz) Filed Weights   02/18/18 2322 02/19/18 1243 02/20/18 1624  Weight: 61.2 kg (135 lb) 48.5 kg (107 lb) 51.3 kg (113 lb 1.5 oz)    Intake/Output: I/O last 3 completed shifts: In: 100 [I.V.:100] Out: 500 [Urine:500]   Intake/Output this shift:  Total I/O In: 833.4 [P.O.:711; I.V.:122.4] Out: -   Physical Exam: General: NAD, laying in bed, cachectic  Head: Normocephalic, atraumatic. Moist oral mucosal membranes  Eyes: Anicteric, PERRL  Neck: Supple, trachea midline  Lungs:  Clear to auscultation  Heart: Regular rate and rhythm  Abdomen:  Soft, nontender,   Extremities:  no peripheral edema.  Neurologic: Nonfocal, moving all four extremities  Skin: No lesions        Basic Metabolic Panel: Recent Labs  Lab 02/18/18 2327 02/19/18 1308 02/19/18 1707 02/20/18 0437 02/21/18 0433  NA 124*  --  126* 130* 136  K 3.7  --  3.6 3.6 3.7  CL 88*  --  94* 98 102  CO2 24  --  26 26 29   GLUCOSE 240*  --  100* 133* 111*  BUN 33*  --  23 18 18   CREATININE 1.69* 1.20 1.23 1.10 1.20  CALCIUM 9.1  --  8.2* 8.2* 8.7*  MG  --   --   --   --  2.0  PHOS  --   --   --   --  3.1    Liver Function Tests: No results for input(s): AST, ALT, ALKPHOS, BILITOT, PROT, ALBUMIN in the last 168 hours. No results for input(s): LIPASE, AMYLASE in the last 168 hours. No results for input(s): AMMONIA in the last 168 hours.  CBC: Recent Labs  Lab 02/18/18 2327 02/19/18 1308  WBC 7.7  7.1  HGB 12.6* 11.5*  HCT 37.2* 33.0*  MCV 85.4 84.8  PLT 365 297    Cardiac Enzymes: No results for input(s): CKTOTAL, CKMB, CKMBINDEX, TROPONINI in the last 168 hours.  BNP: Invalid input(s): POCBNP  CBG: No results for input(s): GLUCAP in the last 168 hours.  Microbiology: Results for orders placed or performed during the hospital encounter of 02/19/18  Culture, blood (routine x 2) Call MD if unable to obtain prior to antibiotics being given     Status: None (Preliminary result)   Collection Time: 02/19/18  1:08 PM  Result Value Ref Range Status   Specimen Description BLOOD RIGHT ANTECUBITAL  Final   Special Requests   Final    BOTTLES DRAWN AEROBIC AND ANAEROBIC Blood Culture adequate volume   Culture   Final    NO GROWTH 2 DAYS Performed at [facility], [address], [place], [place] [address]    Report Status PENDING  Incomplete  Culture, blood (routine x 2) Call MD if unable to obtain prior to antibiotics being given     Status: None (Preliminary result)   Collection Time: 02/19/18  1:19 PM  Result Value Ref Range Status   Specimen Description BLOOD LEFT ANTECUBITAL  Final   Special Requests   Final    BOTTLES DRAWN AEROBIC AND ANAEROBIC Blood Culture adequate volume   Culture   Final    NO GROWTH 2 DAYS Performed at [facility], [address] [place], [place] [address]    Report Status PENDING  Incomplete    Coagulation Studies: Recent Labs    02/21/18 0433  LABPROT 14.3  INR 1.12    Urinalysis: No results for input(s): COLORURINE, LABSPEC, PHURINE, GLUCOSEU, HGBUR, BILIRUBINUR, KETONESUR, PROTEINUR, UROBILINOGEN, NITRITE, LEUKOCYTESUR in the last 72 hours.  Invalid input(s): APPERANCEUR    Imaging: [place] Core Biopsy (lymph Nodes)  Result Date: 02/21/2018 INDICATION: 67 year old with history of squamous cell carcinoma of the tongue. Status post radiation and surgical treatment. Patient has a left sided neck mass and presents  for biopsy. EXAM: ULTRASOUND-GUIDED FINE-NEEDLE ASPIRATION AND CORE BIOPSY OF LEFT CERVICAL MASS/LYMPH NODE MEDICATIONS: None. ANESTHESIA/SEDATION: None FLUOROSCOPY TIME:  None COMPLICATIONS: None immediate. PROCEDURE: Informed written consent was obtained from the patient's daughter after a thorough discussion of the procedural risks, benefits and alternatives. All questions were addressed. A timeout was performed prior to the initiation of the procedure. The area of concern was identified with ultrasound. The left side of neck was prepped with chlorhexidine and a sterile field was created. Skin and soft tissues were anesthetized with 1% lidocaine. Using ultrasound guidance, 6 fine-needle aspirations were obtained with 25 gauge needles. Subsequently, 3 core biopsies were obtained with an 18 gauge core device. No significant bleeding or hematoma formation. Bandage placed over the puncture site. FINDINGS: Poorly defined heterogeneous soft tissue surrounding the left carotid artery at the bifurcation. Fine-needle aspiration and core biopsy needles identified within the lesion. IMPRESSION: Ultrasound-guided fine-needle aspiration and core biopsies of the left neck mass. Electronically Signed   By: Markus Daft M.D.   On: 02/21/2018 11:08   Dg Hip Unilat With Pelvis 2-3 Views Left  Result Date: 02/21/2018 CLINICAL DATA:  Fall. EXAM: DG HIP (WITH OR WITHOUT PELVIS) 2-3V LEFT COMPARISON:  No prior. FINDINGS: Diffuse left hip degenerative change. Diffuse osteopenia. No acute bony or joint abnormality. No evidence of fracture dislocation. Peripheral vascular calcification. IMPRESSION: 1. Diffuse degenerative change in osteopenia. No acute abnormality. 2.  Peripheral vascular disease. Electronically Signed   By: Marcello Moores  Register   On: 02/21/2018 14:47   Dg Hip Unilat With Pelvis 2-3 Views Right  Result Date: 02/21/2018 CLINICAL DATA:  Fall from bed EXAM: DG HIP (WITH OR WITHOUT PELVIS) 2-3V RIGHT COMPARISON:  None.  FINDINGS: Early degenerative changes with early spurring bilaterally. Joint spaces maintained. SI joints are symmetric and unremarkable. No acute bony abnormality. Specifically, no fracture, subluxation, or dislocation. Vascular calcifications. IMPRESSION: No acute bony abnormality. Electronically Signed   By: Rolm Baptise M.D.   On: 02/21/2018 14:47     Medications:   . sodium chloride 50 mL/hr at 02/21/18 1035   . DULoxetine  30 mg Oral Daily  . feeding supplement (ENSURE ENLIVE)  237 mL Oral TID WC  . fluticasone  1 spray Each Nare Daily  . folic acid  1 mg Oral Daily  . heparin  5,000 Units Subcutaneous Q8H  . levothyroxine  50 mcg Oral QAC breakfast  . megestrol  40 mg Oral BID  . mometasone-formoterol  2 puff Inhalation BID  . multivitamin with minerals  1 tablet Oral Daily  . nicotine  14 mg Transdermal Daily  .  sodium chloride  2 g Oral TID WC  . thiamine  100 mg Oral Daily   Or  . thiamine  100 mg Intravenous Daily   acetaminophen **OR** acetaminophen, clonazePAM, HYDROcodone-acetaminophen, ipratropium-albuterol, LORazepam **OR** LORazepam, morphine injection, ondansetron **OR** ondansetron (ZOFRAN) IV, polyethylene glycol  Assessment/ Plan:  Mr. JAESHAUN RIVA is a 67 y.o. white male with history of head and neck cancer 2014, alcohol abuse, COPD, tobacco use, hypertension, seizure disorder who is admitted to [facility] on 02/19/2018 for Hyponatremia [E87.1] Cervical lymphadenopathy [R59.0] CKD (chronic kidney disease) [N18.9]  1. Hyponatremia: exam is consistent with hypovolemia. 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 Chronic Kidney Disease stage III: Creatinine on admission of 1.69, trended down   - Discussed CKD with patient and family.   3. Hypertension: elevated in the ED. Currently not on any blood pressure medications.   4. Hypothyroidism: TSH elevated.  - restarted on levothyroxine   LOS:  1 Nga Rabon 7/18/20193:51 PM

## 2018-02-21 NOTE — Plan of Care (Signed)
  Problem: Education: Goal: Knowledge of General Education information will improve Outcome: Progressing   Problem: Health Behavior/Discharge Planning: Goal: Ability to manage health-related needs will improve Outcome: Progressing   Problem: Clinical Measurements: Goal: Ability to maintain clinical measurements within normal limits will improve Outcome: Progressing Goal: Will remain free from infection Outcome: Progressing Goal: Diagnostic test results will improve Outcome: Progressing Goal: Respiratory complications will improve Outcome: Progressing Goal: Cardiovascular complication will be avoided Outcome: Progressing   Problem: Activity: Goal: Risk for activity intolerance will decrease Outcome: Progressing   Problem: Nutrition: Goal: Adequate nutrition will be maintained Outcome: Progressing   Problem: Coping: Goal: Level of anxiety will decrease Outcome: Progressing   Problem: Elimination: Goal: Will not experience complications related to bowel motility Outcome: Progressing Goal: Will not experience complications related to urinary retention Outcome: Progressing   Problem: Pain Managment: Goal: General experience of comfort will improve Outcome: Progressing   Problem: Safety: Goal: Ability to remain free from injury will improve Outcome: Progressing   Problem: Skin Integrity: Goal: Risk for impaired skin integrity will decrease Outcome: Progressing   Problem: Education: Goal: Knowledge of General Education information will improve Outcome: Progressing   Problem: Health Behavior/Discharge Planning: Goal: Ability to manage health-related needs will improve Outcome: Progressing   Problem: Nutrition: Goal: Adequate nutrition will be maintained Outcome: Progressing

## 2018-02-21 NOTE — Progress Notes (Signed)
Speech Language Pathology Treatment: Dysphagia  Patient Details Name: Thomas [name] MRN: [id number] DOB: 1951-05-19 Today's Date: 02/21/2018 Time: 6270-3500 SLP Time Calculation (min) (ACUTE ONLY): 40 min  Assessment / Plan / Recommendation Clinical Impression  Pt seen for ongoing assessment of toleration of oral diet; education w/ pt and Dtr on pt's presentation of oropharyngeal phase dysphagia w/ possible need for MBSS. Pt recently returned from biopsies of Left cervical lymph node/mass and is using an ice pack on his Left neck/shoulder area. Pt did not immediately indicate desire for breakfast; Dtr ordered him Oatmeal w/ jelly. Pt presented w/ min drowsiness but was awake and talking w/ Dtr and SLP.  Pt consumed po trials of thin liquids via cup and straw w/ delayed, overt s/s of aspiration noted ~4/7 trials w/ thin liquids. He appeared to perform better w/ sips via cup, however, a delayed cough was noted toward end of trials post swallow by cup. Pt appeared to tolerate trials of the Puree w/ no immediate or delayed s/s of aspiration. During trials of Puree, oral phase was min slower during bolus management and A-P transfer for swallowing. Pt's cough was congested sounding, somewhat nonproductive. Pt's speech is mumbled, Dysarthric. He was able to hold his own cup for drinking but needed encouragement to take po trials in general.  Pt appears to present w/ oropharyngeal phase dysphagia w/ increased risk for aspiration of oral intake. Suspect much of pt's dysphagia is related to his decreased lingual strength/control/coordination during bolus management stemming from his past R base of tongue Ca w/ neck dissection; XRT. Time was spent talking to Dtr about this and the impact Dysphagia can have on ability to safely meet nutritional needs adequately. Dtr stated she has told her Father he would get "another PEG tube like last time". MD has stated awareness of pt's potential need for alternative means of  feeding in general d/t malnutrition in light of illness.  Recommend f/u w/ MBSS tomorrow to better determine pt's degree of oropharyngeal phase dysphagia and any impact on the safety of his swallowing during oral intake. Pt/Dtr agreed. NSG updated. Consulted w/ Dietician.   HPI HPI: Pt is a 67 y.o. 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 intake, and Sodium of 124. CT neck noted for necrotic lymphadenopathy, left level 2/3 station, measuring 2.6 x 2.5 x 3.8 cm. Likely metastatic. Posttreatment changes of oral and hypopharyngeal mucosa and postsurgical changes related to Right cervical node dissection. 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 He underwent right side neck dissection on 05/13/2014. Per daughter, for the past couple of months, patient has been progressively getting worse.  He lies in his bed all day long, only gets up when he needs to go to bathroom.  Very poor appetite and p.o. intake.  Weight loss.  Also has noticed left neck swelling for couple of months. Pt endorses swallowing difficulties moreso w/ solids - "sometimes it goes down, and sometimes it doesn't".  Pt's speech is mdoerate+ Dysarthric which is says is baseline since the previous  Ca and XRT w/ neck dissection. Pt has been attempting to eat/drink at home stating it is "easier" to drink "my coffee usually".  CXR revealed: No focal consolidation, pleural effusions; emphysematous changes of the lungs with hyperinflation.       SLP Plan   Continue with current plan of care;New goals to be determined pending instrumental study       Recommendations  Diet recommendations: Dysphagia 1 (puree);Thin liquid Liquids provided via: Cup;Straw Medication Administration: Crushed with puree(use chocolate pudding) Supervision: Patient able to self feed;Staff to assist with self feeding;Intermittent supervision to cue for compensatory strategies Compensations: Minimize environmental distractions;Slow rate;Small sips/bites;Lingual sweep for clearance of pocketing;Multiple dry swallows after each bite/sip;Follow solids with liquid Postural Changes and/or Swallow Maneuvers: Seated upright 90 degrees;Upright 30-60 min after meal                General recommendations: (Dietician f/u; Palliative Care f/u) Oral Care Recommendations: Oral care BID;Patient independent with oral care Follow up Recommendations: (TBD) SLP Visit Diagnosis: Dysphagia, oropharyngeal phase (R13.12) Plan: Continue with current plan of care;New goals to be determined pending instrumental study       [name], [place], CCC-SLP Watson,Katherine 02/21/2018, 3:40 PM

## 2018-02-21 NOTE — Progress Notes (Signed)
[facility] [place] NAME: Thomas [name]    MR#:  [id number]  DATE OF BIRTH:  October 09, 1950  SUBJECTIVE:  CHIEF COMPLAINT:   Chief Complaint  Patient presents with  . Neck Pain  Patient seen today Has generalized weakness Patient fell today while he was walking to the bathroom No head injury No loss of consciousness Was seen by speech therapy and recommended pured diet  REVIEW OF SYSTEMS:    ROS  CONSTITUTIONAL: No documented fever. Has fatigue, weakness. No weight gain, has weight loss.  EYES: No blurry or double vision.  ENT: No tinnitus. No postnasal drip. No redness of the oropharynx.  RESPIRATORY: No cough, no wheeze, no hemoptysis. No dyspnea.  CARDIOVASCULAR: No chest pain. No orthopnea. No palpitations. No syncope.  GASTROINTESTINAL: No nausea, no vomiting or diarrhea. No abdominal pain. No melena or hematochezia.  GENITOURINARY: No dysuria or hematuria.  ENDOCRINE: No polyuria or nocturia. No heat or cold intolerance.  HEMATOLOGY: No anemia. No bruising. No bleeding.  INTEGUMENTARY: No rashes. No lesions.  MUSCULOSKELETAL: No arthritis. No swelling. No gout.  NEUROLOGIC: No numbness, tingling, or ataxia. No seizure-type activity.  PSYCHIATRIC: No anxiety. No insomnia. No ADD.   DRUG ALLERGIES:  No Known Allergies  VITALS:  Blood pressure (!) 85/59, pulse (!) 117, temperature (!) 97.4 F (36.3 C), temperature source Oral, resp. rate 12, height 5\' 7"  (1.702 m), weight 51.3 kg (113 lb 1.5 oz), SpO2 (!) 87 %.  PHYSICAL EXAMINATION:   Physical Exam  GENERAL:  67 y.o.-year-old cachectic male patient lying in the bed with no acute distress.  EYES: Pupils equal, round, reactive to light and accommodation. No scleral icterus. Extraocular muscles intact.  HEENT: Head atraumatic, normocephalic. Oropharynx and nasopharynx clear.  Cervical lymphadenopathy NECK:  Supple, no jugular venous distention. No thyroid enlargement, no  tenderness.  LUNGS: Normal breath sounds bilaterally, no wheezing, rales, rhonchi. No use of accessory muscles of respiration.  CARDIOVASCULAR: S1, S2 normal. No murmurs, rubs, or gallops.  ABDOMEN: Soft, nontender, nondistended. Bowel sounds present. No organomegaly or mass.  EXTREMITIES: No cyanosis, clubbing or edema b/l.    NEUROLOGIC: Cranial nerves II through XII are intact. No focal Motor or sensory deficits b/l.   PSYCHIATRIC: The patient is alert and oriented x 3.  SKIN: No obvious rash, lesion, or ulcer.   LABORATORY PANEL:   CBC Recent Labs  Lab 02/19/18 1308  WBC 7.1  HGB 11.5*  HCT 33.0*  PLT 297   ------------------------------------------------------------------------------------------------------------------ Chemistries  Recent Labs  Lab 02/21/18 0433  NA 136  K 3.7  CL 102  CO2 29  GLUCOSE 111*  BUN 18  CREATININE 1.20  CALCIUM 8.7*  MG 2.0   ------------------------------------------------------------------------------------------------------------------  Cardiac Enzymes No results for input(s): TROPONINI in the last 168 hours. ------------------------------------------------------------------------------------------------------------------  RADIOLOGY:  [place] Core Biopsy (lymph Nodes)  Result Date: 02/21/2018 INDICATION: 67 year old with history of squamous cell carcinoma of the tongue. Status post radiation and surgical treatment. Patient has a left sided neck mass and presents for biopsy. EXAM: ULTRASOUND-GUIDED FINE-NEEDLE ASPIRATION AND CORE BIOPSY OF LEFT CERVICAL MASS/LYMPH NODE MEDICATIONS: None. ANESTHESIA/SEDATION: None FLUOROSCOPY TIME:  None COMPLICATIONS: None immediate. PROCEDURE: Informed written consent was obtained from the patient's daughter after a thorough discussion of the procedural risks, benefits and alternatives. All questions were addressed. A timeout was performed prior to the initiation of the procedure. The area of concern was  identified with ultrasound. The left side of neck was prepped with  chlorhexidine and a sterile field was created. Skin and soft tissues were anesthetized with 1% lidocaine. Using ultrasound guidance, 6 fine-needle aspirations were obtained with 25 gauge needles. Subsequently, 3 core biopsies were obtained with an 18 gauge core device. No significant bleeding or hematoma formation. Bandage placed over the puncture site. FINDINGS: Poorly defined heterogeneous soft tissue surrounding the left carotid artery at the bifurcation. Fine-needle aspiration and core biopsy needles identified within the lesion. IMPRESSION: Ultrasound-guided fine-needle aspiration and core biopsies of the left neck mass. Electronically Signed   By: Markus Daft M.D.   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 Management plans discussed with the patient, family and they are in agreement.  CODE STATUS: Full code  DVT Prophylaxis: SCDs  TOTAL TIME TAKING CARE OF THIS PATIENT: 35 minutes.   POSSIBLE D/C IN 2 to 3 DAYS, DEPENDING ON CLINICAL CONDITION.  Saundra Shelling M.D on 02/21/2018 at 12:59 PM  Between 7am to 6pm - Pager - [phone]  After 6pm go  to [email or web address] - password [email or web address] [facility]  Office  [phone]  CC: Primary care physician; [facility]  Note: This dictation was prepared with Dragon dictation along with smaller phrase technology. Any transcriptional errors that result from this process are unintentional.

## 2018-02-22 ENCOUNTER — Inpatient Hospital Stay: Payer: Medicare Other

## 2018-02-22 LAB — BASIC METABOLIC PANEL
Anion gap: 5 (ref 5–15)
BUN: 17 mg/dL (ref 8–23)
CALCIUM: 8.5 mg/dL — AB (ref 8.9–10.3)
CO2: 28 mmol/L (ref 22–32)
Chloride: 106 mmol/L (ref 98–111)
Creatinine, Ser: 1.05 mg/dL (ref 0.61–1.24)
GFR calc non Af Amer: >60 mL/min (ref >60)
Glucose, Bld: 120 mg/dL — ABNORMAL HIGH (ref 70–99)
Potassium: 4.3 mmol/L (ref 3.5–5.1)
SODIUM: 139 mmol/L (ref 135–145)

## 2018-02-22 LAB — SURGICAL PATHOLOGY

## 2018-02-22 LAB — CYTOLOGY - NON PAP

## 2018-02-22 MED ORDER — LABETALOL HCL 5 MG/ML IV SOLN
10.0000 mg | INTRAVENOUS | Status: DC | PRN
  Administered 2018-02-22 – 2018-02-23 (×2): 10 mg via INTRAVENOUS
  Filled 2018-02-22 (×2): qty 4

## 2018-02-22 MED ORDER — BISACODYL 5 MG PO TBEC: 5.0000 mg | DELAYED_RELEASE_TABLET | Freq: Every day | ORAL | Status: DC | PRN

## 2018-02-22 MED ORDER — LORAZEPAM 1 MG PO TABS: 1.0000 mg | ORAL_TABLET | Freq: Four times a day (QID) | ORAL | Status: DC | PRN

## 2018-02-22 MED ORDER — OXYCODONE HCL 5 MG PO TABS: 5.0000 mg | ORAL_TABLET | Freq: Four times a day (QID) | ORAL | Status: DC | PRN

## 2018-02-22 MED ORDER — SENNOSIDES-DOCUSATE SODIUM 8.6-50 MG PO TABS
2.0000 | ORAL_TABLET | Freq: Every day | ORAL | Status: DC
  Administered 2018-02-22 – 2018-03-01 (×3): 2 via ORAL
  Filled 2018-02-22 (×3): qty 2

## 2018-02-22 MED ORDER — SENNOSIDES-DOCUSATE SODIUM 8.6-50 MG PO TABS: 1.0000 | ORAL_TABLET | Freq: Every day | ORAL | Status: DC

## 2018-02-22 MED ORDER — LORAZEPAM 2 MG/ML IJ SOLN: 1.0000 mg | Freq: Four times a day (QID) | INTRAMUSCULAR | Status: DC | PRN

## 2018-02-22 NOTE — Evaluation (Signed)
Objective Swallowing Evaluation: Type of Study: MBS-Modified Barium Swallow Study   Patient Details  Name: Thomas [name] MRN: [id number] Date of Birth: 03/22/1951  Today's Date: 02/22/2018 Time: SLP Start Time (ACUTE ONLY): 1330 -SLP Stop Time (ACUTE ONLY): 1415  SLP Time Calculation (min) (ACUTE ONLY): 45 min   Past Medical History:  Past Medical History:  Diagnosis Date  . 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 Enlarged prostate   . Hypertension   . Lung infection    no lung cancer  . Radiation    to neck  . Renal disorder   . Skin cancer   . Status post chemotherapy   . Tongue cancer [facility])    Past Surgical History:  Past Surgical History:  Procedure Laterality Date  . cancerous lymph nodes removed from neck    . KNEE SURGERY Right   . SKIN BIOPSY     HPI: Pt is a 67 y.o. 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 intake, and Sodium of 124. CT neck noted for necrotic lymphadenopathy, left level 2/3 station, measuring 2.6 x 2.5 x 3.8 cm. Likely metastatic. Posttreatment changes of oral and hypopharyngeal mucosa and postsurgical changes related to Right cervical node dissection. 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 He underwent right side neck dissection on 05/13/2014. Per daughter, for the past couple of months, patient has been progressively getting worse.  He lies in his bed all day long, only gets up when he needs to go to bathroom.  Very poor appetite and p.o. intake.  Weight loss.  Also  has noticed left neck swelling for couple of months. Pt endorses swallowing difficulties moreso w/ solids - "sometimes it goes down, and sometimes it doesn't".  Pt's speech is mdoerate+ Dysarthric which is says is baseline since the previous Ca and XRT w/ neck dissection. Pt has been attempting to eat/drink at home stating it is "easier" to drink "my coffee usually".  CXR revealed: No focal consolidation, pleural effusions; emphysematous changes of the lungs with hyperinflation.    Subjective: Pt is verbally conversive but w/ Dysarthria.    Assessment / Plan / Recommendation  CHL IP CLINICAL IMPRESSIONS 02/22/2018  Clinical Impression 67 year old man with history of head and neck cancer, severe malnutrition, and multiple comorbidities is presenting with severe oropharyngeal dysphagia characterized by slow, disorganized oral management, delayed swallow pharyngeal initiation, reduced tongue base retraction, reduced hyolaryngeal excursion, moderate-severe pharyngeal residue, and aspiration of pharyngeal residue.  These deficits are consistentl with the late effects of radiation on swallowing and are not likely to improve with time.  The patient is at severe risk for aspiration with PO intake.  SLP Visit Diagnosis Dysphagia, oropharyngeal phase (R13.12)  Attention and concentration deficit following --  Frontal lobe and executive function deficit following --  Impact on safety and function Severe aspiration risk      CHL IP TREATMENT RECOMMENDATION 02/20/2018  Treatment Recommendations      Prognosis 02/22/2018  Prognosis for Safe Diet Advancement Guarded  Barriers to  Reach Goals Severity of deficits;Time post onset  Barriers/Prognosis Comment --    CHL IP DIET RECOMMENDATION 02/21/2018  SLP Diet Recommendations NPO  Liquid Administration via --  Medication Administration --  Compensations Minimize environmental distractions;Slow rate;Small sips/bites;Lingual sweep for clearance of  pocketing;Multiple dry swallows after each bite/sip;Follow solids with liquid  Postural Changes --      Objective:  Radiological Procedure: A videoflouroscopic evaluation of oral-preparatory, reflex initiation, and pharyngeal phases of the swallow was performed; as well as a screening of the upper esophageal phase.  I. POSTURE: Upright in MBS chair  II. VIEW: Lateral  III. COMPENSATORY STRATEGIES: N/A  IV. BOLUSES ADMINISTERED:   Thin Liquid: 1 teaspoon presentation   Nectar-thick Liquid: 2 teaspoon presentations, 1 self-administered cup rim   Honey-thick Liquid: DNT   Puree: 1 teaspoon presentation   Mechanical Soft: DNT for patient safety  V. RESULTS OF EVALUATION: A. ORAL PREPARATORY PHASE: (The lips, tongue, and velum are observed for strength and coordination)       **Overall Severity Rating: Moderate; slow and disorganized posterior transfer with oral residue post swallow  B. SWALLOW INITIATION/REFLEX: (The reflex is normal if "triggered" by the time the bolus reached the base of the tongue)  **Overall Severity Rating: Severe; triggers at the pyriform sinuses  C. PHARYNGEAL PHASE: (Pharyngeal function is normal if the bolus shows rapid, smooth, and continuous transit through the pharynx and there is no pharyngeal residue after the swallow)  **Overall Severity Rating: Severe; reduced tongue base retraction, reduced hyolaryngeal excursion, 1/2 of bolus remains in pharynx after swallow resulting in moderate-severe pharyngeal residue with most boluses  D. LARYNGEAL PENETRATION: (Material entering into the laryngeal inlet/vestibule but not aspirated) laryngeal penetration with spoon presentation of first two trials (nectar-thick liquid)  E. ASPIRATION: aspiration from pharyngeal residue        [name], MS/CCC- SLP    [name], [name] 02/22/2018, 2:31 PM

## 2018-02-22 NOTE — Clinical Social Work Note (Signed)
Clinical Social Work Assessment  Patient Details  Name: Thomas [name] MRN: [id number] Date of Birth: 28-Sep-1950  Date of referral:  02/22/18               Reason for consult:  Facility Placement                Permission sought to share information with:  Case Manager, Customer service manager, Family Supports Permission granted to share information::  Yes, Verbal Permission Granted  Name::      SNF  Agency::   [facility]  Relationship::     Contact Information:     Housing/Transportation Living arrangements for the past 2 months:  [place] of Information:  Patient, Adult Children Patient Interpreter Needed:  None Criminal Activity/Legal Involvement Pertinent to Current Situation/Hospitalization:  No - Comment as needed Significant Relationships:  Adult Children Lives with:  Adult Children Do you feel safe going back to the place where you live?  No Need for family participation in patient care:  Yes (Comment)  Care giving concerns:  Patient lives with daughter in [place]    Social Worker assessment / plan:  CSW received consult for housing concerns for patient. CSW met with patient and daughter Thomas [name] at bedside. CSW introduced self and explained role. Patient states that he lives with his daughter. Daughter states that patient needs to go to SNF for rehab for strengthening. CSW explained that patient will need therapy eval. Daughter states that the doctor put in therapy order today. CSW explained bed search process. Patient is in agreement to go to SNF. CSW also explained the Medicare 3 night rule. Patient and daughter are in agreement with bed search. CSW will initiate bed search and give bed offers once received. CSW will follow for discharge plans.   Employment status:  Unemployed Forensic scientist:  Medicare PT Recommendations:  Not assessed at this time Information / Referral to community resources:  [place]  Patient/Family's Response to care:  Patient thanked CSW for assistance   Patient/Family's Understanding of and Emotional Response to Diagnosis, Current Treatment, and Prognosis:  Daughter states she is very thankful for the help and for assistance with placement.   Emotional Assessment Appearance:  Appears older than stated age Attitude/Demeanor/Rapport:    Affect (typically observed):  Restless, Guarded Orientation:  Oriented to Self, Oriented to Place, Oriented to  Time Alcohol / Substance use:  Alcohol Use Psych involvement (Current and /or in the community):  No (Comment)  Discharge Needs  Concerns to be addressed:  Discharge Planning Concerns Readmission within the last 30 days:  No Current discharge risk:  None Barriers to Discharge:  Continued Medical Work up   [employer], [place] 02/22/2018, 2:58 PM

## 2018-02-22 NOTE — Care Management Important Message (Signed)
Important Message  Patient Details  Name: Thomas [name] MRN: [id number] Date of Birth: [birth year]   Medicare Important Message Given:  Yes    [name] 02/22/2018, 11:33 AM

## 2018-02-22 NOTE — Care Management Note (Signed)
Case Management Note  Patient Details  Name: Thomas [name] MRN: [id number] Date of Birth: Jul 25, 1951  Subjective/Objective:                 Admitted from home with recurrence of head and neck cancer.  has failed barium swallowing study and GI consult is pending.  Palliative is consulting and patient to remain a full code and at this time wants everything done.  Patient very weak and debilitated.    Action/Plan:  Obtained order for physical therapy and occupational therapy  Expected Discharge Date:  02/22/18               Expected Discharge Plan:     In-House Referral:     Discharge planning Services     Post Acute Care Choice:    Choice offered to:     DME Arranged:    DME Agency:     HH Arranged:    HH Agency:     Status of Service:     If discussed at [employer], dates discussed:    Additional Comments:  Katrina Stack, RN 02/22/2018, 3:16 PM

## 2018-02-22 NOTE — Progress Notes (Addendum)
Hematology/Oncology Progress Note [facility] [phone] [phone]  Patient Care Team: [facility] as PCP - General   Name of the patient: Thomas [name]  [id number]  02-09-1951  Date of visit: 02/22/18  INTERVAL HISTORY-  Patient was seen at bedside. No acute over night issue.  Complained about constipation, also mucus in his throat.  Daughter at bedside. Reports patient has difficulty swallowing percocet, even when it was crushed and mixed with apple souse. Requests another alternative such as oxycodone which patient used to take and had no problem swallowing.    Review of systems- Review of Systems  Constitutional: Positive for malaise/fatigue. Negative for chills and fever.  HENT: Positive for hearing loss.        Swallowing difficulty  Eyes: Negative for pain.  Respiratory: Negative for shortness of breath.   Cardiovascular: Negative for chest pain.  Gastrointestinal: Positive for constipation. Negative for nausea and vomiting.  Genitourinary: Negative for hematuria.  Musculoskeletal: Negative for myalgias.  Skin: Negative for rash.  Neurological: Positive for weakness.  Endo/Heme/Allergies: Does not bruise/bleed easily.  Psychiatric/Behavioral: Negative for depression.    No Known Allergies  Patient Active Problem List   Diagnosis Date Noted  . Neck mass   . Cancer ([place]) 02/19/2018  . Cervical lymphadenopathy   . Poor appetite   . Loss of weight   . History of tongue cancer   . Protein-calorie malnutrition, severe 01/22/2016  . Alcohol use disorder, severe, dependence ([place]) 12/12/2014  . Hyponatremia 12/12/2014  . Essential hypertension 12/12/2014  . COPD (chronic obstructive pulmonary disease) ([place]) 12/12/2014     Past Medical History:  Diagnosis Date  . Cancer of nasal cavities ([place])   . Enlarged prostate   . Hypertension   . Lung infection    no lung cancer  . Radiation    to neck  . Renal  disorder   . Skin cancer   . Status post chemotherapy   . Tongue cancer [facility])      Past Surgical History:  Procedure Laterality Date  . cancerous lymph nodes removed from neck    . KNEE SURGERY Right   . SKIN BIOPSY      Social History   Socioeconomic History  . Marital status: Divorced    Spouse name: Not on file  . Number of children: Not on file  . Years of education: Not on file  . Highest education level: Not on file  Occupational History  . Not on file  Social Needs  . Financial resource strain: Not on file  . Food insecurity:    Worry: Not on file    Inability: Not on file  . Transportation needs:    Medical: Not on file    Non-medical: Not on file  Tobacco Use  . Smoking status: Heavy Tobacco Smoker    Types: Cigarettes  . Smokeless tobacco: Never Used  Substance and Sexual Activity  . Alcohol use: Yes    Comment: 40oz x4 daily  . Drug use: No  . Sexual activity: Not on file  Lifestyle  . Physical activity:    Days per week: Not on file    Minutes per session: Not on file  . Stress: Not on file  Relationships  . Social connections:    Talks on phone: Not on file    Gets together: Not on file    Attends religious service: Not on file    Active member of club or organization: Not  on file    Attends meetings of clubs or organizations: Not on file    Relationship status: Not on file  . Intimate partner violence:    Fear of current or ex partner: Not on file    Emotionally abused: Not on file    Physically abused: Not on file    Forced sexual activity: Not on file  Other Topics Concern  . Not on file  Social History Narrative  . Not on file     Family History  Problem Relation Age of Onset  . Hypertension Mother      Current Facility-Administered Medications:  .  0.9 %  sodium chloride infusion, , Intravenous, Continuous, Pyreddy, Pavan, MD, Last Rate: 50 mL/hr at 02/22/18 0243 .  acetaminophen (TYLENOL) tablet 650 mg, 650 mg, Oral, Q6H PRN  **OR** acetaminophen (TYLENOL) suppository 650 mg, 650 mg, Rectal, Q6H PRN, Salary, Montell D, MD .  bisacodyl (DULCOLAX) EC tablet 5 mg, 5 mg, Oral, Daily PRN, Pyreddy, Pavan, MD .  clonazePAM (KLONOPIN) tablet 1 mg, 1 mg, Oral, TID PRN, Salary, Montell D, MD, 1 mg at 02/20/18 0053 .  DULoxetine (CYMBALTA) DR capsule 30 mg, 30 mg, Oral, Daily, Salary, Montell D, MD, 30 mg at 02/22/18 0929 .  feeding supplement (ENSURE ENLIVE) (ENSURE ENLIVE) liquid 237 mL, 237 mL, Oral, TID WC, Earlie Server, MD, 237 mL at 02/22/18 1309 .  fluticasone (FLONASE) 50 MCG/ACT nasal spray 1 spray, 1 spray, Each Nare, Daily, Salary, Montell D, MD, 1 spray at 02/22/18 0932 .  folic acid (FOLVITE) tablet 1 mg, 1 mg, Oral, Daily, Salary, Montell D, MD, 1 mg at 02/22/18 0929 .  heparin injection 5,000 Units, 5,000 Units, Subcutaneous, Q8H, Salary, Montell D, MD, 5,000 Units at 02/22/18 0641 .  ipratropium-albuterol (DUONEB) 0.5-2.5 (3) MG/3ML nebulizer solution 3 mL, 3 mL, Nebulization, Q6H PRN, Pyreddy, Pavan, MD .  levothyroxine (SYNTHROID, LEVOTHROID) tablet 50 mcg, 50 mcg, Oral, QAC breakfast, Salary, Montell D, MD, 50 mcg at 02/22/18 0811 .  megestrol (MEGACE) tablet 40 mg, 40 mg, Oral, BID, Earlie Server, MD, 40 mg at 02/22/18 0932 .  mometasone-formoterol (DULERA) 100-5 MCG/ACT inhaler 2 puff, 2 puff, Inhalation, BID, Salary, Montell D, MD, 2 puff at 02/22/18 0814 .  morphine 2 MG/ML injection 2 mg, 2 mg, Intravenous, Q2H PRN, Salary, Montell D, MD, 2 mg at 02/22/18 0243 .  multivitamin with minerals tablet 1 tablet, 1 tablet, Oral, Daily, Salary, Montell D, MD, 1 tablet at 02/22/18 0929 .  nicotine (NICODERM CQ - dosed in mg/24 hours) patch 14 mg, 14 mg, Transdermal, Daily, Salary, Montell D, MD, 14 mg at 02/22/18 0932 .  ondansetron (ZOFRAN) tablet 4 mg, 4 mg, Oral, Q6H PRN **OR** ondansetron (ZOFRAN) injection 4 mg, 4 mg, Intravenous, Q6H PRN, Salary, Montell D, MD .  oxyCODONE (Oxy IR/ROXICODONE) immediate release tablet 5  mg, 5 mg, Oral, Q6H PRN, Earlie Server, MD .  polyethylene glycol (MIRALAX / GLYCOLAX) packet 17 g, 17 g, Oral, Daily PRN, Salary, Montell D, MD .  senna-docusate (Senokot-S) tablet 2 tablet, 2 tablet, Oral, Daily, Earlie Server, MD .  thiamine (VITAMIN B-1) tablet 100 mg, 100 mg, Oral, Daily, 100 mg at 02/22/18 0929 **OR** thiamine (B-1) injection 100 mg, 100 mg, Intravenous, Daily, Salary, Montell D, MD .  traZODone (DESYREL) tablet 50 mg, 50 mg, Oral, QHS PRN, Henreitta Leber, MD, 50 mg at 02/21/18 2325   Physical exam:  Vitals:   02/21/18 2208 02/22/18 0008 02/22/18 0429 02/22/18 1049  BP: 137/88 124/77 125/85 (!) 138/95  Pulse: (!) 104 (!) 108 (!) 130 (!) 125  Resp: 18 17 18 16   Temp:   98.3 F (36.8 C) 98.3 F (36.8 C)  TempSrc:   Oral Oral  SpO2: 94% 95% 96% 92%  Weight:      Height:       Physical Exam  Constitutional: No distress.  Frail appearance  HENT:  Head: Normocephalic and atraumatic.  Nose: Nose normal.  Mouth/Throat: Oropharynx is clear and moist. No oropharyngeal exudate.  History of partial rhinectomy with skin graft  Eyes: Pupils are equal, round, and reactive to light. EOM are normal. Left eye exhibits no discharge. No scleral icterus.  Neck: Normal range of motion.  Left neck skin hardening/fixed mass.  Cardiovascular: Normal rate and regular rhythm.  No murmur heard. Pulmonary/Chest: Effort normal. No respiratory distress. He has no rales.  Abdominal: Soft. He exhibits no distension. There is no tenderness.  Musculoskeletal: Normal range of motion. He exhibits no edema.  Neurological: He is alert. No cranial nerve deficit.  Skin: Skin is warm and dry. He is not diaphoretic.  Neck hyperpigmentation /chronic radiation changes.  Psychiatric: Mood normal.      CMP Latest Ref Rng & Units 02/22/2018  Glucose 70 - 99 mg/dL 120(H)  BUN 8 - 23 mg/dL 17  Creatinine 0.61 - 1.24 mg/dL 1.05  Sodium 135 - 145 mmol/L 139  Potassium 3.5 - 5.1 mmol/L 4.3  Chloride 98 -  111 mmol/L 106  CO2 22 - 32 mmol/L 28  Calcium 8.9 - 10.3 mg/dL 8.5(L)  Total Protein 6.5 - 8.1 g/dL -  Total Bilirubin 0.3 - 1.2 mg/dL -  Alkaline Phos 38 - 126 U/L -  AST 15 - 41 U/L -  ALT 17 - 63 U/L -   CBC Latest Ref Rng & Units 02/19/2018  WBC 3.8 - 10.6 K/uL 7.1  Hemoglobin 13.0 - 18.0 g/dL 11.5(L)  Hematocrit 40.0 - 52.0 % 33.0(L)  Platelets 150 - 440 K/uL 297   RADIOGRAPHIC STUDIES: I have personally reviewed the radiological images as listed and agreed with the findings in the report.  Dg Chest 2 View  Result Date: 02/19/2018 CLINICAL DATA:  67 year old male with shortness of breath and COPD. EXAM: CHEST - 2 VIEW COMPARISON:  Chest radiograph dated 02/09/2016 FINDINGS: There is emphysematous changes of the lungs with hyperinflation. Diffuse interstitial coarsening. No focal consolidation, pleural effusion, or pneumothorax. The cardiac silhouette is within normal limits. Atherosclerotic calcification of the aortic arch. No acute osseous pathology. Multiple small radiopaque foci over the left chest wall and left upper extremity soft tissues. Clinical correlation is recommended. IMPRESSION: 1. No acute cardiopulmonary process. 2. Emphysema. Electronically Signed   By: Anner Crete M.D.   On: 02/19/2018 06:13   Ct Soft Tissue Neck W Contrast  Result Date: 02/19/2018 CLINICAL DATA:  67 y/o M; history of tongue cancer post radiation. KNot on the left-sided neck. Squamous cell carcinoma of the nose. EXAM: CT NECK WITH CONTRAST TECHNIQUE: Multidetector CT imaging of the neck was performed using the standard protocol following the bolus administration of intravenous contrast. CONTRAST:  65mL OMNIPAQUE IOHEXOL 300 MG/ML  SOLN COMPARISON:  05/21/2013 PET-CT. FINDINGS: Pharynx and larynx: Diffuse smooth mucosal thickening of the oral and hypopharynx compatible with posttreatment changes. No exophytic enhancing nodular component. Salivary glands: Absent right submandibular gland. Additional  salivary glands are unremarkable. Thyroid: Normal. Lymph nodes: Status post right radical neck dissection. No nodular enhancing disease within the  right cervical chain to suggest recurrence. Necrotic lymphadenopathy in the left level 2/3 station measuring 2.6 x 2.5 x 3.8 cm (AP x ML x CC series 2, image 51 and series 7, image 60). No additional cervical lymphadenopathy identified. Vascular: Dense calcified plaque of the carotid bifurcations bilaterally with mild to moderate 50% left proximal ICA stenosis. Mild less than 50% proximal right ICA stenosis. The right vertebral artery neck is diffusely small in caliber with thickened wall and multiple areas of stenosis likely representing radiation changes. Limited intracranial: Negative. Visualized orbits: Negative. Mastoids and visualized paranasal sinuses: Moderate mucosal thickening of the maxillary sinuses bilaterally with chronic inflammatory changes of the sinus walls. Left mastoid tip effusion. Normal aeration of the right mastoid air cells. Skeleton: Moderate cervical spondylosis. No acute osseous abnormality identified. No high-grade bony canal stenosis. Upper chest: Mild centrilobular emphysema of the upper lobes. Clustered ground-glass opacities and mucous plugging in the left upper lobe, likely bronchopneumonia. Other: None. IMPRESSION: 1. Necrotic lymphadenopathy measuring up to 3.8 cm at the left level 2/3 cervical node station, likely metastatic. No additional lymphadenopathy identified. 2. Posttreatment changes of the oral and hypopharyngeal mucosa and postsurgical changes related to right cervical node dissection. 3. Moderate mucosal thickening of the maxillary sinuses and left mastoid tip effusion. 4. Calcific atherosclerosis of carotid bifurcations with mild to moderate 50% left proximal ICA stenosis. 5. Left upper lobe ground-glass opacities in bronchovascular distribution, likely bronchopneumonia. Electronically Signed   By: Kristine Garbe  M.D.   On: 02/19/2018 04:36   US Renal  Result Date: 02/19/2018 CLINICAL DATA:  Chronic renal disease EXAM: RENAL / URINARY TRACT ULTRASOUND COMPLETE COMPARISON:  None. FINDINGS: Right Kidney: Length: 10.2 cm. Echogenicity and renal cortical thickness are within normal limits. No mass, perinephric fluid, or hydronephrosis visualized. No sonographically demonstrable calculus or ureterectasis. Left Kidney: Length: 10.0 cm. Echogenicity and renal cortical thickness are within normal limits. No mass, perinephric fluid, or hydronephrosis visualized. There are nonobstructing calculi in the left kidney, largest measuring 9 mm. No ureterectasis. Bladder: Appears normal for degree of bladder distention. IMPRESSION: Nonobstructing calculi in left kidney. Study otherwise unremarkable. Electronically Signed   By: Lowella Grip III M.D.   On: 02/19/2018 12:00   [place] Core Biopsy (lymph Nodes)  Result Date: 02/21/2018 INDICATION: 67 year old with history of squamous cell carcinoma of the tongue. Status post radiation and surgical treatment. Patient has a left sided neck mass and presents for biopsy. EXAM: ULTRASOUND-GUIDED FINE-NEEDLE ASPIRATION AND CORE BIOPSY OF LEFT CERVICAL MASS/LYMPH NODE MEDICATIONS: None. ANESTHESIA/SEDATION: None FLUOROSCOPY TIME:  None COMPLICATIONS: None immediate. PROCEDURE: Informed written consent was obtained from the patient's daughter after a thorough discussion of the procedural risks, benefits and alternatives. All questions were addressed. A timeout was performed prior to the initiation of the procedure. The area of concern was identified with ultrasound. The left side of neck was prepped with chlorhexidine and a sterile field was created. Skin and soft tissues were anesthetized with 1% lidocaine. Using ultrasound guidance, 6 fine-needle aspirations were obtained with 25 gauge needles. Subsequently, 3 core biopsies were obtained with an 18 gauge core device. No significant bleeding or  hematoma formation. Bandage placed over the puncture site. FINDINGS: Poorly defined heterogeneous soft tissue surrounding the left carotid artery at the bifurcation. Fine-needle aspiration and core biopsy needles identified within the lesion. IMPRESSION: Ultrasound-guided fine-needle aspiration and core biopsies of the left neck mass. Electronically Signed   By: Markus Daft M.D.   On: 02/21/2018 11:08   Dg Hip  Unilat With Pelvis 2-3 Views Left  Result Date: 02/21/2018 CLINICAL DATA:  Fall. EXAM: DG HIP (WITH OR WITHOUT PELVIS) 2-3V LEFT COMPARISON:  No prior. FINDINGS: Diffuse left hip degenerative change. Diffuse osteopenia. No acute bony or joint abnormality. No evidence of fracture dislocation. Peripheral vascular calcification. IMPRESSION: 1. Diffuse degenerative change in osteopenia. No acute abnormality. 2.  Peripheral vascular disease. Electronically Signed   By: Marcello Moores  Register   On: 02/21/2018 14:47   Dg Hip Unilat With Pelvis 2-3 Views Right  Result Date: 02/21/2018 CLINICAL DATA:  Fall from bed EXAM: DG HIP (WITH OR WITHOUT PELVIS) 2-3V RIGHT COMPARISON:  None. FINDINGS: Early degenerative changes with early spurring bilaterally. Joint spaces maintained. SI joints are symmetric and unremarkable. No acute bony abnormality. Specifically, no fracture, subluxation, or dislocation. Vascular calcifications. IMPRESSION: No acute bony abnormality. Electronically Signed   By: Rolm Baptise M.D.   On: 02/21/2018 14:47    Assessment and plan-  Patient is a 67 y.o. 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   left neck mass biopsy by IR, discussed with pathologist  Dr.Onley today. Pathology is positive for squamous cell carcinoma, with dense fibrous tissue. Discussed with patient and daughter about pathology and diagnosis of recurrent head and neck cancer. Recommend outpatient PET scan.  Likely need chemotherapy +/- immunotherapy. Given his current performance status, considering starting monotherapy with immunotherapy.   #Weight loss/ severe malnutrition, continue megace, Ensure supplements.  He had barium swallowing test today and results pending.  Continue Megace 40 mg twice daily for appetite stimulants.  Continue Ensure 3 times daily with meal aspiration precaution.  Pending feeding tube physician on barium swallow study for further determination of severity of oropharyngeal dysphagia  # Hyponatremia, improving.  # Switch to oxycodone 5mg  Q6h PRN.   #Hypothyroidism continue Synthroid 50 MCG daily. Total face to face encounter time for this patient visit was 35 min. >50% of the time was  spent in counseling and coordination of care.   Earlie Server, MD, PhD Hematology Oncology [facility] Pager- [phone] 02/22/2018

## 2018-02-22 NOTE — NC FL2 (Signed)
[place] LEVEL OF CARE SCREENING TOOL     IDENTIFICATION  Patient Name: Thomas [name] Birthdate: [birth year] Sex: male Admission Date (Current Location): 02/19/2018  [place] and [place] Number:  Engineering geologist and Address:  [facility], [address], [place], [place] [address]      Provider Number: [id number]  Attending Physician Name and Address:  Saundra Shelling, MD  Relative Name and Phone Number:  [facility]y [facility]- Daughter [phone]    Current Level of Care: Hospital Recommended Level of Care: [place] Prior Approval Number:    Date Approved/Denied:   PASRR Number: [id number]  Discharge Plan: SNF    Current Diagnoses: Patient Active Problem List   Diagnosis Date Noted  . Neck mass   . Head and neck cancer ([place]) 02/19/2018  . Cervical lymphadenopathy   . Poor appetite   . Loss of weight   . History of tongue cancer   . Protein-calorie malnutrition, severe 01/22/2016  . Alcohol use disorder, severe, dependence ([place]) 12/12/2014  . Hyponatremia 12/12/2014  . Essential hypertension 12/12/2014  . COPD (chronic obstructive pulmonary disease) ([place]) 12/12/2014    Orientation RESPIRATION BLADDER Height & Weight     Self, Time, Place  Normal Continent Weight: 113 lb 1.5 oz (51.3 kg)(bed scale) Height:  5\' 7"  (170.2 cm)  BEHAVIORAL SYMPTOMS/MOOD NEUROLOGICAL BOWEL NUTRITION STATUS  (none) (none) Continent Diet(Dysphagia 1)  AMBULATORY STATUS COMMUNICATION OF NEEDS Skin   Extensive Assist Verbally Normal                       Personal Care Assistance Level of Assistance  Bathing, Feeding, Dressing Bathing Assistance: Limited assistance Feeding assistance: Independent Dressing Assistance: Limited assistance     Functional Limitations Info  Sight, Hearing, Speech Sight Info: Adequate Hearing Info: Adequate Speech Info: Adequate    SPECIAL CARE FACTORS FREQUENCY  PT (By licensed PT), OT (By  licensed OT)     PT Frequency: 5 OT Frequency: 5            Contractures Contractures Info: Not present    Additional Factors Info  Code Status, Allergies Code Status Info: Full Code Allergies Info: NKA           Current Medications (02/22/2018):  This is the current hospital active medication list Current Facility-Administered Medications  Medication Dose Route Frequency Provider Last Rate Last Dose  . 0.9 %  sodium chloride infusion   Intravenous Continuous Saundra Shelling, MD 50 mL/hr at 02/22/18 0243    . acetaminophen (TYLENOL) tablet 650 mg  650 mg Oral Q6H PRN Salary, Montell D, MD       Or  . acetaminophen (TYLENOL) suppository 650 mg  650 mg Rectal Q6H PRN Salary, Montell D, MD      . bisacodyl (DULCOLAX) EC tablet 5 mg  5 mg Oral Daily PRN Pyreddy, Reatha Harps, MD      . clonazePAM (KLONOPIN) tablet 1 mg  1 mg Oral TID PRN Loney Hering D, MD   1 mg at 02/20/18 0053  . DULoxetine (CYMBALTA) DR capsule 30 mg  30 mg Oral Daily Salary, Montell D, MD   30 mg at 02/22/18 0929  . feeding supplement (ENSURE ENLIVE) (ENSURE ENLIVE) liquid 237 mL  237 mL Oral TID WC Earlie Server, MD   237 mL at 02/22/18 1309  . fluticasone (FLONASE) 50 MCG/ACT nasal spray 1 spray  1 spray Each Nare Daily Salary, Avel Peace, MD  1 spray at 02/22/18 0932  . folic acid (FOLVITE) tablet 1 mg  1 mg Oral Daily Salary, Montell D, MD   1 mg at 02/22/18 0929  . heparin injection 5,000 Units  5,000 Units Subcutaneous Q8H Salary, Montell D, MD   5,000 Units at 02/22/18 0641  . ipratropium-albuterol (DUONEB) 0.5-2.5 (3) MG/3ML nebulizer solution 3 mL  3 mL Nebulization Q6H PRN Pyreddy, Pavan, MD      . levothyroxine (SYNTHROID, LEVOTHROID) tablet 50 mcg  50 mcg Oral QAC breakfast Salary, Holly Bodily D, MD   50 mcg at 02/22/18 0811  . megestrol (MEGACE) tablet 40 mg  40 mg Oral BID Earlie Server, MD   40 mg at 02/22/18 0932  . mometasone-formoterol (DULERA) 100-5 MCG/ACT inhaler 2 puff  2 puff Inhalation BID Loney Hering  D, MD   2 puff at 02/22/18 0814  . morphine 2 MG/ML injection 2 mg  2 mg Intravenous Q2H PRN Salary, Montell D, MD   2 mg at 02/22/18 0243  . multivitamin with minerals tablet 1 tablet  1 tablet Oral Daily Salary, Montell D, MD   1 tablet at 02/22/18 0929  . nicotine (NICODERM CQ - dosed in mg/24 hours) patch 14 mg  14 mg Transdermal Daily Salary, Montell D, MD   14 mg at 02/22/18 0932  . ondansetron (ZOFRAN) tablet 4 mg  4 mg Oral Q6H PRN Salary, Montell D, MD       Or  . ondansetron (ZOFRAN) injection 4 mg  4 mg Intravenous Q6H PRN Salary, Montell D, MD      . oxyCODONE (Oxy IR/ROXICODONE) immediate release tablet 5 mg  5 mg Oral Q6H PRN Earlie Server, MD      . polyethylene glycol (MIRALAX / GLYCOLAX) packet 17 g  17 g Oral Daily PRN Salary, Montell D, MD      . senna-docusate (Senokot-S) tablet 2 tablet  2 tablet Oral Daily Earlie Server, MD      . thiamine (VITAMIN B-1) tablet 100 mg  100 mg Oral Daily Salary, Montell D, MD   100 mg at 02/22/18 3903   Or  . thiamine (B-1) injection 100 mg  100 mg Intravenous Daily Salary, Montell D, MD      . traZODone (DESYREL) tablet 50 mg  50 mg Oral QHS PRN Henreitta Leber, MD   50 mg at 02/21/18 2325     Discharge Medications: Please see discharge summary for a list of discharge medications.  Relevant Imaging Results:  Relevant Lab Results:   Additional Information SSN [id number]  Annamaria Boots, [place]

## 2018-02-22 NOTE — Progress Notes (Signed)
[place] [facility] [place] NAME: Thomas [name]    MR#:  [id number]  DATE OF BIRTH:  [birth year]  SUBJECTIVE:  CHIEF COMPLAINT:   Chief Complaint  Patient presents with  . Neck Pain  Patient seen today Has generalized weakness Due for modified barium swallow study today No fever No chest pain  REVIEW OF SYSTEMS:    ROS  CONSTITUTIONAL: No documented fever. Has fatigue, weakness. No weight gain, has weight loss.  EYES: No blurry or double vision.  ENT: No tinnitus. No postnasal drip. No redness of the oropharynx.  RESPIRATORY: No cough, no wheeze, no hemoptysis. No dyspnea.  CARDIOVASCULAR: No chest pain. No orthopnea. No palpitations. No syncope.  GASTROINTESTINAL: No nausea, no vomiting or diarrhea. No abdominal pain. No melena or hematochezia.  GENITOURINARY: No dysuria or hematuria.  ENDOCRINE: No polyuria or nocturia. No heat or cold intolerance.  HEMATOLOGY: No anemia. No bruising. No bleeding.  INTEGUMENTARY: No rashes. No lesions.  MUSCULOSKELETAL: No arthritis. No swelling. No gout.  NEUROLOGIC: No numbness, tingling, or ataxia. No seizure-type activity.  PSYCHIATRIC: No anxiety. No insomnia. No ADD.   DRUG ALLERGIES:  No Known Allergies  VITALS:  Blood pressure (!) 138/95, pulse (!) 125, temperature 98.3 F (36.8 C), temperature source Oral, resp. rate 16, height 5\' 7"  (1.702 m), weight 51.3 kg (113 lb 1.5 oz), SpO2 92 %.  PHYSICAL EXAMINATION:   Physical Exam  GENERAL:  67 y.o.-year-old cachectic male patient lying in the bed with no acute distress.  EYES: Pupils equal, round, reactive to light and accommodation. No scleral icterus. Extraocular muscles intact.  HEENT: Head atraumatic, normocephalic. Oropharynx and nasopharynx clear.  Cervical lymphadenopathy NECK:  Supple, no jugular venous distention. No thyroid enlargement, no tenderness.  LUNGS: Normal breath sounds bilaterally, no wheezing, rales, rhonchi. No use of  accessory muscles of respiration.  CARDIOVASCULAR: S1, S2 normal. No murmurs, rubs, or gallops.  ABDOMEN: Soft, nontender, nondistended. Bowel sounds present. No organomegaly or mass.  EXTREMITIES: No cyanosis, clubbing or edema b/l.    NEUROLOGIC: Cranial nerves II through XII are intact. No focal Motor or sensory deficits b/l.   PSYCHIATRIC: The patient is alert and oriented x 3.  SKIN: No obvious rash, lesion, or ulcer.   LABORATORY PANEL:   CBC Recent Labs  Lab 02/19/18 1308  WBC 7.1  HGB 11.5*  HCT 33.0*  PLT 297   ------------------------------------------------------------------------------------------------------------------ Chemistries  Recent Labs  Lab 02/21/18 0433 02/22/18 0457  NA 136 139  K 3.7 4.3  CL 102 106  CO2 29 28  GLUCOSE 111* 120*  BUN 18 17  CREATININE 1.20 1.05  CALCIUM 8.7* 8.5*  MG 2.0  --    ------------------------------------------------------------------------------------------------------------------  Cardiac Enzymes No results for input(s): TROPONINI in the last 168 hours. ------------------------------------------------------------------------------------------------------------------  RADIOLOGY:  [place] Core Biopsy (lymph Nodes)  Result Date: 02/21/2018 INDICATION: 67 year old with history of squamous cell carcinoma of the tongue. Status post radiation and surgical treatment. Patient has a left sided neck mass and presents for biopsy. EXAM: ULTRASOUND-GUIDED FINE-NEEDLE ASPIRATION AND CORE BIOPSY OF LEFT CERVICAL MASS/LYMPH NODE MEDICATIONS: None. ANESTHESIA/SEDATION: None FLUOROSCOPY TIME:  None COMPLICATIONS: None immediate. PROCEDURE: Informed written consent was obtained from the patient's daughter after a thorough discussion of the procedural risks, benefits and alternatives. All questions were addressed. A timeout was performed prior to the initiation of the procedure. The area of concern was identified with ultrasound. The left side of  neck was prepped with chlorhexidine and a  sterile field was created. Skin and soft tissues were anesthetized with 1% lidocaine. Using ultrasound guidance, 6 fine-needle aspirations were obtained with 25 gauge needles. Subsequently, 3 core biopsies were obtained with an 18 gauge core device. No significant bleeding or hematoma formation. Bandage placed over the puncture site. FINDINGS: Poorly defined heterogeneous soft tissue surrounding the left carotid artery at the bifurcation. Fine-needle aspiration and core biopsy needles identified within the lesion. IMPRESSION: Ultrasound-guided fine-needle aspiration and core biopsies of the left neck mass. Electronically Signed   By: Markus Daft M.D.   On: 02/21/2018 11:08   Dg Hip Unilat With Pelvis 2-3 Views Left  Result Date: 02/21/2018 CLINICAL DATA:  Fall. EXAM: DG HIP (WITH OR WITHOUT PELVIS) 2-3V LEFT COMPARISON:  No prior. FINDINGS: Diffuse left hip degenerative change. Diffuse osteopenia. No acute bony or joint abnormality. No evidence of fracture dislocation. Peripheral vascular calcification. IMPRESSION: 1. Diffuse degenerative change in osteopenia. No acute abnormality. 2.  Peripheral vascular disease. Electronically Signed   By: Marcello Moores  Register   On: 02/21/2018 14:47   Dg Hip Unilat With Pelvis 2-3 Views Right  Result Date: 02/21/2018 CLINICAL DATA:  Fall from bed EXAM: DG HIP (WITH OR WITHOUT PELVIS) 2-3V RIGHT COMPARISON:  None. FINDINGS: Early degenerative changes with early spurring bilaterally. Joint spaces maintained. SI joints are symmetric and unremarkable. No acute bony abnormality. Specifically, no fracture, subluxation, or dislocation. Vascular calcifications. IMPRESSION: No acute bony abnormality. Electronically Signed   By: Rolm Baptise M.D.   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 Management plans discussed with the patient, family and they are in agreement.  CODE STATUS: Full code  DVT Prophylaxis: SCDs  TOTAL TIME TAKING CARE OF THIS PATIENT: 34 minutes.   POSSIBLE D/C IN 2 to 3 DAYS, DEPENDING ON CLINICAL CONDITION.  Saundra Shelling M.D on 02/22/2018 at 2:27 PM  Between 7am to 6pm - Pager - [phone]  After 6pm go to [email or web address] - password [email or web address] [facility]  Office  [phone]  CC: Primary care physician; [facility]  Note: This dictation was prepared with Dragon dictation along with smaller phrase technology. Any transcriptional errors that result from this process are unintentional.

## 2018-02-22 NOTE — Clinical Social Work Placement (Signed)
   CLINICAL SOCIAL WORK PLACEMENT  NOTE  Date:  02/22/2018  Patient Details  Name: Thomas [name] MRN: [id number] Date of Birth: 09/22/1950  Clinical Social Work is seeking post-discharge placement for this patient at the [place] level of care (*CSW will initial, date and re-position this form in  chart as items are completed):  Yes   Patient/family provided with [place] Work Department's list of facilities offering this level of care within the geographic area requested by the patient (or if unable, by the patient's family).  Yes   Patient/family informed of their freedom to choose among providers that offer the needed level of care, that participate in Medicare, Medicaid or managed care program needed by the patient, have an available bed and are willing to accept the patient.  Yes   Patient/family informed of [facility]'s ownership interest in [facility] and [facility], as well as of the fact that they are under no obligation to receive care at these facilities.  PASRR submitted to EDS on 02/22/18     PASRR number received on       Existing PASRR number confirmed on 02/22/18     FL2 transmitted to all facilities in geographic area requested by pt/family on 02/22/18     FL2 transmitted to all facilities within larger geographic area on       Patient informed that his/her managed care company has contracts with or will negotiate with certain facilities, including the following:            Patient/family informed of bed offers received.  Patient chooses bed at       Physician recommends and patient chooses bed at      Patient to be transferred to   on  .  Patient to be transferred to facility by       Patient family notified on   of transfer.  Name of family member notified:        PHYSICIAN       Additional Comment:    _______________________________________________ Annamaria Boots, [place] 02/22/2018, 3:14 PM

## 2018-02-22 NOTE — Plan of Care (Signed)
  Problem: Education: Goal: Knowledge of General Education information will improve Outcome: Progressing   Problem: Health Behavior/Discharge Planning: Goal: Ability to manage health-related needs will improve Outcome: Progressing   Problem: Clinical Measurements: Goal: Ability to maintain clinical measurements within normal limits will improve Outcome: Progressing Goal: Will remain free from infection Outcome: Progressing Goal: Respiratory complications will improve Outcome: Progressing Goal: Cardiovascular complication will be avoided Outcome: Progressing   Problem: Safety: Goal: Ability to remain free from injury will improve Outcome: Progressing   Problem: Skin Integrity: Goal: Risk for impaired skin integrity will decrease Outcome: Progressing   Problem: Clinical Measurements: Goal: Respiratory complications will improve Outcome: Progressing Goal: Cardiovascular complication will be avoided Outcome: Progressing

## 2018-02-22 NOTE — Progress Notes (Signed)
Patient BP and HR were elevated, MD notified. Will continue to monitor patient.

## 2018-02-23 LAB — CBC
HEMATOCRIT: 30 % — AB (ref 40.0–52.0)
HEMOGLOBIN: 10.2 g/dL — AB (ref 13.0–18.0)
MCH: 29.5 pg (ref 26.0–34.0)
MCHC: 34.1 g/dL (ref 32.0–36.0)
MCV: 86.6 fL (ref 80.0–100.0)
Platelets: 272 10*3/uL (ref 150–440)
RBC: 3.46 MIL/uL — AB (ref 4.40–5.90)
RDW: 14.8 % — ABNORMAL HIGH (ref 11.5–14.5)
WBC: 6.8 10*3/uL (ref 3.8–10.6)

## 2018-02-23 NOTE — Evaluation (Signed)
Physical Therapy Evaluation Patient Details Name: Thomas [name] MRN: [id number] DOB: [birth year] Today's Date: 02/23/2018   History of Present Illness  Thomas [name] is a 67yo male who comes to [facility] on 7/16 c noted mass in neck x1 M, noted to have mets to neck from prior head/neck CA. Pt has been progressively weak at home with multiple recent falls. Pt also sustained a fall while admitted 1DA. Back ground info is limited at this time as pt is not a good historian and defers many questions to his daughter who not available at time of eval.   Clinical Impression  Pt admitted with above diagnosis. Pt currently with functional limitations due to the deficits listed below (see "PT Problem List"). Upon entry, pt in bed just finished AMB to BR with NA, no family/caregiver present. The pt is awake and agreeable to participate conditionally, as he is in severe pain in neck. He is a poor historian.  Functional mobility assessment demonstrates increased effort/time requirements, poor tolerance, and need for physical assistance, whereas the patient performed these at a higher level of independence PTA. Pt does tolerate short distance in room with IV pole, but is quite weak and unsteady at times per nursing. MMT of BUE/BLE revealing of significant strength deficits globally that will no doubt contribute to difficulty performing basic mobility at DC. Pt will benefit from skilled PT intervention to increase independence and safety with basic mobility in preparation for discharge to the venue listed below.       Follow Up Recommendations Home health PT;Supervision/Assistance - 24 hour(unclear DC location at this time.)    Equipment Recommendations  Rolling walker with 5" wheels    Recommendations for Other Services       Precautions / Restrictions Precautions Precautions: Fall Restrictions Weight Bearing Restrictions: No      Mobility  Bed Mobility Overal bed mobility: Needs Assistance Bed Mobility:  Supine to Sit;Sit to Supine     Supine to sit: Supervision Sit to supine: Supervision   General bed mobility comments: mobilization undersupervision d/t recent fall while admitted. no physical assistance needed.   Transfers Overall transfer level: Needs assistance(AMB with NA prior to entry for toiletting ) Equipment used: None Transfers: Sit to/from Stand Sit to Stand: Min guard         General transfer comment: mobilization under supervision d/t recent fall while admitted. no physical assistance needed.   Ambulation/Gait Ambulation/Gait assistance: Min guard Gait Distance (Feet): 15 Feet(to BR and back to bedside with NA supervising. ) Assistive device: IV Pole          Stairs            Wheelchair Mobility    Modified Rankin (Stroke Patients Only)       Balance Overall balance assessment: Needs assistance;History of Falls;Mild deficits observed, not formally tested(Pt reports multiple falls prior to admission; one fall while admitted. )                                           Pertinent Vitals/Pain Pain Assessment: 0-10 Pain Score: 9  Pain Location: Neck  Pain Intervention(s): Limited activity within patient's tolerance;Monitored during session;Premedicated before session    Home Living Family/patient expects to be discharged to:: Private residence Living Arrangements: Children(daughte)               Additional Comments: Limited info at this time  d/t memory deficits; pt reports he does not have a home; EMR shows pt struggling with homelessness 1YA at PT evalaution while admitted.     Prior Function                 Hand Dominance        Extremity/Trunk Assessment   Upper Extremity Assessment Upper Extremity Assessment: Generalized weakness(elbows adn grip grossly 4/5 bilat. )    Lower Extremity Assessment Lower Extremity Assessment: Generalized weakness(BLE grossly 4-/5 in knees/hips, ankles grossly 3-/5; Pt is  ambulatory with IV pole. )       Communication      Cognition Arousal/Alertness: (drowsy) Behavior During Therapy: WFL for tasks assessed/performed Overall Cognitive Status: No family/caregiver present to determine baseline cognitive functioning                                 General Comments: Pt reports he has difficulty with thesee questions typically, and usually defers to daughter.       General Comments      Exercises     Assessment/Plan    PT Assessment Patient needs continued PT services  PT Problem List Decreased strength;Decreased activity tolerance;Decreased balance;Decreased mobility;Pain       PT Treatment Interventions DME instruction;Balance training;Gait training;Functional mobility training;Therapeutic activities;Therapeutic exercise;Patient/family education;Stair training    PT Goals (Current goals can be found in the Care Plan section)  Acute Rehab PT Goals Patient Stated Goal: decrease neck pain PT Goal Formulation: With patient Time For Goal Achievement: 03/09/18 Potential to Achieve Goals: Fair    Frequency Min 2X/week   Barriers to discharge (D/C location/planning are complicated by history homeless)      Co-evaluation               AM-PAC PT "6 Clicks" Daily Activity  Outcome Measure Difficulty turning over in bed (including adjusting bedclothes, sheets and blankets)?: A Lot Difficulty moving from lying on back to sitting on the side of the bed? : A Lot Difficulty sitting down on and standing up from a chair with arms (e.g., wheelchair, bedside commode, etc,.)?: A Lot Help needed moving to and from a bed to chair (including a wheelchair)?: A Little Help needed walking in hospital room?: A Little Help needed climbing 3-5 steps with a railing? : A Lot 6 Click Score: 14    End of Session   Activity Tolerance: Patient limited by fatigue;Patient limited by pain Patient left: in bed;with bed alarm set Nurse Communication:  Mobility status PT Visit Diagnosis: Unsteadiness on feet (R26.81);Other abnormalities of gait and mobility (R26.89);Difficulty in walking, not elsewhere classified (R26.2);Muscle weakness (generalized) (M62.81)    Time: 6440-3474 PT Time Calculation (min) (ACUTE ONLY): 14 min   Charges:   PT Evaluation $PT Eval High Complexity: 1 High     PT G Codes:        9:17 AM, 03/19/2018 Etta Grandchild, PT, DPT Physical Therapist - [facility]  [phone] ([place])    Navjot Loera C 2018-03-19, 9:15 AM

## 2018-02-23 NOTE — Progress Notes (Signed)
[place] [facility] [place] NAME: Martez Weiand    MR#:  [id number]  DATE OF BIRTH:  [birth year]  SUBJECTIVE:  CHIEF COMPLAINT:   Chief Complaint  Patient presents with  . Neck Pain  Patient seen today Has generalized weakness And failed modified barium swallow study No fever No chest pain  REVIEW OF SYSTEMS:    ROS  CONSTITUTIONAL: No documented fever. Has fatigue, weakness. No weight gain, has weight loss.  EYES: No blurry or double vision.  ENT: No tinnitus. No postnasal drip. No redness of the oropharynx.  Has difficulty swallowing food RESPIRATORY: No cough, no wheeze, no hemoptysis. No dyspnea.  CARDIOVASCULAR: No chest pain. No orthopnea. No palpitations. No syncope.  GASTROINTESTINAL: No nausea, no vomiting or diarrhea. No abdominal pain. No melena or hematochezia.  GENITOURINARY: No dysuria or hematuria.  ENDOCRINE: No polyuria or nocturia. No heat or cold intolerance.  HEMATOLOGY: No anemia. No bruising. No bleeding.  INTEGUMENTARY: No rashes. No lesions.  MUSCULOSKELETAL: No arthritis. No swelling. No gout.  NEUROLOGIC: No numbness, tingling, or ataxia. No seizure-type activity.  PSYCHIATRIC: No anxiety. No insomnia. No ADD.   DRUG ALLERGIES:  No Known Allergies  VITALS:  Blood pressure 113/80, pulse (!) 104, temperature 97.6 F (36.4 C), temperature source Oral, resp. rate 20, height 5\' 7"  (1.702 m), weight 51.3 kg (113 lb 1.5 oz), SpO2 100 %.  PHYSICAL EXAMINATION:   Physical Exam  GENERAL:  67 y.o.-year-old cachectic male patient lying in the bed with no acute distress.  EYES: Pupils equal, round, reactive to light and accommodation. No scleral icterus. Extraocular muscles intact.  HEENT: Head atraumatic, normocephalic. Oropharynx and nasopharynx clear.  Cervical lymphadenopathy NECK:  Supple, no jugular venous distention. No thyroid enlargement, no tenderness.  LUNGS: Normal breath sounds bilaterally, no wheezing,  rales, rhonchi. No use of accessory muscles of respiration.  CARDIOVASCULAR: S1, S2 normal. No murmurs, rubs, or gallops.  ABDOMEN: Soft, nontender, nondistended. Bowel sounds present. No organomegaly or mass.  EXTREMITIES: No cyanosis, clubbing or edema b/l.    NEUROLOGIC: Cranial nerves II through XII are intact. No focal Motor or sensory deficits b/l.   PSYCHIATRIC: The patient is alert and oriented x 3.  SKIN: No obvious rash, lesion, or ulcer.   LABORATORY PANEL:   CBC Recent Labs  Lab 02/23/18 0632  WBC 6.8  HGB 10.2*  HCT 30.0*  PLT 272   ------------------------------------------------------------------------------------------------------------------ Chemistries  Recent Labs  Lab 02/21/18 0433 02/22/18 0457  NA 136 139  K 3.7 4.3  CL 102 106  CO2 29 28  GLUCOSE 111* 120*  BUN 18 17  CREATININE 1.20 1.05  CALCIUM 8.7* 8.5*  MG 2.0  --    ------------------------------------------------------------------------------------------------------------------  Cardiac Enzymes No results for input(s): TROPONINI in the last 168 hours. ------------------------------------------------------------------------------------------------------------------  RADIOLOGY:  Dg Hip Unilat With Pelvis 2-3 Views Left  Result Date: 02/21/2018 CLINICAL DATA:  Fall. EXAM: DG HIP (WITH OR WITHOUT PELVIS) 2-3V LEFT COMPARISON:  No prior. FINDINGS: Diffuse left hip degenerative change. Diffuse osteopenia. No acute bony or joint abnormality. No evidence of fracture dislocation. Peripheral vascular calcification. IMPRESSION: 1. Diffuse degenerative change in osteopenia. No acute abnormality. 2.  Peripheral vascular disease. Electronically Signed   By: Marcello Moores  Register   On: 02/21/2018 14:47   Dg Hip Unilat With Pelvis 2-3 Views Right  Result Date: 02/21/2018 CLINICAL DATA:  Fall from bed EXAM: DG HIP (WITH OR WITHOUT PELVIS) 2-3V RIGHT COMPARISON:  None. FINDINGS: Early degenerative  changes with  early spurring bilaterally. Joint spaces maintained. SI joints are symmetric and unremarkable. No acute bony abnormality. Specifically, no fracture, subluxation, or dislocation. Vascular calcifications. IMPRESSION: No acute bony abnormality. Electronically Signed   By: Rolm Baptise M.D.   On: 02/21/2018 14:47     ASSESSMENT AND PLAN:  67 year old male patient with history of head and neck cancer bipolar disorder, COPD, chronic kidney disease stage III currently under hospitalist service for severe malnutrition, cervical lymphadenopathy and low sodium level  -Dysphagia Discussed with gastroenterology who recommended interventional radiology consultation for PEG tube placement Patient failed modified barium swallow study Currently n.p.o.  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 Appreciate oncology follow-up  -Head and neck cancer metastatic Appreciate oncology follow-up  -Hyponatremia improved Tonic SIADH Off Salt tablets  -Accidental fall Pelvic x-ray no evidence of fracture Physical therapy evaluation for endurance training  -Severe malnutrition Nutritional supplements Ensure to continue  -COPD stable  -Tobacco cessation counseled to the patient for six minutes Nicotine patch offered  -Alcohol abuse On alcohol cessation counseling given On thiamine supplements  -Physical therapy for endurance training   All the records are reviewed and case discussed with Care Management/Social Worker. Management plans discussed with the patient, family and they are in agreement.  CODE STATUS: Full code  DVT Prophylaxis: SCDs  TOTAL TIME TAKING CARE OF THIS PATIENT: 33 minutes.   POSSIBLE D/C IN 2 to 3 DAYS, DEPENDING ON CLINICAL CONDITION.  Saundra Shelling M.D on 02/23/2018 at 10:52 AM  Between 7am to 6pm - Pager - [phone]  After  6pm go to [email or web address] - password [email or web address] [facility]  Office  [phone]  CC: Primary care physician; [facility]  Note: This dictation was prepared with Dragon dictation along with smaller phrase technology. Any transcriptional errors that result from this process are unintentional.

## 2018-02-23 NOTE — Evaluation (Signed)
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 Pt has been progressively weak at home with multiple recent falls. Pt also sustained a fall while admitted 1DA. Back ground info is limited at this time as pt is not a good historian and defers many questions to his daughter who not available at time of eval.    Clinical Impression   Pt seen for OT evaluation this date. PLOF difficult to gather d/t cognition and speech challenges. Per PT note from 1YA, pt was independent.  Pt does endorse prior homelessness, but appears to expect to d/c to his daughter's house to whom he defers prior level and home setup questions.  Currently pt demonstrates impairments in self care, strength, and tolerance requiring MOD A for LB ADLs and SUP-HHA for t/f's mobility for fall prevention in the setting of falls on this hospitalization.  Pt would benefit from skilled OT to address noted impairments and functional limitations (see below for any additional details) in order to maximize safety and independence while minimizing falls risk and caregiver burden. Discharge dispo complicated by limited knowledge of available help and d/c location. Follow up therapy appropriate to ensure smooth transition regardless d/t pt risk of falling.     Follow Up Recommendations  Other (comment)(pt d/c dispo complicated by hx homelessness, current functional level, and anticipated needs, pt will likely need 24/7 supervision regardless of d/c location for fall prevention)    Equipment Recommendations  Tub/shower seat;Other (comment)(grab bars )    Recommendations for Other Services       Precautions / Restrictions Precautions Precautions: Fall Restrictions Weight Bearing Restrictions: No       Mobility Bed Mobility Overal bed mobility: Needs Assistance(use of bedrails to scoot) Bed Mobility: Supine to Sit;Sit to Supine     Supine to sit: Supervision Sit to supine: Supervision   General bed mobility comments: mobilization undersupervision d/t fall while on this admission  Transfers Overall transfer level: Needs assistance Equipment used: None Transfers: Sit to/from Stand Sit to Stand: Min guard         General transfer comment: HHA from EOB d/t fall on this admission    Balance Overall balance assessment: Needs assistance;History of Falls;Mild deficits observed, not formally tested   Sitting balance-Leahy Scale: Good       Standing balance-Leahy Scale: Fair                             ADL either performed or assessed with clinical judgement   ADL Overall ADL's : Needs assistance/impaired                 Upper Body Dressing : Minimal assistance   Lower Body Dressing: Moderate assistance Lower Body Dressing Details (indicate cue type and reason): Pt able to pull sock up after OT threaded and donned while at bed level             Functional mobility during ADLs: Min guard General ADL Comments: HHA to side step at EOB to optimize positioning     Vision Patient Visual Report: No change from baseline Additional Comments: difficult to formally assess as pt lethargic with limited eye opening, he reports no difference  Perception     Praxis      Pertinent Vitals/Pain Pain Assessment: 0-10 Pain Score: 9  Pain Location: Neck  Pain Descriptors / Indicators: Aching Pain Intervention(s): Limited activity within patient's tolerance;Monitored during session;Repositioned     Hand Dominance     Extremity/Trunk Assessment Upper Extremity Assessment Upper Extremity Assessment: Generalized weakness;RUE deficits/detail;LUE deficits/detail RUE Deficits / Details: 3/5, cannot accept challenge, grips 3+/5 LUE Deficits / Details: 3/5,  cannot accept challenge, grips 3+/5   Lower Extremity Assessment Lower Extremity Assessment: Defer to PT evaluation       Communication Communication Communication: Other (comment)(severely garbled speech)   Cognition Arousal/Alertness: Lethargic Behavior During Therapy: WFL for tasks assessed/performed Overall Cognitive Status: No family/caregiver present to determine baseline cognitive functioning                                 General Comments: Pt able to recall his name, not oriented to place or time, knows he is in hospital with moderate prompting/process of elimination cueing   General Comments       Exercises Other Exercises Other Exercises: Pt educated on safety concerns, fall prevention, use of call light, minimal carryover understanding detected.  Other Exercises: Pt educated on role of OT/POC, minimal carryover understanding detected.    Shoulder Instructions      Home Living Family/patient expects to be discharged to:: Private residence(pt is questionable historian, but deferes d/c questions to his daughter who is not present at time of eval) Living Arrangements: Children                               Additional Comments: Pt is questionable historian, he reports he does not have a home; EMR shows pt struggling with homelessness 1YA on PT evalaution while admitted. No other equipment or setup information able to be attained as he defers d/c planning Q's to his daughter.       Prior Functioning/Environment          Comments: Pt unable to provide PLOF info, PT eval from 1YA reports pt was independent, but "not getting around much" per his verbalization        OT Problem List: Decreased strength;Decreased range of motion;Decreased activity tolerance;Impaired balance (sitting and/or standing);Decreased cognition;Decreased safety awareness;Decreased knowledge of use of DME or AE;Decreased knowledge of precautions      OT  Treatment/Interventions: Self-care/ADL training;Therapeutic exercise;Neuromuscular education;DME and/or AE instruction;Therapeutic activities;Patient/family education;Balance training    OT Goals(Current goals can be found in the care plan section) Acute Rehab OT Goals Patient Stated Goal: decrease neck pain OT Goal Formulation: With patient Time For Goal Achievement: 03/09/18 Potential to Achieve Goals: Fair ADL Goals Pt Will Transfer to Toilet: with min guard assist(with AD PRN) Additional ADL Goal #1: Will stand sink-side with CGA x 2-3 mins to perform one g/h task to improve standing tolerance for functional task participation.  OT Frequency: Min 1X/week   Barriers to D/C:    d/c dispo complicated by hx homelessness and unknown PLOF, equipment, etc.        Co-evaluation              AM-PAC PT "6 Clicks" Daily Activity     Outcome Measure Help from another person eating meals?: None Help from another person taking care of personal grooming?: None Help from another person toileting, which includes using toliet, bedpan, or urinal?:  A Little Help from another person bathing (including washing, rinsing, drying)?: A Little Help from another person to put on and taking off regular upper body clothing?: A Little Help from another person to put on and taking off regular lower body clothing?: A Lot 6 Click Score: 19   End of Session Nurse Communication: Mobility status  Activity Tolerance: Patient tolerated treatment well Patient left: in bed;with call bell/phone within reach;with bed alarm set  OT Visit Diagnosis: Unsteadiness on feet (R26.81);Repeated falls (R29.6);Muscle weakness (generalized) (M62.81)                Time: 7121-9758 OT Time Calculation (min): 17 min Charges:  OT General Charges $OT Visit: 1 Visit OT Evaluation $OT Eval Moderate Complexity: 1 Mod OT Treatments $Self Care/Home Management : 8-22 mins Gerrianne Scale, MS, OTR/L ascom [phone] or  [phone] 02/23/18, 10:45 AM

## 2018-02-24 LAB — CULTURE, BLOOD (ROUTINE X 2)
CULTURE: NO GROWTH
Culture: NO GROWTH
Special Requests: ADEQUATE
Special Requests: ADEQUATE

## 2018-02-24 MED ORDER — FENTANYL 12 MCG/HR TD PT72
12.5000 ug | MEDICATED_PATCH | TRANSDERMAL | Status: DC
  Administered 2018-02-24 – 2018-03-05 (×4): 12.5 ug via TRANSDERMAL
  Filled 2018-02-24 (×4): qty 1

## 2018-02-24 MED ORDER — LORAZEPAM 1 MG PO TABS: 1.0000 mg | ORAL_TABLET | Freq: Four times a day (QID) | ORAL | Status: AC | PRN

## 2018-02-24 MED ORDER — LEVOTHYROXINE SODIUM 50 MCG PO TABS
50.0000 ug | ORAL_TABLET | Freq: Every day | ORAL | Status: DC
  Filled 2018-02-24 (×2): qty 1

## 2018-02-24 MED ORDER — LORAZEPAM 2 MG/ML IJ SOLN
1.0000 mg | Freq: Four times a day (QID) | INTRAMUSCULAR | Status: AC | PRN
  Administered 2018-02-24: 1 mg via INTRAVENOUS
  Filled 2018-02-24: qty 1

## 2018-02-24 NOTE — Progress Notes (Signed)
[facility] NAME: Thomas [name]    MR#:  [id number]  DATE OF BIRTH:  [birth year]  SUBJECTIVE:  CHIEF COMPLAINT:   Chief Complaint  Patient presents with  . Neck Pain   Patient seen today Has generalized weakness And failed modified barium swallow study No fever No chest pain  REVIEW OF SYSTEMS:    ROS  CONSTITUTIONAL: No documented fever. Has fatigue, weakness. No weight gain, has weight loss.  EYES: No blurry or double vision.  ENT: No tinnitus. No postnasal drip. No redness of the oropharynx.  Has difficulty swallowing food RESPIRATORY: No cough, no wheeze, no hemoptysis. No dyspnea.  CARDIOVASCULAR: No chest pain. No orthopnea. No palpitations. No syncope.  GASTROINTESTINAL: No nausea, no vomiting or diarrhea. No abdominal pain. No melena or hematochezia.  GENITOURINARY: No dysuria or hematuria.  ENDOCRINE: No polyuria or nocturia. No heat or cold intolerance.  HEMATOLOGY: No anemia. No bruising. No bleeding.  INTEGUMENTARY: No rashes. No lesions.  MUSCULOSKELETAL: No arthritis. No swelling. No gout.  NEUROLOGIC: No numbness, tingling, or ataxia. No seizure-type activity.  PSYCHIATRIC: No anxiety. No insomnia. No ADD.   DRUG ALLERGIES:  No Known Allergies  VITALS:  Blood pressure (!) 145/74, pulse (!) 111, temperature 98.8 F (37.1 C), temperature source Oral, resp. rate 20, height 5\' 7"  (1.702 m), weight 51.3 kg (113 lb 1.5 oz), SpO2 (!) 89 %.  PHYSICAL EXAMINATION:   Physical Exam  GENERAL:  68 y.o.-year-old cachectic male patient lying in the bed with no acute distress.  EYES: Pupils equal, round, reactive to light and accommodation. No scleral icterus. Extraocular muscles intact.  HEENT: Head atraumatic, normocephalic. Oropharynx and nasopharynx clear.  Cervical lymphadenopathy NECK:  Supple, no jugular venous distention. No thyroid enlargement, no tenderness.  LUNGS: Normal breath sounds bilaterally, no  wheezing, rales, rhonchi. No use of accessory muscles of respiration.  CARDIOVASCULAR: S1, S2 normal. No murmurs, rubs, or gallops.  ABDOMEN: Soft, nontender, nondistended. Bowel sounds present. No organomegaly or mass.  EXTREMITIES: No cyanosis, clubbing or edema b/l.    NEUROLOGIC: Cranial nerves II through XII are intact. No focal Motor or sensory deficits b/l.  Generalized weakness. PSYCHIATRIC: The patient is alert and oriented x 3.  SKIN: No obvious rash, lesion, or ulcer.   LABORATORY PANEL:   CBC Recent Labs  Lab 02/23/18 0632  WBC 6.8  HGB 10.2*  HCT 30.0*  PLT 272   ------------------------------------------------------------------------------------------------------------------ Chemistries  Recent Labs  Lab 02/21/18 0433 02/22/18 0457  NA 136 139  K 3.7 4.3  CL 102 106  CO2 29 28  GLUCOSE 111* 120*  BUN 18 17  CREATININE 1.20 1.05  CALCIUM 8.7* 8.5*  MG 2.0  --    ------------------------------------------------------------------------------------------------------------------  Cardiac Enzymes No results for input(s): TROPONINI in the last 168 hours. ------------------------------------------------------------------------------------------------------------------  RADIOLOGY:  No results found.   ASSESSMENT AND PLAN:  67 year old male patient with history of head and neck cancer bipolar disorder, COPD, chronic kidney disease stage III currently under hospitalist service for severe malnutrition, cervical lymphadenopathy and low sodium level  -Dysphagia Discussed with gastroenterology who recommended interventional radiology consultation for PEG tube placement Patient failed modified barium swallow study Currently n.p.o.  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  -  Hyponatremia improved Tonic SIADH Off Salt tablets  -Accidental fall Pelvic x-ray no evidence of fracture Physical therapy evaluation for endurance training  -Severe malnutrition Nutritional supplements Ensure to continue  -COPD stable  -Tobacco cessation counseled to the patient for six minutes Nicotine patch offered  -Alcohol abuse On alcohol cessation counseling given On thiamine supplements  -Physical therapy for endurance training   All the records are reviewed and case discussed with Care Management/Social Worker. Management plans discussed with the patient, family and they are in agreement.  CODE STATUS: Full code  DVT Prophylaxis: SCDs  TOTAL TIME TAKING CARE OF THIS PATIENT: 33 minutes.   POSSIBLE D/C IN 2 to 3 DAYS, DEPENDING ON CLINICAL CONDITION.  Vaughan Basta M.D on 02/24/2018 at 4:52 PM  Between 7am to 6pm - Pager - [phone]  After 6pm go to [email or web address] - password [email or web address] [facility]  Office  [phone]  CC: Primary care physician; [facility]  Note: This dictation was prepared with Dragon dictation along with smaller phrase technology. Any transcriptional errors that result from this process are unintentional.

## 2018-02-25 ENCOUNTER — Inpatient Hospital Stay: Payer: Medicare Other

## 2018-02-25 ENCOUNTER — Encounter: Payer: Self-pay | Admitting: Interventional Radiology

## 2018-02-25 DIAGNOSIS — R1310 Dysphagia, unspecified: Secondary | ICD-10-CM

## 2018-02-25 DIAGNOSIS — R1312 Dysphagia, oropharyngeal phase: Secondary | ICD-10-CM

## 2018-02-25 HISTORY — PX: IR FLUORO RM 30-60 MIN: IMG2384

## 2018-02-25 LAB — URINALYSIS, COMPLETE (UACMP) WITH MICROSCOPIC
BILIRUBIN URINE: NEGATIVE
Bacteria, UA: NONE SEEN
GLUCOSE, UA: NEGATIVE mg/dL
HGB URINE DIPSTICK: NEGATIVE
KETONES UR: 20 mg/dL — AB
LEUKOCYTES UA: NEGATIVE
Nitrite: NEGATIVE
PROTEIN: NEGATIVE mg/dL
SQUAMOUS EPITHELIAL / LPF: NONE SEEN (ref 0–5)
Specific Gravity, Urine: 1.012 (ref 1.005–1.030)
pH: 5 (ref 5.0–8.0)

## 2018-02-25 LAB — BASIC METABOLIC PANEL
ANION GAP: 5 (ref 5–15)
BUN: 12 mg/dL (ref 8–23)
CO2: 30 mmol/L (ref 22–32)
Calcium: 8.1 mg/dL — ABNORMAL LOW (ref 8.9–10.3)
Chloride: 100 mmol/L (ref 98–111)
Creatinine, Ser: 1.03 mg/dL (ref 0.61–1.24)
GFR calc Af Amer: >60 mL/min (ref >60)
GFR calc non Af Amer: >60 mL/min (ref >60)
GLUCOSE: 94 mg/dL (ref 70–99)
POTASSIUM: 4.4 mmol/L (ref 3.5–5.1)
Sodium: 135 mmol/L (ref 135–145)

## 2018-02-25 LAB — CBC
HCT: 29.8 % — ABNORMAL LOW (ref 40.0–52.0)
Hemoglobin: 9.9 g/dL — ABNORMAL LOW (ref 13.0–18.0)
MCH: 28.8 pg (ref 26.0–34.0)
MCHC: 33.1 g/dL (ref 32.0–36.0)
MCV: 87 fL (ref 80.0–100.0)
Platelets: 277 10*3/uL (ref 150–440)
RBC: 3.42 MIL/uL — ABNORMAL LOW (ref 4.40–5.90)
RDW: 15.1 % — ABNORMAL HIGH (ref 11.5–14.5)
WBC: 11.7 10*3/uL — AB (ref 3.8–10.6)

## 2018-02-25 MED ORDER — GLUCAGON HCL RDNA (DIAGNOSTIC) 1 MG IJ SOLR
INTRAMUSCULAR | Status: AC
  Filled 2018-02-25: qty 1

## 2018-02-25 MED ORDER — MORPHINE SULFATE (PF) 2 MG/ML IV SOLN
2.0000 mg | INTRAVENOUS | Status: DC | PRN
  Administered 2018-02-26 – 2018-02-28 (×9): 2 mg via INTRAVENOUS
  Filled 2018-02-25 (×9): qty 1

## 2018-02-25 MED ORDER — GLUCAGON HCL RDNA (DIAGNOSTIC) 1 MG IJ SOLR
1.0000 mg | Freq: Once | INTRAMUSCULAR | Status: AC | PRN
  Administered 2018-02-25: 16:00:00 1 mg via INTRAVENOUS

## 2018-02-25 MED ORDER — SODIUM CHLORIDE 0.9 % IV SOLN
1.5000 g | Freq: Four times a day (QID) | INTRAVENOUS | Status: DC
  Administered 2018-02-25 – 2018-03-01 (×15): 1.5 g via INTRAVENOUS
  Filled 2018-02-25 (×19): qty 1.5

## 2018-02-25 NOTE — Progress Notes (Signed)
OT Cancellation Note  Patient Details Name: Thomas [name] MRN: [id number] DOB: [birth year]   Cancelled Treatment:    Reason Eval/Treat Not Completed: Patient at procedure or test/ unavailable. Upon attempt to treat, pt with transport staff for procedure. Will re-attempt at later date/time as pt is available and medically appropriate.   Jeni Salles, MPH, MS, OTR/L ascom [phone] 02/25/18, 2:01 PM

## 2018-02-25 NOTE — Progress Notes (Signed)
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 Pt has been progressively weak at home with multiple recent falls. Pt also sustained a fall while admitted 1DA. Back ground info is limited at this time as pt is not a good historian and defers many questions to his daughter who not available at time of eval.     PT Comments    Pt agreeable to limited PT for ambulation to/form bathroom from bed. Pt reports 9/10 pain in neck and feels weak/exhausted, as he cannot eat/drink. Pt does not wish further exercise/mobility or sitting up in chair. Pt demonstrates Mod I for STS transfers with use of rail and Min guard for ambulation due to mild unsteady gait. Continue PT to progress strength, endurance as tolerated to improve functional mobility and safety.    Follow Up Recommendations  Home health PT;Supervision/Assistance - 24 hour     Equipment Recommendations  Rolling walker with 5" wheels    Recommendations for Other Services       Precautions / Restrictions Precautions Precautions: Fall Restrictions Weight Bearing Restrictions: No    Mobility  Bed Mobility Overal bed mobility: Modified Independent             General bed mobility comments: use of rail only with 1 UE  Transfers Overall transfer level: Needs assistance Equipment used: None Transfers: Sit to/from Stand Sit to Stand: Supervision         General transfer comment: use of rail at bed and commode for transfers.   Ambulation/Gait Ambulation/Gait assistance: Min guard Gait Distance (Feet): 25 Feet(performed 2x to/from bathroom) Assistive device: None(does hold IV pole)       General Gait Details: Slowed pace, wide BOS, decreased stride   Stairs             Wheelchair  Mobility    Modified Rankin (Stroke Patients Only)       Balance Overall balance assessment: Needs assistance;History of Falls;Mild deficits observed, not formally tested         Standing balance support: Single extremity supported Standing balance-Leahy Scale: Fair                              Cognition Arousal/Alertness: Awake/alert Behavior During Therapy: WFL for tasks assessed/performed Overall Cognitive Status: Within Functional Limits for tasks assessed                                 General Comments: Mildly difficult to understand  due to difficulty enunciating words      Exercises      General Comments        Pertinent Vitals/Pain Pain Assessment: 0-10    Home Living                      Prior Function            PT Goals (current goals can now be found in the care plan section) Progress towards PT goals: Progressing toward goals    Frequency    Min 2X/week      PT Plan Current plan remains appropriate    Co-evaluation  AM-PAC PT "6 Clicks" Daily Activity  Outcome Measure  Difficulty turning over in bed (including adjusting bedclothes, sheets and blankets)?: None Difficulty moving from lying on back to sitting on the side of the bed? : A Little Difficulty sitting down on and standing up from a chair with arms (e.g., wheelchair, bedside commode, etc,.)?: A Little Help needed moving to and from a bed to chair (including a wheelchair)?: A Little Help needed walking in hospital room?: A Little Help needed climbing 3-5 steps with a railing? : A Lot 6 Click Score: 18    End of Session Equipment Utilized During Treatment: Gait belt Activity Tolerance: Patient limited by pain;Patient limited by fatigue Patient left: in bed;with call bell/phone within reach;with bed alarm set   PT Visit Diagnosis: Unsteadiness on feet (R26.81);Other abnormalities of gait and mobility (R26.89);Difficulty in  walking, not elsewhere classified (R26.2);Muscle weakness (generalized) (M62.81)     Time: 5638-7564 PT Time Calculation (min) (ACUTE ONLY): 19 min  Charges:  $Gait Training: 8-22 mins                    G Codes:        Larae Grooms, PTA 02/25/2018, 12:00 PM
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
## 2018-02-25 NOTE — Clinical Social Work Note (Signed)
CSW called patient's daughter Langley Gauss [phone] to give bed offers. Daughter chose bed at [facility].  CSW notified Neoma Laming at [facility] of bed acceptance. CSW will continue to follow for discharge planning.   [place], [place]
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
## 2018-02-25 NOTE — Procedures (Signed)
Pre procedural Dx: Dysphagia Post procedural Dx: Same  History of recurrent head and neck cancer, now with dysphagia and concern for chronic aspiration.  Request made for placement of an image guided gastrostomy tube for enteric nutrition supplementation purposes.   Note, preceding noncontrast abdominal CT performed earlier today demonstrated a poor percutaneous window for gastrostomy tube placement however patient will be evaluated fluoroscopically following the insufflation of the stomach with air.   No safe percutaneous window to allow for percutaneous gastrostomy tube placement despite insufflation of the stomach with air.  EBL: None  Complications: None immediate  PLAN:  Recommend surgical consultation for gastrostomy tube placement.   Ronny Bacon, MD Pager #: [phone]

## 2018-02-25 NOTE — Care Management Important Message (Signed)
Important Message  Patient Details  Name: Thomas [name] MRN: [id number] Date of Birth: April 17, 1951   Medicare Important Message Given:  Yes    [name] 02/25/2018, 11:04 AM

## 2018-02-26 DIAGNOSIS — C760 Malignant neoplasm of head, face and neck: Secondary | ICD-10-CM

## 2018-02-26 LAB — CBC
HEMATOCRIT: 29.9 % — AB (ref 40.0–52.0)
HEMOGLOBIN: 10.3 g/dL — AB (ref 13.0–18.0)
MCH: 29.8 pg (ref 26.0–34.0)
MCHC: 34.3 g/dL (ref 32.0–36.0)
MCV: 86.9 fL (ref 80.0–100.0)
Platelets: 274 10*3/uL (ref 150–440)
RBC: 3.44 MIL/uL — ABNORMAL LOW (ref 4.40–5.90)
RDW: 15.6 % — ABNORMAL HIGH (ref 11.5–14.5)
WBC: 9.5 10*3/uL (ref 3.8–10.6)

## 2018-02-26 MED ORDER — LEVOTHYROXINE SODIUM 100 MCG IV SOLR
25.0000 ug | Freq: Every day | INTRAVENOUS | Status: DC
  Administered 2018-02-26 – 2018-03-01 (×4): 25 ug via INTRAVENOUS
  Filled 2018-02-26 (×5): qty 5

## 2018-02-26 MED ORDER — CLONIDINE HCL 0.1 MG/24HR TD PTWK
0.1000 mg | MEDICATED_PATCH | TRANSDERMAL | Status: DC
  Administered 2018-02-26 – 2018-03-05 (×2): 0.1 mg via TRANSDERMAL
  Filled 2018-02-26 (×2): qty 1

## 2018-02-26 NOTE — Progress Notes (Signed)
Verbal telephone consent obtained from patient's daughter Langley Gauss for patient to have peg tube placement tomorrow.  Second nurse verification by Sharyn Lull, RN.  Clarise Cruz, RN

## 2018-02-26 NOTE — Consult Note (Signed)
Subjective:   CC: gtube placement  HPI:  Thomas [name] is a 67 y.o. male consulted for g tube placement.  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  He has received chemoradiation in the past and also has a hx of gtube placement before, but was doing well until recently, where he has complained of progressive dysphagia, confirmed with failed barium swallow study.  IR and GI initially consulted for percutaneous gtube placement but due to overlying colon preventing a safe window to the stomach, surgery was consulted for placement.   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  Past Surgical History:  Past Surgical History:  Procedure Laterality Date  . cancerous lymph nodes removed from neck    . IR FLUORO RM 30-60 MIN  02/25/2018  . KNEE SURGERY Right   . SKIN BIOPSY    also includes radical neck dissection and nose surgery for CA  Family History: family history includes Hypertension in his mother.  Social History:  reports that he has been smoking cigarettes.  He has never used smokeless tobacco. He reports that he drinks alcohol. He reports that he does not use drugs.  Allergies:  Allergies as of 02/18/2018  . (No Known Allergies)    ROS:  A 15 point review of systems was performed and pertinent positives and negatives noted in HPI   Objective:     BP (!) 163/91   Pulse (!) 102   Temp 98.5 F (36.9 C) (Oral)   Resp 18   Ht 5\' 7"  (1.702 m)   Wt 51.3 kg (113 lb 1.5 oz) Comment: bed scale  SpO2 94%   BMI 17.71 kg/m   Constitutional :  alert, cooperative, appears older than stated age and no distress     Respiratory:  wheezes RLL  Cardiovascular:  regular rate and rhythm, S1, S2 normal, no murmur, click, rub or gallop  Gastrointestinal: soft, non-tender; bowel  sounds normal; no masses,  no organomegaly and former gtube site noted at subxyphoid region.  hernia noted.    Musculoskeletal: Steady gait and movement  Skin: Cool and moist,   Psychiatric: Normal affect, non-agitated, not confused       LABS:  Wbc, Na, WNL  RADS: none Assessment:   Dysphagia secondary to head and neck SCC, malnutrition requiring gastrostomy tube placement Plan:     Discussed laparoscopic, possible conversion to open gastrostomy tube placement.   The risk of surgery include, but not limited to, bleeding, chronic pain, post-op infxn, post-op SBO or ileus, gtube malfunction, injury to surrounding orgrans resulting in possible resection of bowel, re-anastamosis, possible ostomy placement and need for re-operation to address said risks. The risks of general anesthetic, includes MI, CVA, sudden death or even reaction to anesthetic medications also discussed. Alternatives include continued observation and nasal or oral tube feeding, TPN, if possible.  Benefits include nutrition supplementation and long term nutritional access.  Typical post-op recovery time of additional days in hospital for observation afterwards also discussed.  The patient verbalized understanding and all questions were answered to the patient's satisfaction, and he will like to proceed.  Will schedule for tomorrow 02/26/18 around 0930ish.  Keep NPO, hold heparin am dose.  Tried to reach daughter but no response.  Will try again later.

## 2018-02-26 NOTE — H&P (View-Only) (Signed)
Subjective:   CC: gtube placement  HPI:  Thomas [name] is a 67 y.o. male consulted for g tube placement.  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  He has received chemoradiation in the past and also has a hx of gtube placement before, but was doing well until recently, where he has complained of progressive dysphagia, confirmed with failed barium swallow study.  IR and GI initially consulted for percutaneous gtube placement but due to overlying colon preventing a safe window to the stomach, surgery was consulted for placement.   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  Past Surgical History:  Past Surgical History:  Procedure Laterality Date  . cancerous lymph nodes removed from neck    . IR FLUORO RM 30-60 MIN  02/25/2018  . KNEE SURGERY Right   . SKIN BIOPSY    also includes radical neck dissection and nose surgery for CA  Family History: family history includes Hypertension in his mother.  Social History:  reports that he has been smoking cigarettes.  He has never used smokeless tobacco. He reports that he drinks alcohol. He reports that he does not use drugs.  Allergies:  Allergies as of 02/18/2018  . (No Known Allergies)    ROS:  A 15 point review of systems was performed and pertinent positives and negatives noted in HPI   Objective:     BP (!) 163/91   Pulse (!) 102   Temp 98.5 F (36.9 C) (Oral)   Resp 18   Ht 5\' 7"  (1.702 m)   Wt 51.3 kg (113 lb 1.5 oz) Comment: bed scale  SpO2 94%   BMI 17.71 kg/m   Constitutional :  alert, cooperative, appears older than stated age and no distress     Respiratory:  wheezes RLL  Cardiovascular:  regular rate and rhythm, S1, S2 normal, no murmur, click, rub or gallop  Gastrointestinal: soft, non-tender; bowel  sounds normal; no masses,  no organomegaly and former gtube site noted at subxyphoid region.  hernia noted.    Musculoskeletal: Steady gait and movement  Skin: Cool and moist,   Psychiatric: Normal affect, non-agitated, not confused       LABS:  Wbc, Na, WNL  RADS: none Assessment:   Dysphagia secondary to head and neck SCC, malnutrition requiring gastrostomy tube placement Plan:     Discussed laparoscopic, possible conversion to open gastrostomy tube placement.   The risk of surgery include, but not limited to, bleeding, chronic pain, post-op infxn, post-op SBO or ileus, gtube malfunction, injury to surrounding orgrans resulting in possible resection of bowel, re-anastamosis, possible ostomy placement and need for re-operation to address said risks. The risks of general anesthetic, includes MI, CVA, sudden death or even reaction to anesthetic medications also discussed. Alternatives include continued observation and nasal or oral tube feeding, TPN, if possible.  Benefits include nutrition supplementation and long term nutritional access.  Typical post-op recovery time of additional days in hospital for observation afterwards also discussed.  The patient verbalized understanding and all questions were answered to the patient's satisfaction, and he will like to proceed.  Will schedule for tomorrow 02/26/18 around 0930ish.  Keep NPO, hold heparin am dose.  Tried to reach daughter but no response.  Will try again later.

## 2018-02-26 NOTE — Anesthesia Preprocedure Evaluation (Addendum)
Anesthesia Evaluation  Patient identified by MRN, date of birth, ID band Patient awake and Patient confused    Reviewed: Allergy & Precautions, H&P , NPO status , Patient's Chart, lab work & pertinent test results, reviewed documented beta blocker date and time   History of Anesthesia Complications Negative for: history of anesthetic complications  Airway Mallampati: II  TM Distance: >3 FB Neck ROM: limited    Dental  (+) Poor Dentition, Missing   Pulmonary neg shortness of breath, COPD, Current Smoker,    Pulmonary exam normal        Cardiovascular Exercise Tolerance: Good hypertension, On Medications (-) angina(-) DOE Normal cardiovascular exam(-) Valvular Problems/Murmurs Rhythm:regular Rate:Normal     Neuro/Psych PSYCHIATRIC DISORDERS negative neurological ROS     GI/Hepatic negative GI ROS, Neg liver ROS,   Endo/Other  negative endocrine ROS  Renal/GU Renal disease  negative genitourinary   Musculoskeletal   Abdominal   Peds  Hematology negative hematology ROS (+)   Anesthesia Other Findings Past Medical History: No date: Cancer of nasal cavities ([facility]) No date: Enlarged prostate No date: Hypertension No date: Lung infection     Comment:  no lung cancer No date: Radiation     Comment:  to neck No date: Renal disorder No date: Skin cancer No date: Status post chemotherapy No date: Tongue cancer ([facility]) Past Surgical History: No date: cancerous lymph nodes removed from neck 02/25/2018: IR FLUORO RM 30-60 MIN No date: KNEE SURGERY; Right No date: SKIN BIOPSY BMI    Body Mass Index:  17.71 kg/m     Reproductive/Obstetrics negative OB ROS                           Anesthesia Physical Anesthesia Plan  ASA: III  Anesthesia Plan: General ETT   Post-op Pain Management:    Induction: Intravenous  PONV Risk Score and Plan: Ondansetron, Dexamethasone and Midazolam  Airway  Management Planned: Oral ETT  Additional Equipment:   Intra-op Plan:   Post-operative Plan: Extubation in OR and Possible Post-op intubation/ventilation  Informed Consent: I have reviewed the patients History and Physical, chart, labs and discussed the procedure including the risks, benefits and alternatives for the proposed anesthesia with the patient or authorized representative who has indicated his/her understanding and acceptance.   Dental Advisory Given  Plan Discussed with: CRNA  Anesthesia Plan Comments: (Consent from daughter via phone  Patient and daughter consented for risks of anesthesia including but not limited to:  - adverse reactions to medications - damage to teeth, lips or other oral mucosa - sore throat or hoarseness - Damage to heart, brain, lungs or loss of life  They voiced understanding.)      Anesthesia Quick Evaluation
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
## 2018-02-26 NOTE — Progress Notes (Signed)
Nutrition Follow-up  DOCUMENTATION CODES:   Severe malnutrition in context of chronic illness, Underweight  INTERVENTION:  Plan is for surgical placement of G-tube in AM.  Once G-tube okay to use per Surgeon, recommend slow advancement to goal regimen due to risk of refeeding syndrome. Day 1: Initiate Osmolite 1.5 Cal 1/2 can (~120 mL) TID. Day 2: Advance to Osmolite 1.5 Cal 1/2 can (~120 mL) 5 times daily. Day 3: Advance to goal regimen of Osmolite 1.5 Cal 1 can (237 mL) 5 times daily.  Goal TF regimen provides 1775 kcal, 75 grams of protein, 905 mL H2O daily.  Provide free water flush of 90 mL before and after each bolus tube feeding (total of 180 mL 5 times daily). Total of 1805 mL H2O daily including water in tube feeding.  Once patient is tolerating goal TF regimen recommend discontinuing IV fluids.  Monitor magnesium, potassium, and phosphorus daily until patient is at goal TF regimen and electrolytes are stable, MD to replete as needed, as pt is at risk for refeeding syndrome given severe malnutrition.  NUTRITION DIAGNOSIS:   Severe Malnutrition related to chronic illness(COPD, CKD stage III, EtOH abuse, hx H&N cancer, cervical lymphadenopathy concerning for recurrence/metastasis) as evidenced by severe fat depletion, severe muscle depletion.  Ongoing.  GOAL:   Patient will meet greater than or equal to 90% of their needs  Not progressing at this time.  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  -Patient underwent MBSS on 7/19. Findings including deficits consistent with late effects of XRT on swallowing and are not likely to improve with time. Patient is at severe risk for aspiration with PO intake so recommendation was made for patient to remain NPO. -On 7/22 placement of G-tube by IR was attempted but could not be completed as there was no safe percutaneous window to allow for percutaneous placement.  -Per GI note on 7/22 it will also not be possible for GI to place tube endoscopically. -Patient was assessed by surgery today. Plan is for laparoscopic placement of G-tube tomorrow AM (possible open placement pending findings). -Per Oncology note it is recommended patient have outpatient PET. He will likely need immunotherapy +/- chemotherapy. -Patient is being worked-up for placement at [facility] after discharge.  Met with patient at bedside. No family members present at time of RD assessment. He denies any nausea or pain. He is concerned with how long it has been since he has been able to eat. Discussed that we are pending a plan to have access to be able to feed patient (that was prior to surgery consult).   Medications reviewed and include: fentanyl patch, folic acid 1 mg daily (unable to take as pt NPO), levothyroxine IV, Megace (unable to take as pt NPO), MVI (unable to take as pt NPO), senna-docusate (unable to take as pt NPO), thiamine 100 mg IV daily, NS @ 50 mL/hr, Unasyn.  Labs reviewed. On 7/22  Potassium WNL. Phosphorus and Magnesium were WNL on 7/18.  I/O: urine output unmeasured  No weight to trend since initial assessment.  Discussed with RN.  Diet Order:   Diet Order           Diet NPO time specified  Diet effective now          EDUCATION NEEDS:   Not appropriate for education at this time  Skin:  Skin Assessment:  Reviewed RN Assessment(ecchymosis to bilateral arms)  Last BM:  PTA (02/18/2018 per chart)  Height:   Ht Readings from Last 1 Encounters:  02/19/18 5' 7" (1.702 m)    Weight:   Wt Readings from Last 1 Encounters:  02/20/18 113 lb 1.5 oz (51.3 kg)    Ideal Body Weight:  67.3 kg  BMI:  Body mass index is 17.71 kg/m.  Estimated Nutritional Needs:   Kcal:  1630-1880 (MSJ x 1.3-1.5)  Protein:  75-90 grams (1.5-1.8 grams/kg)  Fluid:  1.6-1.9 L/day (1 mL/kcal)  Willey Blade, MS, RD, LDN Office: [phone] Pager: [phone] After Hours/Weekend Pager: [phone]

## 2018-02-26 NOTE — Progress Notes (Signed)
Daily Progress Note   Patient Name: Thomas [name]       Date: 02/26/2018 DOB: 25-Jan-1951  Age: 67 y.o. MRN#: [id number] Attending Physician: Vaughan Basta, * Primary Care Physician: [facility] Admit Date: 02/19/2018  Reason for Consultation/Follow-up: Establishing goals of care  Subjective: Patient is sitting in bed. No family at bedside. He states his daughter has not been present in a few days, but she will show up, she always does. He states he is awaiting a feeding tube. He has been placed on an appetite stimulant, and states he is hungry and thirsty. He tells me having been through this before, he knows what to expect and there is no circumstance he could think of that would make him want to stop treatments. He would like to continue all care available. He wishes to remain a full code.    Length of Stay: 6  Current Medications: Scheduled Meds:  . cloNIDine  0.1 mg Transdermal Weekly  . DULoxetine  30 mg Oral Daily  . fentaNYL  12.5 mcg Transdermal Q72H  . fluticasone  1 spray Each Nare Daily  . folic acid  1 mg Oral Daily  . heparin  5,000 Units Subcutaneous Q8H  . levothyroxine  25 mcg Intravenous Daily  . megestrol  40 mg Oral BID  . mometasone-formoterol  2 puff Inhalation BID  . multivitamin with minerals  1 tablet Oral Daily  . nicotine  14 mg Transdermal Daily  . senna-docusate  2 tablet Oral Daily  . thiamine  100 mg Oral Daily   Or  . thiamine  100 mg Intravenous Daily    Continuous Infusions: . sodium chloride 50 mL/hr at 02/25/18 1408  . ampicillin-sulbactam (UNASYN) IV Stopped (02/26/18 1210)    PRN Meds: acetaminophen **OR** acetaminophen, bisacodyl, clonazePAM, ipratropium-albuterol, labetalol, morphine injection, ondansetron **OR**  ondansetron (ZOFRAN) IV, polyethylene glycol, traZODone  Physical Exam  Constitutional: No distress.  Pulmonary/Chest: Effort normal.  Neurological: He is alert.            Vital Signs: BP (!) 163/91   Pulse (!) 102   Temp 98.5 F (36.9 C) (Oral)   Resp 18   Ht 5\' 7"  (1.702 m)   Wt 51.3 kg (113 lb 1.5 oz) Comment: bed scale  SpO2 94%   BMI 17.71 kg/m  SpO2: SpO2: 94 % O2 Device: O2 Device: Room Air O2 Flow Rate: O2 Flow Rate (L/min): 2 L/min  Intake/output summary:   Intake/Output Summary (Last 24 hours) at 02/26/2018 1334 Last data filed at 02/26/2018 0359 Gross per 24 hour  Intake 932.5 ml  Output -  Net 932.5 ml   LBM: Last BM Date: 02/18/18(pt NPO ) Baseline Weight: Weight: 61.2 kg (135 lb) Most recent weight: Weight: 51.3 kg (113 lb 1.5 oz)(bed scale)       Palliative Assessment/Data: NPO currently for procedure.       Patient Active Problem List   Diagnosis Date Noted  . Dysphagia   . Neck mass   . Head and neck cancer ([place]) 02/19/2018  . Cervical lymphadenopathy   . Poor appetite   . Loss of weight   . History of tongue cancer   . Protein-calorie malnutrition, severe 01/22/2016  . Alcohol use disorder, severe, dependence ([place]) 12/12/2014  . Hyponatremia 12/12/2014  . Essential hypertension 12/12/2014  . COPD (chronic obstructive pulmonary disease) ([place]) 12/12/2014    Palliative Care Assessment & Plan   Patient Profile: Patient is a 67 y.o. 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 He presented for fatigue and weight loss. He presented to the ED for left side neck swelling times approximately 1 month accompanied by weight loss lethargy very poor p.o. intake loss of appetite.  Recurrent squamous cell carcinoma/head and neck  cancer.   Assessment/Recommendations/Plan:  Continue Megace. Awaiting g tube placement Patient would like to continue full scope of tx.  Recommend palliative outpatient.     Code Status:    Code Status Orders  (From admission, onward)        Start     Ordered   02/19/18 1233  Full code  Continuous     02/19/18 1232    Code Status History    Date Active Date Inactive Code Status Order ID Comments User Context   02/19/2016 1355 02/22/2016 2000 Full Code [id number]  Lytle Butte, MD ED   02/07/2016 0230 02/09/2016 1859 Full Code [id number]  Harrie Foreman, MD Inpatient   01/22/2016 0552 01/26/2016 1805 Full Code [id number]  Harrie Foreman, MD Inpatient   08/17/2015 2229 08/21/2015 1800 Full Code [id number]  Lance Coon, MD Inpatient   12/12/2014 0119 12/18/2014 1659 Full Code [id number]  Hower, Aaron Mose, MD ED       Prognosis:   Unable to determine  Discharge Planning:  To Be Determined   Thank you for allowing the Palliative Medicine Team to assist in the care of this patient.   Total Time 25 min Prolonged Time Billed  no      Greater than 50%  of this time was spent counseling and coordinating care related to the above assessment and plan.  Asencion Gowda, NP  Please contact Palliative Medicine Team phone at [phone] for questions and concerns.

## 2018-02-27 ENCOUNTER — Encounter
Admission: EM | Disposition: A | Discharge status: Skilled Nursing Facility | Payer: Self-pay | Source: Home / Self Care | Attending: Internal Medicine

## 2018-02-27 ENCOUNTER — Inpatient Hospital Stay: Payer: Medicare Other | Admitting: Anesthesiology

## 2018-02-27 ENCOUNTER — Other Ambulatory Visit: Payer: Self-pay

## 2018-02-27 HISTORY — PX: LAPAROSCOPIC INSERTION GASTROSTOMY TUBE: SHX6817

## 2018-02-27 LAB — BASIC METABOLIC PANEL
Anion gap: 8 (ref 5–15)
BUN: 9 mg/dL (ref 8–23)
CO2: 28 mmol/L (ref 22–32)
CREATININE: 0.9 mg/dL (ref 0.61–1.24)
Calcium: 8 mg/dL — ABNORMAL LOW (ref 8.9–10.3)
Chloride: 102 mmol/L (ref 98–111)
GFR calc Af Amer: >60 mL/min (ref >60)
GFR calc non Af Amer: >60 mL/min (ref >60)
Glucose, Bld: 73 mg/dL (ref 70–99)
POTASSIUM: 3.5 mmol/L (ref 3.5–5.1)
Sodium: 138 mmol/L (ref 135–145)

## 2018-02-27 SURGERY — INSERTION, GASTROSTOMY TUBE, PERCUTANEOUS
Anesthesia: General | Site: Abdomen | Wound class: "Clean Contaminated "

## 2018-02-27 MED ORDER — ACETAMINOPHEN 10 MG/ML IV SOLN
INTRAVENOUS | Status: AC
  Filled 2018-02-27: qty 100

## 2018-02-27 MED ORDER — SODIUM CHLORIDE 0.9 % IV SOLN
INTRAVENOUS | Status: DC
  Administered 2018-02-27: 18:00:00 via INTRAVENOUS

## 2018-02-27 MED ORDER — MIDAZOLAM HCL 2 MG/2ML IJ SOLN
INTRAMUSCULAR | Status: DC | PRN
  Administered 2018-02-27: 2 mg via INTRAVENOUS

## 2018-02-27 MED ORDER — LIDOCAINE-EPINEPHRINE (PF) 1 %-1:200000 IJ SOLN
INTRAMUSCULAR | Status: AC
  Filled 2018-02-27: qty 30

## 2018-02-27 MED ORDER — FENTANYL CITRATE (PF) 100 MCG/2ML IJ SOLN
25.0000 ug | INTRAMUSCULAR | Status: DC | PRN
  Administered 2018-02-27: 50 ug via INTRAVENOUS
  Administered 2018-02-27 (×2): 25 ug via INTRAVENOUS

## 2018-02-27 MED ORDER — OXYCODONE HCL 5 MG/5ML PO SOLN: 5.0000 mg | Freq: Once | ORAL | Status: DC | PRN

## 2018-02-27 MED ORDER — KETOROLAC TROMETHAMINE 30 MG/ML IJ SOLN
INTRAMUSCULAR | Status: DC | PRN
  Administered 2018-02-27: 30 mg via INTRAVENOUS

## 2018-02-27 MED ORDER — MIDAZOLAM HCL 2 MG/2ML IJ SOLN
INTRAMUSCULAR | Status: AC
  Filled 2018-02-27: qty 2

## 2018-02-27 MED ORDER — LIDOCAINE-EPINEPHRINE (PF) 1 %-1:200000 IJ SOLN
INTRAMUSCULAR | Status: DC | PRN
  Administered 2018-02-27: 11 mL

## 2018-02-27 MED ORDER — LIDOCAINE HCL (PF) 2 % IJ SOLN
INTRAMUSCULAR | Status: AC
  Filled 2018-02-27: qty 10

## 2018-02-27 MED ORDER — LACTATED RINGERS IV SOLN
INTRAVENOUS | Status: DC | PRN
  Administered 2018-02-27: 10:00:00 via INTRAVENOUS

## 2018-02-27 MED ORDER — ACETAMINOPHEN 10 MG/ML IV SOLN
INTRAVENOUS | Status: DC | PRN
  Administered 2018-02-27: 1000 mg via INTRAVENOUS

## 2018-02-27 MED ORDER — SUCCINYLCHOLINE CHLORIDE 20 MG/ML IJ SOLN
INTRAMUSCULAR | Status: DC | PRN
  Administered 2018-02-27: 100 mg via INTRAVENOUS

## 2018-02-27 MED ORDER — PROPOFOL 10 MG/ML IV BOLUS
INTRAVENOUS | Status: DC | PRN
  Administered 2018-02-27: 100 mg via INTRAVENOUS

## 2018-02-27 MED ORDER — FREE WATER
180.0000 mL | Freq: Three times a day (TID) | Status: DC
  Administered 2018-02-28: 180 mL

## 2018-02-27 MED ORDER — ONDANSETRON HCL 4 MG/2ML IJ SOLN
INTRAMUSCULAR | Status: AC
Start: 2018-02-27 — End: ?
  Filled 2018-02-27: qty 2

## 2018-02-27 MED ORDER — PHENYLEPHRINE HCL 10 MG/ML IJ SOLN
INTRAMUSCULAR | Status: DC | PRN
  Administered 2018-02-27: 25 ug/min via INTRAVENOUS

## 2018-02-27 MED ORDER — FENTANYL CITRATE (PF) 100 MCG/2ML IJ SOLN
INTRAMUSCULAR | Status: AC
  Administered 2018-02-27: 25 ug via INTRAVENOUS
  Filled 2018-02-27: qty 2

## 2018-02-27 MED ORDER — PROPOFOL 10 MG/ML IV BOLUS
INTRAVENOUS | Status: AC
  Filled 2018-02-27: qty 20

## 2018-02-27 MED ORDER — DEXAMETHASONE SODIUM PHOSPHATE 10 MG/ML IJ SOLN
INTRAMUSCULAR | Status: AC
  Filled 2018-02-27: qty 1

## 2018-02-27 MED ORDER — BUPIVACAINE HCL (PF) 0.5 % IJ SOLN
INTRAMUSCULAR | Status: AC
  Filled 2018-02-27: qty 30

## 2018-02-27 MED ORDER — SUGAMMADEX SODIUM 200 MG/2ML IV SOLN
INTRAVENOUS | Status: DC | PRN
  Administered 2018-02-27: 100 mg via INTRAVENOUS

## 2018-02-27 MED ORDER — LABETALOL HCL 5 MG/ML IV SOLN
INTRAVENOUS | Status: DC | PRN
  Administered 2018-02-27: 7.5 mg via INTRAVENOUS

## 2018-02-27 MED ORDER — FENTANYL CITRATE (PF) 100 MCG/2ML IJ SOLN
INTRAMUSCULAR | Status: AC
  Filled 2018-02-27: qty 2

## 2018-02-27 MED ORDER — DEXMEDETOMIDINE HCL 200 MCG/2ML IV SOLN
INTRAVENOUS | Status: DC | PRN
  Administered 2018-02-27: 8 ug via INTRAVENOUS
  Administered 2018-02-27: 4 ug via INTRAVENOUS

## 2018-02-27 MED ORDER — OSMOLITE 1.5 CAL PO LIQD
120.0000 mL | Freq: Three times a day (TID) | ORAL | Status: DC
  Administered 2018-02-28 (×2): 120 mL

## 2018-02-27 MED ORDER — KETOROLAC TROMETHAMINE 30 MG/ML IJ SOLN
INTRAMUSCULAR | Status: AC
  Filled 2018-02-27: qty 1

## 2018-02-27 MED ORDER — FENTANYL CITRATE (PF) 100 MCG/2ML IJ SOLN
INTRAMUSCULAR | Status: DC | PRN
  Administered 2018-02-27 (×2): 50 ug via INTRAVENOUS

## 2018-02-27 MED ORDER — PHENYLEPHRINE HCL 10 MG/ML IJ SOLN
INTRAMUSCULAR | Status: DC | PRN
  Administered 2018-02-27: 300 ug via INTRAVENOUS
  Administered 2018-02-27: 150 ug via INTRAVENOUS

## 2018-02-27 MED ORDER — DEXAMETHASONE SODIUM PHOSPHATE 10 MG/ML IJ SOLN
INTRAMUSCULAR | Status: DC | PRN
  Administered 2018-02-27: 5 mg via INTRAVENOUS

## 2018-02-27 MED ORDER — SUGAMMADEX SODIUM 200 MG/2ML IV SOLN
INTRAVENOUS | Status: AC
  Filled 2018-02-27: qty 2

## 2018-02-27 MED ORDER — OXYCODONE HCL 5 MG PO TABS: 5.0000 mg | ORAL_TABLET | Freq: Once | ORAL | Status: DC | PRN

## 2018-02-27 MED ORDER — ROCURONIUM BROMIDE 100 MG/10ML IV SOLN
INTRAVENOUS | Status: DC | PRN
  Administered 2018-02-27: 20 mg via INTRAVENOUS
  Administered 2018-02-27: 5 mg via INTRAVENOUS
  Administered 2018-02-27 (×2): 10 mg via INTRAVENOUS

## 2018-02-27 MED ORDER — LIDOCAINE HCL (CARDIAC) PF 100 MG/5ML IV SOSY
PREFILLED_SYRINGE | INTRAVENOUS | Status: DC | PRN
  Administered 2018-02-27: 80 mg via INTRAVENOUS

## 2018-02-27 SURGICAL SUPPLY — 47 items
"PENCIL ELECTRO HAND CTR " (MISCELLANEOUS) ×1 IMPLANT
BLADE SURG 11 STRL SS SAFETY (MISCELLANEOUS) ×3 IMPLANT
CANISTER SUCT 1200ML W/VALVE (MISCELLANEOUS) ×3 IMPLANT
CHLORAPREP W/TINT 26ML (MISCELLANEOUS) ×3 IMPLANT
DECANTER SPIKE VIAL GLASS SM (MISCELLANEOUS) ×2 IMPLANT
DEFOGGER SCOPE WARMER CLEARIFY (MISCELLANEOUS) IMPLANT
DERMABOND ADVANCED (GAUZE/BANDAGES/DRESSINGS) ×2
DERMABOND ADVANCED .7 DNX12 (GAUZE/BANDAGES/DRESSINGS) ×1 IMPLANT
DRAIN CHANNEL JP 15F RND 16 (MISCELLANEOUS) ×3 IMPLANT
ELECT CAUTERY BLADE 6.4 (BLADE) IMPLANT
ELECT REM PT RETURN 9FT ADLT (ELECTROSURGICAL) ×3
ELECTRODE REM PT RTRN 9FT ADLT (ELECTROSURGICAL) ×1 IMPLANT
GLOVE BIOGEL PI IND STRL 7.0 (GLOVE) ×1 IMPLANT
GLOVE BIOGEL PI INDICATOR 7.0 (GLOVE) ×2
GLOVE SURG SYN 6.5 ES PF (GLOVE) ×3 IMPLANT
GLOVE SURG SYN 6.5 PF PI (GLOVE) ×1 IMPLANT
GOWN STRL REUS W/ TWL LRG LVL3 (GOWN DISPOSABLE) ×3 IMPLANT
GOWN STRL REUS W/TWL LRG LVL3 (GOWN DISPOSABLE) ×6
HANDLE YANKAUER SUCT BULB TIP (MISCELLANEOUS) ×2 IMPLANT
IRRIGATION STRYKERFLOW (MISCELLANEOUS) IMPLANT
IRRIGATOR STRYKERFLOW (MISCELLANEOUS)
IV NS 1000ML (IV SOLUTION) ×2
IV NS 1000ML BAXH (IV SOLUTION) ×1 IMPLANT
KIT PINK PAD W/HEAD ARE REST (MISCELLANEOUS) ×3
KIT PINK PAD W/HEAD ARM REST (MISCELLANEOUS) ×1 IMPLANT
L-HOOK LAP DISP 36CM (ELECTROSURGICAL) ×3
LABEL OR SOLS (LABEL) ×2 IMPLANT
LHOOK LAP DISP 36CM (ELECTROSURGICAL) IMPLANT
Laparoscopic wire L-Hook electrode ×2 IMPLANT
NEEDLE HYPO 22GX1.5 SAFETY (NEEDLE) ×3 IMPLANT
NEEDLE VERESS 14GA 120MM (NEEDLE) ×3 IMPLANT
PACK LAP CHOLECYSTECTOMY (MISCELLANEOUS) ×3 IMPLANT
PENCIL ELECTRO HAND CTR (MISCELLANEOUS) ×6 IMPLANT
SCISSORS METZENBAUM CVD 33 (INSTRUMENTS) IMPLANT
SLEEVE ENDOPATH XCEL 5M (ENDOMECHANICALS) ×3 IMPLANT
SPONGE DRAIN TRACH 4X4 STRL 2S (GAUZE/BANDAGES/DRESSINGS) ×2 IMPLANT
SPONGE LAP 18X18 RF (DISPOSABLE) IMPLANT
SUT MNCRL 4-0 (SUTURE) ×2
SUT MNCRL 4-0 27XMFL (SUTURE) ×1
SUT VIC AB 3-0 SH 27 (SUTURE) ×4
SUT VIC AB 3-0 SH 27X BRD (SUTURE) ×2 IMPLANT
SUTURE MNCRL 4-0 27XMF (SUTURE) ×1 IMPLANT
TROCAR XCEL BLUNT TIP 100MML (ENDOMECHANICALS) ×3 IMPLANT
TROCAR XCEL NON-BLD 5MMX100MML (ENDOMECHANICALS) ×3 IMPLANT
TUBE GASTRO 14F 5C (TUBING) IMPLANT
TUBE JEJUNO 16X14 (TUBING) ×2 IMPLANT
TUBING INSUFFLATION (TUBING) ×3 IMPLANT

## 2018-02-27 NOTE — Anesthesia Postprocedure Evaluation (Signed)
Anesthesia Post Note  Patient: Thomas [name]  Procedure(s) Performed: LAPAROSCOPIC INSERTION GASTROSTOMY TUBE (N/A Abdomen)  Patient location during evaluation: PACU Anesthesia Type: General Level of consciousness: awake and alert Pain management: pain level controlled Vital Signs Assessment: post-procedure vital signs reviewed and stable Respiratory status: spontaneous breathing, nonlabored ventilation, respiratory function stable and patient connected to nasal cannula oxygen Cardiovascular status: blood pressure returned to baseline and stable Postop Assessment: no apparent nausea or vomiting Anesthetic complications: no     Last Vitals:  Vitals:   02/27/18 1305 02/27/18 1330  BP: 136/69 139/84  Pulse: 74 77  Resp: 17   Temp: 36.7 C   SpO2: 95% 96%    Last Pain:  Vitals:   02/27/18 1305  TempSrc:   PainSc: 4                  Precious Haws Piscitello

## 2018-02-27 NOTE — Op Note (Signed)
Preoperative diagnosis: dysphagia/malnutrition  Postoperative diagnosis:same  Procedure: laparoscopic gastrostomy tube placement  Anesthesia: GETA  Surgeon: Lysle Pearl Assistant: Cintron-Diaz  Wound Classification: Clean Contaminated   Indications:  Patient is a 67 y.o. 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 Surgical Gastrostomy tube placement was chosen as the route of nutritional support b/c colon was interfering for safe access via fluoro or endoscopy.   Findings: 1. Successful placement of gastrostomy tube.  Description of procedure: The patient was placed in the supine position and general endotracheal anesthesia was induced. Preoperative antibiotics were given. The abdomen was prepped and draped in the usual sterile fashion. A timeout was completed verifying correct patient, procedure, site, positioning, and implant(s) and/or special equipment prior to beginning this procedure.  A short midline supraumbilical incision was made and deepened through the subcutaneous tissues. Hemostasis was achieved with electrocautery. 0 vicryl anchor sutures x2 placed once fascia located.  The linea alba was identified and incised and the peritoneal cavity entered.  Hasson trocar placed and abdomen insufflated to 48mmHg without dramatic increase in pressure.  Camera inserted and no injury during insertion confirmed.  Two additional 32mm ports placed lateral to camera port on each side under direct visualization.  Attention turned to stomach and noted to have good window for gastrostomy placement, and enough laxity to bring stomach up to abdominal wall with no tension.  A pursestring suture of 3-0 silk was placed on the anterior stomach wall using two needle drivers.    After this was completed, a JP drain fascia incisior was used to creat a skin incision all the way through fascia to pass a 20Fr gastrostomy tube into abdominal cavity.  Right angle and  electrocautery was used to then create a gastrostomy in the center of the purse string suture, picking up the deep mucosa with right angle to ensure opening into lumen.  The gastrostomy tube was then grasped with maryland and passed through the gastrostomy with ease.  Purse string tightened around it.  81ml of saline infused into balloon which was visualized.  Insufflation dropped to 48mm Hg, and stomach was approximated to anterior abdominal wall using the gastrostomy tube.  The tail end of the purse string suture was then used to secure it to the anterior abdominal wall by passing it through the fascia.  This was then tied down.  Hemostasis was checked and ports removed and abdomen desufflated.  The hasson port fascia was closed with 0 vicryl in figure eight fashion.  All skin port sites then  closed with subcuticular sutures of Monocryl 4-0.    The patient tolerated the procedure well and was taken to the postanesthesia care unit in stable condition.  Specimen: None  Complications: None  Estimated Blood Loss: 77mL

## 2018-02-27 NOTE — Interval H&P Note (Signed)
History and Physical Interval Note:  02/27/2018 9:36 AM  Thomas [name]  has presented today for surgery, with the diagnosis of dyphagia/malnutrition.  The various methods of treatment have been discussed with the patient and family. After consideration of risks, benefits and other options for treatment, the patient has consented to  Procedure(s): LAPAROSCOPIC INSERTION GASTROSTOMY TUBE/ POSSIBLE OPEN (N/A) as a surgical intervention .  The patient's history has been reviewed, patient examined, no change in status, stable for surgery.  I have reviewed the patient's chart and labs.  Questions were answered to the patient's satisfaction.     [name] Lysle Pearl

## 2018-02-27 NOTE — Anesthesia Procedure Notes (Signed)
Procedure Name: Intubation Date/Time: 02/27/2018 9:47 AM Performed by: Andria Frames, MD Pre-anesthesia Checklist: Patient identified, Emergency Drugs available, Suction available, Patient being monitored and Timeout performed Patient Re-evaluated:Patient Re-evaluated prior to induction Oxygen Delivery Method: Circle system utilized Preoxygenation: Pre-oxygenation with 100% oxygen Induction Type: IV induction Ventilation: Mask ventilation with difficulty, Oral airway inserted - appropriate to patient size and Two handed mask ventilation required Laryngoscope Size: McGraph and 4 Grade View: Grade I Tube type: Oral Tube size: 7.5 mm Number of attempts: 1 Airway Equipment and Method: Stylet Placement Confirmation: ETT inserted through vocal cords under direct vision,  positive ETCO2 and breath sounds checked- equal and bilateral Secured at: 22 cm Tube secured with: Tape Dental Injury: Teeth and Oropharynx as per pre-operative assessment  Difficulty Due To: Difficulty was anticipated Future Recommendations: Recommend- induction with short-acting agent, and alternative techniques readily available

## 2018-02-27 NOTE — Transfer of Care (Signed)
Immediate Anesthesia Transfer of Care Note  Patient: Thomas [name]  Procedure(s) Performed: LAPAROSCOPIC INSERTION GASTROSTOMY TUBE (N/A Abdomen)  Patient Location: PACU  Anesthesia Type:General  Level of Consciousness: drowsy  Airway & Oxygen Therapy: Patient Spontanous Breathing and Patient connected to face mask oxygen  Post-op Assessment: Report given to RN and Post -op Vital signs reviewed and stable  Post vital signs: Reviewed and stable  Last Vitals:  Vitals Value Taken Time  BP 135/71 02/27/2018 12:10 PM  Temp    Pulse 68 02/27/2018 12:11 PM  Resp 22 02/27/2018 12:11 PM  SpO2 100 % 02/27/2018 12:11 PM  Vitals shown include unvalidated device data.  Last Pain:  Vitals:   02/27/18 0902  TempSrc: Oral  PainSc: 0-No pain      Patients Stated Pain Goal: 5 (93/96/88 6484)  Complications: No apparent anesthesia complications

## 2018-02-27 NOTE — Brief Op Note (Signed)
02/27/2018  12:05 PM  PATIENT:  Thomas [name]  67 y.o. male  PRE-OPERATIVE DIAGNOSIS:  n/a  POST-OPERATIVE DIAGNOSIS:  dysphasia and malnutrition  PROCEDURE:  Procedure(s): LAPAROSCOPIC INSERTION GASTROSTOMY TUBE (N/A)  SURGEON:  Surgeon(s) and Role:    Lysle Pearl, Antione Obar, DO - Primary    * Herbert Pun, MD - Assisting  ANESTHESIA:   general  EBL:  minimal  BLOOD ADMINISTERED:none  DRAINS: Gastrostomy Tube   LOCAL MEDICATIONS USED:  MARCAINE    and LIDOCAINE   SPECIMEN:  No Specimen  DISPOSITION OF SPECIMEN:  N/A  COUNTS:  YES  TOURNIQUET:  * No tourniquets in log *  DICTATION: .Note written in EPIC  PLAN OF CARE: return to room  PATIENT DISPOSITION:  PACU - hemodynamically stable.   Delay start of Pharmacological VTE agent (>24hrs) due to surgical blood loss or risk of bleeding: no

## 2018-02-27 NOTE — Progress Notes (Signed)
PT Cancellation Note  Patient Details Name: SAIR FAULCON MRN: [id number] DOB: 1951-06-26   Cancelled Treatment:    Reason Eval/Treat Not Completed: (P) Patient at procedure or test/unavailable PT hold treatment secondary to PEG placement scheduled for this Morning 0930, confirmed with nurse.  Rosario Adie, SPT    Rosario Adie 02/27/2018, 8:51 AM

## 2018-02-27 NOTE — Progress Notes (Signed)
OT Cancellation Note  Patient Details Name: ELGIE MAZIARZ MRN: [id number] DOB: [birth year]   Cancelled Treatment:    Reason Eval/Treat Not Completed: Patient at procedure or test/ unavailable. Pt out of the room for scheduled PEG placement. Will re-attempt next date as medically appropriate.   Jeni Salles, MPH, MS, OTR/L ascom [phone] 02/27/18, 10:08 AM

## 2018-02-27 NOTE — Progress Notes (Signed)
Thomas [name] at [facility] NAME: Thomas [name]    MR#:  [id number]  DATE OF BIRTH:  Mar 27, 1951  SUBJECTIVE:  CHIEF COMPLAINT:   Chief Complaint  Patient presents with  . Neck Pain   Patient seen today Has generalized weakness And failed modified barium swallow study No fever No chest pain Had PEG Tube today. Asking for food.  REVIEW OF SYSTEMS:    ROS  CONSTITUTIONAL: No documented fever. Has fatigue, weakness. No weight gain, has weight loss.  EYES: No blurry or double vision.  ENT: No tinnitus. No postnasal drip. No redness of the oropharynx.  Has difficulty swallowing food RESPIRATORY: No cough, no wheeze, no hemoptysis. No dyspnea.  CARDIOVASCULAR: No chest pain. No orthopnea. No palpitations. No syncope.  GASTROINTESTINAL: No nausea, no vomiting or diarrhea. No abdominal pain. No melena or hematochezia.  GENITOURINARY: No dysuria or hematuria.  ENDOCRINE: No polyuria or nocturia. No heat or cold intolerance.  HEMATOLOGY: No anemia. No bruising. No bleeding.  INTEGUMENTARY: No rashes. No lesions.  MUSCULOSKELETAL: No arthritis. No swelling. No gout.  NEUROLOGIC: No numbness, tingling, or ataxia. No seizure-type activity.  PSYCHIATRIC: No anxiety. No insomnia. No ADD.   DRUG ALLERGIES:  No Known Allergies  VITALS:  Blood pressure (!) 146/69, pulse 82, temperature 98.6 F (37 C), temperature source Oral, resp. rate 20, height 5\' 7"  (1.702 m), weight 49.9 kg (110 lb), SpO2 94 %.  PHYSICAL EXAMINATION:   Physical Exam  GENERAL:  68 y.o.-year-old cachectic male patient lying in the bed with no acute distress.  EYES: Pupils equal, round, reactive to light and accommodation. No scleral icterus. Extraocular muscles intact.  HEENT: Head atraumatic, normocephalic. Oropharynx and nasopharynx clear.  Cervical lymphadenopathy NECK:  Supple, no jugular venous distention. No thyroid enlargement, no tenderness.  LUNGS: Normal breath sounds  bilaterally, no wheezing, rales, rhonchi. No use of accessory muscles of respiration.  CARDIOVASCULAR: S1, S2 normal. No murmurs, rubs, or gallops.  ABDOMEN: Soft, nontender, nondistended. Bowel sounds present. No organomegaly or mass. PEG in place. EXTREMITIES: No cyanosis, clubbing or edema b/l.    NEUROLOGIC: Cranial nerves II through XII are intact. No focal Motor or sensory deficits b/l.  Generalized weakness. PSYCHIATRIC: The patient is alert and oriented x 3.  SKIN: No obvious rash, lesion, or ulcer.   LABORATORY PANEL:   CBC Recent Labs  Lab 02/26/18 0539  WBC 9.5  HGB 10.3*  HCT 29.9*  PLT 274   ------------------------------------------------------------------------------------------------------------------ Chemistries  Recent Labs  Lab 02/21/18 0433  02/27/18 0448  NA 136   < > 138  K 3.7   < > 3.5  CL 102   < > 102  CO2 29   < > 28  GLUCOSE 111*   < > 73  BUN 18   < > 9  CREATININE 1.20   < > 0.90  CALCIUM 8.7*   < > 8.0*  MG 2.0  --   --    < > = values in this interval not displayed.   ------------------------------------------------------------------------------------------------------------------  Cardiac Enzymes No results for input(s): TROPONINI in the last 168 hours. ------------------------------------------------------------------------------------------------------------------  RADIOLOGY:  No results found.   ASSESSMENT AND PLAN:  67 year old male patient with history of head and neck cancer bipolar disorder, COPD, chronic kidney disease stage III currently under hospitalist service for severe malnutrition, cervical lymphadenopathy and low sodium level  - aspiration pneumonia   Start unasyn.  -Dysphagia Discussed with gastroenterology who recommended interventional radiology consultation  for PEG tube placement Patient failed modified barium swallow study Currently n.p.o.  Interventional radiology have ordered CT abd to check anatomy. As  he has transverse colon with his stomach, radiologist and GI had refused to do the procedure as there would be risk of rupturing the colon.  And suggested to call general surgery had done PEG 02/27/18. Start feeding tomorrow.  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  -Head and neck cancer metastatic Appreciate oncology follow-up  palliative care to help advance goals of care discussion.  -Hyponatremia improved  SIADH  Off Salt tablets  -Accidental fall Pelvic x-ray no evidence of fracture Physical therapy evaluation for endurance training Plan to send to [facility] once stable.  -Severe malnutrition Nutritional supplements Start feeding via tube tomorrow.  -COPD stable  -Tobacco cessation counseled to the patient for six minutes Nicotine patch offered  -Alcohol abuse On alcohol cessation counseling given On thiamine supplements   All the records are reviewed and case discussed with Care Management/Social Worker. Management plans discussed with the patient, family and they are in agreement.  CODE STATUS: Full code.  DVT Prophylaxis: SCDs  TOTAL TIME TAKING CARE OF THIS PATIENT: 35 minutes.  Discussed with patient's daughter on the phone.  POSSIBLE D/C IN 2 to 3 DAYS, DEPENDING ON CLINICAL CONDITION.  Vaughan Basta M.D on 02/27/2018 at 5:04 PM  Between 7am to 6pm - Pager - [phone]  After 6pm go to [email or web address] - password [email or web address] [facility]  Office  [phone]  CC: Primary care physician; [facility]  Note: This dictation was prepared with Dragon dictation along with smaller phrase technology. Any transcriptional errors that result from this process are unintentional.

## 2018-02-27 NOTE — Anesthesia Post-op Follow-up Note (Signed)
Anesthesia QCDR form completed.        

## 2018-02-27 NOTE — Care Management Important Message (Signed)
Important Message  Patient Details  Name: SABA NEUMAN MRN: [id number] Date of Birth: 14-Nov-1950   Medicare Important Message Given:  Yes    Juliann Pulse A Alvaro Aungst 02/27/2018, 1:25 PM

## 2018-02-28 DIAGNOSIS — R634 Abnormal weight loss: Secondary | ICD-10-CM

## 2018-02-28 DIAGNOSIS — R221 Localized swelling, mass and lump, neck: Secondary | ICD-10-CM

## 2018-02-28 MED ORDER — FREE WATER
180.0000 mL | Freq: Every day | Status: DC
Start: 2018-02-28 — End: 2018-03-02
  Administered 2018-02-28 – 2018-03-01 (×3): 180 mL

## 2018-02-28 MED ORDER — VENLAFAXINE HCL 37.5 MG PO TABS
37.5000 mg | ORAL_TABLET | Freq: Two times a day (BID) | ORAL | Status: DC
  Administered 2018-02-28 – 2018-03-08 (×14): 37.5 mg
  Filled 2018-02-28 (×19): qty 1

## 2018-02-28 MED ORDER — OXYCODONE HCL 5 MG PO TABS
5.0000 mg | ORAL_TABLET | ORAL | Status: DC | PRN
Start: 2018-02-28 — End: 2018-03-08
  Administered 2018-02-28 – 2018-03-07 (×10): 5 mg
  Filled 2018-02-28 (×10): qty 1

## 2018-02-28 MED ORDER — MORPHINE SULFATE (PF) 2 MG/ML IV SOLN
2.0000 mg | INTRAVENOUS | Status: DC | PRN
  Administered 2018-02-28 – 2018-03-02 (×2): 2 mg via INTRAVENOUS
  Filled 2018-02-28 (×2): qty 1

## 2018-02-28 MED ORDER — ENOXAPARIN SODIUM 40 MG/0.4ML ~~LOC~~ SOLN
40.0000 mg | SUBCUTANEOUS | Status: DC
  Administered 2018-02-28 – 2018-03-01 (×2): 40 mg via SUBCUTANEOUS
  Filled 2018-02-28 (×2): qty 0.4

## 2018-02-28 MED ORDER — AMLODIPINE BESYLATE 5 MG PO TABS
2.5000 mg | ORAL_TABLET | Freq: Every day | ORAL | Status: DC
  Administered 2018-02-28 – 2018-03-03 (×4): 2.5 mg
  Filled 2018-02-28 (×4): qty 1

## 2018-02-28 MED ORDER — OSMOLITE 1.5 CAL PO LIQD
237.0000 mL | Freq: Every day | ORAL | Status: DC
  Administered 2018-02-28: 19:00:00 237 mL
  Administered 2018-03-01: 10:00:00 120 mL

## 2018-02-28 NOTE — Progress Notes (Signed)
Subjective:    Thomas [name] is a 67 y.o. male  Hospital stay day 8, 1 Day Post-Op lap gtube placement  No issues overnight.   Objective:      Temp:  [98 F (36.7 C)-98.7 F (37.1 C)] 98.7 F (37.1 C) (07/25 1507) Pulse Rate:  [79-86] 86 (07/25 0451) Resp:  [16-18] 18 (07/25 1507) BP: (141-172)/(74-90) 172/90 (07/25 1507) SpO2:  [95 %-99 %] 96 % (07/25 1507)     Height: 5\' 7"  (170.2 cm) Weight: 49.9 kg (110 lb) BMI (Calculated): 17.22   Intake/Output this shift:  No intake/output data recorded.   Intake/Output last 2 shifts:     Constitutional :  alert and cooperative  Respiratory:  clear to auscultation bilaterally  Cardiovascular:  regular rate and rhythm, S1, S2 normal, no murmur, click, rub or gallop  Gastrointestinal: soft, non-tender; bowel sounds normal; no masses,  no organomegaly and port site wounds c/d/i.  gtube in place with no issues.  minimal serous drainage..   Skin: Cool and moist.   Psychiatric: Normal affect, non-agitated, not confused       LABS:  CMP Latest Ref Rng & Units 02/27/2018 02/25/2018 02/22/2018  Glucose 70 - 99 mg/dL 73 94 120(H)  BUN 8 - 23 mg/dL 9 12 17   Creatinine 0.61 - 1.24 mg/dL 0.90 1.03 1.05  Sodium 135 - 145 mmol/L 138 135 139  Potassium 3.5 - 5.1 mmol/L 3.5 4.4 4.3  Chloride 98 - 111 mmol/L 102 100 106  CO2 22 - 32 mmol/L 28 30 28   Calcium 8.9 - 10.3 mg/dL 8.0(L) 8.1(L) 8.5(L)  Total Protein 6.5 - 8.1 g/dL - - -  Total Bilirubin 0.3 - 1.2 mg/dL - - -  Alkaline Phos 38 - 126 U/L - - -  AST 15 - 41 U/L - - -  ALT 17 - 63 U/L - - -   CBC Latest Ref Rng & Units 02/26/2018 02/25/2018 02/23/2018  WBC 3.8 - 10.6 K/uL 9.5 11.7(H) 6.8  Hemoglobin 13.0 - 18.0 g/dL 10.3(L) 9.9(L) 10.2(L)  Hematocrit 40.0 - 52.0 % 29.9(L) 29.8(L) 30.0(L)  Platelets 150 - 440 K/uL 274 277 272    RADS: none Assessment:      S/p lap g tube placement.  Healing well.  Ok to continue TF as tolerated. gen surg to sign off.  Please call with any  questions

## 2018-02-28 NOTE — Progress Notes (Signed)
Physical Therapy Treatment Patient Details Name: Thomas [name] MRN: [id number] DOB: November 03, 1950 Today's Date: 02/28/2018    History of Present Illness [name] is a 67yo male who comes [facility] on 7/16 c noted mass in neck x1 M, noted to have mets to neck from prior head/neck CA. While in hospital pt underwent surgery to have tube feed placed and pt was put under general anesthesia7/24. PT orders were continue at transfer after general anesthesia. Pt has been progressively weak at home with multiple recent falls. Pt also sustained a fall while admitted. Back ground info is limited at this time as pt is not a good historian and defers many questions to his daughter who not available at time of eval.     PT Comments    Pt is progressing towards goals and despite willingness to ambulate with PT there.ex was progressed. Pt did very well with there.ex and PT witnessed pt walk with nursing. PT recommends d/c to SNF secondary to increased weakness and pain that limited pt from ambulating OOB.   Follow Up Recommendations  SNF     Equipment Recommendations  Rolling walker with 5" wheels    Recommendations for Other Services       Precautions / Restrictions Precautions Precautions: Fall Restrictions Weight Bearing Restrictions: No    Mobility  Bed Mobility Overal bed mobility: (deffered per pt request)                Transfers Overall transfer level: (deffered per pt request)                  Ambulation/Gait Ambulation/Gait assistance: (deffered per pt request)               Stairs             Wheelchair Mobility    Modified Rankin (Stroke Patients Only)       Balance                                            Cognition Arousal/Alertness: Awake/alert Behavior During Therapy: WFL for tasks assessed/performed Overall Cognitive Status: Within Functional Limits for tasks assessed                                  General Comments: Mildly difficult to understand  due to difficulty enunciating words      Exercises Other Exercises Other Exercises: pt instructed in supine there.ex to include ankle pumps, quad/glute sets, heel slides, hip abd/add, and SLR all x10 B    General Comments        Pertinent Vitals/Pain Pain Assessment: 0-10 Pain Score: 9  Pain Location: Neck and stomach Pain Descriptors / Indicators: Aching;Grimacing Pain Intervention(s): Limited activity within patient's tolerance;Monitored during session;Repositioned    Home Living                      Prior Function            PT Goals (current goals can now be found in the care plan section) Acute Rehab PT Goals Patient Stated Goal: decrease pain PT Goal Formulation: With patient Time For Goal Achievement: 03/09/18 Potential to Achieve Goals: Fair Progress towards PT goals: Progressing toward goals    Frequency    Min 2X/week  PT Plan Discharge plan needs to be updated    Co-evaluation              AM-PAC PT "6 Clicks" Daily Activity  Outcome Measure  Difficulty turning over in bed (including adjusting bedclothes, sheets and blankets)?: A Little Difficulty moving from lying on back to sitting on the side of the bed? : A Little Difficulty sitting down on and standing up from a chair with arms (e.g., wheelchair, bedside commode, etc,.)?: A Little Help needed moving to and from a bed to chair (including a wheelchair)?: A Little Help needed walking in hospital room?: A Little Help needed climbing 3-5 steps with a railing? : A Little 6 Click Score: 18    End of Session Equipment Utilized During Treatment: Gait belt Activity Tolerance: Patient limited by pain;Patient limited by fatigue Patient left: in bed;with call bell/phone within reach;with bed alarm set Nurse Communication: Mobility status(pt request to not get out of bed) PT Visit Diagnosis: Unsteadiness on feet (R26.81);Other  abnormalities of gait and mobility (R26.89);Difficulty in walking, not elsewhere classified (R26.2);Muscle weakness (generalized) (M62.81)     Time: 1655-3748 PT Time Calculation (min) (ACUTE ONLY): 12 min  Charges:                        Rosario Adie, SPT    Rosario Adie 02/28/2018, 1:25 PM

## 2018-02-28 NOTE — Progress Notes (Signed)
Nutrition Follow-up  DOCUMENTATION CODES:   Severe malnutrition in context of chronic illness, Underweight  INTERVENTION:  Recommend slow advancement to goal TF regimen due to risk of refeeding syndrome. 7/25: Initiate Osmolite 1.5 Cal 1/2 can (~120 mL) TID. 7/26: Advance to Osmolite 1.5 Cal 1/2 can (~120 mL) 5 times daily. 7/27: Advance to goal regimen of Osmolite 1.5 Cal 1 can (237 mL) 5 times daily.  Goal TF regimen provides 1775 kcal, 75 grams of protein, 905 mL H2O daily.  Provide free water flush of 90 mL before and after each bolus tube feeding (total of 180 mL 5 times daily). Total of 1805 mL H2O daily including water in tube feeding.  Monitor magnesium, potassium, and phosphorus daily until patient is at goal TF regimen and electrolytes are stable, MD to replete as needed, as pt is at risk for refeeding syndrome given severe malnutrition.  Provided patient with "Guide to Adult Tube Feeding" booklet from [employer]. Discussed in general how tube feeding works, but patient is not appropriate for education at this time and daughter not present today. If he discharges to [facility] they will be able to care for his tube and continue teaching him about tube feeding while there.  Continue folic acid 1 mg daily per tube and thiamine 100 mg daily per tube in setting of EtOH abuse. Recommend continuing this supplementation after discharge. TF regimen also meets 100% RDIs for vitamins/minerals.  NUTRITION DIAGNOSIS:   Severe Malnutrition related to chronic illness(COPD, CKD stage III, EtOH abuse, hx H&N cancer, cervical lymphadenopathy concerning for recurrence/metastasis) as evidenced by severe fat depletion, severe muscle depletion.  Ongoing - addressing with TF regimen.  GOAL:   Patient will meet greater than or equal to 90% of their needs  Progressing with advancement of TF regimen.  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   -Patient underwent MBSS on 7/19. Findings including deficits consistent with late effects of XRT on swallowing and are not likely to improve with time. Patient is at severe risk for aspiration with PO intake so recommendation was made for patient to remain NPO. -On 7/22 placement of G-tube by IR was attempted but could not be completed as there was no safe percutaneous window to allow for percutaneous placement. -Per GI note on 7/22 it will also not be possible for GI to place tube endoscopically. -Per Oncology note it is recommended patient have outpatient PET. He will likely need immunotherapy +/- chemotherapy. -Patient s/p laparoscopic placement of G-tube on 7/24. -Patient is being worked-up for placement at [facility] after discharge.  Met with patient at bedside. He reports he tolerated his feeding well earlier today. Denies any N/V or abdominal pain afterwards. Per RN he did have some surgical site pain yesterday. Patient's daughter not present today. Patient reports he is hungry and looking forward to his next tube feeding. Discussed the need for slow  advancement due so we can monitor electrolytes.  Enteral Access: 16 Fr. Kangaroo G-tube with 20 cc balloon placed laparoscopically by surgeon on 7/24  Medications reviewed and include: fentanyl patch, folic acid 1 mg daily,  levothyroxine, MVI daily, senna-docusate, thiamine 100 mg daily, Unasyn.  Labs reviewed: On 7/24 Potassium WNL.  Discussed with RN. Patient tolerating tube feed well so far.  Diet Order:   Diet Order           Diet NPO time specified  Diet effective now          EDUCATION NEEDS:   Not appropriate for education at this time  Skin:  Skin Assessment: Reviewed RN Assessment(ecchymosis to bilateral arms)  Last BM:  PTA (02/18/2018 per chart)  Height:   Ht Readings from Last 1 Encounters:  02/27/18 _0  (1.702 m)    Weight:   Wt Readings from Last 1 Encounters:  02/27/18 110 lb (49.9 kg)    Ideal Body Weight:  67.3 kg  BMI:  Body mass index is 17.23 kg/m.  Estimated Nutritional Needs:   Kcal:  1630-1880 (MSJ x 1.3-1.5)  Protein:  75-90 grams (1.5-1.8 grams/kg)  Fluid:  1.6-1.9 L/day (1 mL/kcal)  Willey Blade, MS, RD, LDN Office: [phone] Pager: [phone] After Hours/Weekend Pager: [phone]

## 2018-02-28 NOTE — Progress Notes (Signed)
[place] [facility] [place] NAME: Thomas [name]    MR#:  [id number]  DATE OF BIRTH:  [birth year]  SUBJECTIVE:  CHIEF COMPLAINT:   Chief Complaint  Patient presents with  . Neck Pain   Patient to be started on tube feeds today.  No concerns regarding pain.  Complains of hunger. Afebrile.  REVIEW OF SYSTEMS:    ROS  CONSTITUTIONAL: No documented fever. Has fatigue, weakness. No weight gain, has weight loss.  EYES: No blurry or double vision.  ENT: No tinnitus. No postnasal drip. No redness of the oropharynx.  Has difficulty swallowing food RESPIRATORY: No cough, no wheeze, no hemoptysis. No dyspnea.  CARDIOVASCULAR: No chest pain. No orthopnea. No palpitations. No syncope.  GASTROINTESTINAL: No nausea, no vomiting or diarrhea. No abdominal pain. No melena or hematochezia.  GENITOURINARY: No dysuria or hematuria.  ENDOCRINE: No polyuria or nocturia. No heat or cold intolerance.  HEMATOLOGY: No anemia. No bruising. No bleeding.  INTEGUMENTARY: No rashes. No lesions.  MUSCULOSKELETAL: No arthritis. No swelling. No gout.  NEUROLOGIC: No numbness, tingling, or ataxia. No seizure-type activity.  PSYCHIATRIC: No anxiety. No insomnia. No ADD.   DRUG ALLERGIES:  No Known Allergies  VITALS:  Blood pressure (!) 152/74, pulse 86, temperature 98.7 F (37.1 C), resp. rate 16, height 5\' 7"  (1.702 m), weight 49.9 kg (110 lb), SpO2 95 %.  PHYSICAL EXAMINATION:   Physical Exam  GENERAL:  67 y.o.-year-old cachectic male patient lying in the bed with no acute distress.  EYES: Pupils equal, round, reactive to light and accommodation. No scleral icterus. Extraocular muscles intact.  HEENT: Head atraumatic, normocephalic. Oropharynx and nasopharynx clear.  Cervical lymphadenopathy NECK:  Supple, no jugular venous distention. No thyroid enlargement, no tenderness.  LUNGS: Normal breath sounds bilaterally, no wheezing, rales, rhonchi. No use of accessory muscles  of respiration.  CARDIOVASCULAR: S1, S2 normal. No murmurs, rubs, or gallops.  ABDOMEN: Soft, nontender, nondistended. Bowel sounds present. No organomegaly or mass. PEG in place. EXTREMITIES: No cyanosis, clubbing or edema b/l.    NEUROLOGIC: Cranial nerves II through XII are intact. No focal Motor or sensory deficits b/l.  Generalized weakness. PSYCHIATRIC: The patient is alert and oriented x 3.  SKIN: No obvious rash, lesion, or ulcer.   LABORATORY PANEL:   CBC Recent Labs  Lab 02/26/18 0539  WBC 9.5  HGB 10.3*  HCT 29.9*  PLT 274   ------------------------------------------------------------------------------------------------------------------ Chemistries  Recent Labs  Lab 02/27/18 0448  NA 138  K 3.5  CL 102  CO2 28  GLUCOSE 73  BUN 9  CREATININE 0.90  CALCIUM 8.0*   ------------------------------------------------------------------------------------------------------------------  Cardiac Enzymes No results for input(s): TROPONINI in the last 168 hours. ------------------------------------------------------------------------------------------------------------------  RADIOLOGY:  No results found.   ASSESSMENT AND PLAN:  67 year old male patient with history of head and neck cancer bipolar disorder, COPD, chronic kidney disease stage III currently under hospitalist service for severe malnutrition, cervical lymphadenopathy and low sodium level  - aspiration pneumonia   On unasyn.  -Dysphagia PICC could not be placed by IR or GI.  Surgical team help appreciated with PEG tube placement.  Started on tube feeds today.  Monitor electrolytes.  Likely discharge tomorrow.  -Cervical lymphadenopathy/mass S/p CT guided neck biopsy ,  Path results revealed metastatic squamous cell carcinoma Follow-up with cancer center after starting adequate diet for further management.  - Head and neck cancer Oncology follow-up as outpatient  palliative care to help with goals of  care discussion.  -  Hyponatremia - improved  SIADH  Off Salt tablets  -Accidental fall Pelvic x-ray no evidence of fracture Physical therapy evaluation for endurance training Plan to send to [facility] once stable.  -Severe malnutrition Nutritional supplements Start feeding via tube tomorrow.  -COPD stable  -Tobacco cessation Counseled to quit on admission  -Alcohol abuse Counseled on admission On thiamine supplements   All the records are reviewed and case discussed with Care Management/Social Worker. Management plans discussed with the patient, family and they are in agreement.  CODE STATUS: Full code.  DVT Prophylaxis: SCDs  TOTAL TIME TAKING CARE OF THIS PATIENT: 35 minutes.  Discussed with patient's daughter on the phone.  POSSIBLE D/C IN 2 to 3 DAYS, DEPENDING ON CLINICAL CONDITION.  Thomas [name] M.D on 02/28/2018 at 10:32 AM  Between 7am to 6pm - Pager - [phone] After 6pm go to [email or web address] - password [email or web address] [facility]  Office  [phone]  CC: Primary care physician; [facility]  Note: This dictation was prepared with Dragon dictation along with smaller phrase technology. Any transcriptional errors that result from this process are unintentional.

## 2018-02-28 NOTE — Progress Notes (Signed)
Daily Progress Note   Patient Name: Thomas [name]       Date: 02/28/2018 DOB: 1951-06-23  Age: 67 y.o. MRN#: [id number] Attending Physician: Hillary Bow, MD Primary Care Physician: [facility] Admit Date: 02/19/2018  Reason for Consultation/Follow-up: Establishing goals of care  Subjective: Patient is sitting in bed. He states he is very hungry. G tube placed yesterday. No other complains.   Megace discontinued as patient has G tube secondary to dysphagia, and is hungry.     Length of Stay: 8  Current Medications: Scheduled Meds:  . cloNIDine  0.1 mg Transdermal Weekly  . DULoxetine  30 mg Oral Daily  . feeding supplement (OSMOLITE 1.5 CAL)  120 mL Per Tube TID  . fentaNYL  12.5 mcg Transdermal Q72H  . fluticasone  1 spray Each Nare Daily  . folic acid  1 mg Oral Daily  . free water  180 mL Per Tube TID  . levothyroxine  25 mcg Intravenous Daily  . mometasone-formoterol  2 puff Inhalation BID  . multivitamin with minerals  1 tablet Oral Daily  . nicotine  14 mg Transdermal Daily  . senna-docusate  2 tablet Oral Daily  . thiamine  100 mg Oral Daily   Or  . thiamine  100 mg Intravenous Daily    Continuous Infusions: . ampicillin-sulbactam (UNASYN) IV Stopped (02/28/18 0511)    PRN Meds: acetaminophen **OR** acetaminophen, bisacodyl, clonazePAM, ipratropium-albuterol, labetalol, morphine injection, ondansetron **OR** ondansetron (ZOFRAN) IV, polyethylene glycol, traZODone  Physical Exam  Constitutional: No distress.  Pulmonary/Chest: Effort normal.  Neurological: He is alert.            Vital Signs: BP (!) 152/74 (BP Location: Left Arm)   Pulse 86   Temp 98.7 F (37.1 C)   Resp 16   Ht 5\' 7"  (1.702 m)   Wt 49.9 kg (110 lb)   SpO2 95%   BMI 17.23  kg/m  SpO2: SpO2: 95 % O2 Device: O2 Device: Room Air O2 Flow Rate: O2 Flow Rate (L/min): 2 L/min  Intake/output summary:   Intake/Output Summary (Last 24 hours) at 02/28/2018 1025 Last data filed at 02/28/2018 0300 Gross per 24 hour  Intake 1544.16 ml  Output 1 ml  Net 1543.16 ml   LBM: Last BM Date: 02/18/18 Baseline Weight: Weight: 61.2 kg (  135 lb) Most recent weight: Weight: 49.9 kg (110 lb)       Palliative Assessment/Data: NPO currently for procedure.       Patient Active Problem List   Diagnosis Date Noted  . Dysphagia   . Neck mass   . Head and neck cancer ([place]) 02/19/2018  . Cervical lymphadenopathy   . Poor appetite   . Loss of weight   . History of tongue cancer   . Protein-calorie malnutrition, severe 01/22/2016  . Alcohol use disorder, severe, dependence ([place]) 12/12/2014  . Hyponatremia 12/12/2014  . Essential hypertension 12/12/2014  . COPD (chronic obstructive pulmonary disease) ([place]) 12/12/2014    Palliative Care Assessment & Plan   Patient Profile: Patient is a 67 y.o. 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 He presented for fatigue and weight loss. He presented to the ED for left side neck swelling times approximately 1 month accompanied by weight loss lethargy very poor p.o. intake loss of appetite.  Recurrent squamous cell carcinoma/head and neck cancer.   Assessment/Recommendations/Plan:  D/C Megace.    Recommend palliative outpatient.     Code Status:    Code Status Orders  (From admission, onward)        Start     Ordered   02/19/18 1233  Full code  Continuous     02/19/18 1232    Code Status History    Date Active Date Inactive Code Status Order ID Comments User Context   02/19/2016 1355 02/22/2016 2000 Full Code [id number]  Lytle Butte, MD  ED   02/07/2016 0230 02/09/2016 1859 Full Code [id number]  Harrie Foreman, MD Inpatient   01/22/2016 0552 01/26/2016 1805 Full Code [id number]  Harrie Foreman, MD Inpatient   08/17/2015 2229 08/21/2015 1800 Full Code [id number]  Lance Coon, MD Inpatient   12/12/2014 0119 12/18/2014 1659 Full Code [id number]  Hower, Aaron Mose, MD ED       Prognosis:   Unable to determine  Discharge Planning:  To Be Determined   Thank you for allowing the Palliative Medicine Team to assist in the care of this patient.   Total Time 25 min Prolonged Time Billed  no      Greater than 50%  of this time was spent counseling and coordinating care related to the above assessment and plan.  Asencion Gowda, NP  Please contact Palliative Medicine Team phone at [phone] for questions and concerns.

## 2018-02-28 NOTE — Progress Notes (Signed)
Occupational Therapy Treatment Patient Details Name: Thomas [name] MRN: [id number] DOB: June 18, 1951 Today's Date: 02/28/2018    History of present illness Thomas [name] is a 67yo male who comes to [facility] on 7/16 c noted mass in neck x1 M, noted to have mets to neck from prior head/neck CA. While in hospital pt underwent surgery to have tube feed placed and pt was put under general anesthesia7/24. PT orders were continue at transfer after general anesthesia. Pt has been progressively weak at home with multiple recent falls. Pt also sustained a fall while admitted.    OT comments  Pt seen for OT tx this date. Pt in bed, complaints of significant neck pain. With repositioning in bed (pt able to perform himself with cues for sequencing) pt noted slight improvement in comfort. Pt unable to recall recent pain medication administered by the RN, which OT confirmed. Pt also unable to recall recent tube feeding which RN confirmed. Pt performed bed mobility and transfers from EOB and from regular height toilet with CGA. HHA+1 for ambulating to/from bathroom. Pt noting pain at the site of his recent PEG tube. RN notified at end of session regarding pt's pain and request to eat. Pt has not progresses as anticipated. Currently limited by poor activity tolerance, pain, balance, strength, and immediate/delayed recall. Given current impairments including new PEG tube with resulting functional impairments, pt unsafe to discharge home (a question of availability/security of a home environment). Updating pt's discharge recommendation to STR to work on strength, activity tolerance, safety, AE/DME training, self care skills training, PEG tube mgt training, and energy conservation strategies to support maximizing independence and quality of life while minimizing falls risk and caregiver burden.    Follow Up Recommendations  SNF    Equipment Recommendations  Other (comment)(TBD)    Recommendations for Other Services       Precautions / Restrictions Precautions Precautions: Fall Restrictions Weight Bearing Restrictions: No       Mobility Bed Mobility Overal bed mobility: Needs Assistance Bed Mobility: Supine to Sit;Sit to Supine     Supine to sit: Supervision Sit to supine: Supervision      Transfers Overall transfer level: Needs assistance Equipment used: None Transfers: Sit to/from Stand Sit to Stand: Supervision;Min guard              Balance Overall balance assessment: Needs assistance;History of Falls Sitting-balance support: Bilateral upper extremity supported;Feet supported Sitting balance-Leahy Scale: Good     Standing balance support: Single extremity supported Standing balance-Leahy Scale: Fair                             ADL either performed or assessed with clinical judgement   ADL Overall ADL's : Needs assistance/impaired Eating/Feeding: NPO Eating/Feeding Details (indicate cue type and reason): new peg tube                     Toilet Transfer: Regular Toilet;Ambulation;Min guard [employer] forces technical officer Details (indicate cue type and reason): HHA +1 and CGA Toileting- Clothing Manipulation and Hygiene: Sitting/lateral lean;Independent       Functional mobility during ADLs: Min guard       Vision Patient Visual Report: No change from baseline     Perception     Praxis      Cognition Arousal/Alertness: Awake/alert Behavior During Therapy: WFL for tasks assessed/performed Overall Cognitive Status: Within Functional Limits for tasks assessed  General Comments: grossly WFL, slightly difficult to understanding, follows commands, poor immediate and delayed recall (did not know he just had pain meds and a tube feed)        Exercises    Shoulder Instructions       General Comments      Pertinent Vitals/ Pain       Pain Assessment: 0-10 Pain Score: 10-Worst pain ever Pain Location: Neck and  stomach Pain Descriptors / Indicators: Aching;Grimacing Pain Intervention(s): Limited activity within patient's tolerance;Monitored during session;Premedicated before session;Patient requesting pain meds-RN notified;Repositioned  Home Living                                          Prior Functioning/Environment              Frequency  Min 1X/week        Progress Toward Goals  OT Goals(current goals can now be found in the care plan section)  Progress towards OT goals: OT to reassess next treatment  Acute Rehab OT Goals Patient Stated Goal: decrease pain OT Goal Formulation: With patient Time For Goal Achievement: 03/09/18 Potential to Achieve Goals: [place] Frequency remains appropriate;Discharge plan needs to be updated    Co-evaluation                 AM-PAC PT "6 Clicks" Daily Activity     Outcome Measure   Help from another person eating meals?: Total(PEG tube) Help from another person taking care of personal grooming?: None Help from another person toileting, which includes using toliet, bedpan, or urinal?: A Little Help from another person bathing (including washing, rinsing, drying)?: A Little Help from another person to put on and taking off regular upper body clothing?: A Little Help from another person to put on and taking off regular lower body clothing?: A Little 6 Click Score: 17    End of Session    OT Visit Diagnosis: Unsteadiness on feet (R26.81);Repeated falls (R29.6);Muscle weakness (generalized) (M62.81)   Activity Tolerance Patient tolerated treatment well   Patient Left in bed;with call bell/phone within reach;with bed alarm set   Nurse Communication Other (comment)(pt urinated, back in bed, pain, and requesting to eat)        Time: 1340-1359 OT Time Calculation (min): 19 min  Charges: OT General Charges $OT Visit: 1 Visit OT Treatments $Self Care/Home Management : 8-22 mins   Jeni Salles, MPH, MS,  OTR/L ascom [phone] 02/28/18, 2:19 PM

## 2018-03-01 ENCOUNTER — Inpatient Hospital Stay: Payer: Medicare Other

## 2018-03-01 DIAGNOSIS — R0603 Acute respiratory distress: Secondary | ICD-10-CM

## 2018-03-01 LAB — GLUCOSE, CAPILLARY
Glucose-Capillary: 117 mg/dL — ABNORMAL HIGH (ref 70–99)
Glucose-Capillary: 126 mg/dL — ABNORMAL HIGH (ref 70–99)

## 2018-03-01 LAB — BASIC METABOLIC PANEL
ANION GAP: 5 (ref 5–15)
BUN: 11 mg/dL (ref 8–23)
CHLORIDE: 100 mmol/L (ref 98–111)
CO2: 33 mmol/L — AB (ref 22–32)
CREATININE: 0.94 mg/dL (ref 0.61–1.24)
Calcium: 7.8 mg/dL — ABNORMAL LOW (ref 8.9–10.3)
GFR calc non Af Amer: >60 mL/min (ref >60)
Glucose, Bld: 104 mg/dL — ABNORMAL HIGH (ref 70–99)
POTASSIUM: 3.5 mmol/L (ref 3.5–5.1)
SODIUM: 138 mmol/L (ref 135–145)

## 2018-03-01 LAB — MRSA PCR SCREENING: MRSA by PCR: POSITIVE — AB

## 2018-03-01 LAB — PHOSPHORUS: PHOSPHORUS: 2.1 mg/dL — AB (ref 2.5–4.6)

## 2018-03-01 LAB — MAGNESIUM: Magnesium: 1.7 mg/dL (ref 1.7–2.4)

## 2018-03-01 MED ORDER — AMOXICILLIN-POT CLAVULANATE 400-57 MG/5ML PO SUSR: 800.0000 mg | Freq: Two times a day (BID) | ORAL | 0 refills | Status: DC

## 2018-03-01 MED ORDER — ENOXAPARIN SODIUM 30 MG/0.3ML ~~LOC~~ SOLN
30.0000 mg | SUBCUTANEOUS | Status: DC
  Administered 2018-03-02 – 2018-03-07 (×5): 30 mg via SUBCUTANEOUS
  Filled 2018-03-01 (×5): qty 0.3

## 2018-03-01 MED ORDER — LEVOTHYROXINE SODIUM 50 MCG PO TABS
50.0000 ug | ORAL_TABLET | Freq: Every day | ORAL | Status: DC
  Administered 2018-03-03 – 2018-03-08 (×5): 50 ug
  Filled 2018-03-01 (×7): qty 1

## 2018-03-01 MED ORDER — DOCUSATE SODIUM 50 MG/5ML PO LIQD
100.0000 mg | Freq: Every day | ORAL | Status: DC
  Administered 2018-03-02 – 2018-03-04 (×3): 100 mg
  Filled 2018-03-01 (×5): qty 10

## 2018-03-01 MED ORDER — OSMOLITE 1.5 CAL PO LIQD: 237.0000 mL | Freq: Every day | ORAL | 0 refills | Status: DC

## 2018-03-01 MED ORDER — POLYETHYLENE GLYCOL 3350 17 G PO PACK: 17.0000 g | PACK | Freq: Once | ORAL | Status: DC

## 2018-03-01 MED ORDER — METOCLOPRAMIDE HCL 5 MG/ML IJ SOLN
5.0000 mg | Freq: Three times a day (TID) | INTRAMUSCULAR | Status: AC
  Administered 2018-03-01 – 2018-03-02 (×3): 5 mg via INTRAVENOUS
  Filled 2018-03-01 (×3): qty 2

## 2018-03-01 MED ORDER — METHYLPREDNISOLONE SODIUM SUCC 125 MG IJ SOLR
60.0000 mg | INTRAMUSCULAR | Status: DC
  Administered 2018-03-01: 14:00:00 60 mg via INTRAVENOUS
  Filled 2018-03-01: qty 2

## 2018-03-01 MED ORDER — VENLAFAXINE HCL 37.5 MG PO TABS: 37.5000 mg | ORAL_TABLET | Freq: Two times a day (BID) | ORAL | Status: AC

## 2018-03-01 MED ORDER — AMLODIPINE BESYLATE 2.5 MG PO TABS: 2.5000 mg | ORAL_TABLET | Freq: Every day | ORAL | Status: DC

## 2018-03-01 MED ORDER — SODIUM CHLORIDE 0.9 % IV SOLN
1.5000 g | Freq: Four times a day (QID) | INTRAVENOUS | Status: DC
  Administered 2018-03-01 – 2018-03-05 (×15): 1.5 g via INTRAVENOUS
  Filled 2018-03-01 (×18): qty 1.5

## 2018-03-01 MED ORDER — CHLORHEXIDINE GLUCONATE CLOTH 2 % EX PADS
6.0000 | MEDICATED_PAD | Freq: Every day | CUTANEOUS | Status: AC
  Administered 2018-03-03 – 2018-03-06 (×4): 6 via TOPICAL

## 2018-03-01 MED ORDER — CLONIDINE 0.1 MG/24HR TD PTWK: 0.1000 mg | MEDICATED_PATCH | TRANSDERMAL | 12 refills | Status: AC

## 2018-03-01 MED ORDER — MUPIROCIN 2 % EX OINT
1.0000 "application " | TOPICAL_OINTMENT | Freq: Two times a day (BID) | CUTANEOUS | Status: AC
  Administered 2018-03-01 – 2018-03-06 (×10): 1 via NASAL
  Filled 2018-03-01: qty 22

## 2018-03-01 MED ORDER — SENNOSIDES 8.8 MG/5ML PO SYRP
5.0000 mL | ORAL_SOLUTION | Freq: Every day | ORAL | Status: DC
  Administered 2018-03-02 – 2018-03-04 (×3): 5 mL
  Filled 2018-03-01 (×5): qty 5

## 2018-03-01 MED ORDER — AMOXICILLIN-POT CLAVULANATE 400-57 MG/5ML PO SUSR
800.0000 mg | Freq: Two times a day (BID) | ORAL | Status: DC
  Filled 2018-03-01 (×2): qty 10

## 2018-03-01 MED ORDER — DEXTROSE-NACL 5-0.45 % IV SOLN
INTRAVENOUS | Status: DC
  Administered 2018-03-01: 17:00:00 via INTRAVENOUS

## 2018-03-01 MED ORDER — POTASSIUM & SODIUM PHOSPHATES 280-160-250 MG PO PACK
1.0000 | PACK | Freq: Three times a day (TID) | ORAL | Status: AC
  Filled 2018-03-01 (×3): qty 1

## 2018-03-01 NOTE — Discharge Summary (Signed)
[place] [facility] [place] NAME: Thomas [name]    MR#:  [id number]  DATE OF BIRTH:  1951/01/05  DATE OF ADMISSION:  02/19/2018 ADMITTING PHYSICIAN: Avel Peace Salary, MD  DATE OF DISCHARGE: No discharge date for patient encounter.  PRIMARY CARE PHYSICIAN: [place], [facility]   ADMISSION DIAGNOSIS:  Hyponatremia [E87.1] Cervical lymphadenopathy [R59.0] CKD (chronic kidney disease) [N18.9]  DISCHARGE DIAGNOSIS:  Active Problems:   Hyponatremia   Head and neck cancer (HCC)   Cervical lymphadenopathy   Poor appetite   Loss of weight   History of tongue cancer   Neck mass   Dysphagia   SECONDARY DIAGNOSIS:   Past Medical History:  Diagnosis Date  . Cancer of nasal cavities ([place])   . Enlarged prostate   . Hypertension   . Lung infection    no lung cancer  . Radiation    to neck  . Renal disorder   . Skin cancer   . Status post chemotherapy   . Tongue cancer ([place])      ADMITTING HISTORY  HISTORY OF PRESENT ILLNESS: Thomas [name]  is a 67 y.o. male with a known history per below, tongue cancer status post chemo-patient states that he does not know the specifics around whether or not his cancer was cured or whether or not he is currently undergoing treatment/does not know his cancer doctor/treated in Derm per patient, patient is poor historian, family not available, presenting to the emergency room with left neck swelling x1 month with associated weight loss, poor p.o. intake, ER work-up noted for sodium 124, chloride 88, creatinine 1.6, CT neck noted for necrotic lymphadenopathy 3.8 cm likely metastatic/postsurgical changes from neck dissection prior/maxillary sinus disease/left ICA stenosis 50%/left bronchopneumonia, chest x-ray noted for COPD changes only, patient evaluated in the emergency room, in no apparent distress, resting comfortably in bed, eating food, no evidence for hypoxia, patient states that his daughter knows his  information, patient states I do not know was going on with me, patient is now been admitted for ?  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    HOSPITAL COURSE:   67 year old male patient with history of head and neck cancer bipolar disorder, COPD, chronic kidney disease stage III currently under hospitalist service for severe malnutrition, cervical lymphadenopathy and low sodium level  - Aspiration pneumonia   On unasyn. Changed to Augmentin through PEG tube today.  -Dysphagia PEG could not be placed by IR or GI.  Surgical team help appreciated with PEG tube placement.  Started on tube feeds .  Monitor electrolytes.  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  - Head and neck cancer Oncology follow-up as outpatient  palliative care to help with goals of care discussion.  -Hyponatremia - improved  SIADH  Off Salt tablets  -Accidental fall Pelvic x-ray no evidence of fracture Physical therapy evaluation for endurance training Plan to send to [facility] once stable.  -Severe malnutrition On Tube feeds now  -COPD stable  -Tobacco cessation Counseled to quit on admission  -Alcohol abuse No withdrawals  Discharge to SNF on 03/02/2018    CONSULTS OBTAINED:  Treatment Team:  Benjamine Sprague, DO Hillary Bow, MD  DRUG ALLERGIES:  No Known Allergies  DISCHARGE MEDICATIONS:   Allergies as of 03/01/2018   No Known Allergies     Medication List    TAKE these medications   amLODipine 2.5 MG tablet Commonly known as:  NORVASC Place 1 tablet (2.5 mg total) into feeding tube daily. Start taking on:  03/02/2018   amoxicillin-clavulanate 400-57 MG/5ML suspension Commonly  known as:  AUGMENTIN Place 10 mLs (800 mg total) into feeding tube every 12 (twelve) hours.   budesonide-formoterol 80-4.5 MCG/ACT inhaler Commonly known as:  SYMBICORT Inhale 2 puffs into the lungs 2 (two) times daily.   clonazePAM 1 MG tablet Commonly known as:  KLONOPIN Take 1 mg by mouth 3 (three) times daily as needed for anxiety.   cloNIDine 0.1 mg/24hr patch Commonly known as:  CATAPRES - Dosed in mg/24 hr Place 1 patch (0.1 mg total) onto the skin once a week. Start taking on:  03/05/2018   DULoxetine 30 MG capsule Commonly known as:  CYMBALTA Take 30 mg by mouth daily.   feeding supplement (OSMOLITE 1.5 CAL) Liqd Place 237 mLs into feeding tube 5 (five) times daily.   fluticasone 50 MCG/ACT nasal spray Commonly known as:  FLONASE Place 1 spray into both nostrils daily.   levothyroxine 50 MCG tablet Commonly known as:  SYNTHROID, LEVOTHROID Take 1 tablet (50 mcg total) by mouth daily before breakfast.   venlafaxine 37.5 MG tablet Commonly known as:  EFFEXOR Place 1 tablet (37.5 mg total) into feeding tube 2 (two) times daily with a meal.       Today   VITAL SIGNS:  Blood pressure 136/79, pulse (!) 125, temperature 98.2 F (36.8 C), temperature source Oral, resp. rate 19, height 5\' 7"  (1.702 m), weight 49.9 kg (110 lb), SpO2 91 %.  I/O:    Intake/Output Summary (Last 24 hours) at 03/01/2018 1318 Last data filed at 03/01/2018 0726 Gross per 24 hour  Intake 2020.33 ml  Output 100 ml  Net 1920.33 ml    PHYSICAL EXAMINATION:  Physical Exam  GENERAL:  67 y.o.-year-old patient lying in the bed with no acute distress.  LUNGS: b/l coarse breath sounds CARDIOVASCULAR: S1, S2 normal. No murmurs, rubs, or gallops.  ABDOMEN: Soft, non-tender, non-distended. Bowel sounds present. No organomegaly or mass. PEG tube in place  NEUROLOGIC: Moves all 4 extremities. PSYCHIATRIC: The patient is alert and oriented x 3.  SKIN: No obvious rash, lesion, or ulcer.   DATA  REVIEW:   CBC Recent Labs  Lab 02/26/18 0539  WBC 9.5  HGB 10.3*  HCT 29.9*  PLT 274    Chemistries  Recent Labs  Lab 03/01/18 0501  NA 138  K 3.5  CL 100  CO2 33*  GLUCOSE 104*  BUN 11  CREATININE 0.94  CALCIUM 7.8*  MG 1.7    Cardiac Enzymes No results for input(s): TROPONINI in the last 168 hours.  Microbiology Results  Results for orders placed or performed during the hospital encounter of 02/19/18  Culture, blood (routine x 2) Call MD if unable to obtain prior to antibiotics being given     Status: None   Collection Time: 02/19/18  1:08 PM  Result Value Ref Range Status   Specimen Description BLOOD RIGHT ANTECUBITAL  Final   Special Requests   Final    BOTTLES DRAWN AEROBIC AND ANAEROBIC Blood Culture adequate volume   Culture   Final    NO GROWTH 5 DAYS Performed at [facility], [address] [place], [place] [address]    Report  Status 02/24/2018 FINAL  Final  Culture, blood (routine x 2) Call MD if unable to obtain prior to antibiotics being given     Status: None   Collection Time: 02/19/18  1:19 PM  Result Value Ref Range Status   Specimen Description BLOOD LEFT ANTECUBITAL  Final   Special Requests   Final    BOTTLES DRAWN AEROBIC AND ANAEROBIC Blood Culture adequate volume   Culture   Final    NO GROWTH 5 DAYS Performed at [facility], [address] [place], [place] [address]    Report Status 02/24/2018 FINAL  Final    RADIOLOGY:  No results found.  Follow up with PCP in 1 week.  Management plans discussed with the patient, family and they are in agreement.  CODE STATUS:     Code Status Orders  (From admission, onward)        Start     Ordered   02/19/18 1233  Full code  Continuous     02/19/18 1232    Code Status History    Date Active Date Inactive Code Status Order ID Comments User Context   02/19/2016 1355 02/22/2016 2000 Full Code [id number]  Lytle Butte, MD ED   02/07/2016 0230 02/09/2016 1859 Full  Code [id number]  Harrie Foreman, MD Inpatient   01/22/2016 0552 01/26/2016 1805 Full Code [id number]  Harrie Foreman, MD Inpatient   08/17/2015 2229 08/21/2015 1800 Full Code [id number]  Lance Coon, MD Inpatient   12/12/2014 0119 12/18/2014 1659 Full Code [id number]  Hower, Aaron Mose, MD ED      TOTAL TIME TAKING CARE OF THIS PATIENT ON DAY OF DISCHARGE: more than 30 minutes.   [name] Chidubem Chaires M.D on 03/01/2018 at 1:18 PM  Between 7am to 6pm - Pager - [phone]  After 6pm go to [email or web address] - password [email or web address] [facility]  Office  [phone]  CC: Primary care physician; [facility]  Note: This dictation was prepared with Dragon dictation along with smaller phrase technology. Any transcriptional errors that result from this process are unintentional.

## 2018-03-01 NOTE — Progress Notes (Signed)
Anticoagulation monitoring(Lovenox):  66yo  F ordered Lovenox 40 mg Q24h  Filed Weights   02/19/18 1243 02/20/18 1624 02/27/18 0902  Weight: 107 lb (48.5 kg) 113 lb 1.5 oz (51.3 kg) 110 lb (49.9 kg)   BMI 17.22   Lab Results  Component Value Date   CREATININE 0.94 03/01/2018   CREATININE 0.90 02/27/2018   CREATININE 1.03 02/25/2018   Estimated Creatinine Clearance: 53.8 mL/min (by C-G formula based on SCr of 0.94 mg/dL). Hemoglobin & Hematocrit     Component Value Date/Time   HGB 10.3 (L) 02/26/2018 0539   HGB 13.4 02/20/2013 0437   HCT 29.9 (L) 02/26/2018 0539   HCT 38.7 (L) 02/20/2013 9563     Per Protocol for Patient with estCrcl > 30 ml/min and weight in male < 57 kg will transition to Lovenox 30 mg Q24h      Chinita Greenland PharmD Clinical Pharmacist 03/01/2018

## 2018-03-01 NOTE — Consult Note (Signed)
Reason for Consult:respiratory distress Referring Physician: Dr. Jacklynn [name] is an 67 y.o. male.  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 Course has been complicated by hyponatremia, has been seen by nephrology, hypothyroidism, started on Synthroid, left neck ultrasound guided needle aspiration shows recurrent squamous cell carcinoma being followed by oncology, . Oral nutrition, status post laparoscopic G-tube placement, call today to see patient secondary to respiratory distress, gurgling on secretions with possible aspiration. Presently on a nonrebreathing mask  Past Medical History:  Diagnosis Date  . Cancer of nasal cavities ([place])   . Enlarged prostate   . Hypertension   . Lung infection    no lung cancer  . Radiation    to neck  . Renal disorder   . Skin cancer   . Status post chemotherapy   . Tongue cancer [facility])     Past Surgical History:  Procedure Laterality Date  . cancerous lymph nodes removed from neck    . IR FLUORO RM 30-60 MIN  02/25/2018  . KNEE SURGERY Right   . LAPAROSCOPIC INSERTION GASTROSTOMY TUBE N/A 02/27/2018   Procedure: LAPAROSCOPIC INSERTION GASTROSTOMY TUBE;  Surgeon: Benjamine Sprague, DO;  Location: [facility] ORS;  Service: General;  Laterality: N/A;  . SKIN BIOPSY      Family History  Problem Relation Age of Onset  .  Hypertension Mother     Social History:  reports that he has been smoking cigarettes.  He has never used smokeless tobacco. He reports that he drinks alcohol. He reports that he does not use drugs.  Allergies: No Known Allergies  Medications: I have reviewed the patient's current medications.  Results for orders placed or performed during the hospital encounter of 02/19/18 (from the past 48 hour(s))  Basic metabolic panel     Status: Abnormal   Collection Time: 03/01/18  5:01 AM  Result Value Ref Range   Sodium 138 135 - 145 mmol/L   Potassium 3.5 3.5 - 5.1 mmol/L   Chloride 100 98 - 111 mmol/L   CO2 33 (H) 22 - 32 mmol/L   Glucose, Bld 104 (H) 70 - 99 mg/dL   BUN 11 8 - 23 mg/dL   Creatinine, Ser 0.94 0.61 - 1.24 mg/dL   Calcium 7.8 (L) 8.9 - 10.3 mg/dL   GFR calc non Af Amer >60 >60 mL/min   GFR calc Af Amer >60 >60 mL/min    Comment: (NOTE) The eGFR has been calculated using the CKD EPI equation. This calculation has not been validated in all clinical situations. eGFR's persistently <60 mL/min signify possible Chronic Kidney Disease.    Anion gap 5 5 - 15    Comment: Performed at [facility], [address] [place], [place] [address]  Magnesium     Status: None   Collection Time: 03/01/18  5:01 AM  Result Value Ref Range   Magnesium 1.7 1.7 - 2.4 mg/dL    Comment: Performed at  [facility], [address], [place], [place] [address]  Phosphorus     Status: Abnormal   Collection Time: 03/01/18  5:01 AM  Result Value Ref Range   Phosphorus 2.1 (L) 2.5 - 4.6 mg/dL    Comment: Performed at [facility], [address], [place], [place] [address]    Dg Chest Port 1 View  Result Date: 03/01/2018 CLINICAL DATA:  Concern for aspiration. History of nasal region carcinoma EXAM: PORTABLE CHEST 1 VIEW COMPARISON:  February 25, 2018. FINDINGS: There is patchy airspace opacity in the lung bases, slightly more on the left than on the right, and in the  right upper lobe. Lungs elsewhere are clear. Heart size and pulmonary vascular normal. No adenopathy. There is aortic atherosclerosis. No evident bone lesions. Several small metallic fragments are noted on the left. IMPRESSION: Patchy infiltrate in the lung bases, slightly more on the left than on the right as well as in the right upper lobe. Question areas of pneumonia versus aspiration. Both entities may present concurrently. Lungs elsewhere clear. Heart size normal. No adenopathy. There is aortic atherosclerosis. Aortic Atherosclerosis (ICD10-I70.0). Electronically Signed   By: Lowella Grip III M.D.   On: 03/01/2018 13:25    ROS Blood pressure 136/79, pulse (!) 125, temperature 98.2 F (36.8 C), temperature source Oral, resp. rate 19, height 5' 7"  (1.702 m), weight 110 lb (49.9 kg), SpO2 91 %. Physical Exam  Cachectic patient, on 100% nonrebreather mask, is tachycardic, oxygen saturations are 100%,excessive secretions with gurgling noted Vital signs: Please see the above listed vital signs HEENT: Left neck swelling, partial rhinectomy, central secretions appreciated Pulmonary: Gurgling on central secretions, rhonchi and rales appreciated diffusely Abdominal: G-tube in place, soft exam Extremities: No clubbing cyanosis or edema noted Neurologic: Limited exam, patient is awake and responsive, moves extremities Cutaneous: No rashes or lesions noted  Assessment/Plan:  Recurrent head and neck cancer, prior history of 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 History of COPD and dysphagia.Status post laparoscopic G-tube placement for nutritional support. Call to see patient secondary to increasing respiratory distress, central secretions to be moved to the intensive care unit for close observation  and monitoring. Patient is a full code and may require intubation  Hyponatremia. Resolved  Hypophosphatemia. Replacement  Anemia. No evidence of active bleeding  Severe malnutrition. Status post lap appendectomy G-tube placement   Critical care time 40 minutes   Thomas [name] 03/01/2018, 2:32 PM

## 2018-03-01 NOTE — Care Management Important Message (Signed)
Important Message  Patient Details  Name: Thomas [name] MRN: [id number] Date of Birth: 1950-12-09   Medicare Important Message Given:  Yes    Juliann Pulse A Dimetri Armitage 03/01/2018, 2:52 PM

## 2018-03-01 NOTE — Progress Notes (Signed)
Nutrition Brief Note  RD received call from bedside RN about patient's TF and emesis. Recommend continue reglan and see if patient can tolerate bolus feeds of 1/2 Can TID tomorrow. If he is unable to tolerate, he can be switched continuous feeds at 26mL/hr.  Satira Anis. Keshonda Monsour, MS, RD LDN Inpatient Clinical Dietitian Pager [phone]

## 2018-03-01 NOTE — Progress Notes (Signed)
Discussed with Dr. Jefferson Fuel. No improvement and still hypoxic and tachycardia. Transfer to stepdown

## 2018-03-01 NOTE — Progress Notes (Signed)
Pt transferred to ICU, report given at bedside to [facility]

## 2018-03-01 NOTE — Progress Notes (Addendum)
Per RD Will, we will hold feeds until tomorrow AM, and give Reglan time to work. Pt is currently on 4 liters , BS rhonchorous, audible gurgling, but O2 sats in 90s.  Pt can answer limited questions.   Suctioning limited to oral per Dr Jefferson Fuel d/t hx head/neck CA

## 2018-03-01 NOTE — Progress Notes (Signed)
Speech Therapy note: reviewed chart notes; consulted NSG who reported intermittent toleration of the TFs - pt is s/p lap g tube placement. Pt also c/o significant neck pain for which he receives pain meds for as well. He has poor recall of information re: his TFs and medicines; he requires verbal cues for follow through w/ all tasks. He has poor energy/stamina and appears fatigued.  Pt would be a candidate for STR where he could be followed for dysphagia tx to hopefully improve pharyngeal phase of swallowing - suspect much of his deficit is related to prior oral ca and effects of XRT/tx(see MBSS). Pt is too weak at this time for meaningful dysphagia tx. Recommend frequent oral care for hygiene and stimulation of swallowing - aspiration precautions during oral care. NSG updated.     Orinda Kenner, [place], CCC-SLP

## 2018-03-01 NOTE — Progress Notes (Signed)
[place] at [place] NAME: Thomas [name]    MR#:  [id number]  DATE OF BIRTH:  [birth year]  SUBJECTIVE:  CHIEF COMPLAINT:   Chief Complaint  Patient presents with  . Neck Pain   Tube feeds being advanced slowly.  On 2 L oxygen.  Afebrile.  REVIEW OF SYSTEMS:    ROS  CONSTITUTIONAL: No documented fever. Has fatigue, weakness. No weight gain, has weight loss.  EYES: No blurry or double vision.  ENT: No tinnitus. No postnasal drip. No redness of the oropharynx.  Has difficulty swallowing food RESPIRATORY: No cough, no wheeze, no hemoptysis. No dyspnea.  CARDIOVASCULAR: No chest pain. No orthopnea. No palpitations. No syncope.  GASTROINTESTINAL: No nausea, no vomiting or diarrhea. No abdominal pain. No melena or hematochezia.  GENITOURINARY: No dysuria or hematuria.  ENDOCRINE: No polyuria or nocturia. No heat or cold intolerance.  HEMATOLOGY: No anemia. No bruising. No bleeding.  INTEGUMENTARY: No rashes. No lesions.  MUSCULOSKELETAL: No arthritis. No swelling. No gout.  NEUROLOGIC: No numbness, tingling, or ataxia. No seizure-type activity.  PSYCHIATRIC: No anxiety. No insomnia. No ADD.   DRUG ALLERGIES:  No Known Allergies  VITALS:  Blood pressure 136/79, pulse 83, temperature 98.2 F (36.8 C), temperature source Oral, resp. rate 19, height 5\' 7"  (1.702 m), weight 49.9 kg (110 lb), SpO2 95 %.  PHYSICAL EXAMINATION:   Physical Exam  GENERAL:  67 y.o.-year-old cachectic male patient lying in the bed with no acute distress.  EYES: Pupils equal, round, reactive to light and accommodation. No scleral icterus. Extraocular muscles intact.  HEENT: Head atraumatic, normocephalic. Oropharynx and nasopharynx clear.  Cervical lymphadenopathy NECK:  Supple, no jugular venous distention. No thyroid enlargement, no tenderness.  LUNGS: Normal breath sounds bilaterally, no wheezing, rales, rhonchi. No use of accessory muscles of respiration.   CARDIOVASCULAR: S1, S2 normal. No murmurs, rubs, or gallops.  ABDOMEN: Soft, nontender, nondistended. Bowel sounds present. No organomegaly or mass. PEG in place. EXTREMITIES: No cyanosis, clubbing or edema b/l.    NEUROLOGIC: Cranial nerves II through XII are intact. No focal Motor or sensory deficits b/l.  Generalized weakness. PSYCHIATRIC: The patient is alert and oriented x 3.  SKIN: No obvious rash, lesion, or ulcer.   LABORATORY PANEL:   CBC Recent Labs  Lab 02/26/18 0539  WBC 9.5  HGB 10.3*  HCT 29.9*  PLT 274   ------------------------------------------------------------------------------------------------------------------ Chemistries  Recent Labs  Lab 03/01/18 0501  NA 138  K 3.5  CL 100  CO2 33*  GLUCOSE 104*  BUN 11  CREATININE 0.94  CALCIUM 7.8*  MG 1.7   ------------------------------------------------------------------------------------------------------------------  Cardiac Enzymes No results for input(s): TROPONINI in the last 168 hours. ------------------------------------------------------------------------------------------------------------------  RADIOLOGY:  No results found.   ASSESSMENT AND PLAN:  67 year old male patient with history of head and neck cancer bipolar disorder, COPD, chronic kidney disease stage III currently under hospitalist service for severe malnutrition, cervical lymphadenopathy and low sodium level  - Aspiration pneumonia   On unasyn. Changed to Augmentin through PEG tube today.  -Dysphagia PEG could not be placed by IR or GI.  Surgical team help appreciated with PEG tube placement.  Started on tube feeds .  Monitor electrolytes.  Discharge to skilled nursing facility tomorrow  -Cervical lymphadenopathy/mass S/p CT guided neck biopsy ,  Path results revealed metastatic squamous cell carcinoma Follow-up with cancer center after starting adequate diet for further management.  - Head and neck cancer Oncology  follow-up as outpatient  palliative care to help with goals of care discussion.  -Hyponatremia - improved  SIADH  Off Salt tablets  -Accidental fall Pelvic x-ray no evidence of fracture Physical therapy evaluation for endurance training Plan to send to [facility] once stable.  -Severe malnutrition Nutritional supplements Start feeding via tube tomorrow.  -COPD stable  -Tobacco cessation Counseled to quit on admission  -Alcohol abuse Counseled on admission On thiamine supplements   All the records are reviewed and case discussed with Care Management/Social Worker. Management plans discussed with the patient, family and they are in agreement.  CODE STATUS: Full code.  DVT Prophylaxis: SCDs  TOTAL TIME TAKING CARE OF THIS PATIENT: 35 minutes.  Discussed with patient's daughter on the phone.  POSSIBLE D/C IN 2 to 3 DAYS, DEPENDING ON CLINICAL CONDITION.  Thomas [name] M.D on 03/01/2018 at 11:26 AM  Between 7am to 6pm - Pager - [phone] After 6pm go to [email or web address] - password [email or web address] [facility]  Office  [phone]  CC: Primary care physician; [facility]  Note: This dictation was prepared with Dragon dictation along with smaller phrase technology. Any transcriptional errors that result from this process are unintentional.

## 2018-03-01 NOTE — Progress Notes (Signed)
OT Cancellation Note  Patient Details Name: [name] MRN: [id number] DOB: Jul 16, 1951   Cancelled Treatment:    Reason Eval/Treat Not Completed: Medical issues which prohibited therapy. Chart reviewed. Pt transferred to ICU for respiratory distress and possible aspiration. Per therapy protocol due to pt's change in status with transfer to ICU, will require new OT order to continue therapy. Will complete current order. Please re-consult if/when pt is medically appropriate.   Jeni Salles, MPH, MS, OTR/L ascom [phone] 03/01/18, 3:46 PM

## 2018-03-01 NOTE — Progress Notes (Signed)
Paged by Estill Bamberg RN regarding patient having an episode of vomiting and acute deterioration of respiratory status.  Patient had tube feeds 2 hours prior.  On arrival patient has gurgling sound and is being suctioned by respiratory therapist.  Saturations 97% on nonrebreather.  Patient is awake and coughing. Bilateral coarse breath sounds. S1, S2.  Tachycardia  *Acute episode of aspiration with acutely worsening respiratory status.  Deep suctioning.  Encourage patient to cough.  Switch to nasal cannula at 6 L and saturations 97%.  We will repeat a stat chest x-ray.  Nebulizers.  If no improvement patient will be transferred to stepdown.  CRITICAL CARE Time spent 35 minutes
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
## 2018-03-01 NOTE — Progress Notes (Signed)
Pt o2 sats decreasing to 84-87% on 5L [place], HR increasing to 135-140s, Dr Darvin Neighbours made aware and RT aware, RT placed pt back on non rebreather, per sudini pt with transfer to ICU

## 2018-03-01 NOTE — Progress Notes (Signed)
NT reports that pt had vomited, upon entering room pt noted with bed in fully upright position with small amount of emesis color of tube feeding noted on his chin and gown,  also noted to be gurgling, suctioned with yanker, o2 sats in 65% on 2L [place], HR 134, pt placed on non re breather, o2 sats came up to 94% on non re breather, pt less alert than this morning,  Dr Darvin Neighbours made aware, RT made aware, per sudini notify respiratory to deep suction pt, RT deep suctioned pt, Dr Darvin Neighbours arrived to pts room shortly after, RT deep suctioned and placed pt back on [place] at 5L o2 sats 95% HR 121

## 2018-03-02 LAB — BASIC METABOLIC PANEL
ANION GAP: 3 — AB (ref 5–15)
BUN: 11 mg/dL (ref 8–23)
CALCIUM: 7.6 mg/dL — AB (ref 8.9–10.3)
CO2: 34 mmol/L — ABNORMAL HIGH (ref 22–32)
Chloride: 99 mmol/L (ref 98–111)
Creatinine, Ser: 0.92 mg/dL (ref 0.61–1.24)
Glucose, Bld: 138 mg/dL — ABNORMAL HIGH (ref 70–99)
POTASSIUM: 3.7 mmol/L (ref 3.5–5.1)
SODIUM: 136 mmol/L (ref 135–145)

## 2018-03-02 LAB — CBC WITH DIFFERENTIAL/PLATELET
BASOS ABS: 0 10*3/uL (ref 0–0.1)
BASOS PCT: 0 %
EOS PCT: 0 %
Eosinophils Absolute: 0 10*3/uL (ref 0–0.7)
HCT: 26.9 % — ABNORMAL LOW (ref 40.0–52.0)
Hemoglobin: 8.8 g/dL — ABNORMAL LOW (ref 13.0–18.0)
Lymphocytes Relative: 2 %
Lymphs Abs: 0.3 10*3/uL — ABNORMAL LOW (ref 1.0–3.6)
MCH: 28.6 pg (ref 26.0–34.0)
MCHC: 32.8 g/dL (ref 32.0–36.0)
MCV: 87.2 fL (ref 80.0–100.0)
MONO ABS: 0.5 10*3/uL (ref 0.2–1.0)
Monocytes Relative: 3 %
Neutro Abs: 16.2 10*3/uL — ABNORMAL HIGH (ref 1.4–6.5)
Neutrophils Relative %: 95 %
PLATELETS: 236 10*3/uL (ref 150–440)
RBC: 3.08 MIL/uL — AB (ref 4.40–5.90)
RDW: 15.7 % — AB (ref 11.5–14.5)
WBC: 17 10*3/uL — ABNORMAL HIGH (ref 3.8–10.6)

## 2018-03-02 LAB — GLUCOSE, CAPILLARY
GLUCOSE-CAPILLARY: 118 mg/dL — AB (ref 70–99)
GLUCOSE-CAPILLARY: 136 mg/dL — AB (ref 70–99)
GLUCOSE-CAPILLARY: 113 mg/dL — AB (ref 70–99)
Glucose-Capillary: 116 mg/dL — ABNORMAL HIGH (ref 70–99)

## 2018-03-02 MED ORDER — AMIODARONE HCL IN DEXTROSE 360-4.14 MG/200ML-% IV SOLN
30.0000 mg/h | INTRAVENOUS | Status: DC
  Administered 2018-03-02 – 2018-03-03 (×2): 30 mg/h via INTRAVENOUS
  Filled 2018-03-02 (×2): qty 200

## 2018-03-02 MED ORDER — METOPROLOL TARTRATE 5 MG/5ML IV SOLN
5.0000 mg | INTRAVENOUS | Status: AC
  Administered 2018-03-02: 5 mg via INTRAVENOUS

## 2018-03-02 MED ORDER — FREE WATER
180.0000 mL | Freq: Three times a day (TID) | Status: DC
  Administered 2018-03-02 – 2018-03-03 (×3): 180 mL

## 2018-03-02 MED ORDER — AMIODARONE LOAD VIA INFUSION
150.0000 mg | Freq: Once | INTRAVENOUS | Status: DC
  Filled 2018-03-02: qty 83.34

## 2018-03-02 MED ORDER — OSMOLITE 1.5 CAL PO LIQD
118.0000 mL | Freq: Three times a day (TID) | ORAL | Status: DC
  Administered 2018-03-02 – 2018-03-03 (×3): 118 mL

## 2018-03-02 MED ORDER — AMIODARONE HCL IN DEXTROSE 360-4.14 MG/200ML-% IV SOLN
60.0000 mg/h | INTRAVENOUS | Status: AC
  Administered 2018-03-02: 60 mg/h via INTRAVENOUS
  Filled 2018-03-02: qty 200

## 2018-03-02 MED ORDER — AMIODARONE IV BOLUS ONLY 150 MG/100ML
INTRAVENOUS | Status: AC
  Administered 2018-03-02: 150 mg
  Filled 2018-03-02: qty 100

## 2018-03-02 MED ORDER — METOPROLOL TARTRATE 5 MG/5ML IV SOLN
INTRAVENOUS | Status: AC
  Administered 2018-03-02: 5 mg via INTRAVENOUS
  Filled 2018-03-02: qty 5

## 2018-03-02 NOTE — Progress Notes (Signed)
Report given to [facility], Therapist, sports. Pt. Resting in stable condition

## 2018-03-02 NOTE — Progress Notes (Signed)
MEDICATION RELATED CONSULT NOTE - INITIAL   Pharmacy Consult for amiodarone interactions Indication: amiodarone ddi   No Known Allergies  Patient Measurements: Height: 5\' 7"  (170.2 cm) Weight: 110 lb (49.9 kg) IBW/kg (Calculated) : 66.1 Adjusted Body Weight:   Vital Signs: Temp: 98.3 F (36.8 C) (07/27 1200) Temp Source: Axillary (07/27 1200) BP: 128/64 (07/27 1400) Pulse Rate: 90 (07/27 1400) Intake/Output from previous day: 07/26 0701 - 07/27 0700 In: 717.5 [I.V.:347.5; IV Piggyback:370] Out: 200 [Urine:200] Intake/Output from this shift: Total I/O In: 1248.2 [I.V.:652.2; NG/GT:596] Out: 320 [Urine:320]  Labs: Recent Labs    03/01/18 0501 03/02/18 0430  WBC  --  17.0*  HGB  --  8.8*  HCT  --  26.9*  PLT  --  236  CREATININE 0.94 0.92  MG 1.7  --   PHOS 2.1*  --    Estimated Creatinine Clearance: 55 mL/min (by C-G formula based on SCr of 0.92 mg/dL).   Microbiology: Recent Results (from the past 720 hour(s))  Culture, blood (routine x 2) Call MD if unable to obtain prior to antibiotics being given     Status: None   Collection Time: 02/19/18  1:08 PM  Result Value Ref Range Status   Specimen Description BLOOD RIGHT ANTECUBITAL  Final   Special Requests   Final    BOTTLES DRAWN AEROBIC AND ANAEROBIC Blood Culture adequate volume   Culture   Final    NO GROWTH 5 DAYS Performed at [facility], [address] [place], [place] [address]    Report Status 02/24/2018 FINAL  Final  Culture, blood (routine x 2) Call MD if unable to obtain prior to antibiotics being given     Status: None   Collection Time: 02/19/18  1:19 PM  Result Value Ref Range Status   Specimen Description BLOOD LEFT ANTECUBITAL  Final   Special Requests   Final    BOTTLES DRAWN AEROBIC AND ANAEROBIC Blood Culture adequate volume   Culture   Final    NO GROWTH 5 DAYS Performed at [facility] [address] [place], [place] [address]    Report Status 02/24/2018  FINAL  Final  MRSA PCR Screening     Status: Abnormal   Collection Time: 03/01/18  3:41 PM  Result Value Ref Range Status   MRSA by PCR POSITIVE (A) NEGATIVE Final    Comment:        The GeneXpert MRSA Assay (FDA approved for NASAL specimens only), is one component of a comprehensive MRSA colonization surveillance program. It is not intended to diagnose MRSA infection nor to guide or monitor treatment for MRSA infections. RESULT CALLED TO, READ BACK BY AND VERIFIED WITH: CHERYL GREEN 03/01/18 AT 1730 BY HS Performed at [facility], [address], [place], [place] [address]     Medical History: Past Medical History:  Diagnosis Date  . 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 Enlarged prostate   . Hypertension   . Lung infection    no lung cancer  . Radiation    to neck  . Renal disorder   . Skin cancer   . Status post chemotherapy   . Tongue cancer ([place])     Medications:  Medications Prior to Admission  Medication Sig Dispense Refill Last Dose  . budesonide-formoterol (SYMBICORT) 80-4.5 MCG/ACT inhaler Inhale 2 puffs into the lungs 2 (two) times daily.   Not Taking at Unknown time  . clonazePAM (KLONOPIN) 1 MG tablet Take 1 mg  by mouth 3 (three) times daily as needed for anxiety.   Not Taking at Unknown time  . DULoxetine (CYMBALTA) 30 MG capsule Take 30 mg by mouth daily.   Not Taking at Unknown time  . fluticasone (FLONASE) 50 MCG/ACT nasal spray Place 1 spray into both nostrils daily.   Not Taking at Unknown time  . levothyroxine (SYNTHROID, LEVOTHROID) 50 MCG tablet Take 1 tablet (50 mcg total) by mouth daily before breakfast. (Patient not taking: Reported on 02/19/2018) 30 tablet 2 Not Taking at Unknown time   Scheduled:  . amiodarone  150 mg Intravenous Once  . amLODipine  2.5 mg Per Tube Daily  . Chlorhexidine Gluconate Cloth  6 each Topical Q0600  . cloNIDine  0.1 mg Transdermal Weekly  . sennosides  5 mL Per Tube Daily   And  . docusate  100 mg Per  Tube Daily  . enoxaparin (LOVENOX) injection  30 mg Subcutaneous Q24H  . feeding supplement (OSMOLITE 1.5 CAL)  118 mL Per Tube TID  . fentaNYL  12.5 mcg Transdermal Q72H  . fluticasone  1 spray Each Nare Daily  . folic acid  1 mg Oral Daily  . free water  180 mL Per Tube TID  . levothyroxine  50 mcg Per Tube QAC breakfast  . mometasone-formoterol  2 puff Inhalation BID  . multivitamin with minerals  1 tablet Oral Daily  . mupirocin ointment  1 application Nasal BID  . nicotine  14 mg Transdermal Daily  . polyethylene glycol  17 g Oral Once  . thiamine  100 mg Oral Daily   Or  . thiamine  100 mg Intravenous Daily  . venlafaxine  37.5 mg Per Tube BID WC    Assessment: Pharmacy consulted to assess medication list for possible DDI with amiodarone.   Goal of Therapy:    Plan:  No interactions present at this time.   Kingdom Vanzanten D 03/02/2018,2:26 PM

## 2018-03-02 NOTE — Progress Notes (Signed)
Follow up - Critical Care Medicine Note  Patient Details:    Thomas [name] is an 67 y.o. 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 Course has been complicated by hyponatremia, has been seen by nephrology, hypothyroidism, started on Synthroid, left neck ultrasound guided needle aspiration shows recurrent squamous cell carcinoma being followed by oncology, . Oral nutrition, status post laparoscopic G-tube placement, was moved to the ICU yesterday secondary to difficulty managing secretions and respiratory distress.  Lines, Airways, Drains: Gastrostomy/Enterostomy Gastrostomy 16 Fr. Other (comment) (Active)  Surrounding Skin Dry;Intact 03/02/2018  2:00 AM  Tube Status Clamped 03/02/2018  2:00 AM  Drainage Appearance None 03/02/2018  2:00 AM  Dressing Status Intact;Clean;Dry 03/02/2018  2:00 AM  Dressing Intervention Dressing changed 03/02/2018  2:00 AM  Dressing Type Split gauze 03/02/2018  2:00 AM  Dressing Change Due 03/04/18 03/02/2018  2:00 AM     External Urinary Catheter (Active)  Collection Container Standard drainage bag 03/02/2018  2:00 AM  Securement Method Securing device (Describe) 03/02/2018  2:00 AM  Output (mL) 75 mL 03/02/2018 12:00 AM    Anti-infectives:  Anti-infectives (From admission, onward)   Start     Dose/Rate Route Frequency Ordered Stop   03/01/18 1700  ampicillin-sulbactam  (UNASYN) 1.5 g in sodium chloride 0.9 % 100 mL IVPB     1.5 g 200 mL/hr over 30 Minutes Intravenous Every 6 hours 03/01/18 1636     03/01/18 1130  amoxicillin-clavulanate (AUGMENTIN) 400-57 MG/5ML suspension 800 mg  Status:  Discontinued     800 mg Per Tube Every 12 hours 03/01/18 1128 03/01/18 1636   03/01/18 0000  amoxicillin-clavulanate (AUGMENTIN) 400-57 MG/5ML suspension     800 mg Per Tube Every 12 hours 03/01/18 1322     02/25/18 1600  ampicillin-sulbactam (UNASYN) 1.5 g in sodium chloride 0.9 % 100 mL IVPB  Status:  Discontinued     1.5 g 200 mL/hr over 30 Minutes Intravenous Every 6 hours 02/25/18 1455 03/01/18 1128   02/19/18 1400  metroNIDAZOLE (FLAGYL) tablet 500 mg  Status:  Discontinued     500 mg Oral Every 8 hours 02/19/18 1038 02/20/18 1140   02/19/18 1045  amoxicillin-clavulanate (AUGMENTIN) 875-125 MG per tablet 1 tablet  Status:  Discontinued     1 tablet Oral Every 12 hours 02/19/18 1038 02/20/18 1140      Microbiology: Results for orders placed or performed during the hospital encounter of 02/19/18  Culture, blood (routine x 2) Call MD if unable to obtain prior to antibiotics being given     Status: None   Collection Time: 02/19/18  1:08 PM  Result Value Ref Range Status   Specimen Description BLOOD RIGHT ANTECUBITAL  Final   Special Requests   Final    BOTTLES DRAWN AEROBIC AND ANAEROBIC Blood Culture adequate volume   Culture   Final    NO GROWTH 5 DAYS Performed at [facility], [address], [place],  [address]    Report Status 02/24/2018  FINAL  Final  Culture, blood (routine x 2) Call MD if unable to obtain prior to antibiotics being given     Status: None   Collection Time: 02/19/18  1:19 PM  Result Value Ref Range Status   Specimen Description BLOOD LEFT ANTECUBITAL  Final   Special Requests   Final    BOTTLES DRAWN AEROBIC AND ANAEROBIC Blood Culture adequate volume   Culture   Final    NO GROWTH 5 DAYS Performed at [facility], [address] [place], [place] [address]    Report Status 02/24/2018 FINAL  Final  MRSA PCR Screening     Status: Abnormal   Collection Time: 03/01/18  3:41 PM  Result Value Ref Range Status   MRSA by PCR POSITIVE (A) NEGATIVE Final    Comment:        The GeneXpert MRSA Assay (FDA approved for NASAL specimens only), is one component of a comprehensive MRSA colonization surveillance program. It is not intended to diagnose MRSA infection nor to guide or monitor treatment for MRSA infections. RESULT CALLED TO, READ BACK BY AND VERIFIED WITH: CHERYL GREEN 03/01/18 AT 1730 BY HS Performed at [facility], [address] [place], [place] [address]      Studies: Ct Abdomen Wo Contrast  Result Date: 02/25/2018 CLINICAL DATA:  Evaluate anatomy prior to potential percutaneous gastrostomy tube placement. EXAM: CT ABDOMEN WITHOUT CONTRAST TECHNIQUE: Multidetector CT imaging of the abdomen was performed following the standard protocol without IV contrast. COMPARISON:  CT abdomen pelvis-10/25/2010; chest radiograph earlier same day FINDINGS: The lack of intravenous contrast limits the ability to evaluate solid abdominal organs. Lower chest: Limited visualization of the lower thorax demonstrates trace bilateral effusions with consolidative airspace opacities within the imaged left lower lobe. Normal heart size. Coronary artery calcifications. There is diffuse decreased attenuation intra cardiac blood pool suggestive of anemia. Trace amount of pericardial fluid, presumably physiologic. Hepatobiliary: Normal hepatic contour. Layering material within the gallbladder may represent gallstones or biliary sludge (image 28, series 2). No definitive gallbladder wall thickening or pericholecystic fluid on this noncontrast examination. No definite ascites. Pancreas: Normal non-contrast appearance of pancreas. Spleen: Normal noncontrast appearance of the spleen Adrenals/Urinary Tract:  There is an approximately 0.8 x 0.5 cm nonobstructing stone within the interpolar aspect the left kidney (image 25, series 2). No definite right-sided renal stones. No renal stones are seen along the superior aspect of either ureter. Normal noncontrast appearance of the bilateral adrenal glands. The urinary bladder is not imaged. Stomach/Bowel: The transverse colon is interposed between the anterior wall the stomach and ventral wall of the upper abdomen. Ingested barium from previous swallowing examination is seen within the transverse colon. Moderate gaseous distention of the colon. No pneumoperitoneum, pneumatosis or portal venous gas. Vascular/Lymphatic: Vascular calcifications within a normal size abdominal aorta. No bulky retroperitoneal mesenteric adenopathy on this noncontrast examination. Other: Minimal amount of subcutaneous edema the midline of the low back. Musculoskeletal: No acute or aggressive osseous abnormalities. Unchanged limbus body involving the anterior superior aspect of the L4 vertebral body. Mild-to-moderate multilevel lumbar spine DDD, worse at T11-T12, T12-L1 and L4-L5 with disc space height loss endplate irregularity and sclerosis. Bilateral facet degenerative change the lower lumbar spine. IMPRESSION: 1. Transverse colon is interposed between the anterior wall of the stomach and ventral wall of the upper abdomen, rendering percutaneous gastrostomy tube challenging. 2. Trace pleural effusions with consolidative opacities within the imaged left lung base worrisome for infection and/or aspiration. 3. Moderate gaseous  distention of the colon without evidence of enteric obstruction. 4. Potential cholelithiasis without findings of acute cholecystitis on this noncontrast examination. 5. Approximately 0.8 cm nonobstructing left-sided renal stone. 6.  Aortic Atherosclerosis (ICD10-I70.0). Electronically Signed   By: Sandi Mariscal M.D.   On: 02/25/2018 09:16   Dg Chest 2 View  Result Date:  02/25/2018 CLINICAL DATA:  Fever, weakness, history of COPD, nasopharygeal malignancy. EXAM: CHEST - 2 VIEW COMPARISON:  PA and lateral chest x-ray of February 19, 2018 FINDINGS: The lungs remain hyperinflated. There is hazy increased density at the left lung base. There small bilateral pleural effusions layering posteriorly which are new. The heart and pulmonary vascularity are normal. There is calcification in the wall of the aortic arch. There is gaseous distention of bowel under both hemidiaphragms. The bony structures exhibit no acute abnormalities. Metallic shot are visible overlying the left aspect of the thorax. IMPRESSION: New small bilateral pleural effusions. Left basilar atelectasis or early pneumonia. No CHF. Thoracic aortic atherosclerosis. Electronically Signed   By: David  Martinique M.D.   On: 02/25/2018 09:01   Dg Chest 2 View  Result Date: 02/19/2018 CLINICAL DATA:  67 year old male with shortness of breath and COPD. EXAM: CHEST - 2 VIEW COMPARISON:  Chest radiograph dated 02/09/2016 FINDINGS: There is emphysematous changes of the lungs with hyperinflation. Diffuse interstitial coarsening. No focal consolidation, pleural effusion, or pneumothorax. The cardiac silhouette is within normal limits. Atherosclerotic calcification of the aortic arch. No acute osseous pathology. Multiple small radiopaque foci over the left chest wall and left upper extremity soft tissues. Clinical correlation is recommended. IMPRESSION: 1. No acute cardiopulmonary process. 2. Emphysema. Electronically Signed   By: Anner Crete M.D.   On: 02/19/2018 06:13   Ct Soft Tissue Neck W Contrast  Result Date: 02/19/2018 CLINICAL DATA:  67 y/o M; history of tongue cancer post radiation. KNot on the left-sided neck. Squamous cell carcinoma of the nose. EXAM: CT NECK WITH CONTRAST TECHNIQUE: Multidetector CT imaging of the neck was performed using the standard protocol following the bolus administration of intravenous contrast.  CONTRAST:  40mL OMNIPAQUE IOHEXOL 300 MG/ML  SOLN COMPARISON:  05/21/2013 PET-CT. FINDINGS: Pharynx and larynx: Diffuse smooth mucosal thickening of the oral and hypopharynx compatible with posttreatment changes. No exophytic enhancing nodular component. Salivary glands: Absent right submandibular gland. Additional salivary glands are unremarkable. Thyroid: Normal. Lymph nodes: Status post right radical neck dissection. No nodular enhancing disease within the right cervical chain to suggest recurrence. Necrotic lymphadenopathy in the left level 2/3 station measuring 2.6 x 2.5 x 3.8 cm (AP x ML x CC series 2, image 51 and series 7, image 60). No additional cervical lymphadenopathy identified. Vascular: Dense calcified plaque of the carotid bifurcations bilaterally with mild to moderate 50% left proximal ICA stenosis. Mild less than 50% proximal right ICA stenosis. The right vertebral artery neck is diffusely small in caliber with thickened wall and multiple areas of stenosis likely representing radiation changes. Limited intracranial: Negative. Visualized orbits: Negative. Mastoids and visualized paranasal sinuses: Moderate mucosal thickening of the maxillary sinuses bilaterally with chronic inflammatory changes of the sinus walls. Left mastoid tip effusion. Normal aeration of the right mastoid air cells. Skeleton: Moderate cervical spondylosis. No acute osseous abnormality identified. No high-grade bony canal stenosis. Upper chest: Mild centrilobular emphysema of the upper lobes. Clustered ground-glass opacities and mucous plugging in the left upper lobe, likely bronchopneumonia. Other: None. IMPRESSION: 1. Necrotic lymphadenopathy measuring up to 3.8 cm at the left level 2/3 cervical node station,  likely metastatic. No additional lymphadenopathy identified. 2. Posttreatment changes of the oral and hypopharyngeal mucosa and postsurgical changes related to right cervical node dissection. 3. Moderate mucosal  thickening of the maxillary sinuses and left mastoid tip effusion. 4. Calcific atherosclerosis of carotid bifurcations with mild to moderate 50% left proximal ICA stenosis. 5. Left upper lobe ground-glass opacities in bronchovascular distribution, likely bronchopneumonia. Electronically Signed   By: Kristine Garbe M.D.   On: 02/19/2018 04:36   US Renal  Result Date: 02/19/2018 CLINICAL DATA:  Chronic renal disease EXAM: RENAL / URINARY TRACT ULTRASOUND COMPLETE COMPARISON:  None. FINDINGS: Right Kidney: Length: 10.2 cm. Echogenicity and renal cortical thickness are within normal limits. No mass, perinephric fluid, or hydronephrosis visualized. No sonographically demonstrable calculus or ureterectasis. Left Kidney: Length: 10.0 cm. Echogenicity and renal cortical thickness are within normal limits. No mass, perinephric fluid, or hydronephrosis visualized. There are nonobstructing calculi in the left kidney, largest measuring 9 mm. No ureterectasis. Bladder: Appears normal for degree of bladder distention. IMPRESSION: Nonobstructing calculi in left kidney. Study otherwise unremarkable. Electronically Signed   By: Lowella Grip III M.D.   On: 02/19/2018 12:00   Ir Fluoro Rm 30-60 Min  Result Date: 02/25/2018 CLINICAL DATA:  History of recurrent head and neck cancer, now with dysphagia and concern for chronic aspiration. Request made for placement of an image guided gastrostomy tube for enteric nutrition supplementation purposes. Note, preceding noncontrast abdominal CT performed earlier today demonstrated a poor percutaneous window for gastrostomy tube placement however patient will be evaluated fluoroscopically following the insufflation of the stomach with air. EXAM: IR FLOURO RM 0-60 MIN ANESTHESIA/SEDATION: None MEDICATIONS: Glucagon 1 mg IV CONTRAST:  None PROCEDURE: Patient was positioned supine on the fluoroscopy table. Ultrasound was utilized to demarcate the liver edge. Appropriate  gastrostomy tube insertion site was marked on the patient's abdomen. 1 mg of glucagon was administered intravenously Under fluoroscopic guidance, a Kumpe catheter was advanced to the level of the stomach. The stomach was inflated as several fluoroscopic images were obtained in various obliquities. Images were reviewed and the decision was made not to proceed with gastrostomy tube placement given lack of safe percutaneous window. COMPLICATIONS: None immediate FINDINGS: Despite insufflation of the stomach with air no safe window was identified to allow for percutaneous gastrostomy tube placement with interposed transverse colon seen on all obliquities. IMPRESSION: No safe percutaneous window to allow for percutaneous gastrostomy tube placement despite insufflation of the stomach with air. PLAN: Recommend surgical consultation for gastrostomy tube placement. Electronically Signed   By: Sandi Mariscal M.D.   On: 02/25/2018 16:49   Dg Chest Port 1 View  Result Date: 03/01/2018 CLINICAL DATA:  Concern for aspiration. History of nasal region carcinoma EXAM: PORTABLE CHEST 1 VIEW COMPARISON:  February 25, 2018. FINDINGS: There is patchy airspace opacity in the lung bases, slightly more on the left than on the right, and in the right upper lobe. Lungs elsewhere are clear. Heart size and pulmonary vascular normal. No adenopathy. There is aortic atherosclerosis. No evident bone lesions. Several small metallic fragments are noted on the left. IMPRESSION: Patchy infiltrate in the lung bases, slightly more on the left than on the right as well as in the right upper lobe. Question areas of pneumonia versus aspiration. Both entities may present concurrently. Lungs elsewhere clear. Heart size normal. No adenopathy. There is aortic atherosclerosis. Aortic Atherosclerosis (ICD10-I70.0). Electronically Signed   By: Lowella Grip III M.D.   On: 03/01/2018 13:25   [place] Core Biopsy (  lymph Nodes)  Result Date: 02/21/2018 INDICATION:  67 year old with history of squamous cell carcinoma of the tongue. Status post radiation and surgical treatment. Patient has a left sided neck mass and presents for biopsy. EXAM: ULTRASOUND-GUIDED FINE-NEEDLE ASPIRATION AND CORE BIOPSY OF LEFT CERVICAL MASS/LYMPH NODE MEDICATIONS: None. ANESTHESIA/SEDATION: None FLUOROSCOPY TIME:  None COMPLICATIONS: None immediate. PROCEDURE: Informed written consent was obtained from the patient's daughter after a thorough discussion of the procedural risks, benefits and alternatives. All questions were addressed. A timeout was performed prior to the initiation of the procedure. The area of concern was identified with ultrasound. The left side of neck was prepped with chlorhexidine and a sterile field was created. Skin and soft tissues were anesthetized with 1% lidocaine. Using ultrasound guidance, 6 fine-needle aspirations were obtained with 25 gauge needles. Subsequently, 3 core biopsies were obtained with an 18 gauge core device. No significant bleeding or hematoma formation. Bandage placed over the puncture site. FINDINGS: Poorly defined heterogeneous soft tissue surrounding the left carotid artery at the bifurcation. Fine-needle aspiration and core biopsy needles identified within the lesion. IMPRESSION: Ultrasound-guided fine-needle aspiration and core biopsies of the left neck mass. Electronically Signed   By: Markus Daft M.D.   On: 02/21/2018 11:08   Dg Hip Unilat With Pelvis 2-3 Views Left  Result Date: 02/21/2018 CLINICAL DATA:  Fall. EXAM: DG HIP (WITH OR WITHOUT PELVIS) 2-3V LEFT COMPARISON:  No prior. FINDINGS: Diffuse left hip degenerative change. Diffuse osteopenia. No acute bony or joint abnormality. No evidence of fracture dislocation. Peripheral vascular calcification. IMPRESSION: 1. Diffuse degenerative change in osteopenia. No acute abnormality. 2.  Peripheral vascular disease. Electronically Signed   By: Marcello Moores  Register   On: 02/21/2018 14:47   Dg Hip  Unilat With Pelvis 2-3 Views Right  Result Date: 02/21/2018 CLINICAL DATA:  Fall from bed EXAM: DG HIP (WITH OR WITHOUT PELVIS) 2-3V RIGHT COMPARISON:  None. FINDINGS: Early degenerative changes with early spurring bilaterally. Joint spaces maintained. SI joints are symmetric and unremarkable. No acute bony abnormality. Specifically, no fracture, subluxation, or dislocation. Vascular calcifications. IMPRESSION: No acute bony abnormality. Electronically Signed   By: Rolm Baptise M.D.   On: 02/21/2018 14:47    Consults: Treatment Team:  Hillary Bow, MD Hermelinda Dellen, DO   Subjective:    Overnight Issues: patient did well overnight. Was weaned off of 100% down to nasal cannula. Secretions were cleared and this morning patient is in no respiratory distress complaining of a headache some neck pain on his left side  Objective:  Vital signs for last 24 hours: Temp:  [97.6 F (36.4 C)-98.6 F (37 C)] 98.6 F (37 C) (07/27 0200) Pulse Rate:  [84-125] 84 (07/27 0500) Resp:  [17-28] 17 (07/27 0500) BP: (97-133)/(60-71) 131/66 (07/27 0500) SpO2:  [91 %-99 %] 98 % (07/27 0500)  Hemodynamic parameters for last 24 hours:    Intake/Output from previous day: 07/26 0701 - 07/27 0700 In: 617.5 [I.V.:347.5; IV Piggyback:270] Out: 200 [Urine:200]  Intake/Output this shift: No intake/output data recorded.  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 History of COPD and dysphagia.Status post laparoscopic G-tube placement for nutritional support.initially transferred for respiratory distress, doing much better today, presently on nasal cannula and resting comfortably  Hyponatremia. Resolved  Hypophosphatemia. Replacement  Anemia. No evidence of active bleeding  Severe malnutrition. Status post lap appendectomy G-tube placement  At this point patient is doing well, stable for floor transfer  pain control along with G-tube feeding  Fynn Vanblarcom 03/02/2018  *Care during the described time interval was provided by me and/or other providers on the critical care team.  I have reviewed this patient's available data, including medical history, events of note, physical examination and test results as part of my evaluation.

## 2018-03-03 LAB — COMPREHENSIVE METABOLIC PANEL
ALT: 14 U/L (ref 0–44)
ANION GAP: 4 — AB (ref 5–15)
AST: 18 U/L (ref 15–41)
Albumin: 2 g/dL — ABNORMAL LOW (ref 3.5–5.0)
Alkaline Phosphatase: 44 U/L (ref 38–126)
BILIRUBIN TOTAL: 0.5 mg/dL (ref 0.3–1.2)
BUN: 12 mg/dL (ref 8–23)
CO2: 33 mmol/L — ABNORMAL HIGH (ref 22–32)
Calcium: 7.7 mg/dL — ABNORMAL LOW (ref 8.9–10.3)
Chloride: 99 mmol/L (ref 98–111)
Creatinine, Ser: 0.83 mg/dL (ref 0.61–1.24)
Glucose, Bld: 115 mg/dL — ABNORMAL HIGH (ref 70–99)
POTASSIUM: 3.3 mmol/L — AB (ref 3.5–5.1)
Sodium: 136 mmol/L (ref 135–145)
TOTAL PROTEIN: 4.8 g/dL — AB (ref 6.5–8.1)

## 2018-03-03 LAB — GLUCOSE, CAPILLARY
GLUCOSE-CAPILLARY: 108 mg/dL — AB (ref 70–99)
GLUCOSE-CAPILLARY: 110 mg/dL — AB (ref 70–99)
Glucose-Capillary: 123 mg/dL — ABNORMAL HIGH (ref 70–99)
Glucose-Capillary: 117 mg/dL — ABNORMAL HIGH (ref 70–99)
Glucose-Capillary: 109 mg/dL — ABNORMAL HIGH (ref 70–99)

## 2018-03-03 LAB — PHOSPHORUS: PHOSPHORUS: 1.6 mg/dL — AB (ref 2.5–4.6)

## 2018-03-03 LAB — MAGNESIUM: MAGNESIUM: 1.7 mg/dL (ref 1.7–2.4)

## 2018-03-03 MED ORDER — FREE WATER
120.0000 mL | Status: DC
  Administered 2018-03-03 – 2018-03-05 (×15): 120 mL

## 2018-03-03 MED ORDER — POTASSIUM PHOSPHATES 15 MMOLE/5ML IV SOLN
30.0000 mmol | Freq: Once | INTRAVENOUS | Status: AC
  Administered 2018-03-03: 30 mmol via INTRAVENOUS
  Filled 2018-03-03: qty 10

## 2018-03-03 MED ORDER — AMIODARONE PEDIATRIC ORAL SUSPENSION 5 MG/ML
200.0000 mg | Freq: Every day | ORAL | Status: DC
  Filled 2018-03-03: qty 40

## 2018-03-03 MED ORDER — POTASSIUM CHLORIDE 20 MEQ PO PACK
40.0000 meq | PACK | Freq: Once | ORAL | Status: AC
  Administered 2018-03-03: 40 meq via ORAL
  Filled 2018-03-03: qty 2

## 2018-03-03 MED ORDER — MAGNESIUM SULFATE 2 GM/50ML IV SOLN
2.0000 g | Freq: Once | INTRAVENOUS | Status: AC
  Administered 2018-03-03: 2 g via INTRAVENOUS
  Filled 2018-03-03: qty 50

## 2018-03-03 MED ORDER — AMIODARONE HCL 200 MG PO TABS
200.0000 mg | ORAL_TABLET | Freq: Every day | ORAL | Status: DC
  Administered 2018-03-03: 200 mg
  Filled 2018-03-03: qty 1

## 2018-03-03 MED ORDER — OSMOLITE 1.5 CAL PO LIQD
1000.0000 mL | ORAL | Status: DC
  Administered 2018-03-03: 1000 mL

## 2018-03-03 NOTE — Progress Notes (Signed)
Report called to Candace on 2A, she cannot take patients with cardiac gtts d/t being flex staffing.  We will hold pt until 1115 and take amiodarone gtt down before transport.  PO amiodarone given at 1015.  VSS on 2 liters [place].  Pt tolerating GTube intake w/no s/sx distress.  He has been instructed that he must remain upright for 1 hour post intake.  Chart belongings and meds will transport to 2A with him.

## 2018-03-03 NOTE — Progress Notes (Signed)
[facility] NAME: Thomas [name]    MR#:  [id number]  DATE OF BIRTH:  March 11, 1951  SUBJECTIVE:  CHIEF COMPLAINT:   Chief Complaint  Patient presents with  . Neck Pain   Tube feeds being advanced slowly.  Had hypoxia episode and transferred to ICU, stabilized now but have A. fib with rapid ventricular response and started on amiodarone drip today.  REVIEW OF SYSTEMS:    ROS  CONSTITUTIONAL: No documented fever. Has fatigue, weakness. No weight gain, has weight loss.  EYES: No blurry or double vision.  ENT: No tinnitus. No postnasal drip. No redness of the oropharynx.  Has difficulty swallowing food RESPIRATORY: No cough, no wheeze, no hemoptysis. No dyspnea.  CARDIOVASCULAR: No chest pain. No orthopnea. No palpitations. No syncope.  GASTROINTESTINAL: No nausea, no vomiting or diarrhea. No abdominal pain. No melena or hematochezia.  GENITOURINARY: No dysuria or hematuria.  ENDOCRINE: No polyuria or nocturia. No heat or cold intolerance.  HEMATOLOGY: No anemia. No bruising. No bleeding.  INTEGUMENTARY: No rashes. No lesions.  MUSCULOSKELETAL: No arthritis. No swelling. No gout.  NEUROLOGIC: No numbness, tingling, or ataxia. No seizure-type activity.  PSYCHIATRIC: No anxiety. No insomnia. No ADD.   DRUG ALLERGIES:  No Known Allergies  VITALS:  Blood pressure (!) 124/54, pulse 88, temperature 97.6 F (36.4 C), temperature source Oral, resp. rate 18, height 5\' 7"  (1.702 m), weight 49.9 kg (110 lb), SpO2 95 %.  PHYSICAL EXAMINATION:   Physical Exam  GENERAL:  67 y.o.-year-old cachectic male patient lying in the bed with no acute distress.  EYES: Pupils equal, round, reactive to light and accommodation. No scleral icterus. Extraocular muscles intact.  HEENT: Head atraumatic, normocephalic. Oropharynx and nasopharynx clear.  Cervical lymphadenopathy NECK:  Supple, no jugular venous distention. No thyroid enlargement, no tenderness.   LUNGS: Normal breath sounds bilaterally, no wheezing, rales, rhonchi. No use of accessory muscles of respiration.  CARDIOVASCULAR: S1, S2 normal. No murmurs, rubs, or gallops.  ABDOMEN: Soft, nontender, nondistended. Bowel sounds present. No organomegaly or mass. PEG in place. EXTREMITIES: No cyanosis, clubbing or edema b/l.    NEUROLOGIC: Cranial nerves II through XII are intact. No focal Motor or sensory deficits b/l.  Generalized weakness. PSYCHIATRIC: The patient is alert and oriented x 3.  SKIN: No obvious rash, lesion, or ulcer.   LABORATORY PANEL:   CBC Recent Labs  Lab 03/02/18 0430  WBC 17.0*  HGB 8.8*  HCT 26.9*  PLT 236   ------------------------------------------------------------------------------------------------------------------ Chemistries  Recent Labs  Lab 03/03/18 0525  NA 136  K 3.3*  CL 99  CO2 33*  GLUCOSE 115*  BUN 12  CREATININE 0.83  CALCIUM 7.7*  MG 1.7  AST 18  ALT 14  ALKPHOS 44  BILITOT 0.5   ------------------------------------------------------------------------------------------------------------------  Cardiac Enzymes No results for input(s): TROPONINI in the last 168 hours. ------------------------------------------------------------------------------------------------------------------  RADIOLOGY:  No results found.   ASSESSMENT AND PLAN:  67 year old male patient with history of head and neck cancer bipolar disorder, COPD, chronic kidney disease stage III currently under hospitalist service for severe malnutrition, cervical lymphadenopathy and low sodium level  - Aspiration pneumonia   On unasyn. Changed to Augmentin through PEG tube.  -Dysphagia PEG could not be placed by IR or GI.  Surgical team help appreciated with PEG tube placement.  Started on tube feeds .  Monitor electrolytes.   - A fib with RVR   On amio drip.  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  - Head and neck cancer Oncology follow-up as outpatient  palliative care to help with goals of care discussion.  -Hyponatremia - improved  SIADH  Off Salt tablets  -Accidental fall Pelvic x-ray no evidence of fracture Physical therapy evaluation for endurance training Plan to send to [facility] once stable.  -Severe malnutrition Nutritional supplements Start feeding via tube .  -COPD stable  -Tobacco cessation Counseled to quit on admission  -Alcohol abuse Counseled on admission On thiamine supplements   All the records are reviewed and case discussed with Care Management/Social Worker. Management plans discussed with the patient, family and they are in agreement.  CODE STATUS: Full code.  DVT Prophylaxis: SCDs  TOTAL TIME TAKING CARE OF THIS PATIENT: 35 minutes.  Discussed with patient's daughter on the phone.  POSSIBLE D/C IN 2 to 3 DAYS, DEPENDING ON CLINICAL CONDITION.  Vaughan Basta M.D on 03/03/2018 at 5:26 PM  Between 7am to 6pm - Pager - [phone] After 6pm go to [email or web address] - password [email or web address] [facility]  Office  [phone]  CC: Primary care physician; [facility]  Note: This dictation was prepared with Dragon dictation along with smaller phrase technology. Any transcriptional errors that result from this process are unintentional.

## 2018-03-03 NOTE — Progress Notes (Signed)
Follow up - Critical Care Medicine Note  Patient Details:    Thomas [name] is an 67 y.o. 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 Course has been complicated by hyponatremia, has been seen by nephrology, hypothyroidism, started on Synthroid, left neck ultrasound guided needle aspiration shows recurrent squamous cell carcinoma being followed by oncology, . Oral nutrition, status post laparoscopic G-tube placement, was moved to the ICU yesterday secondary to difficulty managing secretions and respiratory distress.  Lines, Airways, Drains: Gastrostomy/Enterostomy Gastrostomy 16 Fr. Other (comment) (Active)  Surrounding Skin Dry;Intact 03/02/2018  2:00 AM  Tube Status Clamped 03/02/2018  2:00 AM  Drainage Appearance None 03/02/2018  2:00 AM  Dressing Status Intact;Clean;Dry 03/02/2018  2:00 AM  Dressing Intervention Dressing changed 03/02/2018  2:00 AM  Dressing Type Split gauze 03/02/2018  2:00 AM  Dressing Change Due 03/04/18 03/02/2018  2:00 AM     External Urinary Catheter (Active)  Collection Container Standard drainage bag 03/02/2018  2:00 AM  Securement Method Securing device (Describe) 03/02/2018  2:00 AM  Output (mL) 75 mL 03/02/2018 12:00 AM    Anti-infectives:  Anti-infectives (From admission, onward)   Start     Dose/Rate Route Frequency Ordered Stop   03/01/18 1700  ampicillin-sulbactam  (UNASYN) 1.5 g in sodium chloride 0.9 % 100 mL IVPB     1.5 g 200 mL/hr over 30 Minutes Intravenous Every 6 hours 03/01/18 1636     03/01/18 1130  amoxicillin-clavulanate (AUGMENTIN) 400-57 MG/5ML suspension 800 mg  Status:  Discontinued     800 mg Per Tube Every 12 hours 03/01/18 1128 03/01/18 1636   03/01/18 0000  amoxicillin-clavulanate (AUGMENTIN) 400-57 MG/5ML suspension     800 mg Per Tube Every 12 hours 03/01/18 1322     02/25/18 1600  ampicillin-sulbactam (UNASYN) 1.5 g in sodium chloride 0.9 % 100 mL IVPB  Status:  Discontinued     1.5 g 200 mL/hr over 30 Minutes Intravenous Every 6 hours 02/25/18 1455 03/01/18 1128   02/19/18 1400  metroNIDAZOLE (FLAGYL) tablet 500 mg  Status:  Discontinued     500 mg Oral Every 8 hours 02/19/18 1038 02/20/18 1140   02/19/18 1045  amoxicillin-clavulanate (AUGMENTIN) 875-125 MG per tablet 1 tablet  Status:  Discontinued     1 tablet Oral Every 12 hours 02/19/18 1038 02/20/18 1140      Microbiology: Results for orders placed or performed during the hospital encounter of 02/19/18  Culture, blood (routine x 2) Call MD if unable to obtain prior to antibiotics being given     Status: None   Collection Time: 02/19/18  1:08 PM  Result Value Ref Range Status   Specimen Description BLOOD RIGHT ANTECUBITAL  Final   Special Requests   Final    BOTTLES DRAWN AEROBIC AND ANAEROBIC Blood Culture adequate volume   Culture   Final    NO GROWTH 5 DAYS Performed at [facility], [address], [place], [place] [address]    Report Status 02/24/2018  FINAL  Final  Culture, blood (routine x 2) Call MD if unable to obtain prior to antibiotics being given     Status: None   Collection Time: 02/19/18  1:19 PM  Result Value Ref Range Status   Specimen Description BLOOD LEFT ANTECUBITAL  Final   Special Requests   Final    BOTTLES DRAWN AEROBIC AND ANAEROBIC Blood Culture adequate volume   Culture   Final    NO GROWTH 5 DAYS Performed at [facility], [address] [place], [place] [address]    Report Status 02/24/2018 FINAL  Final  MRSA PCR Screening     Status: Abnormal   Collection Time: 03/01/18  3:41 PM  Result Value Ref Range Status   MRSA by PCR POSITIVE (A) NEGATIVE Final    Comment:        The GeneXpert MRSA Assay (FDA approved for NASAL specimens only), is one component of a comprehensive MRSA colonization surveillance program. It is not intended to diagnose MRSA infection nor to guide or monitor treatment for MRSA infections. RESULT CALLED TO, READ BACK BY AND VERIFIED WITH: CHERYL GREEN 03/01/18 AT 1730 BY HS Performed at [facility], [address] [place], [place] [address]      Studies: Ct Abdomen Wo Contrast  Result Date: 02/25/2018 CLINICAL DATA:  Evaluate anatomy prior to potential percutaneous gastrostomy tube placement. EXAM: CT ABDOMEN WITHOUT CONTRAST TECHNIQUE: Multidetector CT imaging of the abdomen was performed following the standard protocol without IV contrast. COMPARISON:  CT abdomen pelvis-10/25/2010; chest radiograph earlier same day FINDINGS: The lack of intravenous contrast limits the ability to evaluate solid abdominal organs. Lower chest: Limited visualization of the lower thorax demonstrates trace bilateral effusions with consolidative airspace opacities within the imaged left lower lobe. Normal heart size. Coronary artery calcifications. There is diffuse decreased attenuation intra cardiac blood pool suggestive of anemia. Trace amount of pericardial fluid, presumably physiologic. Hepatobiliary: Normal hepatic contour. Layering material within the gallbladder may represent gallstones or biliary sludge (image 28, series 2). No definitive gallbladder wall thickening or pericholecystic fluid on this noncontrast examination. No definite ascites. Pancreas: Normal non-contrast appearance of pancreas. Spleen: Normal noncontrast appearance of the spleen Adrenals/Urinary Tract:  There is an approximately 0.8 x 0.5 cm nonobstructing stone within the interpolar aspect the left kidney (image 25, series 2). No definite right-sided renal stones. No renal stones are seen along the superior aspect of either ureter. Normal noncontrast appearance of the bilateral adrenal glands. The urinary bladder is not imaged. Stomach/Bowel: The transverse colon is interposed between the anterior wall the stomach and ventral wall of the upper abdomen. Ingested barium from previous swallowing examination is seen within the transverse colon. Moderate gaseous distention of the colon. No pneumoperitoneum, pneumatosis or portal venous gas. Vascular/Lymphatic: Vascular calcifications within a normal size abdominal aorta. No bulky retroperitoneal mesenteric adenopathy on this noncontrast examination. Other: Minimal amount of subcutaneous edema the midline of the low back. Musculoskeletal: No acute or aggressive osseous abnormalities. Unchanged limbus body involving the anterior superior aspect of the L4 vertebral body. Mild-to-moderate multilevel lumbar spine DDD, worse at T11-T12, T12-L1 and L4-L5 with disc space height loss endplate irregularity and sclerosis. Bilateral facet degenerative change the lower lumbar spine. IMPRESSION: 1. Transverse colon is interposed between the anterior wall of the stomach and ventral wall of the upper abdomen, rendering percutaneous gastrostomy tube challenging. 2. Trace pleural effusions with consolidative opacities within the imaged left lung base worrisome for infection and/or aspiration. 3. Moderate gaseous  distention of the colon without evidence of enteric obstruction. 4. Potential cholelithiasis without findings of acute cholecystitis on this noncontrast examination. 5. Approximately 0.8 cm nonobstructing left-sided renal stone. 6.  Aortic Atherosclerosis (ICD10-I70.0). Electronically Signed   By: Sandi Mariscal M.D.   On: 02/25/2018 09:16   Dg Chest 2 View  Result Date:  02/25/2018 CLINICAL DATA:  Fever, weakness, history of COPD, nasopharygeal malignancy. EXAM: CHEST - 2 VIEW COMPARISON:  PA and lateral chest x-ray of February 19, 2018 FINDINGS: The lungs remain hyperinflated. There is hazy increased density at the left lung base. There small bilateral pleural effusions layering posteriorly which are new. The heart and pulmonary vascularity are normal. There is calcification in the wall of the aortic arch. There is gaseous distention of bowel under both hemidiaphragms. The bony structures exhibit no acute abnormalities. Metallic shot are visible overlying the left aspect of the thorax. IMPRESSION: New small bilateral pleural effusions. Left basilar atelectasis or early pneumonia. No CHF. Thoracic aortic atherosclerosis. Electronically Signed   By: David  Martinique M.D.   On: 02/25/2018 09:01   Dg Chest 2 View  Result Date: 02/19/2018 CLINICAL DATA:  67 year old male with shortness of breath and COPD. EXAM: CHEST - 2 VIEW COMPARISON:  Chest radiograph dated 02/09/2016 FINDINGS: There is emphysematous changes of the lungs with hyperinflation. Diffuse interstitial coarsening. No focal consolidation, pleural effusion, or pneumothorax. The cardiac silhouette is within normal limits. Atherosclerotic calcification of the aortic arch. No acute osseous pathology. Multiple small radiopaque foci over the left chest wall and left upper extremity soft tissues. Clinical correlation is recommended. IMPRESSION: 1. No acute cardiopulmonary process. 2. Emphysema. Electronically Signed   By: Anner Crete M.D.   On: 02/19/2018 06:13   Ct Soft Tissue Neck W Contrast  Result Date: 02/19/2018 CLINICAL DATA:  67 y/o M; history of tongue cancer post radiation. KNot on the left-sided neck. Squamous cell carcinoma of the nose. EXAM: CT NECK WITH CONTRAST TECHNIQUE: Multidetector CT imaging of the neck was performed using the standard protocol following the bolus administration of intravenous contrast.  CONTRAST:  57mL OMNIPAQUE IOHEXOL 300 MG/ML  SOLN COMPARISON:  05/21/2013 PET-CT. FINDINGS: Pharynx and larynx: Diffuse smooth mucosal thickening of the oral and hypopharynx compatible with posttreatment changes. No exophytic enhancing nodular component. Salivary glands: Absent right submandibular gland. Additional salivary glands are unremarkable. Thyroid: Normal. Lymph nodes: Status post right radical neck dissection. No nodular enhancing disease within the right cervical chain to suggest recurrence. Necrotic lymphadenopathy in the left level 2/3 station measuring 2.6 x 2.5 x 3.8 cm (AP x ML x CC series 2, image 51 and series 7, image 60). No additional cervical lymphadenopathy identified. Vascular: Dense calcified plaque of the carotid bifurcations bilaterally with mild to moderate 50% left proximal ICA stenosis. Mild less than 50% proximal right ICA stenosis. The right vertebral artery neck is diffusely small in caliber with thickened wall and multiple areas of stenosis likely representing radiation changes. Limited intracranial: Negative. Visualized orbits: Negative. Mastoids and visualized paranasal sinuses: Moderate mucosal thickening of the maxillary sinuses bilaterally with chronic inflammatory changes of the sinus walls. Left mastoid tip effusion. Normal aeration of the right mastoid air cells. Skeleton: Moderate cervical spondylosis. No acute osseous abnormality identified. No high-grade bony canal stenosis. Upper chest: Mild centrilobular emphysema of the upper lobes. Clustered ground-glass opacities and mucous plugging in the left upper lobe, likely bronchopneumonia. Other: None. IMPRESSION: 1. Necrotic lymphadenopathy measuring up to 3.8 cm at the left level 2/3 cervical node station,  likely metastatic. No additional lymphadenopathy identified. 2. Posttreatment changes of the oral and hypopharyngeal mucosa and postsurgical changes related to right cervical node dissection. 3. Moderate mucosal  thickening of the maxillary sinuses and left mastoid tip effusion. 4. Calcific atherosclerosis of carotid bifurcations with mild to moderate 50% left proximal ICA stenosis. 5. Left upper lobe ground-glass opacities in bronchovascular distribution, likely bronchopneumonia. Electronically Signed   By: Kristine Garbe M.D.   On: 02/19/2018 04:36   US Renal  Result Date: 02/19/2018 CLINICAL DATA:  Chronic renal disease EXAM: RENAL / URINARY TRACT ULTRASOUND COMPLETE COMPARISON:  None. FINDINGS: Right Kidney: Length: 10.2 cm. Echogenicity and renal cortical thickness are within normal limits. No mass, perinephric fluid, or hydronephrosis visualized. No sonographically demonstrable calculus or ureterectasis. Left Kidney: Length: 10.0 cm. Echogenicity and renal cortical thickness are within normal limits. No mass, perinephric fluid, or hydronephrosis visualized. There are nonobstructing calculi in the left kidney, largest measuring 9 mm. No ureterectasis. Bladder: Appears normal for degree of bladder distention. IMPRESSION: Nonobstructing calculi in left kidney. Study otherwise unremarkable. Electronically Signed   By: Lowella Grip III M.D.   On: 02/19/2018 12:00   Ir Fluoro Rm 30-60 Min  Result Date: 02/25/2018 CLINICAL DATA:  History of recurrent head and neck cancer, now with dysphagia and concern for chronic aspiration. Request made for placement of an image guided gastrostomy tube for enteric nutrition supplementation purposes. Note, preceding noncontrast abdominal CT performed earlier today demonstrated a poor percutaneous window for gastrostomy tube placement however patient will be evaluated fluoroscopically following the insufflation of the stomach with air. EXAM: IR FLOURO RM 0-60 MIN ANESTHESIA/SEDATION: None MEDICATIONS: Glucagon 1 mg IV CONTRAST:  None PROCEDURE: Patient was positioned supine on the fluoroscopy table. Ultrasound was utilized to demarcate the liver edge. Appropriate  gastrostomy tube insertion site was marked on the patient's abdomen. 1 mg of glucagon was administered intravenously Under fluoroscopic guidance, a Kumpe catheter was advanced to the level of the stomach. The stomach was inflated as several fluoroscopic images were obtained in various obliquities. Images were reviewed and the decision was made not to proceed with gastrostomy tube placement given lack of safe percutaneous window. COMPLICATIONS: None immediate FINDINGS: Despite insufflation of the stomach with air no safe window was identified to allow for percutaneous gastrostomy tube placement with interposed transverse colon seen on all obliquities. IMPRESSION: No safe percutaneous window to allow for percutaneous gastrostomy tube placement despite insufflation of the stomach with air. PLAN: Recommend surgical consultation for gastrostomy tube placement. Electronically Signed   By: Sandi Mariscal M.D.   On: 02/25/2018 16:49   Dg Chest Port 1 View  Result Date: 03/01/2018 CLINICAL DATA:  Concern for aspiration. History of nasal region carcinoma EXAM: PORTABLE CHEST 1 VIEW COMPARISON:  February 25, 2018. FINDINGS: There is patchy airspace opacity in the lung bases, slightly more on the left than on the right, and in the right upper lobe. Lungs elsewhere are clear. Heart size and pulmonary vascular normal. No adenopathy. There is aortic atherosclerosis. No evident bone lesions. Several small metallic fragments are noted on the left. IMPRESSION: Patchy infiltrate in the lung bases, slightly more on the left than on the right as well as in the right upper lobe. Question areas of pneumonia versus aspiration. Both entities may present concurrently. Lungs elsewhere clear. Heart size normal. No adenopathy. There is aortic atherosclerosis. Aortic Atherosclerosis (ICD10-I70.0). Electronically Signed   By: Lowella Grip III M.D.   On: 03/01/2018 13:25   [place] Core Biopsy (  lymph Nodes)  Result Date: 02/21/2018 INDICATION:  67 year old with history of squamous cell carcinoma of the tongue. Status post radiation and surgical treatment. Patient has a left sided neck mass and presents for biopsy. EXAM: ULTRASOUND-GUIDED FINE-NEEDLE ASPIRATION AND CORE BIOPSY OF LEFT CERVICAL MASS/LYMPH NODE MEDICATIONS: None. ANESTHESIA/SEDATION: None FLUOROSCOPY TIME:  None COMPLICATIONS: None immediate. PROCEDURE: Informed written consent was obtained from the patient's daughter after a thorough discussion of the procedural risks, benefits and alternatives. All questions were addressed. A timeout was performed prior to the initiation of the procedure. The area of concern was identified with ultrasound. The left side of neck was prepped with chlorhexidine and a sterile field was created. Skin and soft tissues were anesthetized with 1% lidocaine. Using ultrasound guidance, 6 fine-needle aspirations were obtained with 25 gauge needles. Subsequently, 3 core biopsies were obtained with an 18 gauge core device. No significant bleeding or hematoma formation. Bandage placed over the puncture site. FINDINGS: Poorly defined heterogeneous soft tissue surrounding the left carotid artery at the bifurcation. Fine-needle aspiration and core biopsy needles identified within the lesion. IMPRESSION: Ultrasound-guided fine-needle aspiration and core biopsies of the left neck mass. Electronically Signed   By: Markus Daft M.D.   On: 02/21/2018 11:08   Dg Hip Unilat With Pelvis 2-3 Views Left  Result Date: 02/21/2018 CLINICAL DATA:  Fall. EXAM: DG HIP (WITH OR WITHOUT PELVIS) 2-3V LEFT COMPARISON:  No prior. FINDINGS: Diffuse left hip degenerative change. Diffuse osteopenia. No acute bony or joint abnormality. No evidence of fracture dislocation. Peripheral vascular calcification. IMPRESSION: 1. Diffuse degenerative change in osteopenia. No acute abnormality. 2.  Peripheral vascular disease. Electronically Signed   By: Marcello Moores  Register   On: 02/21/2018 14:47   Dg Hip  Unilat With Pelvis 2-3 Views Right  Result Date: 02/21/2018 CLINICAL DATA:  Fall from bed EXAM: DG HIP (WITH OR WITHOUT PELVIS) 2-3V RIGHT COMPARISON:  None. FINDINGS: Early degenerative changes with early spurring bilaterally. Joint spaces maintained. SI joints are symmetric and unremarkable. No acute bony abnormality. Specifically, no fracture, subluxation, or dislocation. Vascular calcifications. IMPRESSION: No acute bony abnormality. Electronically Signed   By: Rolm Baptise M.D.   On: 02/21/2018 14:47    Consults: Treatment Team:  Hillary Bow, MD Hermelinda Dellen, DO   Subjective:    Overnight Issues: patient had episode of rapid atrial fibrillation, was started on amiodarone infusion with subsequent conversion to sinus rhythm. Respiratory status has been stable overnight  Objective:  Vital signs for last 24 hours: Temp:  [97.5 F (36.4 C)-98.3 F (36.8 C)] 97.6 F (36.4 C) (07/28 0830) Pulse Rate:  [83-147] 85 (07/28 0830) Resp:  [18-28] 22 (07/28 0830) BP: (95-150)/(49-78) 150/75 (07/28 0800) SpO2:  [89 %-100 %] 100 % (07/28 0830)  Hemodynamic parameters for last 24 hours:    Intake/Output from previous day: 07/27 0701 - 07/28 0700 In: 1766.1 [I.V.:970.1; NG/GT:596; IV Piggyback:200] Out: 470 [Urine:470]  Intake/Output this shift: No intake/output data recorded.  Vent settings for last 24 hours:    Physical Exam:   Cachectic patient, doing well this morning on nasal cannula with no accessory muscle utilization Vital signs:       Please see the above listed vital signs HEENT:           Left neck swelling Pulmonary:      much improved, clear to auscultation Abdominal:      G-tube in place, soft exam Extremities:     No clubbing cyanosis or edema noted Neurologic:  Limited exam, patient is awake and responsive, moves extremities Cutaneous:      No rashes or lesions noted     Assessment/Plan:   Recurrent head and neck cancer, prior history of stage IV  squamous cell carcinoma of the tongue base and stage II squamous cell carcinoma nasal, with progressive increasing left-sided neck mass, status post ultrasound guided biopsy which was positive for recurrent squamous cell carcinoma. History of COPD and dysphagia.Status post laparoscopic G-tube placement for nutritional support.initially transferred for respiratory distress, doing much better today, presently on nasal cannula and resting comfortably  Atrial fibrillation. Patient converted with amiodarone infusion, will change to oral/G-tube amiodarone suspension  Hypokalemia. Being replaced  Hypophosphatemia. Replacement  Anemia. No evidence of active bleeding  Severe malnutrition. Status post lap appendectomy G-tube placement  At this point patient is doing well, stable for floor transfer  pain control along with G-tube feeding  Yenesis Even 03/03/2018  *Care during the described time interval was provided by me and/or other providers on the critical care team.  I have reviewed this patient's available data, including medical history, events of note, physical examination and test results as part of my evaluation. Patient ID: Thomas [name], male   DOB: [birth year], 67 y.o.   MRN: [id number]

## 2018-03-03 NOTE — Progress Notes (Signed)
Pharmacy Electrolyte Monitoring Consult:  Pharmacy consulted to assist in monitoring and replacing electrolytes in this 67 y.o. male with recurrent head and neck cancer with AF converted with amiodarone.   Labs:  Sodium (mmol/L)  Date Value  03/03/2018 136  02/22/2013 137   Potassium (mmol/L)  Date Value  03/03/2018 3.3 (L)  02/22/2013 4.6   Magnesium (mg/dL)  Date Value  03/03/2018 1.7  02/22/2013 2.1   Phosphorus (mg/dL)  Date Value  03/03/2018 1.6 (L)   Calcium (mg/dL)  Date Value  03/03/2018 7.7 (L)   Calcium, Total (mg/dL)  Date Value  02/22/2013 8.9   Albumin (g/dL)  Date Value  03/03/2018 2.0 (L)  02/19/2013 3.6    Assessment/Plan: K replaced with KCl 40 meq po once. In addition, will order Kphos 15 mmol iv once and magnesium sulfate 2 g iv once. Will recheck labs in AM.   Ulice Dash D 03/03/2018 11:22 AM

## 2018-03-03 NOTE — Progress Notes (Signed)
Nutrition Follow-up  DOCUMENTATION CODES:   Severe malnutrition in context of chronic illness, Underweight  INTERVENTION:  Recommend changing patient to continuous tube feeding as he experienced vomiting and aspiration on only 1/2 can of bolus regimen.  Initiate Osmolite 1.5 Cal at 20 mL/hr via G-tube.  In AM if electrolytes are within acceptable range, can begin advancing tube feeds by 15 mL/hr every 8 hours to goal regimen of Osmolite 1.5 Cal at 50 mL/hr via G-tube. Provides 1800 kcal, 75 grams of protein, 912 mL H2O daily.  Provide free water flush of 120 mL Q3hrs on pump. Provides a total of 1872 mL H2O daily including water in tube feeding.  Continue MVI daily, folic acid 1 mg daily, thiamine 100 mg daily.  Recommend pharmacy consult for electrolyte monitoring and replacement as patient is likely refeeding from tube feeding.  NUTRITION DIAGNOSIS:   Severe Malnutrition related to chronic illness(COPD, CKD stage III, EtOH abuse, hx H&N cancer, cervical lymphadenopathy concerning for recurrence/metastasis) as evidenced by severe fat depletion, severe muscle depletion.  Ongoing - addressing with TF regimen.  GOAL:   Patient will meet greater than or equal to 90% of their needs  Met with TF regimen.  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   -Patient underwent MBSS on 7/19. Findings including deficits consistent with late effects of XRT on swallowing and are not likely to improve with time. Patient is at severe risk for aspiration with PO intake so recommendation was made for patient to remain NPO. -On 7/22 placement of G-tube by IR was attempted but could not be completed as there was no safe percutaneous window to allow for percutaneous placement. -Per GI note on 7/22 it will also not be possible for GI to place tube endoscopically. -Per Oncology note it is recommended patient have outpatient PET. He will likely need immunotherapy +/- chemotherapy. -Patient s/p laparoscopic placement of G-tube on 7/24. -Patient is being worked-up for placement at [facility] after discharge. -On afternoon of 7/26 patient experienced vomiting and acute deterioration of respiratory status related to aspiration approximately 2 hours after receiving 1/2 can of Osmolite 1.5 Cal.  Patient being transferred back to floor today. Met with patient this AM while he was in ICU. He reports he is feeling well today. Currently on 2L/min O2 via nasal cannula. Patient had previously been on room air prior to aspiration event. No family members present at bedside.  Access: 16 Fr. [place] G-tube with 20 cc balloon placed laparoscopically by surgeon on 7/24  TF: initially on hold following aspiration; pt resumed 1/2 can TID and has been tolerating well, but RD concerned about risk for recurrent aspiration on bolus regimen  Medications reviewed and include: sennosides 5 mL daily per tube, docusate 100 mg daily per tube, fentanyl patch, folic acid 1 mg daily, levothyroxine per tube, MVI daily, thiamine 100 mg daily, Unasyn. Phosphorus replacement was  ordered per tube on 7/26 but pt did not receive due to emesis.  Labs reviewed: CBG 108-136 past 24 hrs, Potassium 3.3, CO2 33, Anion gap 4, Phosphorus 1.6 (was 2.1 on 7/26 and then not checked on  7/27).  Discussed with RN and MD.  Diet Order:   Diet Order           Diet NPO time specified  Diet effective now          EDUCATION NEEDS:   Not appropriate for education at this time  Skin:  Skin Assessment: Reviewed RN Assessment(ecchymosis to bilateral arms)  Last BM:  PTA (02/18/2018 per chart)  Height:   Ht Readings from Last 1 Encounters:  02/27/18 _0  (1.702 m)    Weight:   Wt Readings from Last 1 Encounters:  02/27/18 110 lb (49.9 kg)    Ideal Body Weight:  67.3 kg  BMI:  Body mass index is 17.23 kg/m.  Estimated Nutritional Needs:   Kcal:  1630-1880 (MSJ x 1.3-1.5)  Protein:  75-90 grams (1.5-1.8 grams/kg)  Fluid:  1.6-1.9 L/day (1 mL/kcal)  Willey Blade, MS, RD, LDN Office: [phone] Pager: [phone] After Hours/Weekend Pager: [phone]

## 2018-03-03 NOTE — Progress Notes (Signed)
[name] NAME: Thomas [name]    MR#:  [id number]  DATE OF BIRTH:  [birth year]  SUBJECTIVE:  CHIEF COMPLAINT:   Chief Complaint  Patient presents with  . Neck Pain   Tube feeds being advanced slowly.  Had hypoxia episode and transferred to ICU, stabilized now but have A. fib with rapid ventricular response and started on amiodarone drip , stable now.  REVIEW OF SYSTEMS:    ROS  CONSTITUTIONAL: No documented fever. Has fatigue, weakness. No weight gain, has weight loss.  EYES: No blurry or double vision.  ENT: No tinnitus. No postnasal drip. No redness of the oropharynx.  Has difficulty swallowing food RESPIRATORY: No cough, no wheeze, no hemoptysis. No dyspnea.  CARDIOVASCULAR: No chest pain. No orthopnea. No palpitations. No syncope.  GASTROINTESTINAL: No nausea, no vomiting or diarrhea. No abdominal pain. No melena or hematochezia.  GENITOURINARY: No dysuria or hematuria.  ENDOCRINE: No polyuria or nocturia. No heat or cold intolerance.  HEMATOLOGY: No anemia. No bruising. No bleeding.  INTEGUMENTARY: No rashes. No lesions.  MUSCULOSKELETAL: No arthritis. No swelling. No gout.  NEUROLOGIC: No numbness, tingling, or ataxia. No seizure-type activity.  PSYCHIATRIC: No anxiety. No insomnia. No ADD.   DRUG ALLERGIES:  No Known Allergies  VITALS:  Blood pressure (!) 124/54, pulse 88, temperature 97.6 F (36.4 C), temperature source Oral, resp. rate 18, height 5\' 7"  (1.702 m), weight 49.9 kg (110 lb), SpO2 95 %.  PHYSICAL EXAMINATION:   Physical Exam  GENERAL:  67 y.o.-year-old cachectic male patient lying in the bed with no acute distress.  EYES: Pupils equal, round, reactive to light and accommodation. No scleral icterus. Extraocular muscles intact.  HEENT: Head atraumatic, normocephalic. Oropharynx and nasopharynx clear.  Cervical lymphadenopathy NECK:  Supple, no jugular venous distention. No thyroid enlargement, no  tenderness.  LUNGS: Normal breath sounds bilaterally, no wheezing, rales, rhonchi. No use of accessory muscles of respiration.  CARDIOVASCULAR: S1, S2 normal. No murmurs, rubs, or gallops.  ABDOMEN: Soft, nontender, nondistended. Bowel sounds present. No organomegaly or mass. PEG in place. EXTREMITIES: No cyanosis, clubbing or edema b/l.    NEUROLOGIC: Cranial nerves II through XII are intact. No focal Motor or sensory deficits b/l.  Generalized weakness. PSYCHIATRIC: The patient is alert and oriented x 3.  SKIN: No obvious rash, lesion, or ulcer.   LABORATORY PANEL:   CBC Recent Labs  Lab 03/02/18 0430  WBC 17.0*  HGB 8.8*  HCT 26.9*  PLT 236   ------------------------------------------------------------------------------------------------------------------ Chemistries  Recent Labs  Lab 03/03/18 0525  NA 136  K 3.3*  CL 99  CO2 33*  GLUCOSE 115*  BUN 12  CREATININE 0.83  CALCIUM 7.7*  MG 1.7  AST 18  ALT 14  ALKPHOS 44  BILITOT 0.5   ------------------------------------------------------------------------------------------------------------------  Cardiac Enzymes No results for input(s): TROPONINI in the last 168 hours. ------------------------------------------------------------------------------------------------------------------  RADIOLOGY:  No results found.   ASSESSMENT AND PLAN:  67 year old male patient with history of head and neck cancer bipolar disorder, COPD, chronic kidney disease stage III currently under hospitalist service for severe malnutrition, cervical lymphadenopathy and low sodium level  - Aspiration pneumonia   On unasyn. Changed to Augmentin through PEG tube.  -Dysphagia PEG could not be placed by IR or GI.  Surgical team help appreciated with PEG tube placement.  Started on tube feeds .  Monitor electrolytes.   - A fib with RVR   On amio drip.   Switched to  amio per tube.  -Cervical lymphadenopathy/mass S/p CT guided neck  biopsy ,  Path results revealed metastatic squamous cell carcinoma Follow-up with cancer center after starting adequate diet for further management.  - Head and neck cancer Oncology follow-up as outpatient  palliative care to help with goals of care discussion.  -Hyponatremia - improved  SIADH  Off Salt tablets  -Accidental fall Pelvic x-ray no evidence of fracture Physical therapy evaluation for endurance training Plan to send to [facility] once stable.  -Severe malnutrition Nutritional supplements Start feeding via tube .  -COPD stable  -Tobacco cessation Counseled to quit on admission  -Alcohol abuse Counseled on admission On thiamine supplements   All the records are reviewed and case discussed with Care Management/Social Worker. Management plans discussed with the patient, family and they are in agreement.  CODE STATUS: Full code.  DVT Prophylaxis: SCDs  TOTAL TIME TAKING CARE OF THIS PATIENT: 35 minutes.  Discussed with patient's daughter on the phone.  POSSIBLE D/C IN 2 to 3 DAYS, DEPENDING ON CLINICAL CONDITION.  Vaughan Basta M.D on 03/03/2018 at 5:29 PM  Between 7am to 6pm - Pager - [phone] After 6pm go to [email or web address] - password [email or web address] [facility]  Office  [phone]  CC: Primary care physician; [facility]  Note: This dictation was prepared with Dragon dictation along with smaller phrase technology. Any transcriptional errors that result from this process are unintentional.

## 2018-03-04 DIAGNOSIS — R1310 Dysphagia, unspecified: Secondary | ICD-10-CM

## 2018-03-04 LAB — GLUCOSE, CAPILLARY
GLUCOSE-CAPILLARY: 102 mg/dL — AB (ref 70–99)
GLUCOSE-CAPILLARY: 103 mg/dL — AB (ref 70–99)
GLUCOSE-CAPILLARY: 106 mg/dL — AB (ref 70–99)
GLUCOSE-CAPILLARY: 115 mg/dL — AB (ref 70–99)
Glucose-Capillary: 142 mg/dL — ABNORMAL HIGH (ref 70–99)
Glucose-Capillary: 108 mg/dL — ABNORMAL HIGH (ref 70–99)
Glucose-Capillary: 121 mg/dL — ABNORMAL HIGH (ref 70–99)

## 2018-03-04 LAB — BASIC METABOLIC PANEL
ANION GAP: 5 (ref 5–15)
BUN: 9 mg/dL (ref 8–23)
CO2: 32 mmol/L (ref 22–32)
Calcium: 7.6 mg/dL — ABNORMAL LOW (ref 8.9–10.3)
Chloride: 95 mmol/L — ABNORMAL LOW (ref 98–111)
Creatinine, Ser: 0.67 mg/dL (ref 0.61–1.24)
GLUCOSE: 116 mg/dL — AB (ref 70–99)
POTASSIUM: 3.6 mmol/L (ref 3.5–5.1)
Sodium: 132 mmol/L — ABNORMAL LOW (ref 135–145)

## 2018-03-04 LAB — PHOSPHORUS: PHOSPHORUS: 2.1 mg/dL — AB (ref 2.5–4.6)

## 2018-03-04 LAB — MAGNESIUM: Magnesium: 1.9 mg/dL (ref 1.7–2.4)

## 2018-03-04 MED ORDER — METOPROLOL TARTRATE 50 MG PO TABS
100.0000 mg | ORAL_TABLET | Freq: Two times a day (BID) | ORAL | Status: DC
  Administered 2018-03-04 – 2018-03-07 (×7): 100 mg
  Filled 2018-03-04 (×9): qty 2

## 2018-03-04 MED ORDER — BISACODYL 10 MG RE SUPP
10.0000 mg | Freq: Once | RECTAL | Status: AC
  Administered 2018-03-04: 10 mg via RECTAL
  Filled 2018-03-04: qty 1

## 2018-03-04 MED ORDER — OSMOLITE 1.5 CAL PO LIQD
1000.0000 mL | ORAL | Status: DC
  Administered 2018-03-04 – 2018-03-05 (×2): 1000 mL

## 2018-03-04 MED ORDER — POTASSIUM & SODIUM PHOSPHATES 280-160-250 MG PO PACK
2.0000 | PACK | ORAL | Status: AC
  Administered 2018-03-04 (×2): 2
  Filled 2018-03-04 (×4): qty 2

## 2018-03-04 NOTE — Progress Notes (Addendum)
Daily Progress Note   Patient Name: Thomas [name]       Date: 03/04/2018 DOB: August 07, 1951  Age: 67 y.o. MRN#: [id number] Attending Physician: Hillary Bow, MD Primary Care Physician: [facility] Admit Date: 02/19/2018  Reason for Consultation/Follow-up: Establishing goals of care  Subjective: Patient is resting in bed. He denies pain. He states his daughter has been to see him. He states he may be discharged tomorrow, and is happy about D/C.   Recommend palliative to follow at D/C.  Length of Stay: 12  Current Medications: Scheduled Meds:  . Chlorhexidine Gluconate Cloth  6 each Topical Q0600  . cloNIDine  0.1 mg Transdermal Weekly  . sennosides  5 mL Per Tube Daily   And  . docusate  100 mg Per Tube Daily  . enoxaparin (LOVENOX) injection  30 mg Subcutaneous Q24H  . feeding supplement (OSMOLITE 1.5 CAL)  1,000 mL Per Tube Q24H  . fentaNYL  12.5 mcg Transdermal Q72H  . fluticasone  1 spray Each Nare Daily  . folic acid  1 mg Oral Daily  . free water  120 mL Per Tube Q3H  . levothyroxine  50 mcg Per Tube QAC breakfast  . metoprolol tartrate  100 mg Per Tube BID  . mometasone-formoterol  2 puff Inhalation BID  . multivitamin with minerals  1 tablet Oral Daily  . mupirocin ointment  1 application Nasal BID  . nicotine  14 mg Transdermal Daily  . polyethylene glycol  17 g Oral Once  . thiamine  100 mg Oral Daily   Or  . thiamine  100 mg Intravenous Daily  . venlafaxine  37.5 mg Per Tube BID WC    Continuous Infusions: . ampicillin-sulbactam (UNASYN) IV Stopped (03/04/18 1159)    PRN Meds: acetaminophen **OR** acetaminophen, bisacodyl, clonazePAM, ipratropium-albuterol, labetalol, morphine injection, ondansetron **OR** ondansetron (ZOFRAN) IV, oxyCODONE,  polyethylene glycol, traZODone  Physical Exam  Constitutional: No distress.  Thin, frail  Pulmonary/Chest: Effort normal.  Neurological: He is alert.            Vital Signs: BP (!) 159/88 (BP Location: Left Arm)   Pulse 80   Temp 99.1 F (37.3 C) (Oral)   Resp 16   Ht 5\' 7"  (1.702 m)   Wt 49.9 kg (110 lb)   SpO2 94%  BMI 17.23 kg/m  SpO2: SpO2: 94 % O2 Device: O2 Device: Nasal Cannula O2 Flow Rate: O2 Flow Rate (L/min): 2.5 L/min  Intake/output summary:   Intake/Output Summary (Last 24 hours) at 03/04/2018 1333 Last data filed at 03/03/2018 2100 Gross per 24 hour  Intake 240 ml  Output 1 ml  Net 239 ml   LBM: Last BM Date: 03/01/18 Baseline Weight: Weight: 61.2 kg (135 lb) Most recent weight: Weight: 49.9 kg (110 lb)       Palliative Assessment/Data: Tube feeds      Patient Active Problem List   Diagnosis Date Noted  . Dysphagia   . Neck mass   . Head and neck cancer ([place]) 02/19/2018  . Cervical lymphadenopathy   . Poor appetite   . Loss of weight   . History of tongue cancer   . Protein-calorie malnutrition, severe 01/22/2016  . Alcohol use disorder, severe, dependence ([place]) 12/12/2014  . Hyponatremia 12/12/2014  . Essential hypertension 12/12/2014  . COPD (chronic obstructive pulmonary disease) ([place]) 12/12/2014    Palliative Care Assessment & Plan   Patient Profile: Patient is a 67 y.o. 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 He presented for fatigue and weight loss. He presented to the ED for left side neck swelling times approximately 1 month accompanied by weight loss lethargy very poor p.o. intake loss of appetite.  Recurrent squamous cell carcinoma/head and neck cancer.   Assessment/Recommendations/Plan:   Recommend palliative outpatient.     Code  Status:    Code Status Orders  (From admission, onward)        Start     Ordered   02/19/18 1233  Full code  Continuous     02/19/18 1232    Code Status History    Date Active Date Inactive Code Status Order ID Comments User Context   02/19/2016 1355 02/22/2016 2000 Full Code [id number]  Lytle Butte, MD ED   02/07/2016 0230 02/09/2016 1859 Full Code [id number]  Harrie Foreman, MD Inpatient   01/22/2016 0552 01/26/2016 1805 Full Code [id number]  Harrie Foreman, MD Inpatient   08/17/2015 2229 08/21/2015 1800 Full Code [id number]  Lance Coon, MD Inpatient   12/12/2014 0119 12/18/2014 1659 Full Code [id number]  Hower, Aaron Mose, MD ED       Prognosis:   Unable to determine  Discharge Planning:  To Be Determined   Thank you for allowing the Palliative Medicine Team to assist in the care of this patient.   Total Time 15 min Prolonged Time Billed  no      Greater than 50%  of this time was spent counseling and coordinating care related to the above assessment and plan.  Asencion Gowda, NP  Please contact Palliative Medicine Team phone at [phone] for questions and concerns.

## 2018-03-04 NOTE — Progress Notes (Signed)
[facility] at [place] NAME: Thomas [name]    MR#:  [id number]  DATE OF BIRTH:  September 02, 1950  SUBJECTIVE:  CHIEF COMPLAINT:   Chief Complaint  Patient presents with  . Neck Pain   Tube feeds. In NSR Feels weak  REVIEW OF SYSTEMS:    ROS  CONSTITUTIONAL: No documented fever. Has fatigue, weakness. No weight gain, has weight loss.  EYES: No blurry or double vision.  ENT: No tinnitus. No postnasal drip. No redness of the oropharynx.  Has difficulty swallowing food RESPIRATORY: No cough, no wheeze, no hemoptysis. No dyspnea.  CARDIOVASCULAR: No chest pain. No orthopnea. No palpitations. No syncope.  GASTROINTESTINAL: No nausea, no vomiting or diarrhea. No abdominal pain. No melena or hematochezia.  GENITOURINARY: No dysuria or hematuria.  ENDOCRINE: No polyuria or nocturia. No heat or cold intolerance.  HEMATOLOGY: No anemia. No bruising. No bleeding.  INTEGUMENTARY: No rashes. No lesions.  MUSCULOSKELETAL: No arthritis. No swelling. No gout.  NEUROLOGIC: No numbness, tingling, or ataxia. No seizure-type activity.  PSYCHIATRIC: No anxiety. No insomnia. No ADD.   DRUG ALLERGIES:  No Known Allergies  VITALS:  Blood pressure (!) 159/88, pulse 80, temperature 99.1 F (37.3 C), temperature source Oral, resp. rate 16, height 5\' 7"  (1.702 m), weight 49.9 kg (110 lb), SpO2 94 %.  PHYSICAL EXAMINATION:   Physical Exam  GENERAL:  67 y.o.-year-old cachectic male patient lying in the bed with no acute distress.  EYES: Pupils equal, round, reactive to light and accommodation. No scleral icterus. Extraocular muscles intact.  HEENT: Head atraumatic, normocephalic. Oropharynx and nasopharynx clear.  Cervical lymphadenopathy NECK:  Supple, no jugular venous distention. No thyroid enlargement, no tenderness.  LUNGS: Normal breath sounds bilaterally, no wheezing, rales, rhonchi. No use of accessory muscles of respiration.  CARDIOVASCULAR: S1, S2  normal. No murmurs, rubs, or gallops.  ABDOMEN: Soft, nontender, nondistended. Bowel sounds present. No organomegaly or mass. PEG in place. EXTREMITIES: No cyanosis, clubbing or edema b/l.    NEUROLOGIC: Cranial nerves II through XII are intact. No focal Motor or sensory deficits b/l.  Generalized weakness. PSYCHIATRIC: The patient is alert and oriented x 3.  SKIN: No obvious rash, lesion, or ulcer.   LABORATORY PANEL:   CBC Recent Labs  Lab 03/02/18 0430  WBC 17.0*  HGB 8.8*  HCT 26.9*  PLT 236   ------------------------------------------------------------------------------------------------------------------ Chemistries  Recent Labs  Lab 03/03/18 0525 03/04/18 0440  NA 136 132*  K 3.3* 3.6  CL 99 95*  CO2 33* 32  GLUCOSE 115* 116*  BUN 12 9  CREATININE 0.83 0.67  CALCIUM 7.7* 7.6*  MG 1.7 1.9  AST 18  --   ALT 14  --   ALKPHOS 44  --   BILITOT 0.5  --    ------------------------------------------------------------------------------------------------------------------  Cardiac Enzymes No results for input(s): TROPONINI in the last 168 hours. ------------------------------------------------------------------------------------------------------------------  RADIOLOGY:  No results found.   ASSESSMENT AND PLAN:  67 year old male patient with history of head and neck cancer bipolar disorder, COPD, chronic kidney disease stage III currently under hospitalist service for severe malnutrition, cervical lymphadenopathy and low sodium level  - Aspiration pneumonia   On unasyn. Changed to Augmentin through PEG tube.  -Dysphagia PEG could not be placed by IR or GI.  Surgical team help appreciated with PEG tube placement.  Started on tube feeds .  - A fib with RVR due to acute respiratory failure.  Resolved.  Will discontinue amiodarone.  Start metoprolol.  -  Cervical lymphadenopathy/mass S/p CT guided neck biopsy Path results revealed metastatic squamous cell  carcinoma Follow-up with cancer center  - Head and neck cancer Oncology follow-up as outpatient  palliative care to help with goals of care discussion.  -Hyponatremia - improved  SIADH  Off Salt tablets  -Accidental fall Pelvic x-ray no evidence of fracture Physical therapy evaluation for endurance training SNF at discharge  -Severe malnutrition Nutritional supplements Start feeding via tube .  -COPD stable  -Tobacco cessation Counseled to quit on admission  -Alcohol abuse Counseled on admission   All the records are reviewed and case discussed with Care Management/Social Worker. Management plans discussed with the patient, family and they are in agreement.  CODE STATUS: Full code.  DVT Prophylaxis: SCDs  TOTAL TIME TAKING CARE OF THIS PATIENT: 35 minutes.   Thomas [name] Thomas [name] M.D on 03/04/2018 at 1:26 PM  Between 7am to 6pm - Pager - [phone]  After 6pm go to [email or web address] - password [email or web address] [facility]  Office  [phone]  CC: Primary care physician; [name]  Note: This dictation was prepared with Dragon dictation along with smaller phrase technology. Any transcriptional errors that result from this process are unintentional.

## 2018-03-04 NOTE — Progress Notes (Signed)
Pharmacy Electrolyte Monitoring Consult:  Pharmacy consulted to assist in monitoring and replacing electrolytes in this 67 y.o. male with recurrent head and neck cancer with AF converted with amiodarone.   Labs:  Sodium (mmol/L)  Date Value  03/04/2018 132 (L)  02/22/2013 137   Potassium (mmol/L)  Date Value  03/04/2018 3.6  02/22/2013 4.6   Magnesium (mg/dL)  Date Value  03/04/2018 1.9  02/22/2013 2.1   Phosphorus (mg/dL)  Date Value  03/04/2018 2.1 (L)   Calcium (mg/dL)  Date Value  03/04/2018 7.6 (L)   Calcium, Total (mg/dL)  Date Value  02/22/2013 8.9   Albumin (g/dL)  Date Value  03/03/2018 2.0 (L)  02/19/2013 3.6    Assessment/Plan: PhosNaK 2 packets x 2 and will f/u AM labs.   Ulice Dash D 03/04/2018 9:22 AM

## 2018-03-04 NOTE — Progress Notes (Signed)
PT Cancellation Note  Patient Details Name: ESPIRIDION SUPINSKI MRN: [id number] DOB: 1951-01-08   Cancelled Treatment:    Reason Eval/Treat Not Completed: Other (comment). Reason Eval/Treat Not Completed: Medical issues which prohibited therapy. Chart reviewed. Pt transferred to ICU on 7/26 for respiratory distress and possible aspiration. Per therapy protocol due to pt's change in status with transfer to ICU, will require new PT order to continue therapy. Will complete current order. Noted pt out of CCU at this time. Please re-order PT for services to resume.      Yeshua Stryker 03/04/2018, 7:56 AM  Greggory Stallion, PT, DPT [phone]

## 2018-03-04 NOTE — Progress Notes (Signed)
Nutrition Follow-up Note   DOCUMENTATION CODES:   Severe malnutrition in context of chronic illness, Underweight  INTERVENTION:   Increase Osmolite 1.5 to rate of 16m/hr. If patient tolerating tube feeds recommend increase by 10 mL/hr every 8 hours until goal rate of 50 mL/hr is reached.   Regimen provides 1800 kcal, 75 grams of protein, 912 mL H2O daily.  Provide free water flush of 120 mL Q3hrs on pump. Provides a total of 1872 mL H2O daily including water in tube feeding.  Continue MVI daily, folic acid 1 mg daily, thiamine 100 mg daily.  Pt at high refeeding risk; recommend monitor K, Mg and P labs  Bowel regimen per MD   NUTRITION DIAGNOSIS:   Severe Malnutrition related to chronic illness(COPD, CKD stage III, EtOH abuse, hx H&N cancer, cervical lymphadenopathy concerning for recurrence/metastasis) as evidenced by severe fat depletion, severe muscle depletion.  Ongoing - addressing with TF regimen.  GOAL:   Patient will meet greater than or equal to 90% of their needs  Met with TF regimen.  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   Access: 16 Fr. KSolon PalmG-tube with 20 cc balloon placed laparoscopically by surgeon on 7/24  Pt unable to tolerate bolus feeds; was changed to continuous feeds last night. Spoke  to RN, pt tolerating tube feeds with no nausea or vomiting but now complaining of abdominal pain and possible constipation this morning. Will initiate bowel regimen per RN. Will plan to advance tube feeds slowly. Pt refeeding; pharmacy following for electrolyte management. Per chart, pt is weight stable since admit.    Medications reviewed and include: senokot, lovenox, fentanyl, folic acid, synthroid, nicotine, miralax, KPhos, thiamine, unasyn, oxycodone    Labs reviewed: Na 132(L), Cl 95(L), Ca 7.6(L), P 2.1(L), Mg 1.9 wnl cbgs- 110, 117, 109, 106, 103 x 24 hrs  Diet Order:   Diet Order           Diet NPO time specified  Diet effective now          EDUCATION NEEDS:   Not appropriate for education at this time  Skin:  Skin Assessment: Reviewed RN Assessment(ecchymosis to bilateral arms)  Last BM:  7/26- per pt  Height:   Ht Readings from Last 1 Encounters:  02/27/18 5' 7"  (1.702 m)    Weight:   Wt Readings from Last 1 Encounters:  02/27/18 110 lb (49.9 kg)    Ideal Body Weight:  67.3 kg  BMI:  Body mass index is 17.23 kg/m.  Estimated Nutritional Needs:   Kcal:  1630-1880 (MSJ x 1.3-1.5)  Protein:  75-90 grams (1.5-1.8 grams/kg)  Fluid:  1.6-1.9 L/day (1 mL/kcal)  CKoleen DistanceMS, RD, LDN Pager #- [phone]#- [phone] Hours Pager: [phone]

## 2018-03-04 NOTE — Care Management Important Message (Signed)
Important Message  Patient Details  Name: Thomas [name] MRN: [id number] Date of Birth: [birth year]   Medicare Important Message Given:  Yes    Shelbie Ammons, RN 03/04/2018, 1:16 PM

## 2018-03-05 LAB — BASIC METABOLIC PANEL
ANION GAP: 5 (ref 5–15)
BUN: 11 mg/dL (ref 8–23)
CHLORIDE: 91 mmol/L — AB (ref 98–111)
CO2: 32 mmol/L (ref 22–32)
Calcium: 7.6 mg/dL — ABNORMAL LOW (ref 8.9–10.3)
Creatinine, Ser: 0.57 mg/dL — ABNORMAL LOW (ref 0.61–1.24)
GFR calc Af Amer: >60 mL/min (ref >60)
GFR calc non Af Amer: >60 mL/min (ref >60)
GLUCOSE: 130 mg/dL — AB (ref 70–99)
POTASSIUM: 3.5 mmol/L (ref 3.5–5.1)
Sodium: 128 mmol/L — ABNORMAL LOW (ref 135–145)

## 2018-03-05 LAB — GLUCOSE, CAPILLARY
Glucose-Capillary: 127 mg/dL — ABNORMAL HIGH (ref 70–99)
Glucose-Capillary: 98 mg/dL (ref 70–99)
Glucose-Capillary: 113 mg/dL — ABNORMAL HIGH (ref 70–99)
Glucose-Capillary: 118 mg/dL — ABNORMAL HIGH (ref 70–99)
Glucose-Capillary: 132 mg/dL — ABNORMAL HIGH (ref 70–99)

## 2018-03-05 LAB — MAGNESIUM: Magnesium: 1.9 mg/dL (ref 1.7–2.4)

## 2018-03-05 LAB — PHOSPHORUS: Phosphorus: 2.3 mg/dL — ABNORMAL LOW (ref 2.5–4.6)

## 2018-03-05 MED ORDER — AMLODIPINE BESYLATE 5 MG PO TABS
5.0000 mg | ORAL_TABLET | Freq: Every day | ORAL | Status: DC
  Administered 2018-03-05 – 2018-03-08 (×4): 5 mg
  Filled 2018-03-05 (×4): qty 1

## 2018-03-05 MED ORDER — K PHOS MONO-SOD PHOS DI & MONO 155-852-130 MG PO TABS
500.0000 mg | ORAL_TABLET | ORAL | Status: AC
  Administered 2018-03-05 (×2): 500 mg via ORAL
  Filled 2018-03-05 (×2): qty 2

## 2018-03-05 MED ORDER — AMOXICILLIN-POT CLAVULANATE 400-57 MG/5ML PO SUSR
800.0000 mg | Freq: Two times a day (BID) | ORAL | Status: DC
  Administered 2018-03-05 – 2018-03-08 (×6): 800 mg
  Filled 2018-03-05 (×10): qty 10

## 2018-03-05 MED ORDER — OSMOLITE 1.5 CAL PO LIQD: 1000.0000 mL | ORAL | Status: DC

## 2018-03-05 MED ORDER — FREE WATER
50.0000 mL | Status: DC
  Administered 2018-03-05 – 2018-03-08 (×17): 50 mL

## 2018-03-05 MED ORDER — OSMOLITE 1.5 CAL PO LIQD
1000.0000 mL | ORAL | Status: DC
  Administered 2018-03-05: 1000 mL

## 2018-03-05 NOTE — Care Management (Signed)
Reached out to attending regarding outpatient palliative when discharges to the skilled nursing facility for goals of care

## 2018-03-05 NOTE — Progress Notes (Signed)
Patient reported to be unstable and dizzy during PT treatment.  Orthostatics done short after.  He went from 128/67 lying, to 102/71 sitting, and then 92/57 standing.  Text page sent to Dr. Darvin Neighbours

## 2018-03-05 NOTE — Progress Notes (Addendum)
Thomas [name] at [place] NAME: Thomas [name]    MR#:  [id number]  DATE OF BIRTH:  20-Oct-1950  SUBJECTIVE:  CHIEF COMPLAINT:   Chief Complaint  Patient presents with  . Neck Pain   Tube feeds at 30 mL/h Complaints of lower abdominal pain.  Urinary retention.  Bladder scan with 1100 mL urine.  REVIEW OF SYSTEMS:    ROS  CONSTITUTIONAL: No documented fever. Has fatigue, weakness. No weight gain, has weight loss.  EYES: No blurry or double vision.  ENT: No tinnitus. No postnasal drip. No redness of the oropharynx.  Has difficulty swallowing food RESPIRATORY: No cough, no wheeze, no hemoptysis. No dyspnea.  CARDIOVASCULAR: No chest pain. No orthopnea. No palpitations. No syncope.  GASTROINTESTINAL: No nausea, no vomiting or diarrhea. No abdominal pain. No melena or hematochezia.  GENITOURINARY: No dysuria or hematuria.  ENDOCRINE: No polyuria or nocturia. No heat or cold intolerance.  HEMATOLOGY: No anemia. No bruising. No bleeding.  INTEGUMENTARY: No rashes. No lesions.  MUSCULOSKELETAL: No arthritis. No swelling. No gout.  NEUROLOGIC: No numbness, tingling, or ataxia. No seizure-type activity.  PSYCHIATRIC: No anxiety. No insomnia. No ADD.   DRUG ALLERGIES:  No Known Allergies  VITALS:  Blood pressure (!) 152/79, pulse 88, temperature 97.6 F (36.4 C), temperature source Oral, resp. rate (!) 22, height 5\' 7"  (1.702 m), weight 49.9 kg (110 lb), SpO2 94 %.  PHYSICAL EXAMINATION:   Physical Exam  GENERAL:  67 y.o.-year-old cachectic male patient lying in the bed with no acute distress.  EYES: Pupils equal, round, reactive to light and accommodation. No scleral icterus. Extraocular muscles intact.  HEENT: Head atraumatic, normocephalic. Oropharynx and nasopharynx clear.  Cervical lymphadenopathy NECK:  Supple, no jugular venous distention. No thyroid enlargement, no tenderness.  LUNGS: Bilateral coarse breath sounds with  wheezing CARDIOVASCULAR: S1, S2 normal. No murmurs, rubs, or gallops.  ABDOMEN: Soft, nontender, nondistended. Bowel sounds present. No organomegaly or mass. PEG in place. Condom catheter in place EXTREMITIES: No cyanosis, clubbing or edema b/l.    NEUROLOGIC: Cranial nerves II through XII are intact. No focal Motor or sensory deficits b/l.  Generalized weakness. PSYCHIATRIC: The patient is alert and oriented x 3.  SKIN: No obvious rash, lesion, or ulcer.   LABORATORY PANEL:   CBC Recent Labs  Lab 03/02/18 0430  WBC 17.0*  HGB 8.8*  HCT 26.9*  PLT 236   ------------------------------------------------------------------------------------------------------------------ Chemistries  Recent Labs  Lab 03/03/18 0525  03/05/18 0455  NA 136   < > 128*  K 3.3*   < > 3.5  CL 99   < > 91*  CO2 33*   < > 32  GLUCOSE 115*   < > 130*  BUN 12   < > 11  CREATININE 0.83   < > 0.57*  CALCIUM 7.7*   < > 7.6*  MG 1.7   < > 1.9  AST 18  --   --   ALT 14  --   --   ALKPHOS 44  --   --   BILITOT 0.5  --   --    < > = values in this interval not displayed.   ------------------------------------------------------------------------------------------------------------------  Cardiac Enzymes No results for input(s): TROPONINI in the last 168 hours. ------------------------------------------------------------------------------------------------------------------  RADIOLOGY:  No results found.   ASSESSMENT AND PLAN:  67 year old male patient with history of head and neck cancer bipolar disorder, COPD, chronic kidney disease stage III currently under hospitalist service for  severe malnutrition, cervical lymphadenopathy and low sodium level  - Aspiration pneumonia   On unasyn. Change to Augmentin through PEG tube.  -Dysphagia PEG could not be placed by IR or GI.  Surgical team help appreciated with PEG tube placement.  Started on tube feeds .  - A fib with RVR due to acute respiratory  failure.  Resolved.  Discontinued amiodarone and started on metoprolol. Normal sinus rhythm.  No anticoagulation needed.  -Cervical lymphadenopathy/mass S/p CT guided neck biopsy Path results revealed metastatic squamous cell carcinoma Follow-up with cancer center  - Head and neck cancer Oncology follow-up as outpatient  palliative care to help with goals of care discussion.  -Hyponatremia - improved  SIADH  Off Salt tablets  -Accidental fall Pelvic x-ray no evidence of fracture Physical therapy evaluation for endurance training SNF at discharge  -Severe malnutrition Nutritional supplements Start feeding via tube .  -COPD stable  -Tobacco cessation Counseled to quit on admission  -Alcohol abuse Counseled on admission  -Urinary retention.  In and out catheter. Will need Foley if persistent retention.   All the records are reviewed and case discussed with Care Management/Social Worker. Management plans discussed with the patient, family and they are in agreement.  CODE STATUS: Full code.  DVT Prophylaxis: SCDs  TOTAL TIME TAKING CARE OF THIS PATIENT: 35 minutes.   [name] M.D on 03/05/2018 at 9:35 AM  Between 7am to 6pm - Pager - [phone]  After 6pm go to [email or web address] - password [email or web address] [facility]  Office  [phone]  CC: Primary care physician; [facility]  Note: This dictation was prepared with Dragon dictation along with smaller phrase technology. Any transcriptional errors that result from this process are unintentional.

## 2018-03-05 NOTE — Care Management (Signed)
Informed anticipated discharge today postponed due to urinary retention. In and out cath 500 cc and the second 1250.  Patient may have to discharge to skilled facility with foley if retention persists. Tolerating tube feedings

## 2018-03-05 NOTE — Progress Notes (Signed)
Daily Progress Note   Patient Name: Thomas [name]       Date: 03/05/2018 DOB: [birth year]  Age: 67 y.o. MRN#: [id number] Attending Physician: Hillary Bow, MD Primary Care Physician: [facility] Admit Date: 02/19/2018  Reason for Consultation/Follow-up: Establishing goals of care  Subjective: Patient not D/C 'd today due to urinary retention. Patient standing up from bed with therapy, gait belt in place. Patient has had a BM in bed. Upon standing he was unsteady, and complains of dizziness, PT assisting. He stood to be cleaned and moved to [facility]. He continued to feel dizzy. O2 sat checked by PT, was 96%. RN made aware. Recommend orthostatic VS.   Recommend palliative to follow at D/C.  Length of Stay: 13  Current Medications: Scheduled Meds:  . amLODipine  5 mg Per Tube Daily  . amoxicillin-clavulanate  800 mg Per Tube Q12H  . Chlorhexidine Gluconate Cloth  6 each Topical Q0600  . cloNIDine  0.1 mg Transdermal Weekly  . sennosides  5 mL Per Tube Daily   And  . docusate  100 mg Per Tube Daily  . enoxaparin (LOVENOX) injection  30 mg Subcutaneous Q24H  . feeding supplement (OSMOLITE 1.5 CAL)  1,000 mL Per Tube Q24H  . fentaNYL  12.5 mcg Transdermal Q72H  . fluticasone  1 spray Each Nare Daily  . folic acid  1 mg Oral Daily  . free water  50 mL Per Tube Q4H  . levothyroxine  50 mcg Per Tube QAC breakfast  . metoprolol tartrate  100 mg Per Tube BID  . mometasone-formoterol  2 puff Inhalation BID  . multivitamin with minerals  1 tablet Oral Daily  . mupirocin ointment  1 application Nasal BID  . nicotine  14 mg Transdermal Daily  . thiamine  100 mg Oral Daily   Or  . thiamine  100 mg Intravenous Daily  . venlafaxine  37.5 mg Per Tube BID WC    Continuous  Infusions:   PRN Meds: acetaminophen **OR** acetaminophen, bisacodyl, clonazePAM, ipratropium-albuterol, labetalol, morphine injection, ondansetron **OR** ondansetron (ZOFRAN) IV, oxyCODONE, polyethylene glycol, traZODone  Physical Exam  Constitutional: No distress.  Thin, frail  Pulmonary/Chest: Effort normal.  Neurological: He is alert.            Vital Signs: BP (!) 148/76  Pulse 92   Temp 97.6 F (36.4 C) (Oral)   Resp (!) 22   Ht 5\' 7"  (1.702 m)   Wt 49.9 kg (110 lb)   SpO2 94%   BMI 17.23 kg/m  SpO2: SpO2: 94 % O2 Device: O2 Device: Nasal Cannula O2 Flow Rate: O2 Flow Rate (L/min): 2 L/min  Intake/output summary:   Intake/Output Summary (Last 24 hours) at 03/05/2018 1501 Last data filed at 03/05/2018 0712 Gross per 24 hour  Intake -  Output 1280 ml  Net -1280 ml   LBM: Last BM Date: 03/05/18 Baseline Weight: Weight: 61.2 kg (135 lb) Most recent weight: Weight: 49.9 kg (110 lb)       Palliative Assessment/Data: Tube feeds      Patient Active Problem List   Diagnosis Date Noted  . Dysphagia   . Neck mass   . Head and neck cancer ([place]) 02/19/2018  . Cervical lymphadenopathy   . Poor appetite   . Loss of weight   . History of tongue cancer   . Protein-calorie malnutrition, severe 01/22/2016  . Alcohol use disorder, severe, dependence ([place]) 12/12/2014  . Hyponatremia 12/12/2014  . Essential hypertension 12/12/2014  . COPD (chronic obstructive pulmonary disease) ([place]) 12/12/2014    Palliative Care Assessment & Plan   Patient Profile: Patient is a 67 y.o. 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 He presented for fatigue and weight loss. He presented to the ED for left side neck swelling times approximately 1 month accompanied by weight loss lethargy very poor p.o.  intake loss of appetite.  Recurrent squamous cell carcinoma/head and neck cancer.   Assessment/Recommendations/Plan:  Recommend orthostatic VS due to dizziness with standing.  Recommend palliative outpatient.     Code Status:    Code Status Orders  (From admission, onward)        Start     Ordered   02/19/18 1233  Full code  Continuous     02/19/18 1232    Code Status History    Date Active Date Inactive Code Status Order ID Comments User Context   02/19/2016 1355 02/22/2016 2000 Full Code [id number]  Lytle Butte, MD ED   02/07/2016 0230 02/09/2016 1859 Full Code [id number]  Harrie Foreman, MD Inpatient   01/22/2016 0552 01/26/2016 1805 Full Code [id number]  Harrie Foreman, MD Inpatient   08/17/2015 2229 08/21/2015 1800 Full Code [id number]  Lance Coon, MD Inpatient   12/12/2014 0119 12/18/2014 1659 Full Code [id number]  Hower, Aaron Mose, MD ED       Prognosis:   Unable to determine  Discharge Planning:  To Be Determined   Thank you for allowing the Palliative Medicine Team to assist in the care of this patient.   Total Time 15 min Prolonged Time Billed  no      Greater than 50%  of this time was spent counseling and coordinating care related to the above assessment and plan.  Asencion Gowda, NP  Please contact Palliative Medicine Team phone at [phone] for questions and concerns.

## 2018-03-05 NOTE — Care Management (Signed)
Per CSW, the skilled nursing facility is requesting updated physical therapy notes before accepting patient.  The physical therapy order was discontinued due to a transfer to ICU. CSW has contacted therapy to assess patient today

## 2018-03-05 NOTE — Progress Notes (Signed)
Patient had a loose stool yesterday, another during the night and 2 today.  Likely related to tube feeding.

## 2018-03-05 NOTE — Progress Notes (Signed)
BP now 138/70.  Will continue to wait before removing more urine.

## 2018-03-05 NOTE — Evaluation (Addendum)
Physical Therapy Re-Evaluation Patient Details Name: Thomas [name] MRN: [id number] DOB: [birth year] Today's Date: 03/05/2018   History of Present Illness  Patient is 67 yo male presented to ED on 7/16 with complaints of left neck swelling 1 month, weight loss, poor by mouth intake with PMH of stage IV squamous cell carcinoma of the tongue base, s/p concurrent chemoradiation and neck dissection, stage II squamous cell carcinoma of the nose, s/p partial rhinectomy with skin graft, HTN. CT showed necrotic lymphadenopathy, metastatic. PEG placement 7/16, transferred to ICU 7/26 for respiratory distress/aspiration.   Clinical Impression  Patient oriented to self and place at start of session no immediate complaints of pain. Patient is poor/questionable historian and presents with severely garbled speech. Patient able to state that he lives in a mobile home with his daughter "most of the time" but unable to elaborate. Patient unclear about PLOF and per chart review patient may be homeless, and was independent 1 year ago.  The patient demonstrated grossly generalized weakness of both UE and LE, and fatigued quickly with bed level exercises. Supine to sit transfer with supervision, and able to stand 3-5 minutes with RW & CGA for nursing care. Patient pushed RW out of way to get to bedside commode exhibiting impulsivity and unsteadiness, CGA from PT. Patient also complained of dizziness and fatigue with standing, vitals stable. Patient ambulated to bed with improved use of AD with PT management. The patient would benefit from further skilled PT to address deficits in strength, endurance, activity tolerance, gait, balance and mobility to maximize independence, mobility, safety, and for continued education.     Follow Up Recommendations SNF    Equipment Recommendations  Rolling walker with 5" wheels    Recommendations for Other Services       Precautions / Restrictions Precautions Precautions: Fall       Mobility  Bed Mobility Overal bed mobility: Needs Assistance Bed Mobility: Supine to Sit;Sit to Supine     Supine to sit: Supervision Sit to supine: Supervision      Transfers Overall transfer level: Needs assistance Equipment used: Rolling walker (2 wheeled);None Transfers: Sit to/from Stand Sit to Stand: Min guard         General transfer comment: use of handrails when accessible or RW for transfers.  Ambulation/Gait Ambulation/Gait assistance: Min guard Gait Distance (Feet): 5 Feet Assistive device: None;Rolling walker (2 wheeled)       General Gait Details: Patient demonstrates unsteadiness with ambulation. Pushes walker away, does not want to use. Complaints of dizziness and exhibits fatigue in standing and with ambulation to bedside commode.   Stairs            Wheelchair Mobility    Modified Rankin (Stroke Patients Only)       Balance Overall balance assessment: Needs assistance Sitting-balance support: Feet supported Sitting balance-Leahy Scale: Good       Standing balance-Leahy Scale: Fair Standing balance comment: Patient able to stand without support, but reaches for IV pole.                              Pertinent Vitals/Pain Pain Assessment: Faces Faces Pain Scale: Hurts even more Pain Location: Neck and stomach Pain Descriptors / Indicators: Aching;Grimacing Pain Intervention(s): Limited activity within patient's tolerance;Monitored during session;Repositioned    Home Living Family/patient expects to be discharged to:: Private residence Living Arrangements: Children Available Help at Discharge: Family Type of Home: Mobile home Home Access: Stairs  to enter         Additional Comments: Patient reports that he "most often" lives with his daughter. States that this is a mobile home and she may or may not be around to help. Patient is poor historian, presenting some conflicting information.     Prior Function            Comments: Pt unable to answer PLOF info, PT eval from [facility]YA reports pt was independent, states that he has used a walker, but then pushes walker out of his way during mobility.     Hand Dominance        Extremity/Trunk Assessment   Upper Extremity Assessment RUE Deficits / Details: grossly 3+/5 LUE Deficits / Details: grossley 3+/5    Lower Extremity Assessment Lower Extremity Assessment: RLE deficits/detail;LLE deficits/detail;Generalized weakness RLE Deficits / Details: not formally assessed, but patient able to perform heel slides, ankle pumps, and SLR but fatigued quickly LLE Deficits / Details: not formally assessed, but patient able to perform heel slides, ankle pumps, and SLR but fatigued quickly       Communication   Communication: HOH(garbled speech)  Cognition Arousal/Alertness: Lethargic Behavior During Therapy: WFL for tasks assessed/performed Overall Cognitive Status: No family/caregiver present to determine baseline cognitive functioning                                 General Comments: Patient able to follow commands with increased time, occasional cues to attend to task/redirecting, or needed a question/request repeated.       General Comments      Exercises Other Exercises Other Exercises: Patient demonstrated bedside commode transfer. Also perform ankle pumps, SLR and heelslides during session, 15-20 b/l each exercise.   Assessment/Plan    PT Assessment Patient needs continued PT services  PT Problem List Decreased strength;Decreased activity tolerance;Decreased balance;Decreased mobility;Pain       PT Treatment Interventions DME instruction;Balance training;Gait training;Functional mobility training;Therapeutic activities;Therapeutic exercise;Patient/family education;Stair training    PT Goals (Current goals can be found in the Care Plan section)  Acute Rehab PT Goals Patient Stated Goal: Patient wants to decrease stomach/neck  pain PT Goal Formulation: With patient Time For Goal Achievement: 03/19/18 Potential to Achieve Goals: Fair    Frequency Min 2X/week   Barriers to discharge Other (comment)(Per chart review, patient may be homeless)      Co-evaluation               AM-PAC PT "6 Clicks" Daily Activity  Outcome Measure Difficulty turning over in bed (including adjusting bedclothes, sheets and blankets)?: A Little Difficulty moving from lying on back to sitting on the side of the bed? : A Little Difficulty sitting down on and standing up from a chair with arms (e.g., wheelchair, bedside commode, etc,.)?: Unable Help needed moving to and from a bed to chair (including a wheelchair)?: A Little Help needed walking in hospital room?: A Little Help needed climbing 3-5 steps with a railing? : A Lot 6 Click Score: 15    End of Session Equipment Utilized During Treatment: Gait belt;Oxygen Activity Tolerance: Patient limited by pain;Patient limited by fatigue Patient left: in bed;with call bell/phone within reach;with bed alarm set Nurse Communication: Mobility status PT Visit Diagnosis: Unsteadiness on feet (R26.81);Other abnormalities of gait and mobility (R26.89);Difficulty in walking, not elsewhere classified (R26.2);Muscle weakness (generalized) (M62.81)    Time: 1194-1740 PT Time Calculation (min) (ACUTE ONLY): 38 min  Charges:   PT Evaluation $PT Re-evaluation: 1 Re-eval PT Treatments $Therapeutic Exercise: 8-22 mins $Therapeutic Activity: 8-22 mins    Lieutenant Diego PT, DPT 3:27 PM,03/05/18 [phone]

## 2018-03-05 NOTE — Progress Notes (Signed)
Pharmacy Electrolyte Monitoring Consult:  Pharmacy consulted to assist in monitoring and replacing electrolytes in this 67 y.o. male with recurrent head and neck cancer with AF converted with amiodarone.   Labs:  Sodium (mmol/L)  Date Value  03/05/2018 128 (L)  02/22/2013 137   Potassium (mmol/L)  Date Value  03/05/2018 3.5  02/22/2013 4.6   Magnesium (mg/dL)  Date Value  03/05/2018 1.9  02/22/2013 2.1   Phosphorus (mg/dL)  Date Value  03/05/2018 2.3 (L)   Calcium (mg/dL)  Date Value  03/05/2018 7.6 (L)   Calcium, Total (mg/dL)  Date Value  02/22/2013 8.9   Albumin (g/dL)  Date Value  03/03/2018 2.0 (L)  02/19/2013 3.6    Assessment/Plan: 7/30 Phos: 2.3. Will replace with KPhos 500mg  every 4 hours x 2 doses.  Recheck electrolytes with AM labs and continue to replace as needed.   Pernell Dupre, PharmD, BCPS Clinical Pharmacist 03/05/2018 7:20 AM

## 2018-03-05 NOTE — Progress Notes (Signed)
Patient c/o not being able to urinate.  No recorded output since yesterday 1412 yesterday.  1,375 recorded from condom cath bag.  Lower abdomin firm and distended.  Bladder scan 1,157.  In and out cath for 500 ml.  Stopped at this point so patient could acclimate to the change.  BP change from 162/85 to 154/71.

## 2018-03-05 NOTE — NC FL2 (Addendum)
[place] LEVEL OF CARE SCREENING TOOL     IDENTIFICATION  Patient Name: Thomas [name] Birthdate: 1950-11-03 Sex: male Admission Date (Current Location): 02/19/2018  [place] and [place] Number:  Engineering geologist and Address:  [facility], [address] [place], [place] [address]      Provider Number: [id number]  Attending Physician Name and Address:  Charlies Constable, MD  Relative Name and Phone Number:  Langley [facility]- Daughter [phone]    Current Level of Care: Hospital Recommended Level of Care: [place] Prior Approval Number:    Date Approved/Denied:   PASRR Number: [id number]  Discharge Plan: SNF    Current Diagnoses: Patient Active Problem List   Diagnosis Date Noted  . Dysphagia   . Neck mass   . Head and neck cancer ([place]) 02/19/2018  . Cervical lymphadenopathy   . Poor appetite   . Loss of weight   . History of tongue cancer   . Protein-calorie malnutrition, severe 01/22/2016  . Alcohol use disorder, severe, dependence ([place]) 12/12/2014  . Hyponatremia 12/12/2014  . Essential hypertension 12/12/2014  . COPD (chronic obstructive pulmonary disease) ([place]) 12/12/2014    Orientation RESPIRATION BLADDER Height & Weight     Self, Time, Place  O2(2L) Continent Weight: 110 lb (49.9 kg) Height:  5\' 7"  (170.2 cm)  BEHAVIORAL SYMPTOMS/MOOD NEUROLOGICAL BOWEL NUTRITION STATUS  (none) (none) Continent Feeding tube  AMBULATORY STATUS COMMUNICATION OF NEEDS Skin   Extensive Assist Verbally Surgical wounds                       Personal Care Assistance Level of Assistance  Bathing, Feeding, Dressing Bathing Assistance: Limited assistance Feeding assistance: Feeding tube Dressing Assistance: Limited assistance     Functional Limitations Info  Sight, Hearing, Speech Sight Info: Adequate Hearing Info: Adequate Speech Info: Adequate    SPECIAL CARE FACTORS FREQUENCY  PT (By licensed PT), OT  (By licensed OT)     PT Frequency: 5x a week OT Frequency: 5x a week            Contractures Contractures Info: Not present    Additional Factors Info  Code Status, Allergies, Psychotropic, Isolation Precautions Code Status Info: Full Code Allergies Info: NKA Psychotropic Info: venlafaxine (EFFEXOR) tablet 37.5 mg    Isolation Precautions Info: Contact precautions for history of MRSA.     Current Medications (03/05/2018):  This is the current hospital active medication list Current Facility-Administered Medications  Medication Dose Route Frequency Provider Last Rate Last Dose  . acetaminophen (TYLENOL) tablet 650 mg  650 mg Oral Q6H PRN Lysle Pearl, Isami, DO       Or  . acetaminophen (TYLENOL) suppository 650 mg  650 mg Rectal Q6H PRN Sakai, Isami, DO   650 mg at 02/25/18 0400  . amLODipine (NORVASC) tablet 5 mg  5 mg Per Tube Daily Hillary Bow, MD   5 mg at 03/05/18 1007  . amoxicillin-clavulanate (AUGMENTIN) 400-57 MG/5ML suspension 800 mg  800 mg Per Tube Q12H Sudini, Alveta Heimlich, MD   800 mg at 03/05/18 1004  . bisacodyl (DULCOLAX) EC tablet 5 mg  5 mg Oral Daily PRN Lysle Pearl, Isami, DO      . Chlorhexidine Gluconate Cloth 2 % PADS 6 each  6 each Topical Q0600 Conforti, John, DO   6 each at 03/05/18 0539  . clonazePAM (KLONOPIN) tablet 1 mg  1 mg Oral TID PRN Lysle Pearl, Isami, DO   1 mg at 03/01/18  0942  . cloNIDine (CATAPRES - Dosed in mg/24 hr) patch 0.1 mg  0.1 mg Transdermal Weekly Sakai, Isami, DO   0.1 mg at 03/05/18 1330  . sennosides (SENOKOT) 8.8 MG/5ML syrup 5 mL  5 mL Per Tube Daily Sudini, Srikar, MD   5 mL at 03/04/18 0924   And  . docusate (COLACE) 50 MG/5ML liquid 100 mg  100 mg Per Tube Daily Hillary Bow, MD   100 mg at 03/04/18 0924  . enoxaparin (LOVENOX) injection 30 mg  30 mg Subcutaneous Q24H Hillary Bow, MD   30 mg at 03/04/18 2121  . feeding supplement (OSMOLITE 1.5 CAL) liquid 1,000 mL  1,000 mL Per Tube Q24H Sudini, Alveta Heimlich, MD 40 mL/hr at 03/05/18 1328 1,000  mL at 03/05/18 1328  . fentaNYL (DURAGESIC - dosed mcg/hr) 12.5 mcg  12.5 mcg Transdermal Q72H Sakai, Isami, DO   12.5 mcg at 03/02/18 1728  . fluticasone (FLONASE) 50 MCG/ACT nasal spray 1 spray  1 spray Each Nare Daily Sakai, Isami, DO   1 spray at 03/05/18 1004  . folic acid (FOLVITE) tablet 1 mg  1 mg Oral Daily Sakai, Isami, DO   1 mg at 03/05/18 1006  . free water 50 mL  50 mL Per Tube Q4H Hillary Bow, MD   50 mL at 03/05/18 1531  . ipratropium-albuterol (DUONEB) 0.5-2.5 (3) MG/3ML nebulizer solution 3 mL  3 mL Nebulization Q6H PRN Sakai, Isami, DO      . labetalol (NORMODYNE,TRANDATE) injection 10 mg  10 mg Intravenous Q2H PRN Sakai, Isami, DO   10 mg at 02/23/18 0547  . levothyroxine (SYNTHROID, LEVOTHROID) tablet 50 mcg  50 mcg Per Tube QAC breakfast Hillary Bow, MD   50 mcg at 03/05/18 0539  . metoprolol tartrate (LOPRESSOR) tablet 100 mg  100 mg Per Tube BID Hillary Bow, MD   100 mg at 03/05/18 1005  . mometasone-formoterol (DULERA) 100-5 MCG/ACT inhaler 2 puff  2 puff Inhalation BID Sakai, Isami, DO   2 puff at 03/05/18 1004  . morphine 2 MG/ML injection 2 mg  2 mg Intravenous Q2H PRN Hillary Bow, MD   2 mg at 03/02/18 0252  . multivitamin with minerals tablet 1 tablet  1 tablet Oral Daily Sakai, Isami, DO   1 tablet at 03/05/18 1005  . mupirocin ointment (BACTROBAN) 2 % 1 application  1 application Nasal BID Conforti, John, DO   1 application at 34/74/25 1004  . nicotine (NICODERM CQ - dosed in mg/24 hours) patch 14 mg  14 mg Transdermal Daily Sakai, Isami, DO   14 mg at 03/05/18 1006  . ondansetron (ZOFRAN) tablet 4 mg  4 mg Oral Q6H PRN Lysle Pearl, Isami, DO       Or  . ondansetron (ZOFRAN) injection 4 mg  4 mg Intravenous Q6H PRN Sakai, Isami, DO   4 mg at 03/02/18 1741  . oxyCODONE (Oxy IR/ROXICODONE) immediate release tablet 5 mg  5 mg Per Tube Q4H PRN Hillary Bow, MD   5 mg at 03/04/18 2129  . polyethylene glycol (MIRALAX / GLYCOLAX) packet 17 g  17 g Oral Daily PRN  Sakai, Isami, DO      . thiamine (VITAMIN B-1) tablet 100 mg  100 mg Oral Daily Sakai, Isami, DO   100 mg at 03/05/18 1008   Or  . thiamine (B-1) injection 100 mg  100 mg Intravenous Daily Sakai, Isami, DO   100 mg at 02/27/18 1324  . traZODone (DESYREL) tablet 50 mg  50 mg Oral QHS PRN Lysle Pearl, Isami, DO   50 mg at 02/28/18 2206  . venlafaxine [facility]) tablet 37.5 mg  37.5 mg Per Tube BID WC Hillary Bow, MD   37.5 mg at 03/05/18 1005     Discharge Medications: Please see discharge summary for a list of discharge medications.  Relevant Imaging Results:  Relevant Lab Results:   Additional Information SSN [id number]  Ross Ludwig, [place]

## 2018-03-05 NOTE — Progress Notes (Signed)
Final in and out cath volume 1250.  Repeat bladder scan shows 12 ml of urine in bladder.  Patient says he feels better.

## 2018-03-06 ENCOUNTER — Inpatient Hospital Stay: Payer: Medicare Other

## 2018-03-06 LAB — BASIC METABOLIC PANEL
Anion gap: 7 (ref 5–15)
BUN: 14 mg/dL (ref 8–23)
CALCIUM: 7.1 mg/dL — AB (ref 8.9–10.3)
CHLORIDE: 90 mmol/L — AB (ref 98–111)
CO2: 32 mmol/L (ref 22–32)
CREATININE: 0.99 mg/dL (ref 0.61–1.24)
GFR calc non Af Amer: >60 mL/min (ref >60)
GLUCOSE: 117 mg/dL — AB (ref 70–99)
Potassium: 3.2 mmol/L — ABNORMAL LOW (ref 3.5–5.1)
Sodium: 129 mmol/L — ABNORMAL LOW (ref 135–145)

## 2018-03-06 LAB — PHOSPHORUS: Phosphorus: 2.4 mg/dL — ABNORMAL LOW (ref 2.5–4.6)

## 2018-03-06 LAB — GLUCOSE, CAPILLARY
GLUCOSE-CAPILLARY: 106 mg/dL — AB (ref 70–99)
GLUCOSE-CAPILLARY: 125 mg/dL — AB (ref 70–99)
Glucose-Capillary: 132 mg/dL — ABNORMAL HIGH (ref 70–99)
Glucose-Capillary: 118 mg/dL — ABNORMAL HIGH (ref 70–99)
Glucose-Capillary: 111 mg/dL — ABNORMAL HIGH (ref 70–99)

## 2018-03-06 MED ORDER — DOCUSATE SODIUM 50 MG/5ML PO LIQD
100.0000 mg | Freq: Every day | ORAL | Status: DC | PRN
  Filled 2018-03-06: qty 10

## 2018-03-06 MED ORDER — OSMOLITE 1.5 CAL PO LIQD
1000.0000 mL | ORAL | Status: DC
  Administered 2018-03-06 – 2018-03-07 (×3): 1000 mL

## 2018-03-06 MED ORDER — POTASSIUM CHLORIDE 20 MEQ PO PACK
40.0000 meq | PACK | Freq: Once | ORAL | Status: AC
  Administered 2018-03-06: 40 meq via ORAL
  Filled 2018-03-06: qty 2

## 2018-03-06 MED ORDER — POTASSIUM & SODIUM PHOSPHATES 280-160-250 MG PO PACK
1.0000 | PACK | Freq: Three times a day (TID) | ORAL | Status: AC
  Administered 2018-03-06 (×3): 1 via ORAL
  Filled 2018-03-06 (×3): qty 1

## 2018-03-06 MED ORDER — SENNOSIDES 8.8 MG/5ML PO SYRP
5.0000 mL | ORAL_SOLUTION | Freq: Every day | ORAL | Status: DC | PRN
  Administered 2018-03-08: 5 mL
  Filled 2018-03-06 (×2): qty 5

## 2018-03-06 NOTE — Care Management (Signed)
Barrier: Worsening pneumonia on chest xray.  Unable to advanced tube feeding rate due to abd pain and diarrhea.

## 2018-03-06 NOTE — Clinical Social Work Note (Signed)
Patient still plans to go to [facility] for short term rehab, CSW continuing to follow patient's progress throughout discharge planning.  Jones Broom. [place], MSW, [place]

## 2018-03-06 NOTE — Progress Notes (Signed)
[place] [facility] [place] NAME: Thomas [name]    MR#:  [id number]  DATE OF BIRTH:  1951/05/29  SUBJECTIVE:  CHIEF COMPLAINT:   Chief Complaint  Patient presents with  . Neck Pain   -Very frail appearing.  Still slightly dyspneic and coarse breath sounds -Complains of weakness and cough  REVIEW OF SYSTEMS:  Review of Systems  Constitutional: Positive for malaise/fatigue. Negative for chills and fever.  HENT: Negative for ear discharge, hearing loss and nosebleeds.   Eyes: Negative for blurred vision and double vision.  Respiratory: Positive for cough and shortness of breath. Negative for wheezing.   Cardiovascular: Negative for chest pain, palpitations and leg swelling.  Gastrointestinal: Negative for abdominal pain, constipation, diarrhea, nausea and vomiting.  Genitourinary: Negative for dysuria.       Urinary retention  Musculoskeletal: Negative for myalgias.  Neurological: Negative for dizziness, focal weakness, seizures, weakness and headaches.  Psychiatric/Behavioral: Negative for depression.    DRUG ALLERGIES:  No Known Allergies  VITALS:  Blood pressure (!) 110/56, pulse 70, temperature 98.5 F (36.9 C), temperature source Oral, resp. rate 18, height 5\' 7"  (1.702 m), weight 49.9 kg (110 lb), SpO2 96 %.  PHYSICAL EXAMINATION:  Physical Exam   GENERAL:  67 y.o.-year-old patient lying in the bed, slightly dyspneic EYES: Pupils equal, round, reactive to light and accommodation. No scleral icterus. Extraocular muscles intact.  HEENT: Head atraumatic, normocephalic. Oropharynx and nasopharynx clear.  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  Left neck skin hardening/fixed mass present.  Also some hyperpigmentation changes noted from previous radiation treatment. No thyroid enlargement, no tenderness.  LUNGS: very coarse breath sounds bilaterally, has rhonchi. no  wheezing, rales  or crepitation. No use of accessory muscles of respiration.  CARDIOVASCULAR: S1, S2 normal. No murmurs, rubs, or gallops.  ABDOMEN: Soft, nontender, nondistended. Bowel sounds present. No organomegaly or mass.  EXTREMITIES: No pedal edema, cyanosis, or clubbing.  NEUROLOGIC: Cranial nerves II through XII are intact. Muscle strength 5/5 in all extremities. Sensation intact. Gait not checked. Global weakness noted. PSYCHIATRIC: The patient is alert and oriented x 3.  SKIN: No obvious rash, lesion, or ulcer.    LABORATORY PANEL:   CBC Recent Labs  Lab 03/02/18 0430  WBC 17.0*  HGB 8.8*  HCT 26.9*  PLT 236   ------------------------------------------------------------------------------------------------------------------  Chemistries  Recent Labs  Lab 03/03/18 0525  03/05/18 0455 03/06/18 0335  NA 136   < > 128* 129*  K 3.3*   < > 3.5 3.2*  CL 99   < > 91* 90*  CO2 33*   < > 32 32  GLUCOSE 115*   < > 130* 117*  BUN 12   < > 11 14  CREATININE 0.83   < > 0.57* 0.99  CALCIUM 7.7*   < > 7.6* 7.1*  MG 1.7   < > 1.9  --   AST 18  --   --   --   ALT 14  --   --   --   ALKPHOS 44  --   --   --   BILITOT 0.5  --   --   --    < > = values in this interval not displayed.   ------------------------------------------------------------------------------------------------------------------  Cardiac Enzymes No results for input(s): TROPONINI in the last 168 hours. ------------------------------------------------------------------------------------------------------------------  RADIOLOGY:  Dg Chest 2 View  Result Date: 03/06/2018 CLINICAL DATA:  Dyspnea, hyponatremia, cervical lymphadenopathy. History of nasopharygeal and tongue malignancy. History of heavy smoking. EXAM: CHEST - 2 VIEW COMPARISON:  Portable chest x-ray of March 01, 2018 FINDINGS: There has developed an interstitial infiltrate in the right mid lung. Coarse lung markings at both bases persist. The heart  and pulmonary vascularity are normal. There is calcification in the wall of the aortic arch. There is a posterior layering right pleural effusion. IMPRESSION: Interval worsening of pneumonia greatest in the right lung. Persistent bibasilar infiltrates and small posterior layering right pleural effusion. No pulmonary edema. Thoracic aortic atherosclerosis. Electronically Signed   By: David  Martinique M.D.   On: 03/06/2018 11:15    EKG:   Orders placed or performed during the hospital encounter of 02/09/16  . ED EKG  . ED EKG  . ED EKG  . ED EKG  . EKG 12-Lead  . EKG 12-Lead    ASSESSMENT AND PLAN:   67 year old male with past medical history significant for tongue cancer status post chemotherapy in the past was brought in from home secondary to hypoxia and failure to thrive.  1.  Acute respiratory failure-secondary to aspiration pneumonia -Initially patient was admitted to the floor, had an episode with respiratory distress and transferred to ICU.  Patient was on BiPAP, never intubated. -Transition to floor, on 2 L oxygen now. -Was on Unasyn, currently on Augmentin through PEG tube -Repeat x-ray still showing worsening right-sided infiltrates.  Monitor  2.  Recurrent aspiration pneumonia-dysphagia.  Appreciate speech consult. -Currently has a PEG tube and on tube feeds.  3.  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 -Neck mass biopsy this time reveals squamous cell carcinoma again. -Needs outpatient PET scan and also outpatient follow-up for likely chemotherapy/immunotherapy depending upon his performance status at the time  4.  Hyponatremia-known history of chronic hyponatremia-likely also driven from underlying cancer. SIADH - sodium at 129 now  5.  Hypertension-on clonidine patch, metoprolol and Norvasc  6.  Flexes-on Lovenox  Physical  therapy recommended rehab   Updated daughter over the phone.   All the records are reviewed and case discussed with Care Management/Social Workerr. Management plans discussed with the patient, family and they are in agreement.  CODE STATUS: Full Code  TOTAL TIME TAKING CARE OF THIS PATIENT: 38 minutes.   POSSIBLE D/C IN 2 DAYS, DEPENDING ON CLINICAL CONDITION.   [name] M.D on 03/06/2018 at 3:05 PM  Between 7am to 6pm - Pager - [phone]  After 6pm go to [email or web address] - password [email or web address] [place] [facility]  Office  [phone]  CC: Primary care physician; [facility]

## 2018-03-06 NOTE — Clinical Social Work Note (Signed)
CSW spoke to patient's daughter Tharon Aquas, and confirmed she still would like patient to go to [facility].  Patient's daughter said yes she still would like the [facility] [place] [facility].  Patient is not medically ready for discharge yet per physician, CSW continuing to follow patient's progress throughout discharge planning.  Jones Broom. Norval Morton, MSW, [place]  03/06/2018 12:37 PM

## 2018-03-06 NOTE — Progress Notes (Signed)
Pharmacy Electrolyte Monitoring Consult:  Pharmacy consulted to assist in monitoring and replacing electrolytes in this 67 y.o. male with recurrent head and neck cancer with AF converted with amiodarone.   Labs:  Sodium (mmol/L)  Date Value  03/06/2018 129 (L)  02/22/2013 137   Potassium (mmol/L)  Date Value  03/06/2018 3.2 (L)  02/22/2013 4.6   Magnesium (mg/dL)  Date Value  03/05/2018 1.9  02/22/2013 2.1   Phosphorus (mg/dL)  Date Value  03/06/2018 2.4 (L)   Calcium (mg/dL)  Date Value  03/06/2018 7.1 (L)   Calcium, Total (mg/dL)  Date Value  02/22/2013 8.9   Albumin (g/dL)  Date Value  03/03/2018 2.0 (L)  02/19/2013 3.6    Assessment/Plan: 7/30 Phos: 2.4, K: 3.2, Na: 129. Will replace with KCL 64mEq x 1 dose and PHOS-NAK  3x 3 doses.  Recheck electrolytes with AM labs and continue to replace as needed.   Pernell Dupre, PharmD, BCPS Clinical Pharmacist 03/06/2018 7:01 AM

## 2018-03-07 LAB — PHOSPHORUS: PHOSPHORUS: 2.2 mg/dL — AB (ref 2.5–4.6)

## 2018-03-07 LAB — CBC
HCT: 23.7 % — ABNORMAL LOW (ref 40.0–52.0)
Hemoglobin: 8.1 g/dL — ABNORMAL LOW (ref 13.0–18.0)
MCH: 29.4 pg (ref 26.0–34.0)
MCHC: 34.2 g/dL (ref 32.0–36.0)
MCV: 85.9 fL (ref 80.0–100.0)
Platelets: 317 10*3/uL (ref 150–440)
RBC: 2.76 MIL/uL — AB (ref 4.40–5.90)
RDW: 16 % — ABNORMAL HIGH (ref 11.5–14.5)
WBC: 8.6 10*3/uL (ref 3.8–10.6)

## 2018-03-07 LAB — GLUCOSE, CAPILLARY
GLUCOSE-CAPILLARY: 112 mg/dL — AB (ref 70–99)
GLUCOSE-CAPILLARY: 114 mg/dL — AB (ref 70–99)
GLUCOSE-CAPILLARY: 132 mg/dL — AB (ref 70–99)
Glucose-Capillary: 118 mg/dL — ABNORMAL HIGH (ref 70–99)
Glucose-Capillary: 125 mg/dL — ABNORMAL HIGH (ref 70–99)
Glucose-Capillary: 123 mg/dL — ABNORMAL HIGH (ref 70–99)

## 2018-03-07 LAB — BASIC METABOLIC PANEL
ANION GAP: 3 — AB (ref 5–15)
BUN: 15 mg/dL (ref 8–23)
CHLORIDE: 93 mmol/L — AB (ref 98–111)
CO2: 34 mmol/L — AB (ref 22–32)
Calcium: 7.3 mg/dL — ABNORMAL LOW (ref 8.9–10.3)
Creatinine, Ser: 0.77 mg/dL (ref 0.61–1.24)
GFR calc Af Amer: >60 mL/min (ref >60)
GLUCOSE: 129 mg/dL — AB (ref 70–99)
POTASSIUM: 3.5 mmol/L (ref 3.5–5.1)
Sodium: 130 mmol/L — ABNORMAL LOW (ref 135–145)

## 2018-03-07 LAB — MAGNESIUM: Magnesium: 1.9 mg/dL (ref 1.7–2.4)

## 2018-03-07 MED ORDER — GUAIFENESIN ER 600 MG PO TB12: 600.0000 mg | ORAL_TABLET | Freq: Two times a day (BID) | ORAL | 2 refills | Status: AC

## 2018-03-07 MED ORDER — AMOXICILLIN-POT CLAVULANATE 400-57 MG/5ML PO SUSR
800.0000 mg | Freq: Two times a day (BID) | ORAL | 0 refills | Status: DC
Start: 2018-03-07 — End: 2018-03-12

## 2018-03-07 MED ORDER — METOPROLOL TARTRATE 100 MG PO TABS: 100.0000 mg | ORAL_TABLET | Freq: Two times a day (BID) | ORAL | 2 refills | Status: AC

## 2018-03-07 MED ORDER — OSMOLITE 1.5 CAL PO LIQD: 1000.0000 mL | ORAL | 3 refills | Status: DC

## 2018-03-07 MED ORDER — FREE WATER: 50.0000 mL | 2 refills | Status: DC

## 2018-03-07 MED ORDER — ENOXAPARIN SODIUM 40 MG/0.4ML ~~LOC~~ SOLN: 40.0000 mg | SUBCUTANEOUS | Status: DC

## 2018-03-07 MED ORDER — POTASSIUM & SODIUM PHOSPHATES 280-160-250 MG PO PACK
2.0000 | PACK | ORAL | Status: AC
  Administered 2018-03-07 (×4): 2 via ORAL
  Filled 2018-03-07 (×4): qty 2

## 2018-03-07 MED ORDER — ENOXAPARIN SODIUM 30 MG/0.3ML ~~LOC~~ SOLN
30.0000 mg | SUBCUTANEOUS | Status: DC
  Administered 2018-03-07: 30 mg via SUBCUTANEOUS
  Filled 2018-03-07: qty 0.3

## 2018-03-07 MED ORDER — RISAQUAD PO CAPS: 1.0000 | ORAL_CAPSULE | Freq: Every day | ORAL | 2 refills | Status: DC

## 2018-03-07 MED ORDER — DOCUSATE SODIUM 50 MG/5ML PO LIQD: 100.0000 mg | Freq: Every day | ORAL | 0 refills | Status: AC | PRN

## 2018-03-07 MED ORDER — SENNOSIDES 8.8 MG/5ML PO SYRP: 5.0000 mL | ORAL_SOLUTION | Freq: Every day | ORAL | 0 refills | Status: DC | PRN

## 2018-03-07 MED ORDER — RISAQUAD PO CAPS
1.0000 | ORAL_CAPSULE | Freq: Every day | ORAL | Status: DC
Start: 2018-03-07 — End: 2018-03-08
  Administered 2018-03-07 – 2018-03-08 (×2): 1 via ORAL
  Filled 2018-03-07 (×2): qty 1

## 2018-03-07 NOTE — Progress Notes (Signed)
Text-paged on-call MD to inform them that this patient's medications were late due to extenuating circumstances.  Dr. Duane Boston called back and stated that it was okay and not detrimental to the patient to receive his medications late.  On-coming RN informed of communication.

## 2018-03-07 NOTE — Discharge Summary (Signed)
[facility] NAME: Thomas [name]    MR#:  [id number]  DATE OF BIRTH:  Jul 30, 1951  DATE OF ADMISSION:  02/19/2018   ADMITTING PHYSICIAN: Avel Peace Salary, MD  DATE OF DISCHARGE:  03/07/18  PRIMARY CARE PHYSICIAN: [place], [facility]   ADMISSION DIAGNOSIS:   Hyponatremia [E87.1] Cervical lymphadenopathy [R59.0] CKD (chronic kidney disease) [N18.9]  DISCHARGE DIAGNOSIS:   Active Problems:   Hyponatremia   Head and neck cancer (HCC)   Cervical lymphadenopathy   Poor appetite   Loss of weight   History of tongue cancer   Neck mass   Dysphagia   SECONDARY DIAGNOSIS:   Past Medical History:  Diagnosis Date  . 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 Enlarged prostate   . Hypertension   . Lung infection    no lung cancer  . Radiation    to neck  . Renal disorder   . Skin cancer   . Status post chemotherapy   . Tongue cancer [facility])     HOSPITAL COURSE:   67 year old male with past medical history significant for tongue cancer status post chemotherapy in the past was brought in from home secondary to hypoxia and failure to thrive.  1.  Acute respiratory failure-secondary to aspiration pneumonia -Initially patient was admitted to the floor, had an episode of vomiting after PEG tube feeds were started, with respiratory distress and transferred to ICU.  Patient was on BiPAP, never intubated. -Stabilizing transfer to the floor.  Still has gurgling secretions more in the upper airway, can suction as needed. -Transition to floor, on 2-3 L oxygen now.  Encouraged to do incentive spirometry -Was on Unasyn, currently on Augmentin through PEG tube -Repeat x-ray still showing worsening right-sided infiltrates.  Monitor  2.  Recurrent aspiration pneumonia-dysphagia.  Appreciate speech consult. -Currently has a PEG tube and on tube feeds.  3.  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 -Neck mass biopsy this time reveals squamous cell carcinoma again. -Needs outpatient PET scan and also outpatient follow-up for likely chemotherapy/immunotherapy depending upon his performance status at the time  4.  Hyponatremia-known history of chronic hyponatremia-likely also driven from underlying cancer. SIADH - sodium at 130 now  5.  Hypertension-on clonidine patch, metoprolol and Norvasc   Physical therapy recommended rehab   Updated daughter at bedside    DISCHARGE CONDITIONS:   Guarded  CONSULTS OBTAINED:   Treatment Team:  Hillary Bow, MD  DRUG ALLERGIES:   No Known Allergies DISCHARGE MEDICATIONS:   Allergies as of 03/07/2018   No Known Allergies     Medication List    TAKE these medications   acidophilus Caps capsule Take 1 capsule by mouth daily. Start taking on:  03/08/2018   amLODipine 2.5 MG tablet Commonly known as:  NORVASC Place 1 tablet (2.5 mg total) into feeding tube daily.   amoxicillin-clavulanate 400-57 MG/5ML suspension Commonly known as:  AUGMENTIN Place 10 mLs (800 mg total) into feeding tube every 12 (twelve) hours for 5 days.   budesonide-formoterol 80-4.5 MCG/ACT inhaler Commonly known as:  SYMBICORT Inhale 2 puffs into the lungs 2 (two) times daily.   clonazePAM 1 MG tablet Commonly known as:  KLONOPIN Take 1 mg by mouth 3 (three) times daily as needed for anxiety.   cloNIDine 0.1 mg/24hr patch Commonly  known as:  CATAPRES - Dosed in mg/24 hr Place 1 patch (0.1 mg total) onto the skin once a week.   docusate 50 MG/5ML liquid Commonly known as:  COLACE Place 10 mLs (100 mg total) into feeding tube daily as needed for mild constipation.   DULoxetine 30 MG capsule Commonly known as:  CYMBALTA Take 30 mg by mouth daily.   feeding supplement (OSMOLITE 1.5 CAL) Liqd Place 1,000 mLs into feeding tube  daily. At 45cc/hr Start taking on:  03/08/2018   fluticasone 50 MCG/ACT nasal spray Commonly known as:  FLONASE Place 1 spray into both nostrils daily.   free water Soln Place 50 mLs into feeding tube every 4 (four) hours.   guaiFENesin 600 MG 12 hr tablet Commonly known as:  MUCINEX Take 1 tablet (600 mg total) by mouth 2 (two) times daily.   levothyroxine 50 MCG tablet Commonly known as:  SYNTHROID, LEVOTHROID Take 1 tablet (50 mcg total) by mouth daily before breakfast.   metoprolol tartrate 100 MG tablet Commonly known as:  LOPRESSOR Place 1 tablet (100 mg total) into feeding tube 2 (two) times daily.   sennosides 8.8 MG/5ML syrup Commonly known as:  SENOKOT Place 5 mLs into feeding tube daily as needed for mild constipation.   venlafaxine 37.5 MG tablet Commonly known as:  EFFEXOR Place 1 tablet (37.5 mg total) into feeding tube 2 (two) times daily with a meal.        DISCHARGE INSTRUCTIONS:   1. PCP f/u in 1 week 2. Oncology f/u in 1 week 3. Pulmonary f/u in 1-2 weeks 4. Dietitian follow up at the rehab for adjusting tube feeds  DIET:   Tube feeds   ACTIVITY:   Activity as tolerated  OXYGEN:   Home Oxygen: Yes.    Oxygen Delivery: 3 liters/min via Patient connected to nasal cannula oxygen  DISCHARGE LOCATION:   nursing home   If you experience worsening of your admission symptoms, develop shortness of breath, life threatening emergency, suicidal or homicidal thoughts you must seek medical attention immediately by calling 911 or calling your MD immediately  if symptoms less severe.  You Must read complete instructions/literature along with all the possible adverse reactions/side effects for all the Medicines you take and that have been prescribed to you. Take any new Medicines after you have completely understood and accpet all the possible adverse reactions/side effects.   Please note  You were cared for by a hospitalist during your hospital stay.  If you have any questions about your discharge medications or the care you received while you were in the hospital after you are discharged, you can call the unit and asked to speak with the hospitalist on call if the hospitalist that took care of you is not available. Once you are discharged, your primary care physician will handle any further medical issues. Please note that NO REFILLS for any discharge medications will be authorized once you are discharged, as it is imperative that you return to your primary care physician (or establish a relationship with a primary care physician if you do not have one) for your aftercare needs so that they can reassess your need for medications and monitor your lab values.    On the day of Discharge:  VITAL SIGNS:   Blood pressure (!) 117/51, pulse 84, temperature 98.5 F (36.9 C), resp. rate 18, height 5\' 7"  (1.702 m), weight 49.9 kg (110 lb), SpO2 93 %.  PHYSICAL EXAMINATION:   GENERAL:  67 y.o.-year-old  patient lying in the bed, slightly dyspneic EYES: Pupils equal, round, reactive to light and accommodation. No scleral icterus. Extraocular muscles intact.  HEENT: Head atraumatic, normocephalic. Oropharynx and nasopharynx clear.  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  Left neck skin hardening/fixed mass present.  Also some hyperpigmentation changes noted from previous radiation treatment. No thyroid enlargement, no tenderness.  LUNGS: very coarse breath sounds bilaterally, more in the upper airway, has rhonchi. no wheezing, rales  or crepitation. No use of accessory muscles of respiration.  CARDIOVASCULAR: S1, S2 normal. No murmurs, rubs, or gallops.  ABDOMEN: Soft, nontender, nondistended. Bowel sounds present. No organomegaly or mass.  EXTREMITIES: No pedal edema, cyanosis, or clubbing.  NEUROLOGIC: Cranial nerves II through XII are intact. Muscle strength 5/5 in all extremities.  Sensation intact. Gait not checked. Global weakness noted. PSYCHIATRIC: The patient is alert and oriented x 3.  SKIN: No obvious rash, lesion, or ulcer.     DATA REVIEW:   CBC Recent Labs  Lab 03/07/18 0523  WBC 8.6  HGB 8.1*  HCT 23.7*  PLT 317    Chemistries  Recent Labs  Lab 03/03/18 0525  03/07/18 0523  NA 136   < > 130*  K 3.3*   < > 3.5  CL 99   < > 93*  CO2 33*   < > 34*  GLUCOSE 115*   < > 129*  BUN 12   < > 15  CREATININE 0.83   < > 0.77  CALCIUM 7.7*   < > 7.3*  MG 1.7   < > 1.9  AST 18  --   --   ALT 14  --   --   ALKPHOS 44  --   --   BILITOT 0.5  --   --    < > = values in this interval not displayed.     Microbiology Results  Results for orders placed or performed during the hospital encounter of 02/19/18  Culture, blood (routine x 2) Call MD if unable to obtain prior to antibiotics being given     Status: None   Collection Time: 02/19/18  1:08 PM  Result Value Ref Range Status   Specimen Description BLOOD RIGHT ANTECUBITAL  Final   Special Requests   Final    BOTTLES DRAWN AEROBIC AND ANAEROBIC Blood Culture adequate volume   Culture   Final    NO GROWTH 5 DAYS Performed at [facility], [address] [place], [place] [address]    Report Status 02/24/2018 FINAL  Final  Culture, blood (routine x 2) Call MD if unable to obtain prior to antibiotics being given     Status: None   Collection Time: 02/19/18  1:19 PM  Result Value Ref Range Status   Specimen Description BLOOD LEFT ANTECUBITAL  Final   Special Requests   Final    BOTTLES DRAWN AEROBIC AND ANAEROBIC Blood Culture adequate volume   Culture   Final    NO GROWTH 5 DAYS Performed at [facility], [address], [place], [place] [address]    Report Status 02/24/2018 FINAL  Final  MRSA PCR Screening     Status: Abnormal   Collection Time: 03/01/18  3:41 PM  Result Value Ref Range Status   MRSA by PCR POSITIVE (A) NEGATIVE Final    Comment:        The  GeneXpert MRSA Assay (FDA approved for NASAL specimens only),  is one component of a comprehensive MRSA colonization surveillance program. It is not intended to diagnose MRSA infection nor to guide or monitor treatment for MRSA infections. RESULT CALLED TO, READ BACK BY AND VERIFIED WITH: CHERYL GREEN 03/01/18 AT 1730 BY HS Performed at [facility], [address], [place], [place] [address]     RADIOLOGY:  No results found.   Management plans discussed with the patient, family and they are in agreement.  CODE STATUS:     Code Status Orders  (From admission, onward)        Start     Ordered   02/19/18 1233  Full code  Continuous     02/19/18 1232    Code Status History    Date Active Date Inactive Code Status Order ID Comments User Context   02/19/2016 1355 02/22/2016 2000 Full Code [id number]  Lytle Butte, MD ED   02/07/2016 0230 02/09/2016 1859 Full Code [id number]  Harrie Foreman, MD Inpatient   01/22/2016 0552 01/26/2016 1805 Full Code [id number]  Harrie Foreman, MD Inpatient   08/17/2015 2229 08/21/2015 1800 Full Code [id number]  Lance Coon, MD Inpatient   12/12/2014 0119 12/18/2014 1659 Full Code [id number]  Hower, Aaron Mose, MD ED      TOTAL TIME TAKING CARE OF THIS PATIENT: 38  minutes.    [name] M.D on 03/07/2018 at 3:16 PM  Between 7am to 6pm - Pager - [phone]  After 6pm go to [email or web address] - Proofreader  [facility]  Office  [phone]  CC: Primary care physician; [facility]   Note: This dictation was prepared with Dragon dictation along with smaller phrase technology. Any transcriptional errors that result from this process are unintentional.

## 2018-03-07 NOTE — Clinical Social Work Note (Addendum)
CSW spoke with [facility], and they will accept patient tomorrow.  CSW updated physician and bedside nurse, that SNF is able to accept patient tomorrow.  CSW was informed that patient will need palliative to follow at SNF, CSW made referral to [facility] liaison.  CSW to facilitate discharge planning to [facility] tomorrow with palliative to follow.  Jones Broom. [place], MSW, [place]  03/07/2018 3:53 PM

## 2018-03-07 NOTE — Progress Notes (Signed)
Pharmacy Electrolyte Monitoring Consult:  Pharmacy consulted to assist in monitoring and replacing electrolytes in this 67 y.o. male with recurrent head and neck cancer with AF converted with amiodarone.   Labs:  Sodium (mmol/L)  Date Value  03/07/2018 130 (L)  02/22/2013 137   Potassium (mmol/L)  Date Value  03/07/2018 3.5  02/22/2013 4.6   Magnesium (mg/dL)  Date Value  03/07/2018 1.9  02/22/2013 2.1   Phosphorus (mg/dL)  Date Value  03/07/2018 2.2 (L)   Calcium (mg/dL)  Date Value  03/07/2018 7.3 (L)   Calcium, Total (mg/dL)  Date Value  02/22/2013 8.9   Albumin (g/dL)  Date Value  03/03/2018 2.0 (L)  02/19/2013 3.6    Assessment/Plan: 8/1 Phos: 2.2, K: 3.5, Mag 1.9,Na: 130. Scr 0.77 Will replace with PHOS-NAK 2 packets q4h x 4 doses Recheck electrolytes with AM labs and continue to replace as needed.   Noralee Space, PharmD, BCPS Clinical Pharmacist 03/07/2018 8:49 AM

## 2018-03-07 NOTE — Progress Notes (Signed)
Patient will be discharged to [facility].  Daughter updated.  Patient updated.

## 2018-03-07 NOTE — Progress Notes (Signed)
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 P.CT showed necrotic lymphadenopathy, metastatic. PEG placement 7/16, transferred to ICU 7/26 for respiratory distress/aspiration.     PT Comments    Pt in bed, lightly sleeping.  Awoke and agreed to exercises as below but declined out of bed activities.  Will continue as appropriate.  Follow Up Recommendations  SNF     Equipment Recommendations       Recommendations for Other Services       Precautions / Restrictions Precautions Precautions: Fall Restrictions Weight Bearing Restrictions: No Other Position/Activity Restrictions: PEG     Mobility  Bed Mobility               General bed mobility comments: refused "I've been up and down all day"    Transfers                    Ambulation/Gait                 Stairs             Wheelchair Mobility    Modified Rankin (Stroke Patients Only)       Balance                                            Cognition Arousal/Alertness: Lethargic Behavior During Therapy: WFL for tasks assessed/performed Overall Cognitive Status: No family/caregiver present to determine baseline cognitive functioning                                        Exercises Other Exercises Other Exercises: BLE A/AAROM for ankle pumps, heel slided, ab/add, SLR 2 x 10     General Comments        Pertinent Vitals/Pain Pain Assessment: No/denies pain    Home Living                      Prior  Function            PT Goals (current goals can now be found in the care plan section) Progress towards PT goals: Progressing toward goals    Frequency    Min 2X/week      PT Plan Current plan remains appropriate    Co-evaluation              AM-PAC PT "6 Clicks" Daily Activity  Outcome Measure  Difficulty turning over in bed (including adjusting bedclothes, sheets and blankets)?: A Little Difficulty moving from lying on back to sitting on the side of the bed? : A Little Difficulty sitting down on and standing up from a chair with arms (e.g., wheelchair, bedside commode, etc,.)?: Unable Help needed moving to and from a bed to chair (including a wheelchair)?: A Little Help needed walking in hospital room?: A Little Help needed climbing 3-5 steps with a railing? :  A Lot 6 Click Score: 15    End of Session Equipment Utilized During Treatment: Oxygen Activity Tolerance: Patient tolerated treatment well;Patient limited by lethargy;Patient limited by fatigue Patient left: in bed;with bed alarm set;with call bell/phone within reach         Time: 1400-1410 PT Time Calculation (min) (ACUTE ONLY): 10 min  Charges:  $Therapeutic Exercise: 8-22 mins                     Chesley Noon, PTA 03/07/18, 2:15 PM

## 2018-03-07 NOTE — Progress Notes (Signed)
New referral for outpatient Palliative to follow at [facility] received from [employer]. Plan is for discharge tomorrow. Patient information faxed to referral. Flo Shanks RN, BSN, [facility] [place], hospital liaison [phone]

## 2018-03-08 LAB — BASIC METABOLIC PANEL
ANION GAP: 6 (ref 5–15)
BUN: 14 mg/dL (ref 8–23)
CALCIUM: 7.6 mg/dL — AB (ref 8.9–10.3)
CO2: 34 mmol/L — ABNORMAL HIGH (ref 22–32)
Chloride: 93 mmol/L — ABNORMAL LOW (ref 98–111)
Creatinine, Ser: 0.71 mg/dL (ref 0.61–1.24)
GFR calc Af Amer: >60 mL/min (ref >60)
GLUCOSE: 105 mg/dL — AB (ref 70–99)
Potassium: 4 mmol/L (ref 3.5–5.1)
Sodium: 133 mmol/L — ABNORMAL LOW (ref 135–145)

## 2018-03-08 LAB — GLUCOSE, CAPILLARY
GLUCOSE-CAPILLARY: 121 mg/dL — AB (ref 70–99)
GLUCOSE-CAPILLARY: 124 mg/dL — AB (ref 70–99)
GLUCOSE-CAPILLARY: 131 mg/dL — AB (ref 70–99)

## 2018-03-08 LAB — PHOSPHORUS: Phosphorus: 2.7 mg/dL (ref 2.5–4.6)

## 2018-03-08 LAB — MAGNESIUM: Magnesium: 1.9 mg/dL (ref 1.7–2.4)

## 2018-03-08 MED ORDER — CLONAZEPAM 1 MG PO TABS: 1.0000 mg | ORAL_TABLET | Freq: Three times a day (TID) | ORAL | 0 refills | Status: DC | PRN

## 2018-03-08 MED ORDER — OSMOLITE 1.5 CAL PO LIQD: 1000.0000 mL | ORAL | 3 refills | Status: AC

## 2018-03-08 MED ORDER — TAMSULOSIN HCL 0.4 MG PO CAPS: 0.4000 mg | ORAL_CAPSULE | Freq: Every day | ORAL | 1 refills | Status: DC

## 2018-03-08 MED ORDER — FENTANYL 12 MCG/HR TD PT72: 12.5000 ug | MEDICATED_PATCH | TRANSDERMAL | 0 refills | Status: DC

## 2018-03-08 NOTE — Progress Notes (Signed)
Patient is medically stable for D/C to [facility] today. Per Neoma Laming admissions coordinator at [facility] patient can come today to room 319. RN will call report to [facility] and arrange EMS for transport. Clinical Education officer, museum (CSW) sent D/C orders to [facility] via [facility]. Palliative care will follow. [facility] liaison is aware of above. CSW contacted patient's daughter Tharon Aquas and made her aware of above. Per daughter she is going to [facility] today around lunch time to complete admissions paper work. Please reconsult if future social work needs arise. CSW signing off.   [employer], LCSW [phone]

## 2018-03-08 NOTE — Progress Notes (Signed)
Thomas [name] to be D/C'd Skilled nursing facility ([facility] [place]  per MD order. Report given to [employer], LPN.   Allergies as of 03/08/2018   No Known Allergies     Medication List    TAKE these medications   acidophilus Caps capsule Take 1 capsule by mouth daily.   amLODipine 2.5 MG tablet Commonly known as:  NORVASC Place 1 tablet (2.5 mg total) into feeding tube daily.   amoxicillin-clavulanate 400-57 MG/5ML suspension Commonly known as:  AUGMENTIN Place 10 mLs (800 mg total) into feeding tube every 12 (twelve) hours for 5 days.   budesonide-formoterol 80-4.5 MCG/ACT inhaler Commonly known as:  SYMBICORT Inhale 2 puffs into the lungs 2 (two) times daily.   clonazePAM 1 MG tablet Commonly known as:  KLONOPIN Take 1 tablet (1 mg total) by mouth 3 (three) times daily as needed for anxiety.   cloNIDine 0.1 mg/24hr patch Commonly known as:  CATAPRES - Dosed in mg/24 hr Place 1 patch (0.1 mg total) onto the skin once a week.   docusate 50 MG/5ML liquid Commonly known as:  COLACE Place 10 mLs (100 mg total) into feeding tube daily as needed for mild constipation.   DULoxetine 30 MG capsule Commonly known as:  CYMBALTA Take 30 mg by mouth daily.   feeding supplement (OSMOLITE 1.5 CAL) Liqd Place 1,000 mLs into feeding tube daily. At 50 cc/hr   fentaNYL 12 MCG/HR Commonly known as:  DURAGESIC - dosed mcg/hr Place 1 patch (12.5 mcg total) onto the skin every 3 (three) days.   fluticasone 50 MCG/ACT nasal spray Commonly known as:  FLONASE Place 1 spray into both nostrils daily.   free water Soln Place 50 mLs into feeding tube every 4 (four) hours.   guaiFENesin 600 MG 12 hr tablet Commonly known as:  MUCINEX Take 1 tablet (600 mg total) by mouth 2 (two) times daily.   levothyroxine 50 MCG tablet Commonly known as:  SYNTHROID, LEVOTHROID Take 1 tablet (50 mcg total) by mouth daily before breakfast.   metoprolol tartrate 100 MG tablet Commonly known as:   LOPRESSOR Place 1 tablet (100 mg total) into feeding tube 2 (two) times daily.   sennosides 8.8 MG/5ML syrup Commonly known as:  SENOKOT Place 5 mLs into feeding tube daily as needed for mild constipation.   tamsulosin 0.4 MG Caps capsule Commonly known as:  FLOMAX Take 1 capsule (0.4 mg total) by mouth daily.   venlafaxine 37.5 MG tablet Commonly known as:  EFFEXOR Place 1 tablet (37.5 mg total) into feeding tube 2 (two) times daily with a meal.       Vitals:   03/08/18 0405 03/08/18 0742  BP: (!) 125/55 (!) 126/58  Pulse: 68 72  Resp: 18 20  Temp: 98 F (36.7 C) 97.6 F (36.4 C)  SpO2: 97% 93%    Skin clean, dry and intact without evidence of skin break down, no evidence of skin tears noted. IV catheter discontinued intact. Site without signs and symptoms of complications. Dressing and pressure applied. Pt denies pain at this time. No complaints noted.   Patient escorted via stretcher, and D/C via EMS.  Rolley Sims

## 2018-03-08 NOTE — Discharge Summary (Signed)
[facility] [facility] [place] NAME: Thomas [name]    MR#:  [id number]  DATE OF BIRTH:  [birth year]  DATE OF ADMISSION:  02/19/2018   ADMITTING PHYSICIAN: Avel Peace Salary, MD  DATE OF DISCHARGE:  03/08/18  PRIMARY CARE PHYSICIAN: [place], [facility]   ADMISSION DIAGNOSIS:   Hyponatremia [E87.1] Cervical lymphadenopathy [R59.0] CKD (chronic kidney disease) [N18.9]  DISCHARGE DIAGNOSIS:   Active Problems:   Hyponatremia   Head and neck cancer (HCC)   Cervical lymphadenopathy   Poor appetite   Loss of weight   History of tongue cancer   Neck mass   Dysphagia   SECONDARY DIAGNOSIS:   Past Medical History:  Diagnosis Date  . 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 Enlarged prostate   . Hypertension   . Lung infection    no lung cancer  . Radiation    to neck  . Renal disorder   . Skin cancer   . Status post chemotherapy   . Tongue cancer [facility])     HOSPITAL COURSE:   67 year old male with past medical history significant for tongue cancer status post chemotherapy in the past was brought in from home secondary to hypoxia and failure to thrive.  1.  Acute respiratory failure-secondary to aspiration pneumonia -Initially patient was admitted to the floor, had an episode of vomiting after PEG tube feeds were started, with respiratory distress and transferred to ICU.  Patient was on BiPAP, never intubated. -Stabilizing transfer to the floor.  Still has gurgling secretions more in the upper airway, can suction as needed. -Transition to floor, on 2-3 L oxygen now.  Encouraged to do incentive spirometry -Was on Unasyn, currently on Augmentin through PEG tube -Repeat x-ray still showing worsening right-sided infiltrates.  Monitor  2.  Recurrent aspiration pneumonia-dysphagia.  Appreciate speech consult. -Currently has a PEG tube and on tube feeds. - aspiration precautions- please always keep head of bed elevated and greater than 30  degrees  3.  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 -Neck mass biopsy this time reveals squamous cell carcinoma again. -Needs outpatient PET scan and also outpatient follow-up for likely chemotherapy/immunotherapy depending upon his performance status at the time  4.  Hyponatremia-known history of chronic hyponatremia-likely also driven from underlying cancer. SIADH - sodium at 130 now  5.  Hypertension-on clonidine patch, metoprolol and Norvasc  6. Recurrent urinary retention- started flomax- foley placed and will continue for 1 week prior to voiding trial   Physical therapy recommended rehab   Updated daughter prior to discharge yesterday    DISCHARGE CONDITIONS:   Guarded  CONSULTS OBTAINED:   Treatment Team:  Hillary Bow, MD  DRUG ALLERGIES:   No Known Allergies DISCHARGE MEDICATIONS:   Allergies as of 03/08/2018   No Known Allergies     Medication List    TAKE these medications   acidophilus Caps capsule Take 1 capsule by mouth daily.   amLODipine 2.5 MG tablet Commonly known as:  NORVASC Place 1 tablet (2.5 mg total) into feeding tube daily.   amoxicillin-clavulanate 400-57 MG/5ML suspension Commonly known as:  AUGMENTIN Place 10 mLs (800 mg total) into feeding tube every 12 (twelve) hours for 5 days.   budesonide-formoterol 80-4.5 MCG/ACT inhaler Commonly known as:  SYMBICORT Inhale 2 puffs into the lungs 2 (two) times daily.  clonazePAM 1 MG tablet Commonly known as:  KLONOPIN Take 1 mg by mouth 3 (three) times daily as needed for anxiety.   cloNIDine 0.1 mg/24hr patch Commonly known as:  CATAPRES - Dosed in mg/24 hr Place 1 patch (0.1 mg total) onto the skin once a week.   docusate 50 MG/5ML liquid Commonly known as:  COLACE Place 10 mLs (100 mg total) into feeding tube daily as needed  for mild constipation.   DULoxetine 30 MG capsule Commonly known as:  CYMBALTA Take 30 mg by mouth daily.   feeding supplement (OSMOLITE 1.5 CAL) Liqd Place 1,000 mLs into feeding tube daily. At 50 cc/hr   fluticasone 50 MCG/ACT nasal spray Commonly known as:  FLONASE Place 1 spray into both nostrils daily.   free water Soln Place 50 mLs into feeding tube every 4 (four) hours.   guaiFENesin 600 MG 12 hr tablet Commonly known as:  MUCINEX Take 1 tablet (600 mg total) by mouth 2 (two) times daily.   levothyroxine 50 MCG tablet Commonly known as:  SYNTHROID, LEVOTHROID Take 1 tablet (50 mcg total) by mouth daily before breakfast.   metoprolol tartrate 100 MG tablet Commonly known as:  LOPRESSOR Place 1 tablet (100 mg total) into feeding tube 2 (two) times daily.   sennosides 8.8 MG/5ML syrup Commonly known as:  SENOKOT Place 5 mLs into feeding tube daily as needed for mild constipation.   tamsulosin 0.4 MG Caps capsule Commonly known as:  FLOMAX Take 1 capsule (0.4 mg total) by mouth daily.   venlafaxine 37.5 MG tablet Commonly known as:  EFFEXOR Place 1 tablet (37.5 mg total) into feeding tube 2 (two) times daily with a meal.        DISCHARGE INSTRUCTIONS:   1. PCP f/u in 1 week 2. Oncology f/u in 1 week 3. Pulmonary f/u in 1-2 weeks 4. Dietitian follow up at the rehab for adjusting tube feeds 5. Voiding trial for foley removal in 1 week- f/u with urology if needed 6. Palliative care follow up at rehab 7. Aspiration precautions  DIET:   Tube feeds   ACTIVITY:   Activity as tolerated  OXYGEN:   Home Oxygen: Yes.    Oxygen Delivery: 3 liters/min via Patient connected to nasal cannula oxygen  DISCHARGE LOCATION:   nursing home   If you experience worsening of your admission symptoms, develop shortness of breath, life threatening emergency, suicidal or homicidal thoughts you must seek medical attention immediately by calling 911 or calling your MD  immediately  if symptoms less severe.  You Must read complete instructions/literature along with all the possible adverse reactions/side effects for all the Medicines you take and that have been prescribed to you. Take any new Medicines after you have completely understood and accpet all the possible adverse reactions/side effects.   Please note  You were cared for by a hospitalist during your hospital stay. If you have any questions about your discharge medications or the care you received while you were in the hospital after you are discharged, you can call the unit and asked to speak with the hospitalist on call if the hospitalist that took care of you is not available. Once you are discharged, your primary care physician will handle any further medical issues. Please note that NO REFILLS for any discharge medications will be authorized once you are discharged, as it is imperative that you return to your primary care physician (or establish a relationship with a primary care physician if you do  not have one) for your aftercare needs so that they can reassess your need for medications and monitor your lab values.    On the day of Discharge:  VITAL SIGNS:   Blood pressure (!) 126/58, pulse 72, temperature 97.6 F (36.4 C), temperature source Oral, resp. rate 20, height 5\' 7"  (1.702 m), weight 49.9 kg (110 lb), SpO2 93 %.  PHYSICAL EXAMINATION:   GENERAL:  67 y.o.-year-old patient lying in the bed, slightly dyspneic EYES: Pupils equal, round, reactive to light and accommodation. No scleral icterus. Extraocular muscles intact.  HEENT: Head atraumatic, normocephalic. Oropharynx and nasopharynx clear.  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  Left neck skin hardening/fixed mass present.  Also some hyperpigmentation changes noted from previous radiation treatment. No thyroid enlargement, no tenderness.  LUNGS: very coarse breath  sounds bilaterally, more in the upper airway, has rhonchi. no wheezing, rales  or crepitation. No use of accessory muscles of respiration.  CARDIOVASCULAR: S1, S2 normal. No murmurs, rubs, or gallops.  ABDOMEN: Soft, nontender, nondistended. Bowel sounds present. No organomegaly or mass.  EXTREMITIES: No pedal edema, cyanosis, or clubbing.  NEUROLOGIC: Cranial nerves II through XII are intact. Muscle strength 5/5 in all extremities. Sensation intact. Gait not checked. Global weakness noted. PSYCHIATRIC: The patient is alert and oriented x 3.  SKIN: No obvious rash, lesion, or ulcer.     DATA REVIEW:   CBC Recent Labs  Lab 03/07/18 0523  WBC 8.6  HGB 8.1*  HCT 23.7*  PLT 317    Chemistries  Recent Labs  Lab 03/03/18 0525  03/08/18 0450  NA 136   < > 133*  K 3.3*   < > 4.0  CL 99   < > 93*  CO2 33*   < > 34*  GLUCOSE 115*   < > 105*  BUN 12   < > 14  CREATININE 0.83   < > 0.71  CALCIUM 7.7*   < > 7.6*  MG 1.7   < > 1.9  AST 18  --   --   ALT 14  --   --   ALKPHOS 44  --   --   BILITOT 0.5  --   --    < > = values in this interval not displayed.     Microbiology Results  Results for orders placed or performed during the hospital encounter of 02/19/18  Culture, blood (routine x 2) Call MD if unable to obtain prior to antibiotics being given     Status: None   Collection Time: 02/19/18  1:08 PM  Result Value Ref Range Status   Specimen Description BLOOD RIGHT ANTECUBITAL  Final   Special Requests   Final    BOTTLES DRAWN AEROBIC AND ANAEROBIC Blood Culture adequate volume   Culture   Final    NO GROWTH 5 DAYS Performed at [facility], [address] [place], [place] [address]    Report Status 02/24/2018 FINAL  Final  Culture, blood (routine x 2) Call MD if unable to obtain prior to antibiotics being given     Status: None   Collection Time: 02/19/18  1:19 PM  Result Value Ref Range Status   Specimen Description BLOOD LEFT ANTECUBITAL  Final    Special Requests   Final    BOTTLES DRAWN AEROBIC AND ANAEROBIC Blood Culture adequate volume   Culture   Final    NO GROWTH 5 DAYS Performed at  [facility], [address] [place], [place] [address]    Report Status 02/24/2018 FINAL  Final  MRSA PCR Screening     Status: Abnormal   Collection Time: 03/01/18  3:41 PM  Result Value Ref Range Status   MRSA by PCR POSITIVE (A) NEGATIVE Final    Comment:        The GeneXpert MRSA Assay (FDA approved for NASAL specimens only), is one component of a comprehensive MRSA colonization surveillance program. It is not intended to diagnose MRSA infection nor to guide or monitor treatment for MRSA infections. RESULT CALLED TO, READ BACK BY AND VERIFIED WITH: CHERYL GREEN 03/01/18 AT 1730 BY HS Performed at [facility], [address], [place],  [address]     RADIOLOGY:  No results found.   Management plans discussed with the patient, family and they are in agreement.  CODE STATUS:     Code Status Orders  (From admission, onward)        Start     Ordered   02/19/18 1233  Full code  Continuous     02/19/18 1232    Code Status History    Date Active Date Inactive Code Status Order ID Comments User Context   02/19/2016 1355 02/22/2016 2000 Full Code [id number]  Lytle Butte, MD ED   02/07/2016 0230 02/09/2016 1859 Full Code [id number]  Harrie Foreman, MD Inpatient   01/22/2016 0552 01/26/2016 1805 Full Code [id number]  Harrie Foreman, MD Inpatient   08/17/2015 2229 08/21/2015 1800 Full Code [id number]  Lance Coon, MD Inpatient   12/12/2014 0119 12/18/2014 1659 Full Code [id number]  Hower, Aaron Mose, MD ED      TOTAL TIME TAKING CARE OF THIS PATIENT: 38  minutes.    [name] M.D on 03/08/2018 at 8:29 AM  Between 7am to 6pm - Pager - [phone]  After 6pm go to [email or web address] - Proofreader  [facility]  Office  [phone]  CC: Primary care physician;  [facility]   Note: This dictation was prepared with Dragon dictation along with smaller phrase technology. Any transcriptional errors that result from this process are unintentional.

## 2018-03-08 NOTE — Progress Notes (Signed)
Report given to Peri Maris, LPN at [facility]. [facility] called for transportation.

## 2018-03-08 NOTE — Clinical Social Work Placement (Signed)
   CLINICAL SOCIAL WORK PLACEMENT  NOTE  Date:  03/08/2018  Patient Details  Name: Thomas [name] MRN: [id number] Date of Birth: 08/04/1951  Clinical Social Work is seeking post-discharge placement for this patient at the [place] level of care (*CSW will initial, date and re-position this form in  chart as items are completed):  Yes   Patient/family provided with [facility] Work Department's list of facilities offering this level of care within the geographic area requested by the patient (or if unable, by the patient's family).  Yes   Patient/family informed of their freedom to choose among providers that offer the needed level of care, that participate in Medicare, Medicaid or managed care program needed by the patient, have an available bed and are willing to accept the patient.  Yes   Patient/family informed of [facility]'s ownership interest in [facility] and [facility], as well as of the fact that they are under no obligation to receive care at these facilities.  PASRR submitted to EDS on       PASRR number received on       Existing PASRR number confirmed on 02/22/18     FL2 transmitted to all facilities in geographic area requested by pt/family on 02/22/18     FL2 transmitted to all facilities within larger geographic area on       Patient informed that his/her managed care company has contracts with or will negotiate with certain facilities, including the following:        Yes   Patient/family informed of bed offers received.  Patient [facility] bed at ([facility] )     Physician recommends and patient chooses bed at      Patient to be [facility] to [facility] ) on 03/08/18.  Patient to be transferred to facility by [facility] )     Patient family notified on 03/08/18 of transfer.  Name of family member notified:  (Patient's daughter Tharon Aquas is aware of D/C today. )     PHYSICIAN       Additional  Comment:    _______________________________________________ Regenia Erck, Veronia Beets, LCSW 03/08/2018, 9:25 AM

## 2018-03-08 NOTE — Progress Notes (Signed)
Pharmacy Electrolyte Monitoring Consult:  Pharmacy consulted to assist in monitoring and replacing electrolytes in this 66 y.o. male with recurrent head and neck cancer with AF converted with amiodarone.   Labs:  Sodium (mmol/L)  Date Value  03/08/2018 133 (L)  02/22/2013 137   Potassium (mmol/L)  Date Value  03/08/2018 4.0  02/22/2013 4.6   Magnesium (mg/dL)  Date Value  03/08/2018 1.9  02/22/2013 2.1   Phosphorus (mg/dL)  Date Value  03/08/2018 2.7   Calcium (mg/dL)  Date Value  03/08/2018 7.6 (L)   Calcium, Total (mg/dL)  Date Value  02/22/2013 8.9   Albumin (g/dL)  Date Value  03/03/2018 2.0 (L)  02/19/2013 3.6    Assessment/Plan: 8/1 Phos: 2.2, K: 3.5, Mag 1.9,Na: 130. Scr 0.77 Will replace with PHOS-NAK 2 packets q4h x 4 doses Recheck electrolytes with AM labs and continue to replace as needed.  8/2 K 4.0 mag 1.9, Phos 2.7  Scr 0.71 No replacement needed at this time. F/u with am labs   Noralee Space, PharmD, BCPS Clinical Pharmacist 03/08/2018 8:44 AM

## 2018-03-10 ENCOUNTER — Emergency Department: Payer: Medicare Other

## 2018-03-10 ENCOUNTER — Inpatient Hospital Stay
Admission: EM | Admit: 2018-03-10 | Discharge: 2018-03-12 | DRG: 643 | Disposition: A | Discharge status: Skilled Nursing Facility | Payer: Medicare Other | Attending: Internal Medicine | Admitting: Internal Medicine

## 2018-03-10 ENCOUNTER — Inpatient Hospital Stay: Payer: Medicare Other

## 2018-03-10 ENCOUNTER — Other Ambulatory Visit
Admission: RE | Admit: 2018-03-10 | Discharge: 2018-03-10 | Disposition: A | Discharge status: Home / Self Care | Payer: Medicare Other | Source: Ambulatory Visit | Attending: Family Medicine | Admitting: Family Medicine

## 2018-03-10 ENCOUNTER — Encounter: Payer: Self-pay | Admitting: *Deleted

## 2018-03-10 ENCOUNTER — Other Ambulatory Visit: Payer: Self-pay

## 2018-03-10 DIAGNOSIS — Z85810 Personal history of malignant neoplasm of tongue: Secondary | ICD-10-CM | POA: Diagnosis not present

## 2018-03-10 DIAGNOSIS — Z79899 Other long term (current) drug therapy: Secondary | ICD-10-CM

## 2018-03-10 DIAGNOSIS — E860 Dehydration: Secondary | ICD-10-CM | POA: Diagnosis present

## 2018-03-10 DIAGNOSIS — R1310 Dysphagia, unspecified: Secondary | ICD-10-CM | POA: Diagnosis present

## 2018-03-10 DIAGNOSIS — Z792 Long term (current) use of antibiotics: Secondary | ICD-10-CM | POA: Diagnosis not present

## 2018-03-10 DIAGNOSIS — J449 Chronic obstructive pulmonary disease, unspecified: Secondary | ICD-10-CM | POA: Diagnosis present

## 2018-03-10 DIAGNOSIS — E222 Syndrome of inappropriate secretion of antidiuretic hormone: Secondary | ICD-10-CM | POA: Diagnosis present

## 2018-03-10 DIAGNOSIS — Z931 Gastrostomy status: Secondary | ICD-10-CM | POA: Diagnosis not present

## 2018-03-10 DIAGNOSIS — N401 Enlarged prostate with lower urinary tract symptoms: Secondary | ICD-10-CM | POA: Diagnosis present

## 2018-03-10 DIAGNOSIS — J690 Pneumonitis due to inhalation of food and vomit: Secondary | ICD-10-CM | POA: Diagnosis present

## 2018-03-10 DIAGNOSIS — G92 Toxic encephalopathy: Secondary | ICD-10-CM | POA: Diagnosis present

## 2018-03-10 DIAGNOSIS — Z8522 Personal history of malignant neoplasm of nasal cavities, middle ear, and accessory sinuses: Secondary | ICD-10-CM

## 2018-03-10 DIAGNOSIS — E871 Hypo-osmolality and hyponatremia: Secondary | ICD-10-CM | POA: Insufficient documentation

## 2018-03-10 DIAGNOSIS — R338 Other retention of urine: Secondary | ICD-10-CM | POA: Diagnosis present

## 2018-03-10 DIAGNOSIS — L8990 Pressure ulcer of unspecified site, unspecified stage: Secondary | ICD-10-CM | POA: Diagnosis not present

## 2018-03-10 DIAGNOSIS — R410 Disorientation, unspecified: Secondary | ICD-10-CM | POA: Diagnosis present

## 2018-03-10 DIAGNOSIS — F17210 Nicotine dependence, cigarettes, uncomplicated: Secondary | ICD-10-CM | POA: Diagnosis present

## 2018-03-10 DIAGNOSIS — J9601 Acute respiratory failure with hypoxia: Secondary | ICD-10-CM | POA: Insufficient documentation

## 2018-03-10 DIAGNOSIS — G9340 Encephalopathy, unspecified: Secondary | ICD-10-CM | POA: Diagnosis present

## 2018-03-10 DIAGNOSIS — Z8249 Family history of ischemic heart disease and other diseases of the circulatory system: Secondary | ICD-10-CM

## 2018-03-10 DIAGNOSIS — Z7189 Other specified counseling: Secondary | ICD-10-CM | POA: Diagnosis not present

## 2018-03-10 DIAGNOSIS — F1010 Alcohol abuse, uncomplicated: Secondary | ICD-10-CM | POA: Diagnosis present

## 2018-03-10 DIAGNOSIS — G40909 Epilepsy, unspecified, not intractable, without status epilepticus: Secondary | ICD-10-CM | POA: Diagnosis present

## 2018-03-10 DIAGNOSIS — E861 Hypovolemia: Secondary | ICD-10-CM | POA: Diagnosis present

## 2018-03-10 DIAGNOSIS — N189 Chronic kidney disease, unspecified: Secondary | ICD-10-CM | POA: Insufficient documentation

## 2018-03-10 DIAGNOSIS — E039 Hypothyroidism, unspecified: Secondary | ICD-10-CM | POA: Diagnosis present

## 2018-03-10 DIAGNOSIS — I10 Essential (primary) hypertension: Secondary | ICD-10-CM | POA: Diagnosis present

## 2018-03-10 DIAGNOSIS — Z515 Encounter for palliative care: Secondary | ICD-10-CM | POA: Diagnosis not present

## 2018-03-10 DIAGNOSIS — J9611 Chronic respiratory failure with hypoxia: Secondary | ICD-10-CM | POA: Diagnosis present

## 2018-03-10 DIAGNOSIS — Z85828 Personal history of other malignant neoplasm of skin: Secondary | ICD-10-CM

## 2018-03-10 DIAGNOSIS — Z79890 Hormone replacement therapy (postmenopausal): Secondary | ICD-10-CM

## 2018-03-10 LAB — CBC WITH DIFFERENTIAL/PLATELET
BASOS PCT: 0 %
Basophils Absolute: 0 10*3/uL (ref 0–0.1)
Eosinophils Absolute: 0.1 10*3/uL (ref 0–0.7)
Eosinophils Relative: 1 %
HEMATOCRIT: 32.3 % — AB (ref 40.0–52.0)
HEMOGLOBIN: 11.2 g/dL — AB (ref 13.0–18.0)
Lymphocytes Relative: 5 %
Lymphs Abs: 0.7 10*3/uL — ABNORMAL LOW (ref 1.0–3.6)
MCH: 29.5 pg (ref 26.0–34.0)
MCHC: 34.7 g/dL (ref 32.0–36.0)
MCV: 85 fL (ref 80.0–100.0)
MONOS PCT: 6 %
Monocytes Absolute: 0.8 10*3/uL (ref 0.2–1.0)
NEUTROS ABS: 11.9 10*3/uL — AB (ref 1.4–6.5)
NEUTROS PCT: 88 %
Platelets: 568 10*3/uL — ABNORMAL HIGH (ref 150–440)
RBC: 3.8 MIL/uL — ABNORMAL LOW (ref 4.40–5.90)
RDW: 16.1 % — ABNORMAL HIGH (ref 11.5–14.5)
WBC: 13.6 10*3/uL — ABNORMAL HIGH (ref 3.8–10.6)

## 2018-03-10 LAB — BASIC METABOLIC PANEL
ANION GAP: 8 (ref 5–15)
Anion gap: 8 (ref 5–15)
BUN: 14 mg/dL (ref 8–23)
BUN: 14 mg/dL (ref 8–23)
CALCIUM: 8.4 mg/dL — AB (ref 8.9–10.3)
CHLORIDE: 91 mmol/L — AB (ref 98–111)
CHLORIDE: 89 mmol/L — AB (ref 98–111)
CO2: 26 mmol/L (ref 22–32)
CO2: 29 mmol/L (ref 22–32)
Calcium: 8 mg/dL — ABNORMAL LOW (ref 8.9–10.3)
Creatinine, Ser: 0.67 mg/dL (ref 0.61–1.24)
Creatinine, Ser: 0.67 mg/dL (ref 0.61–1.24)
GFR calc Af Amer: >60 mL/min (ref >60)
GFR calc non Af Amer: >60 mL/min (ref >60)
GFR calc non Af Amer: >60 mL/min (ref >60)
GLUCOSE: 121 mg/dL — AB (ref 70–99)
Glucose, Bld: 104 mg/dL — ABNORMAL HIGH (ref 70–99)
POTASSIUM: 4.7 mmol/L (ref 3.5–5.1)
Potassium: 4.6 mmol/L (ref 3.5–5.1)
Sodium: 125 mmol/L — ABNORMAL LOW (ref 135–145)
Sodium: 126 mmol/L — ABNORMAL LOW (ref 135–145)

## 2018-03-10 LAB — HEPATIC FUNCTION PANEL
ALBUMIN: 2.5 g/dL — AB (ref 3.5–5.0)
ALK PHOS: 89 U/L (ref 38–126)
ALT: 59 U/L — AB (ref 0–44)
AST: 43 U/L — AB (ref 15–41)
BILIRUBIN TOTAL: 0.5 mg/dL (ref 0.3–1.2)
Bilirubin, Direct: <0.1 mg/dL (ref 0.0–0.2)
Total Protein: 6.5 g/dL (ref 6.5–8.1)

## 2018-03-10 LAB — URINALYSIS, COMPLETE (UACMP) WITH MICROSCOPIC
BACTERIA UA: NONE SEEN
Bilirubin Urine: NEGATIVE
Glucose, UA: NEGATIVE mg/dL
Hgb urine dipstick: NEGATIVE
Ketones, ur: NEGATIVE mg/dL
Leukocytes, UA: NEGATIVE
Nitrite: NEGATIVE
PROTEIN: NEGATIVE mg/dL
Specific Gravity, Urine: 1.01 (ref 1.005–1.030)
pH: 9 — ABNORMAL HIGH (ref 5.0–8.0)

## 2018-03-10 LAB — AMMONIA: AMMONIA: 15 umol/L (ref 9–35)

## 2018-03-10 LAB — MAGNESIUM: Magnesium: 1.6 mg/dL — ABNORMAL LOW (ref 1.7–2.4)

## 2018-03-10 LAB — PHOSPHORUS: PHOSPHORUS: 2.8 mg/dL (ref 2.5–4.6)

## 2018-03-10 MED ORDER — AMOXICILLIN-POT CLAVULANATE 400-57 MG/5ML PO SUSR
800.0000 mg | Freq: Two times a day (BID) | ORAL | Status: DC
  Administered 2018-03-10 – 2018-03-12 (×4): 800 mg
  Filled 2018-03-10 (×5): qty 10

## 2018-03-10 MED ORDER — SENNOSIDES 8.8 MG/5ML PO SYRP
5.0000 mL | ORAL_SOLUTION | Freq: Every day | ORAL | Status: DC | PRN
  Filled 2018-03-10: qty 5

## 2018-03-10 MED ORDER — SODIUM BICARBONATE 650 MG PO TABS
1300.0000 mg | ORAL_TABLET | Freq: Once | ORAL | Status: AC
  Administered 2018-03-10: 1300 mg
  Filled 2018-03-10: qty 2

## 2018-03-10 MED ORDER — HYDRALAZINE HCL 20 MG/ML IJ SOLN: 10.0000 mg | INTRAMUSCULAR | Status: DC | PRN

## 2018-03-10 MED ORDER — LEVOTHYROXINE SODIUM 50 MCG PO TABS
50.0000 ug | ORAL_TABLET | Freq: Every day | ORAL | Status: DC
  Administered 2018-03-11 – 2018-03-12 (×2): 50 ug
  Filled 2018-03-10 (×2): qty 1

## 2018-03-10 MED ORDER — POLYETHYLENE GLYCOL 3350 17 G PO PACK: 17.0000 g | PACK | Freq: Every day | ORAL | Status: DC | PRN

## 2018-03-10 MED ORDER — ACETAMINOPHEN 325 MG PO TABS
650.0000 mg | ORAL_TABLET | Freq: Four times a day (QID) | ORAL | Status: DC | PRN
  Administered 2018-03-11: 20:00:00 650 mg
  Filled 2018-03-10: qty 2

## 2018-03-10 MED ORDER — FENTANYL 12 MCG/HR TD PT72: 12.5000 ug | MEDICATED_PATCH | TRANSDERMAL | Status: DC

## 2018-03-10 MED ORDER — ONDANSETRON HCL 4 MG/2ML IJ SOLN
4.0000 mg | Freq: Four times a day (QID) | INTRAMUSCULAR | Status: DC | PRN
  Administered 2018-03-11: 4 mg via INTRAVENOUS
  Filled 2018-03-10: qty 2

## 2018-03-10 MED ORDER — SODIUM CHLORIDE 0.9% FLUSH
3.0000 mL | Freq: Two times a day (BID) | INTRAVENOUS | Status: DC
Start: 2018-03-10 — End: 2018-03-12
  Administered 2018-03-10 – 2018-03-11 (×2): 3 mL via INTRAVENOUS

## 2018-03-10 MED ORDER — ACETAMINOPHEN 650 MG RE SUPP: 650.0000 mg | Freq: Four times a day (QID) | RECTAL | Status: DC | PRN

## 2018-03-10 MED ORDER — AMLODIPINE BESYLATE 5 MG PO TABS
2.5000 mg | ORAL_TABLET | Freq: Every day | ORAL | Status: DC
  Administered 2018-03-11 – 2018-03-12 (×2): 2.5 mg
  Filled 2018-03-10 (×3): qty 1

## 2018-03-10 MED ORDER — DOCUSATE SODIUM 50 MG/5ML PO LIQD
100.0000 mg | Freq: Every day | ORAL | Status: DC | PRN
  Filled 2018-03-10: qty 10

## 2018-03-10 MED ORDER — GUAIFENESIN ER 600 MG PO TB12
600.0000 mg | ORAL_TABLET | Freq: Two times a day (BID) | ORAL | Status: DC
  Administered 2018-03-10 – 2018-03-11 (×2): 600 mg via ORAL
  Filled 2018-03-10 (×4): qty 1

## 2018-03-10 MED ORDER — ONDANSETRON HCL 4 MG PO TABS: 4.0000 mg | ORAL_TABLET | Freq: Four times a day (QID) | ORAL | Status: DC | PRN

## 2018-03-10 MED ORDER — IPRATROPIUM-ALBUTEROL 0.5-2.5 (3) MG/3ML IN SOLN
3.0000 mL | Freq: Four times a day (QID) | RESPIRATORY_TRACT | Status: DC
  Administered 2018-03-10 – 2018-03-12 (×2): 3 mL via RESPIRATORY_TRACT
  Filled 2018-03-10 (×6): qty 3

## 2018-03-10 MED ORDER — SODIUM CHLORIDE 1 G PO TABS
2.0000 g | ORAL_TABLET | Freq: Three times a day (TID) | ORAL | Status: DC
  Administered 2018-03-10 – 2018-03-12 (×5): 2 g
  Filled 2018-03-10 (×8): qty 2

## 2018-03-10 MED ORDER — OSMOLITE 1.5 CAL PO LIQD
1000.0000 mL | ORAL | Status: DC
  Administered 2018-03-10: 1000 mL

## 2018-03-10 MED ORDER — FREE WATER
50.0000 mL | Status: DC
  Administered 2018-03-10 – 2018-03-11 (×3): 50 mL

## 2018-03-10 MED ORDER — FLUTICASONE PROPIONATE 50 MCG/ACT NA SUSP
1.0000 | Freq: Every day | NASAL | Status: DC
  Filled 2018-03-10: qty 16

## 2018-03-10 MED ORDER — VENLAFAXINE HCL 37.5 MG PO TABS
37.5000 mg | ORAL_TABLET | Freq: Two times a day (BID) | ORAL | Status: DC
  Administered 2018-03-11 – 2018-03-12 (×3): 37.5 mg
  Filled 2018-03-10 (×6): qty 1

## 2018-03-10 MED ORDER — ENOXAPARIN SODIUM 40 MG/0.4ML ~~LOC~~ SOLN
40.0000 mg | SUBCUTANEOUS | Status: DC
  Administered 2018-03-10: 22:00:00 40 mg via SUBCUTANEOUS
  Filled 2018-03-10: qty 0.4

## 2018-03-10 MED ORDER — MOMETASONE FURO-FORMOTEROL FUM 100-5 MCG/ACT IN AERO
2.0000 | INHALATION_SPRAY | Freq: Two times a day (BID) | RESPIRATORY_TRACT | Status: DC
  Administered 2018-03-10 – 2018-03-11 (×2): 2 via RESPIRATORY_TRACT
  Filled 2018-03-10: qty 8.8

## 2018-03-10 MED ORDER — TAMSULOSIN HCL 0.4 MG PO CAPS
0.4000 mg | ORAL_CAPSULE | Freq: Every day | ORAL | Status: DC
  Administered 2018-03-11 – 2018-03-12 (×2): 0.4 mg via ORAL
  Filled 2018-03-10 (×2): qty 1

## 2018-03-10 MED ORDER — SODIUM CHLORIDE 0.9 % IV BOLUS
1000.0000 mL | Freq: Once | INTRAVENOUS | Status: AC
  Administered 2018-03-10: 1000 mL via INTRAVENOUS

## 2018-03-10 MED ORDER — SODIUM CHLORIDE 0.9% FLUSH: 3.0000 mL | INTRAVENOUS | Status: DC | PRN

## 2018-03-10 MED ORDER — CLONIDINE HCL 0.1 MG/24HR TD PTWK: 0.1000 mg | MEDICATED_PATCH | TRANSDERMAL | Status: DC

## 2018-03-10 MED ORDER — SODIUM CHLORIDE 0.9 % IV SOLN: 250.0000 mL | INTRAVENOUS | Status: DC | PRN

## 2018-03-10 MED ORDER — METOPROLOL TARTRATE 50 MG PO TABS
100.0000 mg | ORAL_TABLET | Freq: Two times a day (BID) | ORAL | Status: DC
  Administered 2018-03-10 – 2018-03-12 (×4): 100 mg
  Filled 2018-03-10 (×4): qty 2

## 2018-03-10 NOTE — ED Notes (Signed)
Pt had existing foley catheter prior to arrival to ED.

## 2018-03-10 NOTE — ED Notes (Signed)
Pt returned from radiology.

## 2018-03-10 NOTE — H&P (Signed)
[place] [facility] [place] NAME: Thomas [name]    MR#:  [id number]  DATE OF BIRTH:  Jul 30, 1951  DATE OF ADMISSION:  03/10/2018  PRIMARY CARE PHYSICIAN: [place], [facility]   REQUESTING/REFERRING PHYSICIAN:   CHIEF COMPLAINT:   Chief Complaint  Patient presents with  . abnormal lab values    HISTORY OF PRESENT ILLNESS: Thomas [name]  is a 67 y.o. 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  PAST MEDICAL HISTORY:   Past Medical History:  Diagnosis Date  . 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 Enlarged prostate   . Hypertension   . Lung infection    no lung cancer  . Radiation    to neck  . Renal disorder   . Skin cancer   . Status post chemotherapy   . Tongue cancer ([place])     PAST SURGICAL HISTORY:  Past Surgical History:  Procedure Laterality Date  . cancerous lymph nodes removed from neck    . IR FLUORO RM 30-60 MIN  02/25/2018  . KNEE SURGERY Right   . LAPAROSCOPIC INSERTION GASTROSTOMY TUBE N/A 02/27/2018   Procedure: LAPAROSCOPIC INSERTION GASTROSTOMY TUBE;  Surgeon: Benjamine Sprague, DO;  Location: [facility] ORS;  Service: General;  Laterality: N/A;  . SKIN BIOPSY      SOCIAL HISTORY:  Social History   Tobacco Use   . Smoking status: Heavy Tobacco Smoker    Types: Cigarettes  . Smokeless tobacco: Never Used  Substance Use Topics  . Alcohol use: Yes    Comment: 40oz x4 daily    FAMILY HISTORY:  Family History  Problem Relation Age of Onset  . Hypertension Mother     DRUG ALLERGIES: No Known Allergies  REVIEW OF SYSTEMS: Poor historian due to confusion  CONSTITUTIONAL: No fever, fatigue or weakness.  EYES: No blurred or double vision.  EARS, NOSE, AND THROAT: No tinnitus or ear pain.  RESPIRATORY: No cough, shortness of breath, wheezing or hemoptysis.  CARDIOVASCULAR: No chest pain, orthopnea, edema.  GASTROINTESTINAL: No nausea, vomiting, diarrhea or abdominal pain.  GENITOURINARY: No dysuria, hematuria.  ENDOCRINE: No polyuria, nocturia,  HEMATOLOGY: No anemia, easy bruising or bleeding SKIN: No rash or lesion. MUSCULOSKELETAL: No joint pain or arthritis.   NEUROLOGIC: No tingling, numbness, weakness. + Confusion PSYCHIATRY: No anxiety or depression.   MEDICATIONS AT HOME:  Prior to Admission medications   Medication Sig Start Date End Date Taking? Authorizing Provider  acidophilus (RISAQUAD) CAPS capsule Take 1 capsule by mouth daily. 03/08/18   [name], MD  amLODipine (NORVASC) 2.5 MG tablet Place 1 tablet (2.5 mg total) into feeding tube daily. 03/02/18   Hillary Bow, MD  amoxicillin-clavulanate (AUGMENTIN) 400-57 MG/5ML suspension Place 10 mLs (800 mg total) into feeding tube every 12 (twelve) hours for 5 days. 03/07/18 03/12/18  [name], MD  budesonide-formoterol [facility])  80-4.5 MCG/ACT inhaler Inhale 2 puffs into the lungs 2 (two) times daily.    [provider]  clonazePAM (KLONOPIN) 1 MG tablet Take 1 tablet (1 mg total) by mouth 3 (three) times daily as needed for anxiety. 03/08/18   [name], MD  cloNIDine (CATAPRES - DOSED IN MG/24 HR) 0.1 mg/24hr patch Place 1 patch (0.1 mg total) onto the skin once a week. 03/05/18   Hillary Bow, MD   docusate (COLACE) 50 MG/5ML liquid Place 10 mLs (100 mg total) into feeding tube daily as needed for mild constipation. 03/07/18   [name], MD  DULoxetine (CYMBALTA) 30 MG capsule Take 30 mg by mouth daily.    [provider]  fentaNYL (DURAGESIC - DOSED MCG/HR) 12 MCG/HR Place 1 patch (12.5 mcg total) onto the skin every 3 (three) days. 03/08/18   [name], MD  fluticasone (FLONASE) 50 MCG/ACT nasal spray Place 1 spray into both nostrils daily.    [provider]  guaiFENesin (MUCINEX) 600 MG 12 hr tablet Take 1 tablet (600 mg total) by mouth 2 (two) times daily. 03/07/18 06/05/18  [name], MD  levothyroxine (SYNTHROID, LEVOTHROID) 50 MCG tablet Take 1 tablet (50 mcg total) by mouth daily before breakfast. Patient not taking: Reported on 02/19/2018 02/09/16   [name], MD  metoprolol tartrate (LOPRESSOR) 100 MG tablet Place 1 tablet (100 mg total) into feeding tube 2 (two) times daily. 03/07/18   [name], MD  Nutritional Supplements (FEEDING SUPPLEMENT, OSMOLITE 1.5 CAL,) LIQD Place 1,000 mLs into feeding tube daily. At 50 cc/hr 03/08/18   [name], MD  sennosides (SENOKOT) 8.8 MG/5ML syrup Place 5 mLs into feeding tube daily as needed for mild constipation. 03/07/18   [name], MD  tamsulosin (FLOMAX) 0.4 MG CAPS capsule Take 1 capsule (0.4 mg total) by mouth daily. 03/08/18   [name], MD  venlafaxine (EFFEXOR) 37.5 MG tablet Place 1 tablet (37.5 mg total) into feeding tube 2 (two) times daily with a meal. 03/01/18   Hillary Bow, MD  Water For Irrigation, Sterile (FREE WATER) SOLN Place 50 mLs into feeding tube every 4 (four) hours. 03/07/18   [name], MD      PHYSICAL EXAMINATION:   VITAL SIGNS: Blood pressure (!) 169/78, pulse 75, temperature 98.3 F (36.8 C), temperature source Oral, resp. rate (!) 21, height 5\' 7"  (1.702 m), weight 58.7 kg (129 lb 6.4 oz), SpO2 100 %.  GENERAL:  66  y.o.-year-old patient lying in the bed with no acute distress.  Frail/cachectic appearing EYES: Pupils equal, round, reactive to light and accommodation. No scleral icterus. Extraocular muscles intact.  HEENT: Head atraumatic, normocephalic. Oropharynx and nasopharynx clear.  NECK:  Supple, no jugular venous distention. No thyroid enlargement, no tenderness.  LUNGS: Diffuse rhonchi/wheezing throughout. No use of accessory muscles of respiration.  CARDIOVASCULAR: S1, S2 normal. No murmurs, rubs, or gallops.  ABDOMEN: Soft, nontender, nondistended. Bowel sounds present. No organomegaly or mass.  EXTREMITIES: No pedal edema, cyanosis, or clubbing.  Diffuse muscular wasting NEUROLOGIC: Cranial nerves II through XII are intact. MAES-no focal weakness. Gait not checked.  PSYCHIATRIC: The patient is alert awake,, oriented x2/confused.  SKIN: No obvious rash, lesion, or ulcer.   LABORATORY PANEL:   CBC Recent Labs  Lab 03/07/18 0523 03/10/18 1430  WBC 8.6 13.6*  HGB 8.1* 11.2*  HCT 23.7* 32.3*  PLT 317 568*  MCV 85.9 85.0  MCH 29.4 29.5  MCHC 34.2 34.7  RDW 16.0* 16.1*  LYMPHSABS  --  0.7*  MONOABS  --  0.8  EOSABS  --  0.1  BASOSABS  --  0.0   ------------------------------------------------------------------------------------------------------------------  Chemistries  Recent Labs  Lab 03/04/18 0440 03/05/18 0455 03/06/18 0335 03/07/18 0523 03/08/18 0450 03/10/18 0929 03/10/18 1430  NA 132* 128* 129* 130* 133* 125* 126*  K 3.6 3.5 3.2* 3.5 4.0 4.7 4.6  CL 95* 91* 90* 93* 93* 91* 89*  CO2 32 32 32 34* 34* 26 29  GLUCOSE 116* 130* 117* 129* 105* 121* 104*  BUN 9 11 14 15 14 14 14   CREATININE 0.67 0.57* 0.99 0.77 0.71 0.67 0.67  CALCIUM 7.6* 7.6* 7.1* 7.3* 7.6* 8.0* 8.4*  MG 1.9 1.9  --  1.9 1.9 1.6*  --   AST  --   --   --   --   --   --  43*  ALT  --   --   --   --   --   --  59*  ALKPHOS  --   --   --   --   --   --  89  BILITOT  --   --   --   --   --   --  0.5    ------------------------------------------------------------------------------------------------------------------ estimated creatinine clearance is 74.4 mL/min (by C-G formula based on SCr of 0.67 mg/dL). ------------------------------------------------------------------------------------------------------------------ No results for input(s): TSH, T4TOTAL, T3FREE, THYROIDAB in the last 72 hours.  Invalid input(s): FREET3   Coagulation profile No results for input(s): INR, PROTIME in the last 168 hours. ------------------------------------------------------------------------------------------------------------------- No results for input(s): DDIMER in the last 72 hours. -------------------------------------------------------------------------------------------------------------------  Cardiac Enzymes No results for input(s): CKMB, TROPONINI, MYOGLOBIN in the last 168 hours.  Invalid input(s): CK ------------------------------------------------------------------------------------------------------------------ Invalid input(s): POCBNP  ---------------------------------------------------------------------------------------------------------------  Urinalysis    Component Value Date/Time   COLORURINE YELLOW (A) 03/10/2018 1555   APPEARANCEUR CLEAR (A) 03/10/2018 1555   APPEARANCEUR Clear 02/19/2013 0757   LABSPEC 1.010 03/10/2018 1555   LABSPEC 1.016 02/19/2013 0757   PHURINE 9.0 (H) 03/10/2018 1555   GLUCOSEU NEGATIVE 03/10/2018 1555   GLUCOSEU 150 mg/dL 02/19/2013 0757   HGBUR NEGATIVE 03/10/2018 1555   BILIRUBINUR NEGATIVE 03/10/2018 1555   BILIRUBINUR Negative 02/19/2013 [place] 03/10/2018 1555   PROTEINUR NEGATIVE 03/10/2018 1555   UROBILINOGEN 0.2 06/02/2014 2111   NITRITE NEGATIVE 03/10/2018 1555   LEUKOCYTESUR NEGATIVE 03/10/2018 1555   LEUKOCYTESUR Negative 02/19/2013 0757     RADIOLOGY: No results found.  EKG: Orders placed or performed in  visit on 03/10/18  . 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/alcohol abuse, presents to emergency room with hyponatremia of 125 with acute confusion  *Acute toxic metabolic encephalopathy/confusion   Suspect secondary to acute on chronic hyponatremia  Admit to regular nursing for bed, salt tablets with fluid restriction, BMP in the morning, check ammonia level, check CT of the head for further evaluation, neurochecks per routine, aspiration/fall precautions while in house   *Acute on chronic hyponatremia  Secondary to SIADH from squamous cell recurrent head neck cancer and noted alcohol abuse history  Salt tablets 3 times daily, fluid restriction-1 L daily, nephrology to see, BMP in the morning  *Acute respiratory failure due secondary to aspiration pneumonia Discharged to skilled nursing facility with this diagnosis Continue Augmentin-we will complete  antibiotic course on March 12, 2018, chest x-ray noted for improvement, continue aggressive pulmonary toilet with bronchodilator therapy, supplemental oxygen weaning as tolerated-on 2 to 3 L at baseline  *Chronic dysphagia  Stable -status post PEG tube last admission Continue tube feeds   *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 Management plans discussed with the patient, family and they are in agreement.  CODE STATUS:full Code Status History    Date Active Date Inactive Code Status Order ID Comments User Context   02/19/2018 1552 03/08/2018 1453 Full Code [id number]  Gorden Harms, MD Inpatient   02/19/2016 1355 02/22/2016 2000 Full Code [id number]  Lavetta Nielsen Aaron Mose, MD ED   02/07/2016 0230 02/09/2016 1859 Full Code [id number]  Harrie Foreman, MD Inpatient   01/22/2016 0552 01/26/2016 1805 Full Code [id number]  Harrie Foreman, MD Inpatient   08/17/2015 2229 08/21/2015 1800 Full Code [id number]  Lance Coon, MD Inpatient   12/12/2014 0119 12/18/2014 1659 Full Code [id number]  Hower, Aaron Mose, MD ED       TOTAL TIME TAKING CARE OF THIS PATIENT: 45 minutes.    Avel Peace Salary M.D on 03/10/2018   Between 7am to 6pm - Pager - [phone]  After 6pm go to [email or web address] - password [email or web address] [facility]  Office  [phone]  CC: Primary care physician; [facility]   Note: This dictation was prepared with Dragon dictation along with smaller phrase technology. Any transcriptional errors that result from this process are unintentional.

## 2018-03-10 NOTE — ED Provider Notes (Signed)
[facility] Emergency Department Provider Note    First MD Initiated Contact with Patient 03/10/18 1420     (approximate)  I have reviewed the triage vital signs and the nursing notes.   HISTORY  Chief Complaint abnormal lab values    HPI Thomas [name] is a 67 y.o. male with a history of cancer status post chemotherapy presents from [facility] due to falling sodium levels.  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  Patient providing minimal history very quiet.  Denies any pain.  Denies any nausea or vomiting.  Per report his sodium level facility today was 125.  It was 133 upon hospital discharge.    Past Medical History:  Diagnosis Date  . 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 Enlarged prostate   . Hypertension   . Lung infection    no lung cancer  . Radiation    to neck  . Renal disorder   . Skin cancer   . Status post chemotherapy   . Tongue cancer ([place])    Family History  Problem Relation Age of Onset  . Hypertension Mother    Past Surgical History:  Procedure Laterality Date  . cancerous lymph nodes removed from neck    . IR FLUORO RM 30-60 MIN  02/25/2018  . KNEE SURGERY Right   . LAPAROSCOPIC INSERTION GASTROSTOMY TUBE N/A 02/27/2018   Procedure: LAPAROSCOPIC INSERTION GASTROSTOMY TUBE;  Surgeon: Benjamine Sprague, DO;  Location: [facility] ORS;  Service: General;  Laterality: N/A;  . SKIN BIOPSY     Patient Active Problem List   Diagnosis Date Noted  . Encephalopathy acute 03/10/2018  . Pressure injury of skin 03/10/2018  . Dysphagia   . Neck mass   . Head and neck cancer ([place]) 02/19/2018  . Cervical lymphadenopathy   . Poor appetite   . Loss of weight   . History of tongue cancer   . Protein-calorie malnutrition, severe 01/22/2016  . Alcohol use disorder, severe, dependence ([place]) 12/12/2014  . Hyponatremia 12/12/2014  . Essential hypertension 12/12/2014  . COPD (chronic obstructive pulmonary  disease) ([place]) 12/12/2014      Prior to Admission medications   Medication Sig Start Date End Date Taking? Authorizing Provider  acidophilus (RISAQUAD) CAPS capsule Take 1 capsule by mouth daily. 03/08/18  Yes [name], MD  amLODipine (NORVASC) 2.5 MG tablet Place 1 tablet (2.5 mg total) into feeding tube daily. 03/02/18  Yes Hillary Bow, MD  amoxicillin-clavulanate (AUGMENTIN) 400-57 MG/5ML suspension Place 10 mLs (800 mg total) into feeding tube every 12 (twelve) hours for 5 days. 03/07/18 03/12/18 Yes [name], MD  budesonide-formoterol (SYMBICORT) 80-4.5 MCG/ACT inhaler Inhale 2 puffs into the lungs 2 (two) times daily.   Yes [provider]  clonazePAM (KLONOPIN) 1 MG tablet Take 1 tablet (1 mg total) by mouth 3 (three) times daily as needed for anxiety. 03/08/18  Yes [name], MD  cloNIDine (CATAPRES - DOSED IN MG/24 HR) 0.1 mg/24hr patch Place 1 patch (0.1 mg total) onto the skin once a week. 03/05/18  Yes Sudini, Alveta Heimlich, MD  docusate (COLACE) 50 MG/5ML liquid Place 10 mLs (100 mg total) into feeding tube daily as needed for mild constipation. 03/07/18  Yes [name], MD  fentaNYL (DURAGESIC - DOSED MCG/HR) 12 MCG/HR Place 1 patch (12.5 mcg total) onto the skin every 3 (three) days. 03/08/18  Yes [name], MD  fluticasone (FLONASE) 50 MCG/ACT nasal spray Place  1 spray into both nostrils daily.   Yes [provider]  guaiFENesin (MUCINEX) 600 MG 12 hr tablet Take 1 tablet (600 mg total) by mouth 2 (two) times daily. Patient taking differently: Take 600 mg by mouth 3 (three) times daily.  03/07/18 06/05/18 Yes [name], MD  levothyroxine (SYNTHROID, LEVOTHROID) 50 MCG tablet Take 1 tablet (50 mcg total) by mouth daily before breakfast. 02/09/16  Yes [name], MD  magnesium oxide (MAG-OX) 400 MG tablet Place 400 mg into feeding tube daily.   Yes [provider]  metoprolol tartrate (LOPRESSOR) 100 MG tablet Place  1 tablet (100 mg total) into feeding tube 2 (two) times daily. 03/07/18  Yes [name], MD  sennosides (SENOKOT) 8.8 MG/5ML syrup Place 5 mLs into feeding tube daily as needed for mild constipation. 03/07/18  Yes [name], MD  venlafaxine (EFFEXOR) 37.5 MG tablet Place 1 tablet (37.5 mg total) into feeding tube 2 (two) times daily with a meal. 03/01/18  Yes Sudini, Alveta Heimlich, MD  Water For Irrigation, Sterile (FREE WATER) SOLN Place 50 mLs into feeding tube every 4 (four) hours. 03/07/18  Yes [name], MD  Nutritional Supplements (FEEDING SUPPLEMENT, OSMOLITE 1.5 CAL,) LIQD Place 1,000 mLs into feeding tube daily. At 50 cc/hr Patient not taking: Reported on 03/10/2018 03/08/18   [name], MD  tamsulosin (FLOMAX) 0.4 MG CAPS capsule Take 1 capsule (0.4 mg total) by mouth daily. Patient not taking: Reported on 03/10/2018 03/08/18   [name], MD    Allergies Patient has no known allergies.    Social History Social History   Tobacco Use  . Smoking status: Heavy Tobacco Smoker    Types: Cigarettes  . Smokeless tobacco: Never Used  Substance Use Topics  . Alcohol use: Yes    Comment: 40oz x4 daily  . Drug use: No    Review of Systems Patient denies headaches, rhinorrhea, blurry vision, numbness, shortness of breath, chest pain, edema, cough, abdominal pain, nausea, vomiting, diarrhea, dysuria, fevers, rashes or hallucinations unless otherwise stated above in HPI. ____________________________________________   PHYSICAL EXAM:  VITAL SIGNS: Vitals:   03/10/18 1931 03/10/18 1944  BP: (!) 170/91   Pulse: 81   Resp: 18   Temp: (!) 97.5 F (36.4 C)   SpO2: 99% 99%    Constitutional: Alert and oriented.  Eyes: Conjunctivae are normal.  Head: Atraumatic. Nose: No congestion/rhinnorhea. Mouth/Throat: Mucous membranes are moist.   Neck: No stridor. Painless ROM.  Cardiovascular: Normal rate, regular rhythm. Grossly normal heart sounds.  Good  peripheral circulation. Respiratory: Normal respiratory effort.  No retractions. Lungs CTAB. Gastrointestinal: Soft and nontender. No distention. No abdominal bruits. No CVA tenderness. Genitourinary:  Musculoskeletal: No lower extremity tenderness nor edema.  No joint effusions. Neurologic:  Normal speech and language. No gross focal neurologic deficits are appreciated. No facial droop Skin:  Skin is warm, dry and intact. No rash noted. Psychiatric: Mood and affect are normal. Speech and behavior are normal.  ____________________________________________   LABS (all labs ordered are listed, but only abnormal results are displayed)  Results for orders placed or performed during the hospital encounter of 03/10/18 (from the past 24 hour(s))  Basic metabolic panel     Status: Abnormal   Collection Time: 03/10/18  2:30 PM  Result Value Ref [place]   Sodium 126 (L) 135 - 145 mmol/L   Potassium 4.6 3.5 - 5.1 mmol/L   Chloride 89 (L) 98 - 111 mmol/L   CO2 29 22 - 32 mmol/L  Glucose, Bld 104 (H) 70 - 99 mg/dL   BUN 14 8 - 23 mg/dL   Creatinine, Ser 0.67 0.61 - 1.24 mg/dL   Calcium 8.4 (L) 8.9 - 10.3 mg/dL   GFR calc non Af Amer >60 >60 mL/min   GFR calc Af Amer >60 >60 mL/min   Anion gap 8 5 - 15  CBC with Differential/Platelet     Status: Abnormal   Collection Time: 03/10/18  2:30 PM  Result Value Ref [place]   WBC 13.6 (H) 3.8 - 10.6 K/uL   RBC 3.80 (L) 4.40 - 5.90 MIL/uL   Hemoglobin 11.2 (L) 13.0 - 18.0 g/dL   HCT 32.3 (L) 40.0 - 52.0 %   MCV 85.0 80.0 - 100.0 fL   MCH 29.5 26.0 - 34.0 pg   MCHC 34.7 32.0 - 36.0 g/dL   RDW 16.1 (H) 11.5 - 14.5 %   Platelets 568 (H) 150 - 440 K/uL   Neutrophils Relative % 88 %   Neutro Abs 11.9 (H) 1.4 - 6.5 K/uL   Lymphocytes Relative 5 %   Lymphs Abs 0.7 (L) 1.0 - 3.6 K/uL   Monocytes Relative 6 %   Monocytes Absolute 0.8 0.2 - 1.0 K/uL   Eosinophils Relative 1 %   Eosinophils Absolute 0.1 0 - 0.7 K/uL   Basophils Relative 0 %   Basophils  Absolute 0.0 0 - 0.1 K/uL  Hepatic function panel     Status: Abnormal   Collection Time: 03/10/18  2:30 PM  Result Value Ref [place]   Total Protein 6.5 6.5 - 8.1 g/dL   Albumin 2.5 (L) 3.5 - 5.0 g/dL   AST 43 (H) 15 - 41 U/L   ALT 59 (H) 0 - 44 U/L   Alkaline Phosphatase 89 38 - 126 U/L   Total Bilirubin 0.5 0.3 - 1.2 mg/dL   Bilirubin, Direct <0.1 0.0 - 0.2 mg/dL   Indirect Bilirubin NOT CALCULATED 0.3 - 0.9 mg/dL  Urinalysis, Complete w Microscopic     Status: Abnormal   Collection Time: 03/10/18  3:55 PM  Result Value Ref [place]   Color, Urine YELLOW (A) YELLOW   APPearance CLEAR (A) CLEAR   Specific Gravity, Urine 1.010 1.005 - 1.030   pH 9.0 (H) 5.0 - 8.0   Glucose, UA NEGATIVE NEGATIVE mg/dL   Hgb urine dipstick NEGATIVE NEGATIVE   Bilirubin Urine NEGATIVE NEGATIVE   Ketones, ur NEGATIVE NEGATIVE mg/dL   Protein, ur NEGATIVE NEGATIVE mg/dL   Nitrite NEGATIVE NEGATIVE   Leukocytes, UA NEGATIVE NEGATIVE   RBC / HPF 11-20 0 - 5 RBC/hpf   WBC, UA 0-5 0 - 5 WBC/hpf   Bacteria, UA NONE SEEN NONE SEEN   Squamous Epithelial / LPF 0-5 0 - 5   Mucus PRESENT   Ammonia     Status: None   Collection Time: 03/10/18  7:02 PM  Result Value Ref [place]   Ammonia 15 9 - 35 umol/L   ____________________________________________ EKG My review and personal interpretation at Time: 14:33   Indication: hyponatremia  Rate: 75  Rhythm: sinus Axis: leftward Other: no stemi, normal intervals ____________________________________________  RADIOLOGY  I personally reviewed all radiographic images ordered to evaluate for the above acute complaints and reviewed radiology reports and findings.  These findings were personally discussed with the patient.  Please see medical record for radiology report.  ____________________________________________   PROCEDURES  Procedure(s) performed:  Procedures  CRITICAL CARE Performed by: Merlyn Lot   Total critical care time:  35 minutes  Critical  care time was exclusive of separately billable procedures and treating other patients.  Critical care was necessary to treat or prevent imminent or life-threatening deterioration.  Critical care was time spent personally by me on the following activities: development of treatment plan with patient and/or surrogate as well as nursing, discussions with consultants, evaluation of patient's response to treatment, examination of patient, obtaining history from patient or surrogate, ordering and performing treatments and interventions, ordering and review of laboratory studies, ordering and review of radiographic studies, pulse oximetry and re-evaluation of patient's condition.     Critical Care performed: yes ____________________________________________   INITIAL IMPRESSION / ASSESSMENT AND PLAN / ED COURSE  Pertinent labs & imaging results that were available during my care of the patient were reviewed by me and considered in my medical decision making (see chart for details).   DDX: Dehydration, sepsis, pna, uti, hypoglycemia, cva, drug effect, withdrawal, encephalitis   EOIN WILLDEN is a 67 y.o. who presents to the ED with extensive past medical history presenting to the ER with more confusion and downtrending sodium now acutely hyponatremic to 125.  Will repeat blood work.  Will provide IVF.  The patient will be placed on continuous pulse oximetry and telemetry for monitoring.  Laboratory evaluation will be sent to evaluate for the above complaints.     Clinical Course as of Mar 10 2104  Nancy Fetter Mar 10, 2018  1604 Lengthy discussion with patient's daughter regarding goals of care and presentation.  She states that he has been more confused over the past 48 hours and this is actually what led him to repeat blood work yesterday showing recurrent hyponatremia.  Has been reportedly compliant with medications and antibiotics.  Discussed option with patient family regarding IV fluids, oral salt  replacement and repeat BMP with discharge back to [facility] versus observation in the hospital for further medical management.  They prefer to be observed in the hospital which I think is reasonable particularly given his complex medical history.  Discussed case with hospitalist group who kindly agrees to admit patient for further medical management.   [PR]    Clinical Course User Index [PR] Merlyn Lot, MD     As part of my medical decision making, I reviewed the following data within the [facility] notes reviewed and incorporated, Labs reviewed, notes from prior ED visits.  ____________________________________________   FINAL CLINICAL IMPRESSION(S) / ED DIAGNOSES  Final diagnoses:  Confusion  Hyponatremia      NEW MEDICATIONS STARTED DURING THIS VISIT:  Current Discharge Medication List       Note:  This document was prepared using Dragon voice recognition software and may include unintentional dictation errors.    Merlyn Lot, MD 03/10/18 2106

## 2018-03-10 NOTE — Progress Notes (Signed)
Family Meeting Note  Advance Directive:yes  Today a meeting took place with the Patient.  Patient is able to participate   The following clinical team members were present during this meeting:MD  The following were discussed:Patient's diagnosis: Chronic hypoxic respiratory failure, chronic hyponatremia, chronic squamous cell head neck cancer, acute encephalopathy, Patient's progosis: Unable to determine and Goals for treatment: Full Code  Additional follow-up to be provided: prn  Time spent during discussion:20 minutes  Gorden Harms, MD

## 2018-03-10 NOTE — ED Triage Notes (Signed)
Per EMS report, patient is a resident of [facility] and has had decreasing sodium lab values and today's was 125, two days ago it was 133.

## 2018-03-11 ENCOUNTER — Other Ambulatory Visit: Payer: Self-pay

## 2018-03-11 DIAGNOSIS — Z7189 Other specified counseling: Secondary | ICD-10-CM

## 2018-03-11 DIAGNOSIS — Z515 Encounter for palliative care: Secondary | ICD-10-CM

## 2018-03-11 DIAGNOSIS — E871 Hypo-osmolality and hyponatremia: Secondary | ICD-10-CM

## 2018-03-11 LAB — SODIUM
Sodium: 124 mmol/L — ABNORMAL LOW (ref 135–145)
Sodium: 125 mmol/L — ABNORMAL LOW (ref 135–145)
Sodium: 129 mmol/L — ABNORMAL LOW (ref 135–145)

## 2018-03-11 LAB — BASIC METABOLIC PANEL
Anion gap: 7 (ref 5–15)
BUN: 14 mg/dL (ref 8–23)
CALCIUM: 8 mg/dL — AB (ref 8.9–10.3)
CO2: 28 mmol/L (ref 22–32)
CREATININE: 0.7 mg/dL (ref 0.61–1.24)
Chloride: 91 mmol/L — ABNORMAL LOW (ref 98–111)
GFR calc Af Amer: >60 mL/min (ref >60)
Glucose, Bld: 123 mg/dL — ABNORMAL HIGH (ref 70–99)
Potassium: 4.2 mmol/L (ref 3.5–5.1)
Sodium: 126 mmol/L — ABNORMAL LOW (ref 135–145)

## 2018-03-11 LAB — URIC ACID: Uric Acid, Serum: 1.5 mg/dL — ABNORMAL LOW (ref 3.7–8.6)

## 2018-03-11 LAB — SODIUM, URINE, RANDOM: Sodium, Ur: 139 mmol/L

## 2018-03-11 MED ORDER — OSMOLITE 1.5 CAL PO LIQD
1000.0000 mL | ORAL | Status: DC
  Administered 2018-03-11: 18:00:00 1000 mL

## 2018-03-11 MED ORDER — SODIUM CHLORIDE 0.9 % IV SOLN
INTRAVENOUS | Status: DC
  Administered 2018-03-11 – 2018-03-12 (×2): via INTRAVENOUS

## 2018-03-11 MED ORDER — OXYCODONE-ACETAMINOPHEN 5-325 MG PO TABS
1.0000 | ORAL_TABLET | Freq: Four times a day (QID) | ORAL | Status: DC | PRN
  Administered 2018-03-12: 1
  Filled 2018-03-11: qty 1

## 2018-03-11 MED ORDER — MAGNESIUM SULFATE 2 GM/50ML IV SOLN
2.0000 g | Freq: Once | INTRAVENOUS | Status: AC
  Administered 2018-03-11: 2 g via INTRAVENOUS
  Filled 2018-03-11: qty 50

## 2018-03-11 MED ORDER — FREE WATER
20.0000 mL | Status: DC
  Administered 2018-03-11 – 2018-03-12 (×7): 20 mL

## 2018-03-11 MED ORDER — ENOXAPARIN SODIUM 30 MG/0.3ML ~~LOC~~ SOLN
30.0000 mg | SUBCUTANEOUS | Status: DC
  Administered 2018-03-11: 30 mg via SUBCUTANEOUS
  Filled 2018-03-11: qty 0.3

## 2018-03-11 MED ORDER — OCUVITE-LUTEIN PO CAPS
1.0000 | ORAL_CAPSULE | Freq: Every day | ORAL | Status: DC
  Administered 2018-03-12: 1
  Filled 2018-03-11: qty 1

## 2018-03-11 NOTE — Consult Note (Addendum)
Consultation Note Date: 03/11/2018   Patient Name: Thomas [name]  DOB: August 12, 1950  MRN: [id number]  Age / Sex: 67 y.o., male  PCP: [facility] Referring Physician: Bettey Costa, MD  Reason for Consultation: Establishing goals of care  HPI/Patient Profile: Lena Fieldhouse  is a 67 y.o. male with a known history recurrent squamous cell head neck cancer, recently discharged from the hospital 3 days ago after 2 weeks day for aspiration pneumonia-currently on Augmentin/acute on chronic hyponatremia due to SIADH/chronic alcohol abuse, presents from [facility] with sodium level 125 on routine work-up for acute confusion, in the emergency room patient was found to have sodium 125.     Clinical Assessment and Goals of Care: Met with patient several times in previous visit. Previous head and neck cancer. He stated previously he has been through this before and knows what to expect.  Patient resting in bed. He is alert and oriented. Tube feeds in place. He is disappointed to be back in the hospital.   We discussed how he is doing. He states he feels bad. He tells me he wants everything done possible. He states he is not ready to die. He cannot think of a situation that would make him want to shift to comfort. He became irritated with conversation.        SUMMARY OF RECOMMENDATIONS    Patient wants all care possible.  Recommend palliative at D/C.    Code Status/Advance Care Planning:  Full code    Symptom Management:   Per primary team.   Palliative Prophylaxis:   Eye Care and Oral Care   Psycho-social/Spiritual:   Desire for further Chaplaincy support:yes  Prognosis:   Unable to determine  Discharge Planning: To Be Determined      Primary Diagnoses: Present on Admission: . Encephalopathy acute   I have reviewed the medical record, interviewed the patient and family, and  examined the patient. The following aspects are pertinent.  Past Medical History:  Diagnosis Date  . 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 Enlarged prostate   . Hypertension   . Lung infection    no lung cancer  . Radiation    to neck  . Renal disorder   . Skin cancer   . Status post chemotherapy   . Tongue cancer [facility])    Social History   Socioeconomic History  . Marital status: Divorced    Spouse name: Not on file  . Number of children: Not on file  . Years of education: Not on file  . Highest education level: Not on file  Occupational History  . Not on file  Social Needs  . Financial resource strain: Not on file  . Food insecurity:    Worry: Not on file    Inability: Not on file  . Transportation needs:    Medical: Not on file    Non-medical: Not on file  Tobacco Use  . Smoking status: Heavy Tobacco Smoker    Types: Cigarettes  . Smokeless  tobacco: Never Used  Substance and Sexual Activity  . Alcohol use: Yes    Comment: 40oz x4 daily  . Drug use: No  . Sexual activity: Not on file  Lifestyle  . Physical activity:    Days per week: Not on file    Minutes per session: Not on file  . Stress: Not on file  Relationships  . Social connections:    Talks on phone: Not on file    Gets together: Not on file    Attends religious service: Not on file    Active member of club or organization: Not on file    Attends meetings of clubs or organizations: Not on file    Relationship status: Not on file  Other Topics Concern  . Not on file  Social History Narrative  . Not on file   Family History  Problem Relation Age of Onset  . Hypertension Mother    Scheduled Meds: . amLODipine  2.5 mg Per Tube Daily  . amoxicillin-clavulanate  800 mg Per Tube Q12H  . [START ON 03/16/2018] cloNIDine  0.1 mg Transdermal Weekly  . enoxaparin (LOVENOX) injection  40 mg Subcutaneous Q24H  . [START ON 03/12/2018] fentaNYL  12.5 mcg Transdermal Q72H  . fluticasone  1 spray Each Nare  Daily  . free water  20 mL Per Tube Q4H  . guaiFENesin  600 mg Oral BID  . ipratropium-albuterol  3 mL Nebulization Q6H  . levothyroxine  50 mcg Per Tube QAC breakfast  . metoprolol tartrate  100 mg Per Tube BID  . mometasone-formoterol  2 puff Inhalation BID  . sodium chloride flush  3 mL Intravenous Q12H  . sodium chloride  2 g Per Tube TID  . tamsulosin  0.4 mg Oral Daily  . venlafaxine  37.5 mg Per Tube BID WC   Continuous Infusions: . sodium chloride    . sodium chloride 75 mL/hr at 03/11/18 1038  . feeding supplement (OSMOLITE 1.5 CAL)     PRN Meds:.sodium chloride, acetaminophen **OR** acetaminophen, docusate, hydrALAZINE, ondansetron **OR** ondansetron (ZOFRAN) IV, polyethylene glycol, sennosides, sodium chloride flush Medications Prior to Admission:  Prior to Admission medications   Medication Sig Start Date End Date Taking? Authorizing Provider  acidophilus (RISAQUAD) CAPS capsule Take 1 capsule by mouth daily. 03/08/18  Yes [name], MD  amLODipine (NORVASC) 2.5 MG tablet Place 1 tablet (2.5 mg total) into feeding tube daily. 03/02/18  Yes Hillary Bow, MD  amoxicillin-clavulanate (AUGMENTIN) 400-57 MG/5ML suspension Place 10 mLs (800 mg total) into feeding tube every 12 (twelve) hours for 5 days. 03/07/18 03/12/18 Yes [name], MD  budesonide-formoterol (SYMBICORT) 80-4.5 MCG/ACT inhaler Inhale 2 puffs into the lungs 2 (two) times daily.   Yes [provider]  clonazePAM (KLONOPIN) 1 MG tablet Take 1 tablet (1 mg total) by mouth 3 (three) times daily as needed for anxiety. 03/08/18  Yes [name], MD  cloNIDine (CATAPRES - DOSED IN MG/24 HR) 0.1 mg/24hr patch Place 1 patch (0.1 mg total) onto the skin once a week. 03/05/18  Yes Sudini, Alveta Heimlich, MD  docusate (COLACE) 50 MG/5ML liquid Place 10 mLs (100 mg total) into feeding tube daily as needed for mild constipation. 03/07/18  Yes [name], MD  fentaNYL (DURAGESIC - DOSED MCG/HR) 12 MCG/HR  Place 1 patch (12.5 mcg total) onto the skin every 3 (three) days. 03/08/18  Yes [name], MD  fluticasone (FLONASE) 50 MCG/ACT nasal spray Place 1 spray into both nostrils daily.   Yes  [provider]  guaiFENesin (MUCINEX) 600 MG 12 hr tablet Take 1 tablet (600 mg total) by mouth 2 (two) times daily. Patient taking differently: Take 600 mg by mouth 3 (three) times daily.  03/07/18 06/05/18 Yes [name], MD  levothyroxine (SYNTHROID, LEVOTHROID) 50 MCG tablet Take 1 tablet (50 mcg total) by mouth daily before breakfast. 02/09/16  Yes [name], MD  magnesium oxide (MAG-OX) 400 MG tablet Place 400 mg into feeding tube daily.   Yes [provider]  metoprolol tartrate (LOPRESSOR) 100 MG tablet Place 1 tablet (100 mg total) into feeding tube 2 (two) times daily. 03/07/18  Yes [name], MD  sennosides (SENOKOT) 8.8 MG/5ML syrup Place 5 mLs into feeding tube daily as needed for mild constipation. 03/07/18  Yes [name], MD  venlafaxine (EFFEXOR) 37.5 MG tablet Place 1 tablet (37.5 mg total) into feeding tube 2 (two) times daily with a meal. 03/01/18  Yes Sudini, Alveta Heimlich, MD  Water For Irrigation, Sterile (FREE WATER) SOLN Place 50 mLs into feeding tube every 4 (four) hours. 03/07/18  Yes [name], MD  Nutritional Supplements (FEEDING SUPPLEMENT, OSMOLITE 1.5 CAL,) LIQD Place 1,000 mLs into feeding tube daily. At 50 cc/hr Patient not taking: Reported on 03/10/2018 03/08/18   [name], MD  tamsulosin (FLOMAX) 0.4 MG CAPS capsule Take 1 capsule (0.4 mg total) by mouth daily. Patient not taking: Reported on 03/10/2018 03/08/18   [name], MD   No Known Allergies Review of Systems  Constitutional:       States he feel bad.     Physical Exam  Constitutional: No distress.  Pulmonary/Chest: Effort normal.  O2  Neurological: He is alert.    Vital Signs: BP 128/70 (BP Location: Right Arm)   Pulse 60   Temp (!) 97.5 F (36.4  C) (Oral)   Resp 20   Ht 5' 7"  (1.702 m)   Wt 54.6 kg (120 lb 5.9 oz)   SpO2 100%   BMI 18.85 kg/m  Pain Scale: 0-10   Pain Score: 0-No pain   SpO2: SpO2: 100 % O2 Device:SpO2: 100 % O2 Flow Rate: .O2 Flow Rate (L/min): 3 L/min  IO: Intake/output summary:   Intake/Output Summary (Last 24 hours) at 03/11/2018 1310 Last data filed at 03/11/2018 1224 Gross per 24 hour  Intake -  Output 1450 ml  Net -1450 ml    LBM: Last BM Date: 03/11/18 Baseline Weight: Weight: 58.7 kg (129 lb 6.4 oz) Most recent weight: Weight: 54.6 kg (120 lb 5.9 oz)     Palliative Assessment/Data:  NPO     Time In: 1:00 Time Out: 1:40 Time Total: 40 min Greater than 50%  of this time was spent counseling and coordinating care related to the above assessment and plan.  Signed by: Asencion Gowda, NP   Please contact Palliative Medicine Team phone at [phone] for questions and concerns.  For individual provider: See Shea Evans
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
## 2018-03-11 NOTE — Progress Notes (Signed)
While rounding Chaplain was referred by Care management. Chaplain entered  darken room. Pt was resistant and seem to be disengaged. Chaplain told Pt that he ws rounding and wanted to see if the Pt wanted to talk. Pt said he had no desire to talk. Chaplain asked if he would like prayer. Pt agreed and said we could pray for his health and family. Chaplain will continue to follow.    03/11/18 1400  Clinical Encounter Type  Visited With Patient  Visit Type Initial  Referral From Care management  Spiritual Encounters  Spiritual Needs Prayer

## 2018-03-11 NOTE — Progress Notes (Signed)
Pharmacy Electrolyte Monitoring Consult:  Pharmacy consulted to assist in monitoring and replacing electrolytes in this 67 y.o. male admitted on 03/10/2018 with abnormal lab values confusion, chronic hyponatremia, recurrent aspiration PNA  Labs:  Sodium (mmol/L)  Date Value  03/11/2018 124 (L)  02/22/2013 137   Potassium (mmol/L)  Date Value  03/11/2018 4.2  02/22/2013 4.6   Magnesium (mg/dL)  Date Value  03/10/2018 1.6 (L)  02/22/2013 2.1   Phosphorus (mg/dL)  Date Value  03/10/2018 2.8   Calcium (mg/dL)  Date Value  03/11/2018 8.0 (L)   Calcium, Total (mg/dL)  Date Value  02/22/2013 8.9   Albumin (g/dL)  Date Value  03/10/2018 2.5 (L)  02/19/2013 3.6    Assessment/Plan: K 4.2  Mag 1.6 (8/4), Scr 0.70  Na 124 Will order Magnesium sulfate 2 gram IV x 1. F/u with am labs. Nephrology following also. Scarlet Abad A 03/11/2018 2:11 PM                           e

## 2018-03-11 NOTE — Progress Notes (Signed)
PT Cancellation Note  Patient Details Name: Thomas [name] MRN: [id number] DOB: [birth year]   Cancelled Treatment:    Reason Eval/Treat Not Completed: Patient declined, no reason specified(Pt reports excessively queasy/nauseated to tolerate basic mobility at this time. Will attempt again at later date/time. )  4:18 PM, 03/11/18 Etta Grandchild, PT, DPT Physical Therapist - [facility]  [phone] ([place])    Clementine Soulliere C 03/11/2018, 4:18 PM
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
## 2018-03-11 NOTE — Progress Notes (Signed)
Please note, patient was to be followed by Palliative at [facility]. He was discharged on 8/2 and readmitted 8/4. CSW [employer] aware. Palliative team updated to readmission. Flo Shanks RN, BSN, [place] [facility] [place], hospital Liaison [phone]

## 2018-03-11 NOTE — Clinical Social Work Note (Signed)
Clinical Social Work Assessment  Patient Details  Name: Thomas [name] MRN: [id number] Date of Birth: 18-Oct-1950  Date of referral:  03/11/18               Reason for consult:  Facility Placement                Permission sought to share information with:  Case Manager, Customer service manager, Family Supports Permission granted to share information::  Yes, Verbal Permission Granted  Name::        Agency::     Relationship::     Contact Information:     Housing/Transportation Living arrangements for the past 2 months:  [place], [place] of Information:  Patient Patient Interpreter Needed:  None Criminal Activity/Legal Involvement Pertinent to Current Situation/Hospitalization:  No - Comment as needed Significant Relationships:  Adult Children, Other Family Members Lives with:  Facility Resident Do you feel safe going back to the place where you live?  Yes Need for family participation in patient care:  Yes (Comment)  Care giving concerns:  Patient is from [place] Worker assessment / plan:  CSW consulted for facility placement. CSW met with patient to discuss discharge planning. Patient states that he was living with his daughter prior to last admission to [facility] and from there he was taken to [facility] for rehab. Patient states that he would like to return to [facility] since he was only there for one day. CSW will follow for discharge planning   Employment status:  Disabled (Comment on whether or not currently receiving Disability) Insurance information:  Medicare PT Recommendations:  Not assessed at this time Information / Referral to community resources:     Patient/Family's Response to care:  Patient thanked CSW   Patient/Family's Understanding of and Emotional Response to Diagnosis, Current Treatment, and Prognosis:  Patient is in agreement to discharge plan   Emotional Assessment Appearance:  Appears stated  age Attitude/Demeanor/Rapport:    Affect (typically observed):  Quiet, Adaptable, Appropriate Orientation:  Oriented to Self, Oriented to Place, Oriented to  Time Alcohol / Substance use:  Not Applicable Psych involvement (Current and /or in the community):  No (Comment)  Discharge Needs  Concerns to be addressed:  Discharge Planning Concerns Readmission within the last 30 days:  Yes Current discharge risk:  None Barriers to Discharge:  Continued Medical Work up   [employer], [place] 03/11/2018, 2:35 PM

## 2018-03-11 NOTE — Progress Notes (Signed)
SLP Cancellation Note  Patient Details Name: Thomas [name] MRN: [id number] DOB: 04-15-1951   Cancelled treatment:       Reason Eval/Treat Not Completed: (chart reviewed; noted pt's baseline dx'd dysphagia; NPO). See multiple notes and MBSS from last admission(few days ago) indicating pt's severe pharyngeal phase dysphagia and risk for aspiration thus impacting his Pulmonary status. Pt received a PEG placement last admission; recommended to f/u w/ potential dysphagia therapy at Rehab once organized for his discharge.  MD consulted re: above and agreed w/ continued use of PEG for nutrition/hydration [name]; Rehab at discharge. Recommended frequent oral care for hygiene and stimulation of swallowing; aspiration precautions during oral care. NSG updated.    Orinda Kenner, MS, CCC-SLP Eyanna Mcgonagle 03/11/2018, 11:29 AM

## 2018-03-11 NOTE — Progress Notes (Signed)
Initial Nutrition Assessment  DOCUMENTATION CODES:   Severe malnutrition in context of chronic illness  INTERVENTION:  Continue Osmolite 1.5 at 50 mL/hr via G-tube. Provides 1800 kcal, 75 grams of protein, 912 mL H2O daily.  Provide minimal free water flush of 20 mL Q4hrs to maintain tube patency. Provides a total of 1032 mL H2O including water in tube feeds.  Provide Ocuvite daily per tube to promote wound healing.  NUTRITION DIAGNOSIS:   Severe Malnutrition related to chronic illness(COPD, CKD III, EtOH abuse, recurrent SCC of head and neck) as evidenced by severe fat depletion, severe muscle depletion.  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   Met with patient at bedside. He is confused and unable to answer any questions. He was previously receiving Osmolite 1.5 at 50 mL/hr and free water flush of 50 mL Q4hrs. Patient is NPO due to aspiration and receives all of his nutrition via G-tube.  Enteral Access: 16 Fr. Kangaroo G-tube with 20 cc balloon placed laparoscopically by surgeon on 7/24  TF: pt tolerating Osmolite 1.5 at 50 mL/hr  Medications reviewed and include: Augmentin, fentanyl patch, free water flush 50 mL Q4hrs, levothyroxine, Flomax,  NS @ 75 mL/hr, magnesium sulfate 2 grams IV once today.  Labs reviewed: Sodium 124, Chloride 91.  I/O: 1450 mL UOP overnight (2.2 mL/kg/hr)  Discussed with RN. Patient had some nausea and vomitted bloody sputum this AM but has been given Zofran and tolerating tube feeds since. Discussed with Nephrologist. Plan is for minimal FWF at this time.  NUTRITION - FOCUSED PHYSICAL EXAM:   Most Recent Value  Orbital Region  Severe depletion  Upper Arm Region  Severe depletion  Thoracic and Lumbar Region  Severe depletion  Buccal Region  Severe depletion  Temple Region  Severe depletion  Clavicle Bone Region  Severe depletion  Clavicle and Acromion Bone Region  Severe depletion  Scapular Bone Region  Severe depletion  Dorsal Hand  Severe depletion  Patellar Region  Severe depletion  Anterior Thigh Region  Severe depletion  Posterior Calf Region  Severe depletion  Edema (RD Assessment)  None  Hair  Reviewed  Eyes  Unable to assess  Mouth  Unable to assess  Skin  Reviewed  Nails  Reviewed      Diet Order:   Diet Order    None      EDUCATION NEEDS:   Not appropriate for education at this time  Skin:  Skin Assessment: Skin Integrity Issues: Skin Integrity Issues:: Stage II, Stage I Stage I: back Stage II: buttocks  Last BM:  03/11/2018 - medium type 6  Height:   Ht Readings from Last 1 Encounters:  03/10/18 _0  (1.702 m)    Weight:   Wt  Readings from Last 1 Encounters:  03/10/18 120 lb 5.9 oz (54.6 kg)    Ideal Body Weight:  67.3 kg  BMI:  Body mass index is 18.85 kg/m.  Estimated Nutritional Needs:   Kcal:  1670-1930 (MSJx 1.3-1.5)  Protein:  75-90 grams (1.5-1.8 grams/kg)  Fluid:  Per MD in setting of hyponatremia and SIADH  Willey Blade, MS, RD, LDN Office: [phone] Pager: [phone] After Hours/Weekend Pager: [phone]

## 2018-03-11 NOTE — Progress Notes (Addendum)
Patient c/o back pain and headache. Gave PRN tylenol and sent message to hospitalist concerning no additional pain medication available PRN in [facility]  See new order per Bridgett Larsson, MD

## 2018-03-11 NOTE — Progress Notes (Signed)
Anticoagulation monitoring(Lovenox):  66yo  M ordered Lovenox 40 mg Q24h  Filed Weights   03/10/18 1428 03/10/18 1723  Weight: 129 lb 6.4 oz (58.7 kg) 120 lb 5.9 oz (54.6 kg)   BMI 18.85   Lab Results  Component Value Date   CREATININE 0.70 03/11/2018   CREATININE 0.67 03/10/2018   CREATININE 0.67 03/10/2018   Estimated Creatinine Clearance: 69.2 mL/min (by C-G formula based on SCr of 0.7 mg/dL). Hemoglobin & Hematocrit     Component Value Date/Time   HGB 11.2 (L) 03/10/2018 1430   HGB 13.4 02/20/2013 0437   HCT 32.3 (L) 03/10/2018 1430   HCT 38.7 (L) 02/20/2013 9292     Per Protocol for Patient with estCrcl > 30 ml/min and weight < 57 kg in male patient, will transition to Lovenox 30 mg Q24h  Chinita Greenland PharmD Clinical Pharmacist 03/11/2018

## 2018-03-11 NOTE — NC FL2 (Signed)
[place] LEVEL OF CARE SCREENING TOOL     IDENTIFICATION  Patient Name: Thomas [name] Birthdate: 1950-12-20 Sex: male Admission Date (Current Location): 03/10/2018  [place] and [place] Number:  Engineering geologist and Address:  [facility], [address], [place], [place] [address]      Provider Number: [id number]  Attending Physician Name and Address:  Bettey Costa, MD  Relative Name and Phone Number:       Current Level of Care: Hospital Recommended Level of Care: [place] Prior Approval Number:    Date Approved/Denied:   PASRR Number:    Discharge Plan: SNF    Current Diagnoses: Patient Active Problem List   Diagnosis Date Noted  . Encephalopathy acute 03/10/2018  . Pressure injury of skin 03/10/2018  . Dysphagia   . Neck mass   . Head and neck cancer ([place]) 02/19/2018  . Cervical lymphadenopathy   . Poor appetite   . Loss of weight   . History of tongue cancer   . Protein-calorie malnutrition, severe 01/22/2016  . Alcohol use disorder, severe, dependence ([place]) 12/12/2014  . Hyponatremia 12/12/2014  . Essential hypertension 12/12/2014  . COPD (chronic obstructive pulmonary disease) ([place]) 12/12/2014    Orientation RESPIRATION BLADDER Height & Weight     Self, Time, Place  O2(3 liters ) External catheter Weight: 120 lb 5.9 oz (54.6 kg) Height:  5\' 7"  (170.2 cm)  BEHAVIORAL SYMPTOMS/MOOD NEUROLOGICAL BOWEL NUTRITION STATUS  (none) (none) Incontinent Feeding tube(Osmolite 1.5 at 50 mL/hr via G-tube. Provides 1800 kcal, 75 grams of protein, 912 mL H2O daily)  AMBULATORY STATUS COMMUNICATION OF NEEDS Skin   Extensive Assist Verbally Normal                       Personal Care Assistance Level of Assistance  Bathing, Feeding, Dressing Bathing Assistance: Limited assistance Feeding assistance: Limited assistance Dressing Assistance: Limited assistance     Functional Limitations Info  Sight,  Hearing, Speech Sight Info: Adequate Hearing Info: Adequate Speech Info: Adequate    SPECIAL CARE FACTORS FREQUENCY  PT (By licensed PT), OT (By licensed OT)     PT Frequency: 5 OT Frequency: 5            Contractures Contractures Info: Not present    Additional Factors Info  Code Status, Allergies Code Status Info: Full Code  Allergies Info: NKA     Isolation Precautions Info: Contact Precautions: MRSA      Current Medications (03/11/2018):  This is the current hospital active medication list Current Facility-Administered Medications  Medication Dose Route Frequency Provider Last Rate Last Dose  . 0.9 %  sodium chloride infusion  250 mL Intravenous PRN Salary, Montell D, MD      . 0.9 %  sodium chloride infusion   Intravenous Continuous Bettey Costa, MD 75 mL/hr at 03/11/18 1038    . acetaminophen (TYLENOL) tablet 650 mg  650 mg Per Tube Q6H PRN Salary, Montell D, MD       Or  . acetaminophen (TYLENOL) suppository 650 mg  650 mg Rectal Q6H PRN Salary, Montell D, MD      . amLODipine (NORVASC) tablet 2.5 mg  2.5 mg Per Tube Daily Salary, Montell D, MD      . amoxicillin-clavulanate (AUGMENTIN) 400-57 MG/5ML suspension 800 mg  800 mg Per Tube Q12H Salary, Montell D, MD   800 mg at 03/11/18 1040  . [START ON 03/16/2018] cloNIDine (CATAPRES -  Dosed in mg/24 hr) patch 0.1 mg  0.1 mg Transdermal Weekly Salary, Montell D, MD      . docusate (COLACE) 50 MG/5ML liquid 100 mg  100 mg Per Tube Daily PRN Salary, Montell D, MD      . enoxaparin (LOVENOX) injection 30 mg  30 mg Subcutaneous Q24H Mody, Sital, MD      . feeding supplement (OSMOLITE 1.5 CAL) liquid 1,000 mL  1,000 mL Per Tube Continuous Bettey Costa, MD      . Derrill Memo ON 03/12/2018] fentaNYL (DURAGESIC - dosed mcg/hr) 12.5 mcg  12.5 mcg Transdermal Q72H Salary, Montell D, MD      . fluticasone (FLONASE) 50 MCG/ACT nasal spray 1 spray  1 spray Each Nare Daily Salary, Montell D, MD      . free water 20 mL  20 mL Per Tube Q4H Mody,  Sital, MD   20 mL at 03/11/18 1408  . guaiFENesin (MUCINEX) 12 hr tablet 600 mg  600 mg Oral BID Salary, Montell D, MD   600 mg at 03/11/18 1049  . hydrALAZINE (APRESOLINE) injection 10 mg  10 mg Intravenous Q4H PRN Salary, Montell D, MD      . ipratropium-albuterol (DUONEB) 0.5-2.5 (3) MG/3ML nebulizer solution 3 mL  3 mL Nebulization Q6H Salary, Montell D, MD   3 mL at 03/10/18 1944  . levothyroxine (SYNTHROID, LEVOTHROID) tablet 50 mcg  50 mcg Per Tube QAC breakfast Salary, Montell D, MD   50 mcg at 03/11/18 1048  . magnesium sulfate IVPB 2 g 50 mL  2 g Intravenous Once Mody, Sital, MD      . metoprolol tartrate (LOPRESSOR) tablet 100 mg  100 mg Per Tube BID Salary, Montell D, MD   100 mg at 03/11/18 1049  . mometasone-formoterol (DULERA) 100-5 MCG/ACT inhaler 2 puff  2 puff Inhalation BID Loney Hering D, MD   2 puff at 03/10/18 2150  . [START ON 03/12/2018] multivitamin-lutein (OCUVITE-LUTEIN) capsule 1 capsule  1 capsule Per Tube Daily Mody, Sital, MD      . ondansetron (ZOFRAN) tablet 4 mg  4 mg Oral Q6H PRN Salary, Montell D, MD       Or  . ondansetron (ZOFRAN) injection 4 mg  4 mg Intravenous Q6H PRN Salary, Montell D, MD   4 mg at 03/11/18 1040  . polyethylene glycol (MIRALAX / GLYCOLAX) packet 17 g  17 g Oral Daily PRN Salary, Montell D, MD      . sennosides (SENOKOT) 8.8 MG/5ML syrup 5 mL  5 mL Per Tube Daily PRN Salary, Montell D, MD      . sodium chloride flush (NS) 0.9 % injection 3 mL  3 mL Intravenous Q12H Salary, Montell D, MD   3 mL at 03/11/18 1054  . sodium chloride flush (NS) 0.9 % injection 3 mL  3 mL Intravenous PRN Salary, Montell D, MD      . sodium chloride tablet 2 g  2 g Per Tube TID Salary, Montell D, MD   2 g at 03/11/18 1042  . tamsulosin (FLOMAX) capsule 0.4 mg  0.4 mg Oral Daily Salary, Montell D, MD   0.4 mg at 03/11/18 1048  . venlafaxine (EFFEXOR) tablet 37.5 mg  37.5 mg Per Tube BID WC Salary, Montell D, MD   37.5 mg at 03/11/18 1048     Discharge  Medications: Please see discharge summary for a list of discharge medications.  Relevant Imaging Results:  Relevant Lab Results:   Additional Information  Annamaria Boots, LCSWA

## 2018-03-12 ENCOUNTER — Other Ambulatory Visit: Payer: Self-pay | Admitting: Oncology

## 2018-03-12 ENCOUNTER — Inpatient Hospital Stay: Payer: Medicare Other | Admitting: Oncology

## 2018-03-12 DIAGNOSIS — C760 Malignant neoplasm of head, face and neck: Secondary | ICD-10-CM

## 2018-03-12 LAB — BASIC METABOLIC PANEL
ANION GAP: 4 — AB (ref 5–15)
BUN: 17 mg/dL (ref 8–23)
CALCIUM: 7.8 mg/dL — AB (ref 8.9–10.3)
CO2: 29 mmol/L (ref 22–32)
Chloride: 97 mmol/L — ABNORMAL LOW (ref 98–111)
Creatinine, Ser: 0.83 mg/dL (ref 0.61–1.24)
GFR calc non Af Amer: >60 mL/min (ref >60)
Glucose, Bld: 123 mg/dL — ABNORMAL HIGH (ref 70–99)
Potassium: 4.4 mmol/L (ref 3.5–5.1)
Sodium: 130 mmol/L — ABNORMAL LOW (ref 135–145)

## 2018-03-12 LAB — RPR: RPR: NONREACTIVE

## 2018-03-12 LAB — SODIUM: SODIUM: 130 mmol/L — AB (ref 135–145)

## 2018-03-12 LAB — MAGNESIUM: MAGNESIUM: 2.1 mg/dL (ref 1.7–2.4)

## 2018-03-12 MED ORDER — AMOXICILLIN-POT CLAVULANATE 400-57 MG/5ML PO SUSR: 800.0000 mg | Freq: Two times a day (BID) | ORAL | 0 refills | Status: AC

## 2018-03-12 MED ORDER — FREE WATER: 20.0000 mL | 0 refills | Status: AC

## 2018-03-12 MED ORDER — GUAIFENESIN 100 MG/5ML PO SOLN
15.0000 mL | Freq: Four times a day (QID) | ORAL | Status: DC
  Administered 2018-03-12: 300 mg
  Filled 2018-03-12 (×4): qty 15

## 2018-03-12 MED ORDER — TAMSULOSIN HCL 0.4 MG PO CAPS: 0.4000 mg | ORAL_CAPSULE | Freq: Every day | ORAL | 1 refills | Status: DC

## 2018-03-12 NOTE — Plan of Care (Signed)
  Problem: Education: Goal: Knowledge of General Education information will improve Description Including pain rating scale, medication(s)/side effects and non-pharmacologic comfort measures Outcome: Progressing   Problem: Clinical Measurements: Goal: Cardiovascular complication will be avoided Outcome: Progressing   Problem: Nutrition: Goal: Adequate nutrition will be maintained Outcome: Progressing   Problem: Elimination: Goal: Will not experience complications related to bowel motility Outcome: Progressing Goal: Will not experience complications related to urinary retention Outcome: Progressing   Problem: Pain Managment: Goal: General experience of comfort will improve Outcome: Progressing   Problem: Safety: Goal: Ability to remain free from injury will improve Outcome: Progressing
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
## 2018-03-12 NOTE — Progress Notes (Addendum)
Pharmacy Electrolyte Monitoring Consult:  Pharmacy consulted to assist in monitoring and replacing electrolytes in this 67 y.o. male admitted on 03/10/2018 with abnormal lab values confusion, chronic hyponatremia, recurrent aspiration PNA  Labs:  Sodium (mmol/L)  Date Value  03/12/2018 130 (L)  02/22/2013 137   Potassium (mmol/L)  Date Value  03/12/2018 4.4  02/22/2013 4.6   Magnesium (mg/dL)  Date Value  03/12/2018 2.1  02/22/2013 2.1   Phosphorus (mg/dL)  Date Value  03/10/2018 2.8   Calcium (mg/dL)  Date Value  03/12/2018 7.8 (L)   Calcium, Total (mg/dL)  Date Value  02/22/2013 8.9   Albumin (g/dL)  Date Value  03/10/2018 2.5 (L)  02/19/2013 3.6    Assessment/Plan: Na 130, K 4.4, Ca 7.8, adjusted Ca 9, Mg 2.1, phos not assessed. No further supplement required at this time. Nephrology is following for hyponatremia consistent with hypovolemia (likely d/t NV). He is on IVF and salt tablets. Levothyroxine for hypothyrodism.   Of his PTA medications that could be associated with SIADH venlafaxine is the most likely agent. If patient is not improving on the above regimen consider holding venlafaxine.   Laural Benes, Pharm.D., BCPS Clinical Pharmacist 03/12/2018 7:10 AM                           e

## 2018-03-12 NOTE — Progress Notes (Signed)
PET scan

## 2018-03-12 NOTE — Clinical Social Work Note (Signed)
Patient is medically ready for discharge today. CSW notified patient and daughter Langley Gauss [phone] of discharge today. CSW also notified Neoma Laming at [facility] of patient discharge today. Patient will be transported by EMS. RN to call report and call for EMS.   [place], [place]

## 2018-03-12 NOTE — Plan of Care (Signed)
Pt is d/ced back to [facility].  Called report to [place].  He's going back to [facility].  Came in for increased confusion and hyponatremia.  Confusion resolved.  Na level improved to 130 from 124.  Pt tolerating PEG tube feeds at 50 ml/hr.  Free water has been reduced to 20 ml/hr q4hrs.  IV removed by nurse tech.  EMS called for transport.
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
## 2018-03-18 ENCOUNTER — Ambulatory Visit
Admission: RE | Admit: 2018-03-18 | Discharge: 2018-03-18 | Disposition: A | Discharge status: Home / Self Care | Payer: No Typology Code available for payment source | Source: Ambulatory Visit | Attending: Oncology | Admitting: Oncology

## 2018-03-18 DIAGNOSIS — J0100 Acute maxillary sinusitis, unspecified: Secondary | ICD-10-CM | POA: Insufficient documentation

## 2018-03-18 DIAGNOSIS — R918 Other nonspecific abnormal finding of lung field: Secondary | ICD-10-CM | POA: Insufficient documentation

## 2018-03-18 DIAGNOSIS — J439 Emphysema, unspecified: Secondary | ICD-10-CM | POA: Diagnosis not present

## 2018-03-18 DIAGNOSIS — R590 Localized enlarged lymph nodes: Secondary | ICD-10-CM | POA: Diagnosis not present

## 2018-03-18 DIAGNOSIS — R911 Solitary pulmonary nodule: Secondary | ICD-10-CM | POA: Diagnosis not present

## 2018-03-18 DIAGNOSIS — I700 Atherosclerosis of aorta: Secondary | ICD-10-CM | POA: Insufficient documentation

## 2018-03-18 DIAGNOSIS — N200 Calculus of kidney: Secondary | ICD-10-CM | POA: Insufficient documentation

## 2018-03-18 DIAGNOSIS — C760 Malignant neoplasm of head, face and neck: Secondary | ICD-10-CM | POA: Insufficient documentation

## 2018-03-18 DIAGNOSIS — I2510 Atherosclerotic heart disease of native coronary artery without angina pectoris: Secondary | ICD-10-CM | POA: Insufficient documentation

## 2018-03-18 LAB — GLUCOSE, CAPILLARY: Glucose-Capillary: 97 mg/dL (ref 70–99)

## 2018-03-18 MED ORDER — FLUDEOXYGLUCOSE F - 18 (FDG) INJECTION
6.4100 | Freq: Once | INTRAVENOUS | Status: AC | PRN
  Administered 2018-03-18: 6.41 via INTRAVENOUS

## 2018-03-20 ENCOUNTER — Other Ambulatory Visit: Payer: Self-pay

## 2018-03-20 ENCOUNTER — Inpatient Hospital Stay: Payer: Medicare Other | Attending: Oncology | Admitting: Oncology

## 2018-03-20 ENCOUNTER — Inpatient Hospital Stay: Payer: Medicare Other

## 2018-03-20 ENCOUNTER — Encounter: Payer: Self-pay | Admitting: Oncology

## 2018-03-20 VITALS — BP 97/67 | HR 99 | Temp 97.7°F | Resp 18 | Ht 67.0 in | Wt 99.8 lb

## 2018-03-20 DIAGNOSIS — E43 Unspecified severe protein-calorie malnutrition: Secondary | ICD-10-CM | POA: Insufficient documentation

## 2018-03-20 DIAGNOSIS — C760 Malignant neoplasm of head, face and neck: Secondary | ICD-10-CM

## 2018-03-20 DIAGNOSIS — E032 Hypothyroidism due to medicaments and other exogenous substances: Secondary | ICD-10-CM

## 2018-03-20 DIAGNOSIS — E890 Postprocedural hypothyroidism: Secondary | ICD-10-CM | POA: Diagnosis not present

## 2018-03-20 DIAGNOSIS — C109 Malignant neoplasm of oropharynx, unspecified: Secondary | ICD-10-CM | POA: Insufficient documentation

## 2018-03-20 DIAGNOSIS — R918 Other nonspecific abnormal finding of lung field: Secondary | ICD-10-CM | POA: Insufficient documentation

## 2018-03-20 DIAGNOSIS — N200 Calculus of kidney: Secondary | ICD-10-CM | POA: Diagnosis not present

## 2018-03-20 DIAGNOSIS — F17210 Nicotine dependence, cigarettes, uncomplicated: Secondary | ICD-10-CM | POA: Diagnosis not present

## 2018-03-20 DIAGNOSIS — Z931 Gastrostomy status: Secondary | ICD-10-CM

## 2018-03-20 DIAGNOSIS — Z5112 Encounter for antineoplastic immunotherapy: Secondary | ICD-10-CM | POA: Diagnosis not present

## 2018-03-20 DIAGNOSIS — R7989 Other specified abnormal findings of blood chemistry: Secondary | ICD-10-CM | POA: Diagnosis not present

## 2018-03-20 DIAGNOSIS — E871 Hypo-osmolality and hyponatremia: Secondary | ICD-10-CM | POA: Insufficient documentation

## 2018-03-20 DIAGNOSIS — D649 Anemia, unspecified: Secondary | ICD-10-CM | POA: Diagnosis not present

## 2018-03-20 DIAGNOSIS — Z7189 Other specified counseling: Secondary | ICD-10-CM

## 2018-03-20 DIAGNOSIS — E039 Hypothyroidism, unspecified: Secondary | ICD-10-CM | POA: Insufficient documentation

## 2018-03-20 LAB — CBC WITH DIFFERENTIAL/PLATELET
Basophils Absolute: 0.1 10*3/uL (ref 0–0.1)
Basophils Relative: 1 %
EOS PCT: 1 %
Eosinophils Absolute: 0.1 10*3/uL (ref 0–0.7)
HCT: 27.1 % — ABNORMAL LOW (ref 40.0–52.0)
Hemoglobin: 9 g/dL — ABNORMAL LOW (ref 13.0–18.0)
LYMPHS ABS: 0.8 10*3/uL — AB (ref 1.0–3.6)
LYMPHS PCT: 8 %
MCH: 29.2 pg (ref 26.0–34.0)
MCHC: 33.4 g/dL (ref 32.0–36.0)
MCV: 87.4 fL (ref 80.0–100.0)
MONOS PCT: 12 %
Monocytes Absolute: 1.2 10*3/uL — ABNORMAL HIGH (ref 0.2–1.0)
Neutro Abs: 8.3 10*3/uL — ABNORMAL HIGH (ref 1.4–6.5)
Neutrophils Relative %: 78 %
PLATELETS: 500 10*3/uL — AB (ref 150–440)
RBC: 3.1 MIL/uL — ABNORMAL LOW (ref 4.40–5.90)
RDW: 17.3 % — ABNORMAL HIGH (ref 11.5–14.5)
WBC: 10.6 10*3/uL (ref 3.8–10.6)

## 2018-03-20 LAB — COMPREHENSIVE METABOLIC PANEL
ALBUMIN: 3 g/dL — AB (ref 3.5–5.0)
ALT: 31 U/L (ref 0–44)
AST: 24 U/L (ref 15–41)
Alkaline Phosphatase: 115 U/L (ref 38–126)
Anion gap: 13 (ref 5–15)
BUN: 29 mg/dL — AB (ref 8–23)
CHLORIDE: 88 mmol/L — AB (ref 98–111)
CO2: 27 mmol/L (ref 22–32)
Calcium: 9 mg/dL (ref 8.9–10.3)
Creatinine, Ser: 0.97 mg/dL (ref 0.61–1.24)
GFR calc Af Amer: >60 mL/min (ref >60)
GFR calc non Af Amer: >60 mL/min (ref >60)
Glucose, Bld: 115 mg/dL — ABNORMAL HIGH (ref 70–99)
POTASSIUM: 4.8 mmol/L (ref 3.5–5.1)
Sodium: 128 mmol/L — ABNORMAL LOW (ref 135–145)
Total Bilirubin: 0.5 mg/dL (ref 0.3–1.2)
Total Protein: 7.3 g/dL (ref 6.5–8.1)

## 2018-03-20 LAB — FERRITIN: FERRITIN: 537 ng/mL — AB (ref 24–336)

## 2018-03-20 LAB — FOLATE: Folate: 21.4 ng/mL (ref >5.9)

## 2018-03-20 LAB — IRON AND TIBC
Iron: 21 ug/dL — ABNORMAL LOW (ref 45–182)
Saturation Ratios: 7 % — ABNORMAL LOW (ref 17.9–39.5)
TIBC: 308 ug/dL (ref 250–450)
UIBC: 287 ug/dL

## 2018-03-20 LAB — VITAMIN B12: Vitamin B-12: 414 pg/mL (ref 180–914)

## 2018-03-20 LAB — TSH: TSH: 91 u[IU]/mL — AB (ref 0.350–4.500)

## 2018-03-20 MED ORDER — LEVOTHYROXINE SODIUM 50 MCG PO TABS: 75.0000 ug | ORAL_TABLET | Freq: Every day | ORAL | 2 refills | Status: DC

## 2018-03-20 MED ORDER — SODIUM CHLORIDE 1 G PO TABS: 1.0000 g | ORAL_TABLET | Freq: Three times a day (TID) | ORAL | 0 refills | Status: DC

## 2018-03-20 MED ORDER — ONDANSETRON HCL 8 MG PO TABS
8.0000 mg | ORAL_TABLET | Freq: Two times a day (BID) | ORAL | 1 refills | Status: DC | PRN
Start: 2018-03-20 — End: 2018-03-21

## 2018-03-20 MED ORDER — PROCHLORPERAZINE MALEATE 10 MG PO TABS: 10.0000 mg | ORAL_TABLET | Freq: Four times a day (QID) | ORAL | 1 refills | Status: DC | PRN
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
## 2018-03-20 NOTE — Patient Instructions (Signed)
Pembrolizumab injection  What is this medicine?  PEMBROLIZUMAB (pem broe liz ue mab) is a monoclonal antibody. It is used to treat melanoma, head and neck cancer, Hodgkin lymphoma, non-small cell lung cancer, urothelial cancer, stomach cancer, and cancers that have a certain genetic condition.  This medicine may be used for other purposes; ask your health care provider or pharmacist if you have questions.  COMMON BRAND NAME(S): Keytruda  What should I tell my health care provider before I take this medicine?  They need to know if you have any of these conditions:  -diabetes  -immune system problems  -inflammatory bowel disease  -liver disease  -lung or breathing disease  -lupus  -organ transplant  -an unusual or allergic reaction to pembrolizumab, other medicines, foods, dyes, or preservatives  -pregnant or trying to get pregnant  -breast-feeding  How should I use this medicine?  This medicine is for infusion into a vein. It is given by a health care professional in a hospital or clinic setting.  A special MedGuide will be given to you before each treatment. Be sure to read this information carefully each time.  Talk to your pediatrician regarding the use of this medicine in children. While this drug may be prescribed for selected conditions, precautions do apply.  Overdosage: If you think you have taken too much of this medicine contact a poison control center or emergency room at once.  NOTE: This medicine is only for you. Do not share this medicine with others.  What if I miss a dose?  It is important not to miss your dose. Call your doctor or health care professional if you are unable to keep an appointment.  What may interact with this medicine?  Interactions have not been studied.  Give your health care provider a list of all the medicines, herbs, non-prescription drugs, or dietary supplements you use. Also tell them if you smoke, drink alcohol, or use illegal drugs. Some items may interact with your  medicine.  This list may not describe all possible interactions. Give your health care provider a list of all the medicines, herbs, non-prescription drugs, or dietary supplements you use. Also tell them if you smoke, drink alcohol, or use illegal drugs. Some items may interact with your medicine.  What should I watch for while using this medicine?  Your condition will be monitored carefully while you are receiving this medicine.  You may need blood work done while you are taking this medicine.  Do not become pregnant while taking this medicine or for 4 months after stopping it. Women should inform their doctor if they wish to become pregnant or think they might be pregnant. There is a potential for serious side effects to an unborn child. Talk to your health care professional or pharmacist for more information. Do not breast-feed an infant while taking this medicine or for 4 months after the last dose.  What side effects may I notice from receiving this medicine?  Side effects that you should report to your doctor or health care professional as soon as possible:  -allergic reactions like skin rash, itching or hives, swelling of the face, lips, or tongue  -bloody or black, tarry  -breathing problems  -changes in vision  -chest pain  -chills  -constipation  -cough  -dizziness or feeling faint or lightheaded  -fast or irregular heartbeat  -fever  -flushing  -hair loss  -low blood counts - this medicine may decrease the number of white blood cells, red blood cells   and platelets. You may be at increased risk for infections and bleeding.  -muscle pain  -muscle weakness  -persistent headache  -signs and symptoms of high blood sugar such as dizziness; dry mouth; dry skin; fruity breath; nausea; stomach pain; increased hunger or thirst; increased urination  -signs and symptoms of kidney injury like trouble passing urine or change in the amount of urine  -signs and symptoms of liver injury like dark urine, light-colored  stools, loss of appetite, nausea, right upper belly pain, yellowing of the eyes or skin  -stomach pain  -sweating  -weight loss  Side effects that usually do not require medical attention (report to your doctor or health care professional if they continue or are bothersome):  -decreased appetite  -diarrhea  -tiredness  This list may not describe all possible side effects. Call your doctor for medical advice about side effects. You may report side effects to FDA at 1-800-FDA-1088.  Where should I keep my medicine?  This drug is given in a hospital or clinic and will not be stored at home.  NOTE: This sheet is a summary. It may not cover all possible information. If you have questions about this medicine, talk to your doctor, pharmacist, or health care provider.   2018 Elsevier/Gold Standard (2016-05-02 12:29:36)

## 2018-03-20 NOTE — Progress Notes (Signed)
Patient here as a new patient. Pt able to understand and make self understood. PEG tube and foley catheter present. He is currently at [facility] in [place].Med reconciliation done through facility MAR. C/o pain to back of head to neck.

## 2018-03-20 NOTE — Progress Notes (Signed)
START ON PATHWAY REGIMEN - Head and Neck     A cycle is 21 days:     Pembrolizumab   **Always confirm dose/schedule in your pharmacy ordering system**  Patient Characteristics: Oropharynx, HPV Negative/Unknown, Metastatic, First Line Disease Classification: Oropharynx HPV Status: Did Not Order Test Current Disease Status: Metastatic Disease AJCC T Category: [place] AJCC 8 Stage Grouping: IVC AJCC N Category: pN2 AJCC M Category: M1 Line of therapy: First Line  Intent of Therapy: Non-Curative / Palliative Intent, Discussed with Patient

## 2018-03-21 ENCOUNTER — Other Ambulatory Visit: Payer: Self-pay | Admitting: *Deleted

## 2018-03-21 ENCOUNTER — Inpatient Hospital Stay: Payer: Medicare Other

## 2018-03-21 ENCOUNTER — Inpatient Hospital Stay: Payer: Self-pay | Admitting: Nurse Practitioner

## 2018-03-21 DIAGNOSIS — C760 Malignant neoplasm of head, face and neck: Secondary | ICD-10-CM

## 2018-03-21 MED ORDER — LEVOTHYROXINE SODIUM 75 MCG PO TABS
75.0000 ug | ORAL_TABLET | Freq: Every day | ORAL | 0 refills | Status: DC
Start: 2018-03-21 — End: 2018-04-02

## 2018-03-21 MED ORDER — PROCHLORPERAZINE MALEATE 10 MG PO TABS: 10.0000 mg | ORAL_TABLET | Freq: Four times a day (QID) | ORAL | 0 refills | Status: DC | PRN

## 2018-03-21 MED ORDER — ONDANSETRON HCL 8 MG PO TABS: 8.0000 mg | ORAL_TABLET | Freq: Two times a day (BID) | ORAL | 0 refills | Status: DC | PRN

## 2018-03-21 MED ORDER — SODIUM CHLORIDE 1 G PO TABS: 1.0000 g | ORAL_TABLET | Freq: Three times a day (TID) | ORAL | 0 refills | Status: DC

## 2018-03-21 NOTE — Progress Notes (Signed)
Nutrition Assessment   Reason for Assessment:   Referral from Dr. Tasia Catchings protein calorie malnutrition   ASSESSMENT:   67 year old male with recurrent metstatic head and neck cancer.  Patient followed by Dr. Tasia Catchings.  Planning to start [place] next week.  Past medical history reviewed.  Patient had PEG tube placed on 02/27/18 during hospital admission by surgeon laparoscopically (16 french Kangaroo G-tube with 20cc balloon)  Met with patient and daughter in clinic.  Patient is not allowed to take anything by mouth due to aspiration risk.  Noted SLP evaluated during recent hospital admission.  Daughter brought paperwork from [facility] regarding feeding regimen.  Patient receiving osmolite 1.5 at 34m/hr via pump over 24 hr.  Pedialyte 545mgiven q 4 hours vs water flush due to decreased sodium.  Daughter reports that patient is tolerating tube feeding well.    Nutrition Focused Physical Exam: Nutrition-Focused physical exam completed. Findings are  Moderate orbital, moderate buccal, severe arm, severe ribs, fat depletion, moderate temple, severe clavicle, severe shoulder, severe scapula, moderate hand, severe thigh, moderate calf,  muscle depletion, and none edema.    Medications: colace, Mag ox, zofan, compazine, senokot, Na chlorid 1 g   Labs: Na 128, glucose 115, 3.0   Anthropometrics:   Height: 67 inches Weight: 99 lb 12.8 oz UBW: 145-160 lb per daughter 3-4 years ago.  Noted 135 lb 11/23/16 BMI: 15  26% weight loss in the last year and 4 months   Estimated Energy Needs  Kcals: 1350-1575 calories/d Protein: 68-78 g/d Fluid: 1.5 L/d   NUTRITION DIAGNOSIS: Malnutrition related to cancer and cancer related side effects (aspiration) as evidenced by severe muscle mass and severe fat depletion   MALNUTRITION DIAGNOSIS: Patient meets criteria for severe malnutrition in chronic illness as evidenced by severe muscle mass loss and fat depletion   INTERVENTION:  Patient to continue  osmolite 1.5 at 5040mr for 24 hours with pedialyte 43m9m4 hours (due to decreased sodium).  [facility] and regimen.  Discussed upcoming immunotherapy regimen and concerned with patient being at cancer center for prolonged period of time without tube feeding.  RD at facility to make adjustments in regimen on infusion days.   Current regimen providing 1800 kcals, 75 g of protein and 912ml56me water.  300ml 63malyte daily  Contact information given to daughter and RD at facility.     MONITORING, EVALUATION, GOAL: weight trends, TF tolerance   Next Visit: to be determined  Dyer Klug B. Baylee Mccorkel,Zenia ResidesLDMiddlesexReGreendaletered Dietitian [phone])

## 2018-03-21 NOTE — Progress Notes (Signed)
Rx's printed and faxed to [place] at [facility] at [phone].

## 2018-03-26 ENCOUNTER — Inpatient Hospital Stay: Payer: Medicare Other

## 2018-03-26 ENCOUNTER — Inpatient Hospital Stay (HOSPITAL_BASED_OUTPATIENT_CLINIC_OR_DEPARTMENT_OTHER): Payer: Medicare Other | Admitting: Nurse Practitioner

## 2018-03-26 ENCOUNTER — Other Ambulatory Visit: Payer: Self-pay

## 2018-03-26 ENCOUNTER — Inpatient Hospital Stay (HOSPITAL_BASED_OUTPATIENT_CLINIC_OR_DEPARTMENT_OTHER): Payer: Medicare Other | Admitting: Oncology

## 2018-03-26 ENCOUNTER — Encounter: Payer: Self-pay | Admitting: Oncology

## 2018-03-26 VITALS — BP 160/89 | HR 108 | Temp 96.5°F | Resp 18 | Wt 103.1 lb

## 2018-03-26 DIAGNOSIS — C760 Malignant neoplasm of head, face and neck: Secondary | ICD-10-CM

## 2018-03-26 DIAGNOSIS — E43 Unspecified severe protein-calorie malnutrition: Secondary | ICD-10-CM | POA: Diagnosis not present

## 2018-03-26 DIAGNOSIS — D649 Anemia, unspecified: Secondary | ICD-10-CM | POA: Diagnosis not present

## 2018-03-26 DIAGNOSIS — E032 Hypothyroidism due to medicaments and other exogenous substances: Secondary | ICD-10-CM

## 2018-03-26 DIAGNOSIS — Z931 Gastrostomy status: Secondary | ICD-10-CM

## 2018-03-26 DIAGNOSIS — N200 Calculus of kidney: Secondary | ICD-10-CM

## 2018-03-26 DIAGNOSIS — R7989 Other specified abnormal findings of blood chemistry: Secondary | ICD-10-CM

## 2018-03-26 DIAGNOSIS — Z7189 Other specified counseling: Secondary | ICD-10-CM

## 2018-03-26 DIAGNOSIS — C109 Malignant neoplasm of oropharynx, unspecified: Secondary | ICD-10-CM | POA: Diagnosis not present

## 2018-03-26 DIAGNOSIS — E890 Postprocedural hypothyroidism: Secondary | ICD-10-CM | POA: Diagnosis not present

## 2018-03-26 DIAGNOSIS — E871 Hypo-osmolality and hyponatremia: Secondary | ICD-10-CM

## 2018-03-26 DIAGNOSIS — R918 Other nonspecific abnormal finding of lung field: Secondary | ICD-10-CM

## 2018-03-26 DIAGNOSIS — Z5112 Encounter for antineoplastic immunotherapy: Secondary | ICD-10-CM | POA: Diagnosis not present

## 2018-03-26 DIAGNOSIS — Z8639 Personal history of other endocrine, nutritional and metabolic disease: Secondary | ICD-10-CM

## 2018-03-26 LAB — CBC WITH DIFFERENTIAL/PLATELET
Basophils Absolute: 0.1 10*3/uL (ref 0–0.1)
Basophils Relative: 1 %
EOS ABS: 0.3 10*3/uL (ref 0–0.7)
Eosinophils Relative: 3 %
HCT: 27.4 % — ABNORMAL LOW (ref 40.0–52.0)
HEMOGLOBIN: 9.2 g/dL — AB (ref 13.0–18.0)
LYMPHS ABS: 0.9 10*3/uL — AB (ref 1.0–3.6)
Lymphocytes Relative: 8 %
MCH: 29.6 pg (ref 26.0–34.0)
MCHC: 33.5 g/dL (ref 32.0–36.0)
MCV: 88.6 fL (ref 80.0–100.0)
Monocytes Absolute: 1.3 10*3/uL — ABNORMAL HIGH (ref 0.2–1.0)
Monocytes Relative: 11 %
NEUTROS PCT: 77 %
Neutro Abs: 8.6 10*3/uL — ABNORMAL HIGH (ref 1.4–6.5)
Platelets: 464 10*3/uL — ABNORMAL HIGH (ref 150–440)
RBC: 3.1 MIL/uL — ABNORMAL LOW (ref 4.40–5.90)
RDW: 17 % — ABNORMAL HIGH (ref 11.5–14.5)
WBC: 11.2 10*3/uL — AB (ref 3.8–10.6)

## 2018-03-26 LAB — COMPREHENSIVE METABOLIC PANEL
ALT: 52 U/L — AB (ref 0–44)
AST: 51 U/L — ABNORMAL HIGH (ref 15–41)
Albumin: 3.1 g/dL — ABNORMAL LOW (ref 3.5–5.0)
Alkaline Phosphatase: 129 U/L — ABNORMAL HIGH (ref 38–126)
Anion gap: 13 (ref 5–15)
BUN: 25 mg/dL — ABNORMAL HIGH (ref 8–23)
CHLORIDE: 96 mmol/L — AB (ref 98–111)
CO2: 29 mmol/L (ref 22–32)
CREATININE: 0.85 mg/dL (ref 0.61–1.24)
Calcium: 9.7 mg/dL (ref 8.9–10.3)
GFR calc Af Amer: >60 mL/min (ref >60)
Glucose, Bld: 118 mg/dL — ABNORMAL HIGH (ref 70–99)
POTASSIUM: 5 mmol/L (ref 3.5–5.1)
Sodium: 138 mmol/L (ref 135–145)
Total Bilirubin: 0.2 mg/dL — ABNORMAL LOW (ref 0.3–1.2)
Total Protein: 8 g/dL (ref 6.5–8.1)

## 2018-03-26 MED ORDER — SODIUM CHLORIDE 0.9 % IV SOLN
Freq: Once | INTRAVENOUS | Status: AC
  Administered 2018-03-26: 10:00:00 via INTRAVENOUS
  Filled 2018-03-26: qty 1000

## 2018-03-26 MED ORDER — SODIUM CHLORIDE 0.9 % IV SOLN
200.0000 mg | Freq: Once | INTRAVENOUS | Status: AC
  Administered 2018-03-26: 200 mg via INTRAVENOUS
  Filled 2018-03-26: qty 8

## 2018-03-26 NOTE — Progress Notes (Signed)
Patient here for follow up. C/o pain 9/10 to back, back on head and lower back of neck.
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
## 2018-03-26 NOTE — Patient Instructions (Signed)
Mr. Henton,   Until we can establish care with a new primary care provider I have compiled a list of some local free and sliding scale clinics which provide affordable care, and often have onsite pharmacies, such as:   - [place], [place], [place] [address]- [place]-- [address], [place], [place] [address]- [place]- [address] [place], [place] [address]- [place]- [place], [place] and [phone]   If you need to reach our [facility], please call: [phone] and press option 3.   It was a pleasure meeting you today and thank you for allowing me to participate in your care.  Beckey Rutter, NP
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
## 2018-03-26 NOTE — Progress Notes (Unsigned)
Proceed with Bery[facility] Flock today per Dr. Tasia Catchings

## 2018-03-27 ENCOUNTER — Encounter: Payer: Self-pay | Admitting: Nurse Practitioner

## 2018-03-28 ENCOUNTER — Ambulatory Visit (INDEPENDENT_AMBULATORY_CARE_PROVIDER_SITE_OTHER): Payer: Medicare Other | Admitting: Pulmonary Disease

## 2018-03-28 ENCOUNTER — Ambulatory Visit
Admission: RE | Admit: 2018-03-28 | Discharge: 2018-03-28 | Disposition: A | Discharge status: Home / Self Care | Payer: No Typology Code available for payment source | Source: Ambulatory Visit | Attending: Pulmonary Disease | Admitting: Pulmonary Disease

## 2018-03-28 ENCOUNTER — Encounter: Payer: Self-pay | Admitting: Pulmonary Disease

## 2018-03-28 VITALS — BP 112/82 | HR 111 | Ht 67.0 in | Wt 105.0 lb

## 2018-03-28 DIAGNOSIS — R0902 Hypoxemia: Secondary | ICD-10-CM

## 2018-03-28 DIAGNOSIS — J690 Pneumonitis due to inhalation of food and vomit: Secondary | ICD-10-CM | POA: Diagnosis present

## 2018-03-28 DIAGNOSIS — R918 Other nonspecific abnormal finding of lung field: Secondary | ICD-10-CM | POA: Diagnosis not present

## 2018-03-28 DIAGNOSIS — J90 Pleural effusion, not elsewhere classified: Secondary | ICD-10-CM | POA: Diagnosis not present

## 2018-03-28 DIAGNOSIS — Z87891 Personal history of nicotine dependence: Secondary | ICD-10-CM | POA: Diagnosis not present

## 2018-03-28 DIAGNOSIS — Z8709 Personal history of other diseases of the respiratory system: Secondary | ICD-10-CM | POA: Diagnosis not present

## 2018-03-28 NOTE — Patient Instructions (Addendum)
Chest x-ray today Continue Symbicort inhaler Recommend oxygen therapy at 2 LPM with sleep and exertion Follow-up as needed

## 2018-03-28 NOTE — Progress Notes (Signed)
PULMONARY CONSULT NOTE  Requesting MD/Service: [facility] Date of initial consultation: 03/28/18 Reason for consultation: Recent aspiration pneumonia  PT PROFILE: 67 y.o. male former smoker referred for pulmonary evaluation after recent hospitalization for aspiration pneumonia  DATA:   HPI:  As above.  The patient appears to have significant cognitive deficit and is unable to tell me why he is here.  In addition, the attendant who brought him does not know why he is here and has not worked with him at [facility].  Review of his records indicates that he has had a head neck cancer with radiation therapy to his neck.  He has chronic dysphagia with a gastrostomy tube in place.  He was admitted 03/10/2018 with acute encephalopathy attributed to hyponatremia.  He had just been discharged 2 days previously from a lengthy hospitalization for multiple problems including aspiration PNA.  His most recent chest x-ray from 03/10/2018 reveals hazy infiltrate diffusely on the right.  Presently, he reports exertional dyspnea.  He appears to be profoundly limited, more from weakness than dyspnea.  There is little day-to-day variation in his respiratory symptoms which he notes have been present for several years.  He believes that he has recovered back to his previous baseline.  He denies fever, purulent sputum, chest pain, hemoptysis, lower extremity edema, calf tenderness.  After one of his recent discharges, he was prescribed home oxygen therapy.  He does not appear to be wearing this at all in the skilled nursing facility.  Past Medical History:  Diagnosis Date  . BPH (benign prostatic hyperplasia)   . Chronic dysphagia   . Enlarged prostate   . Hypertension   . Hyponatremia   . 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 Radiation    to neck  . Skin cancer   . Status post chemotherapy   .  Tongue cancer ([place])   . Unspecified severe protein-calorie malnutrition ([place])     Past Surgical History:  Procedure Laterality Date  . cancerous lymph nodes removed from neck    . IR FLUORO RM 30-60 MIN  02/25/2018  . KNEE SURGERY Right   . LAPAROSCOPIC INSERTION GASTROSTOMY TUBE N/A 02/27/2018   Procedure: LAPAROSCOPIC INSERTION GASTROSTOMY TUBE;  Surgeon: Benjamine Sprague, DO;  Location: [facility] ORS;  Service: General;  Laterality: N/A;  . SKIN BIOPSY      MEDICATIONS: I have reviewed all medications and confirmed regimen as documented  Social History   Socioeconomic History  . Marital status: Divorced    Spouse name: Not on file  . Number of children: Not on file  . Years of education: Not on file  . Highest education level: Not on file  Occupational History  . Not on file  Social Needs  . Financial resource strain: Not on file  . Food insecurity:    Worry: Not on file    Inability: Not on file  . Transportation needs:    Medical: Not on file    Non-medical: Not on file  Tobacco Use  . Smoking status: Heavy Tobacco Smoker    Types: Cigarettes  . Smokeless tobacco: Never Used  Substance and Sexual Activity  . Alcohol use: Yes    Comment: 40oz x4 daily  . Drug use: No  . Sexual activity: Not on file  Lifestyle  . Physical activity:    Days per week: Not on file  Minutes per session: Not on file  . Stress: Not on file  Relationships  . Social connections:    Talks on phone: Not on file    Gets together: Not on file    Attends religious service: Not on file    Active member of club or organization: Not on file    Attends meetings of clubs or organizations: Not on file    Relationship status: Not on file  . Intimate partner violence:    Fear of current or ex partner: Not on file    Emotionally abused: Not on file    Physically abused: Not on file    Forced sexual activity: Not on file  Other Topics Concern  . Not on file  Social History Narrative  . Not on file     Family History  Problem Relation Age of Onset  . Hypertension Mother     ROS: No fever, myalgias/arthralgias, unexplained weight loss or weight gain No new focal weakness or sensory deficits No otalgia, hearing loss, visual changes, nasal and sinus symptoms, mouth and throat problems No neck pain or adenopathy No abdominal pain, N/V/D, diarrhea, change in bowel pattern No dysuria, change in urinary pattern   Vitals:   03/28/18 1019 03/28/18 1026  BP:  112/82  Pulse:  (!) 111  SpO2:  91%  Weight: 105 lb (47.6 kg)   Height: 5\' 7"  (1.702 m)   Room air  EXAM:  Gen: Thin, frail, chronically ill-appearing, appears much older than true age, in wheelchair, No overt respiratory distress at rest HEENT: NCAT, sclera pale and white, oropharynx normal Neck: Supple without LAN, thyromegaly, JVD Lungs: breath sounds mildly to moderately diminished with very faint bibasilar crackles, no wheezes, percussion normal Cardiovascular: RRR, no murmurs noted Abdomen: Soft, nontender, normal BS Ext: without clubbing, cyanosis, edema Neuro: CNs grossly intact, motor and sensory intact Skin: Limited exam, no lesions noted  DATA:   BMP Latest Ref Rng & Units 03/26/2018 03/20/2018 03/12/2018  Glucose 70 - 99 mg/dL 118(H) 115(H) -  BUN 8 - 23 mg/dL 25(H) 29(H) -  Creatinine 0.61 - 1.24 mg/dL 0.85 0.97 -  Sodium 135 - 145 mmol/L 138 128(L) 130(L)  Potassium 3.5 - 5.1 mmol/L 5.0 4.8 -  Chloride 98 - 111 mmol/L 96(L) 88(L) -  CO2 22 - 32 mmol/L 29 27 -  Calcium 8.9 - 10.3 mg/dL 9.7 9.0 -    CBC Latest Ref Rng & Units 03/26/2018 03/20/2018 03/10/2018  WBC 3.8 - 10.6 K/uL 11.2(H) 10.6 13.6(H)  Hemoglobin 13.0 - 18.0 g/dL 9.2(L) 9.0(L) 11.2(L)  Hematocrit 40.0 - 52.0 % 27.4(L) 27.1(L) 32.3(L)  Platelets 150 - 440 K/uL 464(H) 500(H) 568(H)    CXR 8/4: Diffuse hazy opacity in right lung  I have personally reviewed all chest radiographs reported above including CXRs and CT chest unless otherwise  indicated  IMPRESSION:     ICD-10-CM   1. Former smoker Z87.891   2. Recent aspiration pneumonia J69.0 DG Chest 2 View  3. Probable COPD Z87.09   4. Borderline hypoxemia R09.02     PLAN:  Chest x-ray ordered for today.  If there are any worrisome findings, we will contact [facility] to arrange for further follow-up  Continue Symbicort inhaler  Recommend oxygen therapy at 2 LPM with sleep and exertion  Follow-up as needed   Merton Border, MD PCCM service Mobile [phone] Pager [phone] 03/28/2018 11:30 AM

## 2018-04-02 ENCOUNTER — Encounter: Payer: Self-pay | Admitting: Oncology

## 2018-04-02 ENCOUNTER — Other Ambulatory Visit: Payer: Self-pay

## 2018-04-02 ENCOUNTER — Inpatient Hospital Stay: Payer: Medicare Other

## 2018-04-02 ENCOUNTER — Inpatient Hospital Stay (HOSPITAL_BASED_OUTPATIENT_CLINIC_OR_DEPARTMENT_OTHER): Payer: Medicare Other | Admitting: Oncology

## 2018-04-02 VITALS — BP 115/72 | HR 73 | Temp 96.1°F | Resp 18 | Wt 103.1 lb

## 2018-04-02 DIAGNOSIS — C760 Malignant neoplasm of head, face and neck: Secondary | ICD-10-CM

## 2018-04-02 DIAGNOSIS — R918 Other nonspecific abnormal finding of lung field: Secondary | ICD-10-CM

## 2018-04-02 DIAGNOSIS — N200 Calculus of kidney: Secondary | ICD-10-CM

## 2018-04-02 DIAGNOSIS — C109 Malignant neoplasm of oropharynx, unspecified: Secondary | ICD-10-CM | POA: Diagnosis not present

## 2018-04-02 DIAGNOSIS — R7989 Other specified abnormal findings of blood chemistry: Secondary | ICD-10-CM

## 2018-04-02 DIAGNOSIS — E890 Postprocedural hypothyroidism: Secondary | ICD-10-CM

## 2018-04-02 DIAGNOSIS — D473 Essential (hemorrhagic) thrombocythemia: Secondary | ICD-10-CM

## 2018-04-02 DIAGNOSIS — D75839 Thrombocytosis, unspecified: Secondary | ICD-10-CM

## 2018-04-02 DIAGNOSIS — Z931 Gastrostomy status: Secondary | ICD-10-CM

## 2018-04-02 DIAGNOSIS — D649 Anemia, unspecified: Secondary | ICD-10-CM | POA: Diagnosis not present

## 2018-04-02 DIAGNOSIS — E43 Unspecified severe protein-calorie malnutrition: Secondary | ICD-10-CM

## 2018-04-02 DIAGNOSIS — E032 Hypothyroidism due to medicaments and other exogenous substances: Secondary | ICD-10-CM

## 2018-04-02 DIAGNOSIS — E871 Hypo-osmolality and hyponatremia: Secondary | ICD-10-CM

## 2018-04-02 DIAGNOSIS — Z5112 Encounter for antineoplastic immunotherapy: Secondary | ICD-10-CM | POA: Diagnosis not present

## 2018-04-02 LAB — CBC WITH DIFFERENTIAL/PLATELET
BASOS ABS: 0 10*3/uL (ref 0–0.1)
Basophils Relative: 0 %
EOS PCT: 3 %
Eosinophils Absolute: 0.3 10*3/uL (ref 0–0.7)
HEMATOCRIT: 26.1 % — AB (ref 40.0–52.0)
Hemoglobin: 8.7 g/dL — ABNORMAL LOW (ref 13.0–18.0)
Lymphocytes Relative: 9 %
Lymphs Abs: 1 10*3/uL (ref 1.0–3.6)
MCH: 29.2 pg (ref 26.0–34.0)
MCHC: 33.5 g/dL (ref 32.0–36.0)
MCV: 87.3 fL (ref 80.0–100.0)
MONO ABS: 0.9 10*3/uL (ref 0.2–1.0)
Monocytes Relative: 9 %
NEUTROS ABS: 8.6 10*3/uL — AB (ref 1.4–6.5)
Neutrophils Relative %: 79 %
PLATELETS: 501 10*3/uL — AB (ref 150–440)
RBC: 2.99 MIL/uL — ABNORMAL LOW (ref 4.40–5.90)
RDW: 16.8 % — AB (ref 11.5–14.5)
WBC: 10.9 10*3/uL — ABNORMAL HIGH (ref 3.8–10.6)

## 2018-04-02 LAB — COMPREHENSIVE METABOLIC PANEL
ALT: 62 U/L — ABNORMAL HIGH (ref 0–44)
ANION GAP: 7 (ref 5–15)
AST: 41 U/L (ref 15–41)
Albumin: 2.8 g/dL — ABNORMAL LOW (ref 3.5–5.0)
Alkaline Phosphatase: 117 U/L (ref 38–126)
BILIRUBIN TOTAL: 0.3 mg/dL (ref 0.3–1.2)
BUN: 22 mg/dL (ref 8–23)
CHLORIDE: 99 mmol/L (ref 98–111)
CO2: 30 mmol/L (ref 22–32)
Calcium: 9.1 mg/dL (ref 8.9–10.3)
Creatinine, Ser: 0.84 mg/dL (ref 0.61–1.24)
Glucose, Bld: 119 mg/dL — ABNORMAL HIGH (ref 70–99)
POTASSIUM: 4.3 mmol/L (ref 3.5–5.1)
Sodium: 136 mmol/L (ref 135–145)
TOTAL PROTEIN: 7.2 g/dL (ref 6.5–8.1)

## 2018-04-02 LAB — TSH: TSH: 21.474 u[IU]/mL — ABNORMAL HIGH (ref 0.350–4.500)

## 2018-04-02 MED ORDER — FERROUS SULFATE 75 (15 FE) MG/ML PO SOLN: 60.0000 mg | Freq: Two times a day (BID) | ORAL | 2 refills | Status: AC

## 2018-04-02 MED ORDER — LEVOTHYROXINE SODIUM 88 MCG PO TABS: 88.0000 ug | ORAL_TABLET | Freq: Every day | ORAL | 0 refills | Status: DC
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
## 2018-04-02 NOTE — Progress Notes (Signed)
Patient here for follow up. Caregiver present with patient.  [facility] uses [facility].

## 2018-04-03 ENCOUNTER — Other Ambulatory Visit: Payer: Self-pay | Admitting: *Deleted

## 2018-04-03 MED ORDER — LEVOTHYROXINE SODIUM 88 MCG PO TABS: 88.0000 ug | ORAL_TABLET | Freq: Every day | ORAL | 0 refills | Status: DC

## 2018-04-04 ENCOUNTER — Telehealth: Payer: Self-pay

## 2018-04-04 NOTE — Telephone Encounter (Signed)
Nutrition  Called RD, Judson Roch at [facility] regarding patient.  Patient currently at [facility] discharged was delayed last week due to daughter not having a safe place for patient discharge.  No date of discharge from facility at this time per RD but will be meeting today to discuss patient care.  RD reports patient will be using [facility] for home health and enteral nutrition.    Patient continues on osmolite 1.5 at 13ml/hr for 24 hours and pedialyte 13ml given q 4 hours vs water flush due to decreased sodium.  RD reports patient tolerating feeding well.  RD reports SLP at [facility] has been working with patient but  deems him to be NPO at this time due to aspiration risk.    Intervention: RD from [facility] to notify RD at cancer center when discharged and final discharge plans.   RD spoke with Elease Etienne and aware of patient and current situation.  Biridiana Twardowski B. Zenia Resides, [place], [place] Registered Dietitian [phone] (pager)

## 2018-04-09 ENCOUNTER — Encounter: Payer: Self-pay | Admitting: Oncology

## 2018-04-15 ENCOUNTER — Telehealth: Payer: Self-pay | Admitting: *Deleted

## 2018-04-15 NOTE — Telephone Encounter (Signed)
Called this number and talked to [employer]. She asks if we will sign her home care orders. Advise her to fax orders to [place] and we will take care of it.  .  thanks.

## 2018-04-15 NOTE — Telephone Encounter (Signed)
Nurse with [facility] called stating she needs orders for patient. Please return her call [phone]

## 2018-04-16 ENCOUNTER — Telehealth: Payer: Self-pay | Admitting: Oncology

## 2018-04-16 ENCOUNTER — Inpatient Hospital Stay: Payer: Medicare Other | Admitting: Oncology

## 2018-04-16 ENCOUNTER — Inpatient Hospital Stay: Payer: Medicare Other

## 2018-04-16 NOTE — Telephone Encounter (Signed)
Left Vm on pt daughter phone to contact office for missed appts. Pt has been DC from [facility] and the address listed is for their facility.

## 2018-04-17 ENCOUNTER — Other Ambulatory Visit: Payer: Self-pay | Admitting: *Deleted

## 2018-04-17 DIAGNOSIS — C760 Malignant neoplasm of head, face and neck: Secondary | ICD-10-CM

## 2018-04-18 ENCOUNTER — Telehealth: Payer: Self-pay

## 2018-04-18 NOTE — Telephone Encounter (Signed)
Nutrition follow-up:  Chart reviewed and noted patient has been discharged from [facility].    Called daughter Tharon Aquas and reports that she and her dad have moved to [place], [place] and he will be receiving treatments at their cancer center and new doctor will be Dr. Rinaldo Ratel. Reports has appointment on Sept 16th.    Phone was breaking up during call.  Daughter reports that she spoke with Almyra Free, RN for Dr. Tasia Catchings yesterday regarding move.    RD will forward message to RN, Almyra Free as well.   Informed daughter that this RD would no longer be checking on patient's nutritional status and would need to follow up with cancer center in [place].  Daughter verbalized understanding.  Ivyanna Sibert B. Zenia Resides, [place], [place] Registered Dietitian [phone] (pager)

## 2018-04-19 NOTE — Telephone Encounter (Signed)
Patient moved with daughter to [place].  Referral to [facility] in [place] sent, appt with their office is scheduled for 04/22/18.

## 2018-04-23 NOTE — Telephone Encounter (Signed)
error 

## 2018-07-05 ENCOUNTER — Emergency Department: Payer: Medicare Other

## 2018-07-05 ENCOUNTER — Inpatient Hospital Stay
Admission: EM | Admit: 2018-07-05 | Discharge: 2018-07-10 | DRG: 871 | Disposition: A | Discharge status: Home Health | Payer: Medicare Other | Attending: Internal Medicine | Admitting: Internal Medicine

## 2018-07-05 ENCOUNTER — Other Ambulatory Visit: Payer: Self-pay

## 2018-07-05 DIAGNOSIS — C300 Malignant neoplasm of nasal cavity: Secondary | ICD-10-CM | POA: Diagnosis present

## 2018-07-05 DIAGNOSIS — Z79899 Other long term (current) drug therapy: Secondary | ICD-10-CM

## 2018-07-05 DIAGNOSIS — Y95 Nosocomial condition: Secondary | ICD-10-CM | POA: Diagnosis not present

## 2018-07-05 DIAGNOSIS — Z79890 Hormone replacement therapy (postmenopausal): Secondary | ICD-10-CM

## 2018-07-05 DIAGNOSIS — Z931 Gastrostomy status: Secondary | ICD-10-CM | POA: Diagnosis not present

## 2018-07-05 DIAGNOSIS — J189 Pneumonia, unspecified organism: Secondary | ICD-10-CM | POA: Diagnosis present

## 2018-07-05 DIAGNOSIS — I129 Hypertensive chronic kidney disease with stage 1 through stage 4 chronic kidney disease, or unspecified chronic kidney disease: Secondary | ICD-10-CM | POA: Diagnosis present

## 2018-07-05 DIAGNOSIS — Z79891 Long term (current) use of opiate analgesic: Secondary | ICD-10-CM

## 2018-07-05 DIAGNOSIS — Z7982 Long term (current) use of aspirin: Secondary | ICD-10-CM

## 2018-07-05 DIAGNOSIS — J440 Chronic obstructive pulmonary disease with acute lower respiratory infection: Secondary | ICD-10-CM | POA: Diagnosis present

## 2018-07-05 DIAGNOSIS — I4891 Unspecified atrial fibrillation: Secondary | ICD-10-CM | POA: Diagnosis present

## 2018-07-05 DIAGNOSIS — Z8249 Family history of ischemic heart disease and other diseases of the circulatory system: Secondary | ICD-10-CM

## 2018-07-05 DIAGNOSIS — Z6820 Body mass index (BMI) 20.0-20.9, adult: Secondary | ICD-10-CM

## 2018-07-05 DIAGNOSIS — C770 Secondary and unspecified malignant neoplasm of lymph nodes of head, face and neck: Secondary | ICD-10-CM | POA: Diagnosis present

## 2018-07-05 DIAGNOSIS — N400 Enlarged prostate without lower urinary tract symptoms: Secondary | ICD-10-CM | POA: Diagnosis present

## 2018-07-05 DIAGNOSIS — F319 Bipolar disorder, unspecified: Secondary | ICD-10-CM | POA: Diagnosis present

## 2018-07-05 DIAGNOSIS — R4702 Dysphasia: Secondary | ICD-10-CM | POA: Diagnosis present

## 2018-07-05 DIAGNOSIS — Z85828 Personal history of other malignant neoplasm of skin: Secondary | ICD-10-CM | POA: Diagnosis not present

## 2018-07-05 DIAGNOSIS — N179 Acute kidney failure, unspecified: Secondary | ICD-10-CM | POA: Diagnosis present

## 2018-07-05 DIAGNOSIS — E039 Hypothyroidism, unspecified: Secondary | ICD-10-CM | POA: Diagnosis present

## 2018-07-05 DIAGNOSIS — G8929 Other chronic pain: Secondary | ICD-10-CM | POA: Diagnosis present

## 2018-07-05 DIAGNOSIS — R1310 Dysphagia, unspecified: Secondary | ICD-10-CM | POA: Diagnosis present

## 2018-07-05 DIAGNOSIS — N189 Chronic kidney disease, unspecified: Secondary | ICD-10-CM | POA: Diagnosis present

## 2018-07-05 DIAGNOSIS — Z8701 Personal history of pneumonia (recurrent): Secondary | ICD-10-CM

## 2018-07-05 DIAGNOSIS — G629 Polyneuropathy, unspecified: Secondary | ICD-10-CM | POA: Diagnosis present

## 2018-07-05 DIAGNOSIS — E43 Unspecified severe protein-calorie malnutrition: Secondary | ICD-10-CM | POA: Diagnosis present

## 2018-07-05 DIAGNOSIS — I10 Essential (primary) hypertension: Secondary | ICD-10-CM | POA: Diagnosis present

## 2018-07-05 DIAGNOSIS — Z7951 Long term (current) use of inhaled steroids: Secondary | ICD-10-CM

## 2018-07-05 DIAGNOSIS — E860 Dehydration: Secondary | ICD-10-CM | POA: Diagnosis present

## 2018-07-05 DIAGNOSIS — C760 Malignant neoplasm of head, face and neck: Secondary | ICD-10-CM | POA: Diagnosis not present

## 2018-07-05 DIAGNOSIS — Z23 Encounter for immunization: Secondary | ICD-10-CM

## 2018-07-05 DIAGNOSIS — Z9221 Personal history of antineoplastic chemotherapy: Secondary | ICD-10-CM

## 2018-07-05 DIAGNOSIS — J449 Chronic obstructive pulmonary disease, unspecified: Secondary | ICD-10-CM | POA: Diagnosis present

## 2018-07-05 DIAGNOSIS — Z923 Personal history of irradiation: Secondary | ICD-10-CM | POA: Diagnosis not present

## 2018-07-05 DIAGNOSIS — C029 Malignant neoplasm of tongue, unspecified: Secondary | ICD-10-CM | POA: Diagnosis present

## 2018-07-05 DIAGNOSIS — K219 Gastro-esophageal reflux disease without esophagitis: Secondary | ICD-10-CM | POA: Diagnosis present

## 2018-07-05 DIAGNOSIS — E038 Other specified hypothyroidism: Secondary | ICD-10-CM | POA: Diagnosis not present

## 2018-07-05 DIAGNOSIS — A419 Sepsis, unspecified organism: Principal | ICD-10-CM | POA: Diagnosis present

## 2018-07-05 DIAGNOSIS — Z87891 Personal history of nicotine dependence: Secondary | ICD-10-CM

## 2018-07-05 LAB — URINALYSIS, ROUTINE W REFLEX MICROSCOPIC
Bilirubin Urine: NEGATIVE
Glucose, UA: NEGATIVE mg/dL
Hgb urine dipstick: NEGATIVE
Ketones, ur: NEGATIVE mg/dL
Leukocytes, UA: NEGATIVE
Nitrite: NEGATIVE
Protein, ur: NEGATIVE mg/dL
Specific Gravity, Urine: 1.016 (ref 1.005–1.030)
pH: 5 (ref 5.0–8.0)

## 2018-07-05 LAB — COMPREHENSIVE METABOLIC PANEL
ALT: 11 U/L (ref 0–44)
AST: 12 U/L — ABNORMAL LOW (ref 15–41)
Albumin: 2.7 g/dL — ABNORMAL LOW (ref 3.5–5.0)
Alkaline Phosphatase: 52 U/L (ref 38–126)
Anion gap: 5 (ref 5–15)
BUN: 42 mg/dL — ABNORMAL HIGH (ref 8–23)
CO2: 30 mmol/L (ref 22–32)
CREATININE: 1.44 mg/dL — AB (ref 0.61–1.24)
Calcium: 8.1 mg/dL — ABNORMAL LOW (ref 8.9–10.3)
Chloride: 104 mmol/L (ref 98–111)
GFR calc Af Amer: 57 mL/min — ABNORMAL LOW (ref >60)
GFR calc non Af Amer: 50 mL/min — ABNORMAL LOW (ref >60)
Glucose, Bld: 117 mg/dL — ABNORMAL HIGH (ref 70–99)
Potassium: 3.4 mmol/L — ABNORMAL LOW (ref 3.5–5.1)
Sodium: 139 mmol/L (ref 135–145)
Total Bilirubin: 0.3 mg/dL (ref 0.3–1.2)
Total Protein: 5.4 g/dL — ABNORMAL LOW (ref 6.5–8.1)

## 2018-07-05 LAB — CBC WITH DIFFERENTIAL/PLATELET
Abs Immature Granulocytes: 0.2 10*3/uL — ABNORMAL HIGH (ref 0.00–0.07)
Basophils Absolute: 0.1 10*3/uL (ref 0.0–0.1)
Basophils Relative: 0 %
EOS PCT: 3 %
Eosinophils Absolute: 0.5 10*3/uL (ref 0.0–0.5)
HCT: 38.2 % — ABNORMAL LOW (ref 39.0–52.0)
Hemoglobin: 11.9 g/dL — ABNORMAL LOW (ref 13.0–17.0)
Immature Granulocytes: 1 %
Lymphocytes Relative: 4 %
Lymphs Abs: 0.8 10*3/uL (ref 0.7–4.0)
MCH: 28.7 pg (ref 26.0–34.0)
MCHC: 31.2 g/dL (ref 30.0–36.0)
MCV: 92 fL (ref 80.0–100.0)
Monocytes Absolute: 0.8 10*3/uL (ref 0.1–1.0)
Monocytes Relative: 4 %
Neutro Abs: 17.5 10*3/uL — ABNORMAL HIGH (ref 1.7–7.7)
Neutrophils Relative %: 88 %
Platelets: 210 10*3/uL (ref 150–400)
RBC: 4.15 MIL/uL — ABNORMAL LOW (ref 4.22–5.81)
RDW: 15.3 % (ref 11.5–15.5)
WBC: 19.8 10*3/uL — ABNORMAL HIGH (ref 4.0–10.5)
nRBC: 0 % (ref 0.0–0.2)

## 2018-07-05 LAB — CG4 I-STAT (LACTIC ACID): Lactic Acid, Venous: 0.99 mmol/L (ref 0.5–1.9)

## 2018-07-05 LAB — PROCALCITONIN: Procalcitonin: 0.24 ng/mL

## 2018-07-05 LAB — TROPONIN I: Troponin I: <0.03 ng/mL (ref <0.03)

## 2018-07-05 MED ORDER — SODIUM CHLORIDE 0.9 % IV BOLUS
1000.0000 mL | Freq: Once | INTRAVENOUS | Status: AC
  Administered 2018-07-05: 1000 mL via INTRAVENOUS

## 2018-07-05 MED ORDER — SODIUM CHLORIDE 0.9 % IV SOLN
1.0000 g | Freq: Once | INTRAVENOUS | Status: AC
  Administered 2018-07-05: 1 g via INTRAVENOUS
  Filled 2018-07-05: qty 1

## 2018-07-05 MED ORDER — SODIUM CHLORIDE 0.9 % IV SOLN: 2.0000 g | Freq: Three times a day (TID) | INTRAVENOUS | Status: DC

## 2018-07-05 MED ORDER — VANCOMYCIN HCL IN DEXTROSE 1-5 GM/200ML-% IV SOLN
1000.0000 mg | Freq: Once | INTRAVENOUS | Status: AC
  Administered 2018-07-05: 1000 mg via INTRAVENOUS
  Filled 2018-07-05: qty 200

## 2018-07-05 MED ORDER — VANCOMYCIN HCL IN DEXTROSE 1-5 GM/200ML-% IV SOLN
1000.0000 mg | INTRAVENOUS | Status: DC
  Administered 2018-07-06 – 2018-07-07 (×2): 1000 mg via INTRAVENOUS
  Filled 2018-07-05 (×2): qty 200

## 2018-07-05 MED ORDER — SODIUM CHLORIDE 0.9 % IV SOLN
2.0000 g | Freq: Two times a day (BID) | INTRAVENOUS | Status: DC
  Administered 2018-07-06 – 2018-07-10 (×9): 2 g via INTRAVENOUS
  Filled 2018-07-05 (×10): qty 2

## 2018-07-05 NOTE — Progress Notes (Signed)
Pharmacy Antibiotic Note  Thomas [name] is a 67 y.o. male admitted on 07/05/2018 with sepsis.  Pharmacy has been consulted for Cefepime dosing.  Plan: Cefepime 1 gm IV X 1 given in ED on 11/29 @ 20:45.  Will order additional cefepime 1 gm IV X 1 to make total starting dose of 2 gm. Will order Cefepime 2 gm IV Q12H to start on 11/30 @ 0900.   Height: 5\' 8"  (172.7 cm) Weight: 140 lb (63.5 kg) IBW/kg (Calculated) : 68.4  Temp (24hrs), Avg:97.1 F (36.2 C), Min:96.4 F (35.8 C), Max:97.6 F (36.4 C)  Recent Labs  Lab 07/05/18 1844 07/05/18 1849  WBC 19.8*  --   CREATININE 1.44*  --   LATICACIDVEN  --  0.99    Estimated Creatinine Clearance: 44.1 mL/min (A) (by C-G formula based on SCr of 1.44 mg/dL (H)).    No Known Allergies  Antimicrobials this admission:   >>    >>   Dose adjustments this admission:   Microbiology results:  BCx:   UCx:    Sputum:    MRSA PCR:   Thank you for allowing pharmacy to be a part of this patient's care.  Michele Judy D 07/05/2018 10:36 PM

## 2018-07-05 NOTE — ED Notes (Signed)
Bear hugger inc to 43 degrees C. Pt understands he has to continue to wear bear hugger until temp inc.

## 2018-07-05 NOTE — ED Notes (Addendum)
Pt/family password is "[email or web address]". [phone] daughter Tharon Aquas is okay to receive updates per pt.

## 2018-07-05 NOTE — ED Notes (Signed)
Pt transported to room 131 

## 2018-07-05 NOTE — ED Provider Notes (Addendum)
[facility] Emergency Department Provider Note  ____________________________________________   I have reviewed the triage vital signs and the nursing notes. Where available I have reviewed prior notes and, if possible and indicated, outside hospital notes.    HISTORY  Chief Complaint Weakness    HPI Thomas [name] is a 67 y.o. 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  No documented fevers.  Family are not yet with patient.  He himself will not give me a history he states "I feel okay".  Declines to endorse anything else this declines to endorse fever chills cough nausea vomiting diarrhea dysuria urinary frequency or other concerns.      Past Medical History:  Diagnosis Date  . BPH (benign prostatic hyperplasia)   . Chronic dysphagia   . Enlarged prostate   . Hypertension   . Hyponatremia   . 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 Radiation    to neck  . Skin cancer   . Status post chemotherapy   . Tongue cancer ([place])   . Unspecified severe protein-calorie malnutrition [facility])     Patient Active Problem List   Diagnosis Date Noted  . Goals of care, counseling/discussion 03/20/2018  . 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 Encephalopathy acute 03/10/2018  . Pressure injury of skin 03/10/2018  . Dysphagia   . Neck mass   . Head and neck cancer ([place]) 02/19/2018  . Cervical lymphadenopathy   . Poor appetite   . Loss of weight   . History of tongue cancer   . Protein-calorie malnutrition, severe 01/22/2016  . Alcohol use disorder, severe, dependence ([place]) 12/12/2014  .  Hyponatremia 12/12/2014  . Essential hypertension 12/12/2014  . COPD (chronic obstructive pulmonary disease) ([place]) 12/12/2014    Past Surgical History:  Procedure Laterality Date  . cancerous lymph nodes removed from neck    . IR FLUORO RM 30-60 MIN  02/25/2018  . KNEE SURGERY Right   . LAPAROSCOPIC INSERTION GASTROSTOMY TUBE N/A 02/27/2018   Procedure: LAPAROSCOPIC INSERTION GASTROSTOMY TUBE;  Surgeon: Benjamine Sprague, DO;  Location: [facility] ORS;  Service: General;  Laterality: N/A;  . SKIN BIOPSY      Prior to Admission medications   Medication Sig Start Date End Date Taking? Authorizing Provider  acidophilus (RISAQUAD) CAPS capsule Take 1 capsule by mouth daily. 03/08/18   [name], MD  amLODipine (NORVASC) 2.5 MG tablet Take 2.5 mg by mouth daily.    [provider]  Baclofen 5 MG TABS Take 5 mg by mouth 2 (two) times daily.    [provider]  budesonide-formoterol (SYMBICORT) 80-4.5 MCG/ACT inhaler Inhale 2 puffs into the lungs 2 (two) times daily.    [provider]  cloNIDine (CATAPRES - DOSED IN MG/24 HR) 0.1 mg/24hr patch Place 1 patch (0.1 mg total) onto the skin once a week. 03/05/18   Hillary Bow, MD  docusate (COLACE) 50 MG/5ML liquid Place 10 mLs (100 mg total) into feeding tube daily as needed for mild constipation. 03/07/18   [name], MD  fentaNYL (DURAGESIC - DOSED MCG/HR) 12 MCG/HR Place 1 patch (12.5 mcg total) onto the skin every 3 (three) days. 03/08/18  [name], MD  ferrous sulfate (FER-IN-SOL) 75 (15 Fe) MG/ML SOLN Place 4 mLs (60 mg of iron total) into feeding tube 2 (two) times daily. 04/02/18   Earlie Server, MD  finasteride (PROSCAR) 5 MG tablet Take 5 mg by mouth at bedtime.    [provider]  fluticasone (FLONASE) 50 MCG/ACT nasal spray Place 1 spray into both nostrils daily.    [provider]  levothyroxine (SYNTHROID) 88 MCG tablet Take 1 tablet (88 mcg total) by mouth daily before breakfast.  04/03/18   Earlie Server, MD  magnesium oxide (MAG-OX) 400 MG tablet Place 400 mg into feeding tube daily.    [provider]  metoprolol tartrate (LOPRESSOR) 100 MG tablet Place 1 tablet (100 mg total) into feeding tube 2 (two) times daily. 03/07/18   [name], MD  Nutritional Supplements (FEEDING SUPPLEMENT, OSMOLITE 1.5 CAL,) LIQD Place 1,000 mLs into feeding tube daily. At 50 cc/hr 03/08/18   [name], MD  oxyCODONE (OXY IR/ROXICODONE) 5 MG immediate release tablet Take 5 mg by mouth every 4 (four) hours as needed for severe pain.    [provider]  prochlorperazine (COMPAZINE) 10 MG tablet Take 1 tablet (10 mg total) by mouth every 6 (six) hours as needed (Nausea or vomiting). 03/21/18   Earlie Server, MD  sennosides (SENOKOT) 8.8 MG/5ML syrup Place 5 mLs into feeding tube daily as needed for mild constipation. 03/07/18   [name], MD  sodium chloride 1 g tablet Take 1 tablet (1 g total) by mouth 3 (three) times daily with meals. 03/21/18   Earlie Server, MD  venlafaxine (EFFEXOR) 37.5 MG tablet Place 1 tablet (37.5 mg total) into feeding tube 2 (two) times daily with a meal. 03/01/18   Hillary Bow, MD  Water For Irrigation, Sterile (FREE WATER) SOLN Place 20 mLs into feeding tube every 4 (four) hours. 03/12/18   Bettey Costa, MD    Allergies Patient has no known allergies.  Family History  Problem Relation Age of Onset  . Hypertension Mother     Social History Social History   Tobacco Use  . Smoking status: Heavy Tobacco Smoker    Types: Cigarettes  . Smokeless tobacco: Never Used  Substance Use Topics  . Alcohol use: Yes    Comment: 40oz x4 daily  . Drug use: No    Review of Systems Constitutional: No fever/chills Eyes: No visual changes. ENT: No sore throat. No stiff neck no neck pain Cardiovascular: Denies chest pain. Respiratory: Denies shortness of breath. Gastrointestinal:   no vomiting.  No diarrhea.  No constipation. Genitourinary:  Negative for dysuria. Musculoskeletal: Negative lower extremity swelling Skin: Negative for rash. Neurological: Negative for severe headaches, focal weakness or numbness.   ____________________________________________   PHYSICAL EXAM:  VITAL SIGNS: ED Triage Vitals  Enc Vitals Group     BP 07/05/18 1831 (!) 109/56     Pulse Rate 07/05/18 1831 63     Resp 07/05/18 1906 19     Temp 07/05/18 1851 (!) 97.3 F (36.3 C)     Temp Source 07/05/18 1851 Rectal     SpO2 07/05/18 1831 100 %     Weight 07/05/18 1838 140 lb (63.5 kg)     Height 07/05/18 1838 5\' 8"  (1.727 m)     Head Circumference --      Peak Flow --      Pain Score 07/05/18 1838 0     Pain Loc --      Pain Edu? --  Excl. in [place]? --     Constitutional: Chronically ill-appearing gentleman not appear to be in any acute distress Eyes: Conjunctivae are normal Head: Atraumatic HEENT: No congestion/rhinnorhea. Mucous membranes are moist.  Oropharynx non-erythematous Neck:   Nontender with no meningismus, no masses, no stridor Cardiovascular: Normal rate, regular rhythm. Grossly normal heart sounds.  Good peripheral circulation. Respiratory: Normal respiratory effort.  No retractions.  Coarse breath sounds Abdominal: Soft and nontender. No distention. No guarding no rebound G-tube in place with no irritation around it Back:  There is no focal tenderness or step off.  there is no midline tenderness there are no lesions noted. there is no CVA tenderness  Musculoskeletal: No lower extremity tenderness, no upper extremity tenderness. No joint effusions, no DVT signs strong distal pulses no edema Neurologic:  Normal speech and language. No gross focal neurologic deficits are appreciated.  Skin:  Skin is warm, dry and intact. No rash noted. Psychiatric: Mood and affect are normal. Speech and behavior are normal.  ____________________________________________   LABS (all labs ordered are listed, but only abnormal results are  displayed)  Labs Reviewed  COMPREHENSIVE METABOLIC PANEL - Abnormal; Notable for the following components:      Result Value   Potassium 3.4 (*)    Glucose, Bld 117 (*)    BUN 42 (*)    Creatinine, Ser 1.44 (*)    Calcium 8.1 (*)    Total Protein 5.4 (*)    Albumin 2.7 (*)    AST 12 (*)    GFR calc non Af Amer 50 (*)    GFR calc Af Amer 57 (*)    All other components within normal limits  CBC WITH DIFFERENTIAL/PLATELET - Abnormal; Notable for the following components:   WBC 19.8 (*)    RBC 4.15 (*)    Hemoglobin 11.9 (*)    HCT 38.2 (*)    Neutro Abs 17.5 (*)    Abs Immature Granulocytes 0.20 (*)    All other components within normal limits  CULTURE, BLOOD (ROUTINE X 2)  CULTURE, BLOOD (ROUTINE X 2)  URINE CULTURE  TROPONIN I  LACTIC ACID, PLASMA  LACTIC ACID, PLASMA  URINALYSIS, ROUTINE W REFLEX MICROSCOPIC  CG4 I-STAT (LACTIC ACID)    Pertinent labs  results that were available during my care of the patient were reviewed by me and considered in my medical decision making (see chart for details). ____________________________________________  EKG  I personally interpreted any EKGs ordered by me or triage RBB LAFB rate 61 sinus rhythm no acute ischemia noted. ____________________________________________  RADIOLOGY  Pertinent labs & imaging results that were available during my care of the patient were reviewed by me and considered in my medical decision making (see chart for details). If possible, patient and/or family made aware of any abnormal findings.  No results found. ____________________________________________    PROCEDURES  Procedure(s) performed: None  Procedures  Critical Care performed: None  ____________________________________________   INITIAL IMPRESSION / ASSESSMENT AND PLAN / ED COURSE  Pertinent labs & imaging results that were available during my care of the patient were reviewed by me and considered in my medical decision making (see  chart for details).  Patient here feeling generally weak and hypotensive for EMS.  Did receive fluid blood pressures are better here, we will continue to hydrate him, kidney function is somewhat elevated.  He is afebrile rectally.  Certainly possible that he is infected, however, no obvious source noted.  His white count is somewhat elevated,  again nonspecific, lactic acid is normal making sepsis or severe sepsis much less likely, will check urinalysis, cardiac enzymes are negative, low suspicion for cardiogenic hypotension, he does appear to be dehydrated this is most likely why the sugar and lowering his creatinine is up, however, we will continue to observe him.  His hemoglobin is elevated as well suggesting that he has some degree of contraction of his plasma resulting in hemoconcentration of his results.  ----------------------------------------- 8:16 PM on 07/05/2018 -----------------------------------------  Was able to talk to his daughter, he did stop on the store on the way in, she states the patient has had decreased hydration through his tube and was recently discharged 2 days ago from another hospital where he was being treated apparently for pneumonia.  She states that he has been having low blood pressure and she is been increasing his tube feeds gradually since he went home but initially, the amount that she was giving was much less than she had been giving him.  She states that he has not had any fevers or vomiting at home.  He does have oxygen requirement now which apparently is new since his last hospitalization, it is unclear.  In any event, she was concerned because his blood pressure was low according to health nurse.  It was 90.  She is not sure exactly what his normal blood pressure is.  He does still have a cough he is taking amoxicillin at home through the tube for his known pneumonia.  With this further history, we will broaden his antibiotic care, chest x-ray does show a possible  infiltrate.  Last office visit showed   bp of 100 and before that his outpt bp was 88/48 so his blood pressures have been running low, family does recollect this now, they also state that they have been taking him off his blood pressure medications gradually but he still on 100 of metoprolol a day and he had that not long before his blood pressure started to go down.  Lactic again is normal and his mentation seems to be at his baseline, pulses not markedly elevated but again he is beta blocked, he is a little bit hypothermic, we are treating him as possible sepsis but with a normal lactic it is difficult to know if that is actually what is going on.  No evidence of bleeding.  And again, his blood pressure chronically is very low so it is not a particularly good marker.  White count is elevated but he has a recent hospitalization for pneumonia, we are going to treat him for H CAP here.  Patient has exceedingly small arms from wasting away from oncologic processes, and we are using a cuff I think that is way too big for him.  We are changing his cuff to see if that gives [place] a more accurate number.  ----------------------------------------- 9:38 PM on 07/05/2018 -----------------------------------------  She remains nontoxic, again lactic is negative, now that we have a blood pressure cuff that actually fits his arms, he is quite emaciated, we are getting pressures I think are more consistent with what is real.  I do not believe he was likely as low as his vital signs suggested.  Nonetheless we have initiated a septic work-up here.  Patient will be admitted to the hospital.  Urinalysis is pending.   ____________________________________________   FINAL CLINICAL IMPRESSION(S) / ED DIAGNOSES  Final diagnoses:  None      This chart was dictated using voice recognition software.  Despite  best efforts to proofread,  errors can occur which can change meaning.      Schuyler Amor, MD 07/05/18  1931    Schuyler Amor, MD 07/05/18 2019    Schuyler Amor, MD 07/05/18 2034    Schuyler Amor, MD 07/05/18 2042    Schuyler Amor, MD 07/05/18 2138

## 2018-07-05 NOTE — ED Notes (Signed)
2nd set of cultures sent to lab.

## 2018-07-05 NOTE — H&P (Signed)
[facility] [place] NAME: Thomas [name]    MR#:  [id number]  DATE OF BIRTH:  [birth year]  DATE OF ADMISSION:  07/05/2018  PRIMARY CARE PHYSICIAN: [facility]   REQUESTING/REFERRING PHYSICIAN: Burlene Arnt, MD  CHIEF COMPLAINT:   Chief Complaint  Patient presents with  . Weakness    HISTORY OF PRESENT ILLNESS:  Thomas [name]  is a 67 y.o. male who presents with chief complaint as above.  Patient presents to the ED tonight with increased shortness of breath.  He was recently hospitalized in another hospital and treated for aspiration pneumonia.  Patient's daughter states that she feels like he may have been discharged too soon.  She had to increase his oxygen usage over the past couple of days due to increased shortness of breath.  Blood pressure at home initially was very low, which prompted her to bring him in.  Was initially low here as well.  Work-up is consistent with sepsis, most likely due to persistent/recurrent pneumonia.  Hospitalist called for admission.  Of note, patient is actively being treated for cancer, though his daughter states he has not received chemotherapy treatments for the past several weeks due to illness and hospitalizations.  She requests oncology consult  PAST MEDICAL HISTORY:   Past Medical History:  Diagnosis Date  . BPH (benign prostatic hyperplasia)   . Chronic dysphagia   . Enlarged prostate   . Hypertension   . Hyponatremia   . 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 Radiation    to neck  . Skin cancer   . Status post chemotherapy   . Tongue cancer ([place])   . Unspecified severe protein-calorie malnutrition ([place])      PAST SURGICAL HISTORY:   Past Surgical History:  Procedure Laterality Date  . cancerous lymph nodes removed from neck    . IR FLUORO RM 30-60 MIN  02/25/2018  . KNEE  SURGERY Right   . LAPAROSCOPIC INSERTION GASTROSTOMY TUBE N/A 02/27/2018   Procedure: LAPAROSCOPIC INSERTION GASTROSTOMY TUBE;  Surgeon: Benjamine Sprague, DO;  Location: [facility] ORS;  Service: General;  Laterality: N/A;  . SKIN BIOPSY       SOCIAL HISTORY:   Social History   Tobacco Use  . Smoking status: Heavy Tobacco Smoker    Types: Cigarettes  . Smokeless tobacco: Never Used  Substance Use Topics  . Alcohol use: Yes    Comment: 40oz x4 daily     FAMILY HISTORY:   Family History  Problem Relation Age of Onset  . Hypertension Mother      DRUG ALLERGIES:  No Known Allergies  MEDICATIONS AT HOME:   Prior to Admission medications   Medication Sig Start Date End Date Taking? Authorizing Provider  acetaminophen (TYLENOL) 650 MG CR tablet Take 1,300 mg by mouth See admin instructions. Give 2 tablets (1300MG ) by mouth 2 times daily and 1 tablet (650MG ) by mouth daily as needed for headache or fever   Yes [provider]  acidophilus (RISAQUAD) CAPS capsule Take 1 capsule by mouth daily. 03/08/18  Yes [name], MD  amiodarone (PACERONE) 200 MG tablet Take 200 mg by mouth daily. 06/03/18  Yes [provider]  aspirin EC 81 MG tablet Take 81 mg by mouth daily.   Yes [provider]  budesonide-formoterol (SYMBICORT) 80-4.5 MCG/ACT inhaler Inhale 2 puffs into the lungs  2 (two) times daily.   Yes [provider]  diclofenac (VOLTAREN) 75 MG EC tablet Take 75 mg by mouth 2 (two) times daily. 06/25/18  Yes [provider]  famotidine (PEPCID) 20 MG tablet Take 20 mg by mouth 2 (two) times daily. 06/03/18  Yes [provider]  finasteride (PROSCAR) 5 MG tablet Take 5 mg by mouth at bedtime.   Yes [provider]  fluticasone (FLONASE) 50 MCG/ACT nasal spray Place 1 spray into both nostrils daily.   Yes [provider]  gabapentin (NEURONTIN) 300 MG capsule Take 300 mg by mouth 3 (three) times daily. 06/25/18  Yes  [provider]  guaifenesin (HUMIBID E) 400 MG TABS tablet Take 400 mg by mouth 4 (four) times daily.   Yes [provider]  levothyroxine (SYNTHROID, LEVOTHROID) 100 MCG tablet Take 100 mcg by mouth daily. 06/24/18  Yes [provider]  magnesium oxide (MAG-OX) 400 MG tablet Take 400 mg by mouth daily.    Yes [provider]  metoprolol tartrate (LOPRESSOR) 100 MG tablet Place 1 tablet (100 mg total) into feeding tube 2 (two) times daily. 03/07/18  Yes [name], MD  morphine (MS CONTIN) 30 MG 12 hr tablet Take 15 mg by mouth every 12 (twelve) hours.   Yes [provider]  Multiple Vitamin (MULTIVITAMIN WITH MINERALS) TABS tablet Take 1 tablet by mouth daily.   Yes [provider]  Nutritional Supplements (FEEDING SUPPLEMENT, OSMOLITE 1.5 CAL,) LIQD Place 1,000 mLs into feeding tube daily. At 50 cc/hr 03/08/18  Yes [name], MD  ondansetron (ZOFRAN-ODT) 8 MG disintegrating tablet Take 8 mg by mouth every 12 (twelve) hours as needed for nausea/vomiting. 07/03/18  Yes [provider]  oxyCODONE (OXY IR/ROXICODONE) 5 MG immediate release tablet Take 5 mg by mouth every 6 (six) hours.    Yes [provider]  polyethylene glycol (MIRALAX / GLYCOLAX) packet Take 17 g by mouth daily as needed for moderate constipation or severe constipation.    Yes [provider]  sodium chloride 1 g tablet Take 1 tablet (1 g total) by mouth 3 (three) times daily with meals. 03/21/18  Yes Earlie Server, MD  venlafaxine (EFFEXOR) 37.5 MG tablet Place 1 tablet (37.5 mg total) into feeding tube 2 (two) times daily with a meal. 03/01/18  Yes Sudini, Alveta Heimlich, MD  Water For Irrigation, Sterile (FREE WATER) SOLN Place 20 mLs into feeding tube every 4 (four) hours. 03/12/18  Yes Mody, Ulice Bold, MD  cloNIDine (CATAPRES - DOSED IN MG/24 HR) 0.1 mg/24hr patch Place 1 patch (0.1 mg total) onto the skin once a week. Patient not taking: Reported on  07/05/2018 03/05/18   Hillary Bow, MD  docusate (COLACE) 50 MG/5ML liquid Place 10 mLs (100 mg total) into feeding tube daily as needed for mild constipation. Patient not taking: Reported on 07/05/2018 03/07/18   [name], MD  fentaNYL (DURAGESIC - DOSED MCG/HR) 12 MCG/HR Place 1 patch (12.5 mcg total) onto the skin every 3 (three) days. Patient not taking: Reported on 07/05/2018 03/08/18   [name], MD  ferrous sulfate (FER-IN-SOL) 75 (15 Fe) MG/ML SOLN Place 4 mLs (60 mg of iron total) into feeding tube 2 (two) times daily. Patient not taking: Reported on 07/05/2018 04/02/18   Earlie Server, MD  prochlorperazine (COMPAZINE) 10 MG tablet Take 1 tablet (10 mg total) by mouth every 6 (six) hours as needed (Nausea or vomiting). Patient not taking: Reported on 07/05/2018 03/21/18   Earlie Server,  MD  sennosides (SENOKOT) 8.8 MG/5ML syrup Place 5 mLs into feeding tube daily as needed for mild constipation. Patient not taking: Reported on 07/05/2018 03/07/18   [name], MD    REVIEW OF SYSTEMS:  Review of Systems  Constitutional: Positive for malaise/fatigue. Negative for chills, fever and weight loss.  HENT: Negative for ear pain, hearing loss and tinnitus.   Eyes: Negative for blurred vision, double vision, pain and redness.  Respiratory: Positive for cough and shortness of breath. Negative for hemoptysis.   Cardiovascular: Negative for chest pain, palpitations, orthopnea and leg swelling.  Gastrointestinal: Negative for abdominal pain, constipation, diarrhea, nausea and vomiting.  Genitourinary: Negative for dysuria, frequency and hematuria.  Musculoskeletal: Negative for back pain, joint pain and neck pain.  Skin:       No acne, rash, or lesions  Neurological: Negative for dizziness, tremors, focal weakness and weakness.  Endo/Heme/Allergies: Negative for polydipsia. Does not bruise/bleed easily.  Psychiatric/Behavioral: Negative for depression. The patient is not  nervous/anxious and does not have insomnia.      VITAL SIGNS:   Vitals:   07/05/18 2115 07/05/18 2127 07/05/18 2130 07/05/18 2145  BP: 121/77  137/87 131/76  Pulse: 66  66 66  Resp: 12  12 16   Temp:  (!) 97.1 F (36.2 C)    TempSrc:  Rectal    SpO2: 96%  96% 94%  Weight:      Height:       Wt Readings from Last 3 Encounters:  07/05/18 63.5 kg  04/02/18 46.8 kg  03/28/18 47.6 kg    PHYSICAL EXAMINATION:  Physical Exam  Vitals reviewed. Constitutional: He is oriented to person, place, and time. He appears well-developed. No distress.  HENT:  Head: Normocephalic and atraumatic.  Mouth/Throat: Oropharynx is clear and moist.  Eyes: Pupils are equal, round, and reactive to light. Conjunctivae and EOM are normal. No scleral icterus.  Neck: Normal range of motion. Neck supple. No JVD present. No thyromegaly present.  Cardiovascular: Normal rate, regular rhythm and intact distal pulses. Exam reveals no gallop and no friction rub.  No murmur heard. Respiratory: Effort normal. No respiratory distress. He has no wheezes. He has no rales.  Right sided rhonchi  GI: Soft. Bowel sounds are normal. He exhibits no distension. There is no tenderness.  Musculoskeletal: Normal range of motion. He exhibits no edema.  No arthritis, no gout  Lymphadenopathy:    He has no cervical adenopathy.  Neurological: He is alert and oriented to person, place, and time. No cranial nerve deficit.  No dysarthria, no aphasia  Skin: Skin is warm and dry. No rash noted. No erythema.  Psychiatric: He has a normal mood and affect. His behavior is normal. Judgment and thought content normal.    LABORATORY PANEL:   CBC Recent Labs  Lab 07/05/18 1844  WBC 19.8*  HGB 11.9*  HCT 38.2*  PLT 210   ------------------------------------------------------------------------------------------------------------------  Chemistries  Recent Labs  Lab 07/05/18 1844  NA 139  K 3.4*  CL 104  CO2 30  GLUCOSE  117*  BUN 42*  CREATININE 1.44*  CALCIUM 8.1*  AST 12*  ALT 11  ALKPHOS 52  BILITOT 0.3   ------------------------------------------------------------------------------------------------------------------  Cardiac Enzymes Recent Labs  Lab 07/05/18 1844  TROPONINI <0.03   ------------------------------------------------------------------------------------------------------------------  RADIOLOGY:  Dg Chest Port 1 View  Result Date: 07/05/2018 CLINICAL DATA:  Weakness EXAM: PORTABLE CHEST 1 VIEW COMPARISON:  17/00/1749 FINDINGS: Metallic opacities over the left shoulder. Small left pleural effusion. Hazy  bibasilar opacity. Stable heart size with aortic atherosclerosis. No pneumothorax. IMPRESSION: Hazy bibasilar airspace disease which may reflect atelectasis, pneumonia or aspiration. There is trace left pleural effusion Electronically Signed   By: Donavan Foil M.D.   On: 07/05/2018 19:36    EKG:   Orders placed or performed during the hospital encounter of 07/05/18  . EKG 12-Lead  . EKG 12-Lead  . ED EKG 12-Lead  . ED EKG 12-Lead    IMPRESSION AND PLAN:  Principal Problem:   Sepsis ([place]) -IV antibiotics given, lactic acid was normal, cultures sent, blood pressure stable after initial treatment in the ED.  Sources pneumonia as below Active Problems:   HCAP (healthcare-associated pneumonia) -antibiotics as above, other supportive treatment PRN   AKI (acute kidney injury) ([place]) -gentle IV fluids to the night tonight, monitor for expected improvement, avoid nephrotoxins   Essential hypertension -hold antihypertensives for now   COPD (chronic obstructive pulmonary disease) ([place]) -home dose inhalers   Hypothyroidism -home dose thyroid replacement  Chart review performed and case discussed with ED provider. Labs, imaging and/or ECG reviewed by provider and discussed with patient/family. Management plans discussed with the patient and/or family.  DVT PROPHYLAXIS: SubQ lovenox    GI PROPHYLAXIS:  H2 blocker  ADMISSION STATUS: Inpatient     CODE STATUS: Full Code Status History    Date Active Date Inactive Code Status Order ID Comments User Context   03/10/2018 1727 03/12/2018 1626 Full Code [id number]  Gorden Harms, MD Inpatient   02/19/2018 1232 03/08/2018 1453 Full Code [id number]  Gorden Harms, MD Inpatient   02/19/2016 1355 02/22/2016 2000 Full Code [id number]  Hower, Aaron Mose, MD ED   02/07/2016 0230 02/09/2016 1859 Full Code [id number]  Harrie Foreman, MD Inpatient   01/22/2016 0552 01/26/2016 1805 Full Code [id number]  Harrie Foreman, MD Inpatient   08/17/2015 2229 08/21/2015 1800 Full Code [id number]  Lance Coon, MD Inpatient   12/12/2014 0119 12/18/2014 1659 Full Code [id number]  Hower, Aaron Mose, MD ED    Advance Directive Documentation     Most Recent Value  Type of Advance Directive  Healthcare Power of Attorney  Pre-existing out of facility DNR order (yellow form or pink MOST form)  -  "MOST" Form in Place?  -      TOTAL TIME TAKING CARE OF THIS PATIENT: 45 minutes.   Thomas [name] 07/05/2018, 10:29 PM  [facility]  Office  [phone]  CC: Primary care physician; [facility]  Note:  This document was prepared using Dragon voice recognition software and may include unintentional dictation errors.

## 2018-07-05 NOTE — ED Triage Notes (Addendum)
Pt brought in via EMS from home d/t weakness. Recent aspriation PNA. Syncopal episodes per EMS. 103/60 BP post 1 2/1H bolus (70 systolic b4 bolus). BG 133 per EMS. 94% on 3L (usual from home). 18G L ac IV placed by EMS. Current cancer pt.

## 2018-07-05 NOTE — Progress Notes (Signed)
Pharmacy Antibiotic Note  Thomas [name] is a 67 y.o. male admitted on 07/05/2018 with sepsis.  Pharmacy has been consulted for Cefepime and vancomycin dosing.  Plan: Cefepime 1 gm IV X 1 given in ED on 11/29 @ 20:45.  Will order additional cefepime 1 gm IV X 1 to make total starting dose of 2 gm. Will order Cefepime 2 gm IV Q12H to start on 11/30 @ 0900.   Vancomycin DW 64kg  Vd 45L kei 0.041 hr-1  T1/2 17 hours Vancomycin 1 gram q 24 hours ordered with stacked dosing. Level before 5th dose.goal trough 15-20.  Height: 5\' 8"  (172.7 cm) Weight: 140 lb (63.5 kg) IBW/kg (Calculated) : 68.4  Temp (24hrs), Avg:97.1 F (36.2 C), Min:96.4 F (35.8 C), Max:97.6 F (36.4 C)  Recent Labs  Lab 07/05/18 1844 07/05/18 1849  WBC 19.8*  --   CREATININE 1.44*  --   LATICACIDVEN  --  0.99    Estimated Creatinine Clearance: 44.1 mL/min (A) (by C-G formula based on SCr of 1.44 mg/dL (H)).    No Known Allergies  Antimicrobials this admission:   >>    >>   Dose adjustments this admission:   Microbiology results: 11/29 BCx: pending  11/29 UCx:  pending  Sputum:    MRSA PCR:   Thank you for allowing pharmacy to be a part of this patient's care.  Izabel Chim S 07/05/2018 10:51 PM

## 2018-07-05 NOTE — ED Notes (Addendum)
Peg tube unremarkable. Small amount of yellow discharge at sight.

## 2018-07-05 NOTE — ED Notes (Signed)
Pt takes pembrolizumab for chemo per family.

## 2018-07-05 NOTE — ED Notes (Signed)
Bear hugger applied to pt.

## 2018-07-06 ENCOUNTER — Inpatient Hospital Stay: Payer: Medicare Other

## 2018-07-06 ENCOUNTER — Other Ambulatory Visit: Payer: Self-pay

## 2018-07-06 DIAGNOSIS — E860 Dehydration: Secondary | ICD-10-CM

## 2018-07-06 DIAGNOSIS — J189 Pneumonia, unspecified organism: Secondary | ICD-10-CM

## 2018-07-06 DIAGNOSIS — N179 Acute kidney failure, unspecified: Secondary | ICD-10-CM

## 2018-07-06 DIAGNOSIS — C760 Malignant neoplasm of head, face and neck: Secondary | ICD-10-CM

## 2018-07-06 DIAGNOSIS — E038 Other specified hypothyroidism: Secondary | ICD-10-CM

## 2018-07-06 LAB — CBC
HCT: 39.4 % (ref 39.0–52.0)
Hemoglobin: 11.8 g/dL — ABNORMAL LOW (ref 13.0–17.0)
MCH: 28.5 pg (ref 26.0–34.0)
MCHC: 29.9 g/dL — ABNORMAL LOW (ref 30.0–36.0)
MCV: 95.2 fL (ref 80.0–100.0)
Platelets: 224 10*3/uL (ref 150–400)
RBC: 4.14 MIL/uL — ABNORMAL LOW (ref 4.22–5.81)
RDW: 15.6 % — ABNORMAL HIGH (ref 11.5–15.5)
WBC: 20.3 10*3/uL — ABNORMAL HIGH (ref 4.0–10.5)
nRBC: 0 % (ref 0.0–0.2)

## 2018-07-06 LAB — BASIC METABOLIC PANEL
Anion gap: 8 (ref 5–15)
BUN: 36 mg/dL — ABNORMAL HIGH (ref 8–23)
CALCIUM: 8.2 mg/dL — AB (ref 8.9–10.3)
CO2: 24 mmol/L (ref 22–32)
Chloride: 108 mmol/L (ref 98–111)
Creatinine, Ser: 1.22 mg/dL (ref 0.61–1.24)
GFR calc Af Amer: >60 mL/min (ref >60)
GFR calc non Af Amer: >60 mL/min (ref >60)
Glucose, Bld: 99 mg/dL (ref 70–99)
Potassium: 4.3 mmol/L (ref 3.5–5.1)
Sodium: 140 mmol/L (ref 135–145)

## 2018-07-06 LAB — GLUCOSE, CAPILLARY
GLUCOSE-CAPILLARY: 75 mg/dL (ref 70–99)
Glucose-Capillary: 86 mg/dL (ref 70–99)
Glucose-Capillary: 51 mg/dL — ABNORMAL LOW (ref 70–99)
Glucose-Capillary: 105 mg/dL — ABNORMAL HIGH (ref 70–99)
Glucose-Capillary: 98 mg/dL (ref 70–99)

## 2018-07-06 LAB — MRSA PCR SCREENING: MRSA by PCR: POSITIVE — AB

## 2018-07-06 MED ORDER — FREE WATER
200.0000 mL | Freq: Four times a day (QID) | Status: DC
  Administered 2018-07-06 – 2018-07-10 (×19): 200 mL

## 2018-07-06 MED ORDER — JEVITY 1.5 CAL/FIBER PO LIQD
1000.0000 mL | ORAL | Status: DC
  Administered 2018-07-06 – 2018-07-09 (×4): 1000 mL

## 2018-07-06 MED ORDER — OXYCODONE HCL 5 MG PO TABS
5.0000 mg | ORAL_TABLET | Freq: Four times a day (QID) | ORAL | Status: DC | PRN
  Administered 2018-07-10: 10:00:00 5 mg
  Filled 2018-07-06: qty 1

## 2018-07-06 MED ORDER — JEVITY 1.5 CAL/FIBER PO LIQD
1000.0000 mL | ORAL | Status: DC
  Administered 2018-07-06: 1000 mL

## 2018-07-06 MED ORDER — SODIUM CHLORIDE 0.9 % IV SOLN
INTRAVENOUS | Status: AC
  Administered 2018-07-06: 01:00:00 via INTRAVENOUS

## 2018-07-06 MED ORDER — LEVOTHYROXINE SODIUM 100 MCG PO TABS
100.0000 ug | ORAL_TABLET | Freq: Every day | ORAL | Status: DC
  Administered 2018-07-06 – 2018-07-10 (×5): 100 ug
  Filled 2018-07-06 (×5): qty 1

## 2018-07-06 MED ORDER — ACETAMINOPHEN 650 MG RE SUPP: 650.0000 mg | Freq: Four times a day (QID) | RECTAL | Status: DC | PRN

## 2018-07-06 MED ORDER — SODIUM CHLORIDE 0.9 % IV SOLN
INTRAVENOUS | Status: DC | PRN
  Administered 2018-07-06 – 2018-07-10 (×4): 250 mL via INTRAVENOUS

## 2018-07-06 MED ORDER — OSMOLITE 1.5 CAL PO LIQD: 1000.0000 mL | ORAL | Status: DC

## 2018-07-06 MED ORDER — INFLUENZA VAC SPLIT HIGH-DOSE 0.5 ML IM SUSY
0.5000 mL | PREFILLED_SYRINGE | INTRAMUSCULAR | Status: AC
  Administered 2018-07-08: 0.5 mL via INTRAMUSCULAR
  Filled 2018-07-06: qty 0.5

## 2018-07-06 MED ORDER — JEVITY 1.2 CAL PO LIQD: 1000.0000 mL | ORAL | Status: DC

## 2018-07-06 MED ORDER — ACETAMINOPHEN 325 MG PO TABS: 650.0000 mg | ORAL_TABLET | Freq: Four times a day (QID) | ORAL | Status: DC | PRN

## 2018-07-06 MED ORDER — GABAPENTIN 300 MG PO CAPS
300.0000 mg | ORAL_CAPSULE | Freq: Three times a day (TID) | ORAL | Status: DC
  Administered 2018-07-06 – 2018-07-10 (×14): 300 mg
  Filled 2018-07-06 (×14): qty 1

## 2018-07-06 MED ORDER — FERROUS SULFATE 220 (44 FE) MG/5ML PO ELIX
220.0000 mg | ORAL_SOLUTION | Freq: Every day | ORAL | Status: DC
  Administered 2018-07-06 – 2018-07-10 (×5): 220 mg
  Filled 2018-07-06 (×6): qty 5

## 2018-07-06 MED ORDER — ENOXAPARIN SODIUM 40 MG/0.4ML ~~LOC~~ SOLN
40.0000 mg | SUBCUTANEOUS | Status: DC
  Administered 2018-07-06 – 2018-07-09 (×4): 40 mg via SUBCUTANEOUS
  Filled 2018-07-06 (×5): qty 0.4

## 2018-07-06 MED ORDER — MORPHINE SULFATE ER 15 MG PO TBCR
15.0000 mg | EXTENDED_RELEASE_TABLET | Freq: Two times a day (BID) | ORAL | Status: DC
  Administered 2018-07-06 – 2018-07-09 (×9): 15 mg via ORAL
  Filled 2018-07-06 (×9): qty 1

## 2018-07-06 MED ORDER — VENLAFAXINE HCL 37.5 MG PO TABS
37.5000 mg | ORAL_TABLET | Freq: Two times a day (BID) | ORAL | Status: DC
  Administered 2018-07-06 – 2018-07-10 (×9): 37.5 mg
  Filled 2018-07-06 (×10): qty 1

## 2018-07-06 MED ORDER — CHLORHEXIDINE GLUCONATE CLOTH 2 % EX PADS
6.0000 | MEDICATED_PAD | Freq: Every day | CUTANEOUS | Status: AC
  Administered 2018-07-06 – 2018-07-10 (×5): 6 via TOPICAL

## 2018-07-06 MED ORDER — MUPIROCIN 2 % EX OINT
1.0000 "application " | TOPICAL_OINTMENT | Freq: Two times a day (BID) | CUTANEOUS | Status: DC
  Administered 2018-07-06 – 2018-07-10 (×9): 1 via NASAL
  Filled 2018-07-06: qty 22

## 2018-07-06 MED ORDER — FINASTERIDE 5 MG PO TABS
5.0000 mg | ORAL_TABLET | Freq: Every day | ORAL | Status: DC
  Administered 2018-07-06 – 2018-07-09 (×5): 5 mg via ORAL
  Filled 2018-07-06 (×5): qty 1

## 2018-07-06 MED ORDER — AMIODARONE HCL 200 MG PO TABS
200.0000 mg | ORAL_TABLET | Freq: Every day | ORAL | Status: DC
  Administered 2018-07-06 – 2018-07-10 (×5): 200 mg
  Filled 2018-07-06 (×5): qty 1

## 2018-07-06 MED ORDER — FAMOTIDINE 20 MG PO TABS
20.0000 mg | ORAL_TABLET | Freq: Two times a day (BID) | ORAL | Status: DC
  Administered 2018-07-06 – 2018-07-10 (×10): 20 mg
  Filled 2018-07-06 (×10): qty 1

## 2018-07-06 MED ORDER — ASPIRIN 81 MG PO CHEW
81.0000 mg | CHEWABLE_TABLET | Freq: Every day | ORAL | Status: DC
  Administered 2018-07-06 – 2018-07-10 (×5): 81 mg
  Filled 2018-07-06 (×5): qty 1

## 2018-07-06 MED ORDER — MOMETASONE FURO-FORMOTEROL FUM 100-5 MCG/ACT IN AERO
2.0000 | INHALATION_SPRAY | Freq: Two times a day (BID) | RESPIRATORY_TRACT | Status: DC
  Administered 2018-07-06 (×2): 2 via RESPIRATORY_TRACT
  Filled 2018-07-06: qty 8.8

## 2018-07-06 NOTE — Consult Note (Signed)
Hematology/Oncology Consult note [facility] Telephone:([phone] [phone]  Patient Care Team: [facility] as PCP - General Earlie Server, MD as Medical Oncologist (Medical Oncology) Lavonia Dana, MD as Consulting Physician (Nephrology) Wilhelmina Mcardle, MD as Consulting Physician (Pulmonary Disease)   Name of the patient: Thomas [name]  [id number]  1951/06/28   Date of visit: 07/06/18 REASON FOR COSULTATION:   History of presenting illness-  67 y.o. male with PMH listed at below who presents to ER for evaluation of shortness of breath. Patient used to follow-up with me for metastatic head and neck cancer.  Last seen by me on 04/02/2018, he received 1 cycle of Keytruda at our cancer center..  After that patient switch his oncology care to [facility] to live with his daughter.  Per daughter, patient was continued on single agent Keytruda.  Recently hospitalized due to aspiration pneumonia, patient reports feeling more shortness of breath, increased oxygen usage over the past couple of days due to increased shortness of breath. Daughter also reports that blood pressure has been low.  His outside facility oncology records were not available to me at this point. Patient was seen and examined at bedside.  Reports shortness of breath.  Is a poor historian.  History obtained from daughter over the phone.   Review of Systems  Unable to perform ROS: Acuity of condition  Constitutional: Positive for fatigue. Negative for chills and fever.  Eyes: Negative for icterus.  Respiratory: Positive for shortness of breath.   Cardiovascular: Negative for chest pain.  Gastrointestinal: Negative for abdominal pain.  Genitourinary: Negative for dysuria.   Musculoskeletal: Negative for neck pain.  Skin: Negative for rash.  Neurological: Negative for dizziness.  Hematological: Positive for adenopathy.  Psychiatric/Behavioral: Negative for  decreased concentration.    No Known Allergies  Patient Active Problem List   Diagnosis Date Noted  . Sepsis ([place]) 07/05/2018  . HCAP (healthcare-associated pneumonia) 07/05/2018  . AKI (acute kidney injury) ([place]) 07/05/2018  . Goals of care, counseling/discussion 03/20/2018  . Hypothyroidism 03/20/2018  . Encephalopathy acute 03/10/2018  . Pressure injury of skin 03/10/2018  . Dysphagia   . Neck mass   . Head and neck cancer ([place]) 02/19/2018  . Cervical lymphadenopathy   . Poor appetite   . Loss of weight   . History of tongue cancer   . Protein-calorie malnutrition, severe 01/22/2016  . Alcohol use disorder, severe, dependence ([place]) 12/12/2014  . Hyponatremia 12/12/2014  . Essential hypertension 12/12/2014  . COPD (chronic obstructive pulmonary disease) ([place]) 12/12/2014     Past Medical History:  Diagnosis Date  . BPH (benign prostatic hyperplasia)   . Chronic dysphagia   . Enlarged prostate   . Hypertension   . Hyponatremia   . 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 Radiation    to neck  . Skin cancer   . Status post chemotherapy   . Tongue cancer ([place])   . Unspecified severe protein-calorie malnutrition ([place])      Past Surgical History:  Procedure Laterality Date  . cancerous lymph nodes removed from neck    . IR FLUORO RM 30-60 MIN  02/25/2018  . KNEE SURGERY Right   . LAPAROSCOPIC INSERTION GASTROSTOMY TUBE N/A 02/27/2018   Procedure: LAPAROSCOPIC INSERTION GASTROSTOMY TUBE;  Surgeon: Benjamine Sprague, DO;  Location: [facility] ORS;  Service: General;  Laterality: N/A;  .  SKIN BIOPSY      Social History   Socioeconomic History  . Marital status: Divorced    Spouse name: Not on file  . Number of children: Not on file  . Years of education: Not on file  . Highest education level: Not on file  Occupational History  . Not on file  Social Needs  . Financial  resource strain: Somewhat hard  . Food insecurity:    Worry: Patient refused    Inability: Patient refused  . Transportation needs:    Medical: Patient refused    Non-medical: Patient refused  Tobacco Use  . Smoking status: Former Smoker    Types: Cigarettes  . Smokeless tobacco: Never Used  . Tobacco comment: stopped smoking about 2 months ago  Substance and Sexual Activity  . Alcohol use: Not Currently    Comment: hasn't had a drink in awhile  . Drug use: No  . Sexual activity: Not Currently  Lifestyle  . Physical activity:    Days per week: Patient refused    Minutes per session: Patient refused  . Stress: Only a little  Relationships  . Social connections:    Talks on phone: Patient refused    Gets together: Patient refused    Attends religious service: Patient refused    Active member of club or organization: Patient refused    Attends meetings of clubs or organizations: Patient refused    Relationship status: Patient refused  . Intimate partner violence:    Fear of current or ex partner: Patient refused    Emotionally abused: Patient refused    Physically abused: Patient refused    Forced sexual activity: Patient refused  Other Topics Concern  . Not on file  Social History Narrative  . Not on file     Family History  Problem Relation Age of Onset  . Hypertension Mother      Current Facility-Administered Medications:  .  acetaminophen (TYLENOL) tablet 650 mg, 650 mg, Oral, Q6H PRN **OR** acetaminophen (TYLENOL) suppository 650 mg, 650 mg, Rectal, Q6H PRN, Lance Coon, MD .  amiodarone (PACERONE) tablet 200 mg, 200 mg, Per Tube, Daily, Lance Coon, MD, 200 mg at 07/06/18 0942 .  aspirin chewable tablet 81 mg, 81 mg, Per Tube, Daily, Lance Coon, MD, 81 mg at 07/06/18 0942 .  ceFEPIme (MAXIPIME) 2 g in sodium chloride 0.9 % 100 mL IVPB, 2 g, Intravenous, Q12H, Lance Coon, MD, Stopped at 07/06/18 1010 .  Chlorhexidine Gluconate Cloth 2 % PADS 6 each, 6  each, Topical, Q0600, Lance Coon, MD, 6 each at 07/06/18 [phone] .  enoxaparin (LOVENOX) injection 40 mg, 40 mg, Subcutaneous, Q24H, Lance Coon, MD .  famotidine (PEPCID) tablet 20 mg, 20 mg, Per Tube, BID, Lance Coon, MD, 20 mg at 07/06/18 0942 .  feeding supplement (JEVITY 1.5 CAL/FIBER) liquid 1,000 mL, 1,000 mL, Per Tube, Continuous, Henreitta Leber, MD, Last Rate: 50 mL/hr at 07/06/18 0951, 1,000 mL at 07/06/18 0951 .  ferrous sulfate 220 (44 Fe) MG/5ML solution 220 mg, 220 mg, Per Tube, Q breakfast, Lance Coon, MD, 220 mg at 07/06/18 1726 .  finasteride (PROSCAR) tablet 5 mg, 5 mg, Oral, QHS, Lance Coon, MD, 5 mg at 07/06/18 0129 .  free water 200 mL, 200 mL, Per Tube, Q6H, Lance Coon, MD, 200 mL at 07/06/18 1400 .  gabapentin (NEURONTIN) capsule 300 mg, 300 mg, Per Tube, TID, Lance Coon, MD, 300 mg at 07/06/18 1726 .  [START ON 07/07/2018] Influenza vac split  quadrivalent PF (FLUZONE HIGH-DOSE) injection 0.5 mL, 0.5 mL, Intramuscular, Tomorrow-1000, Lance Coon, MD .  levothyroxine (SYNTHROID, LEVOTHROID) tablet 100 mcg, 100 mcg, Per Tube, QAC breakfast, Lance Coon, MD, 100 mcg at 07/06/18 [phone] .  mometasone-formoterol (DULERA) 100-5 MCG/ACT inhaler 2 puff, 2 puff, Inhalation, BID, Lance Coon, MD, 2 puff at 07/06/18 [phone] .  morphine (MS CONTIN) 12 hr tablet 15 mg, 15 mg, Oral, Q12H, Lance Coon, MD, 15 mg at 07/06/18 0941 .  mupirocin ointment (BACTROBAN) 2 % 1 application, 1 application, Nasal, BID, Lance Coon, MD, 1 application at 77/41/28 [phone] .  oxyCODONE (Oxy IR/ROXICODONE) immediate release tablet 5 mg, 5 mg, Per Tube, Q6H PRN, Lance Coon, MD .  vancomycin (VANCOCIN) IVPB 1000 mg/200 mL premix, 1,000 mg, Intravenous, Q24H, McShane, James A, MD, Last Rate: 200 mL/hr at 07/06/18 1052, 1,000 mg at 07/06/18 1052 .  venlafaxine [facility]) tablet 37.5 mg, 37.5 mg, Per Tube, BID WC, Lance Coon, MD, 37.5 mg at 07/06/18 1726   Physical exam: ECOG  Vitals:    07/05/18 2349 07/06/18 0345 07/06/18 0457 07/06/18 1455  BP: 132/77 (!) 97/48  (!) 87/54  Pulse: 75 80  82  Resp: 19 16  19   Temp: 98.1 F (36.7 C) 98.4 F (36.9 C)  (!) 97.4 F (36.3 C)  TempSrc: Oral Oral  Oral  SpO2: 95% 96%  95%  Weight: 112 lb 14.4 oz (51.2 kg)  112 lb 10.5 oz (51.1 kg)   Height: 5\' 5"  (1.651 m)      Physical Exam  Constitutional: He is oriented to person, place, and time. No distress.  HENT:  Head: Normocephalic and atraumatic.  Nose: Nose normal.  Mouth/Throat: Oropharynx is clear and moist. No oropharyngeal exudate.  Eyes: Pupils are equal, round, and reactive to light. EOM are normal. No scleral icterus.  Neck: Normal range of motion. Neck supple.  Cardiovascular: Normal rate and regular rhythm.  No murmur heard. Pulmonary/Chest: Effort normal. No respiratory distress.  Right sided rhonchi   Abdominal: Soft. Bowel sounds are normal. He exhibits no distension.  Musculoskeletal: Normal range of motion. He exhibits no edema.  Lymphadenopathy:    He has cervical adenopathy.  Neurological: He is alert and oriented to person, place, and time. No cranial nerve deficit. He exhibits normal muscle tone. Coordination normal.  Skin: Skin is warm and dry. No erythema.  Psychiatric: Affect normal.        CMP Latest Ref Rng & Units 07/06/2018  Glucose 70 - 99 mg/dL 99  BUN 8 - 23 mg/dL 36(H)  Creatinine 0.61 - 1.24 mg/dL 1.22  Sodium 135 - 145 mmol/L 140  Potassium 3.5 - 5.1 mmol/L 4.3  Chloride 98 - 111 mmol/L 108  CO2 22 - 32 mmol/L 24  Calcium 8.9 - 10.3 mg/dL 8.2(L)  Total Protein 6.5 - 8.1 g/dL -  Total Bilirubin 0.3 - 1.2 mg/dL -  Alkaline Phos 38 - 126 U/L -  AST 15 - 41 U/L -  ALT 0 - 44 U/L -   CBC Latest Ref Rng & Units 07/06/2018  WBC 4.0 - 10.5 K/uL 20.3(H)  Hemoglobin 13.0 - 17.0 g/dL 11.8(L)  Hematocrit 39.0 - 52.0 % 39.4  Platelets 150 - 400 K/uL 224   RADIOGRAPHIC STUDIES: I have personally reviewed the radiological images as  listed and agreed with the findings in the report. Dg Chest Port 1 View  Result Date: 07/05/2018 CLINICAL DATA:  Weakness EXAM: PORTABLE CHEST 1 VIEW COMPARISON:  67/20/9470 FINDINGS: Metallic opacities  over the left shoulder. Small left pleural effusion. Hazy bibasilar opacity. Stable heart size with aortic atherosclerosis. No pneumothorax. IMPRESSION: Hazy bibasilar airspace disease which may reflect atelectasis, pneumonia or aspiration. There is trace left pleural effusion Electronically Signed   By: Donavan Foil M.D.   On: 07/05/2018 19:36    Assessment and plan- Patient is a 67 y.o. male with history of metastatic head neck squamous cell carcinoma, recent history of aspiration pneumonia,  on immunotherapy treatment presented for evaluation of shortness of breath, cough.  # HCAP /Sepsis, blood culture and urine culture pending. on broad spectrum IV antibiotics for HCAP.  Immunotherapy induced pneumonitis is in the differential. Recommend obtaining CT chest  Monitor respiratory status, if no improvement, recommend empiric steroids.   # AKI: IV fluid hydration. 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  Will obtain his outside facility oncology care records. Patient can follow up at cancer center for evaluation of restarting treatment after discharge from this hospitalization.  Continue tube feeding.  # RT induced hypothyroidism: continue Synthroid 100 mcg   Thank you for allowing me to participate in the care of this patient.  Total face to face encounter time for this patient visit was min. 70>50% of the time was  spent in counseling and coordination of care.    Earlie Server, MD, PhD Hematology Oncology [facility] Pager- [phone] 07/06/2018

## 2018-07-06 NOTE — Progress Notes (Signed)
SLP Cancellation Note  Patient Details Name: Thomas [name] MRN: [id number] DOB: 11-15-1950   Cancelled treatment:       Reason Eval/Treat Not Completed: Other (comment). Chart reviewed. Patient interviewed. MD consulted. This 67 y/o male presents this admission with weakness, increased SOB, has a hx of aspiration pneumonia secondary to chronic dysphagia, base of tongue cancer, has been undergoing cancer treatment until recently due to recent repeated hospital admissions for declining status.  SLP notes on last admission reveal MBSS on 02/22/2018 revealed "severe oropharyngeal dysphagia characterized by slow, disorganized oral management, delayed swallow pharyngeal initiation, reduced tongue base retraction, reduced hyolaryngeal excursion, moderate-severe pharyngeal residue, and aspiration of pharyngeal residue.  These deficits are consistent with the late effects of radiation on swallowing and are not likely to improve with time.  The patient is at severe risk for aspiration with PO intake." PEG Tube placement was performed on 02/27/18. Pt reports he plans on following up with [facility] for cancer tx. Discussed above with MD, who agreed pt is not appropriate for BSE, pt to continue with NPO status and continue use of PEG tub for nutrtional/hydration needs. Recommend continue with frequent oral care for hygiene and stim of swallow. Nursing updated.    Nimah Uphoff, MA, CCC-SLP 07/06/2018, 10:46 AM

## 2018-07-06 NOTE — Progress Notes (Signed)
at [place] NAME: Thomas [name]    MR#:  [id number]  DATE OF BIRTH:  Jun 23, 1951  SUBJECTIVE:   Patient presents to the hospital due to worsening shortness of breath and cough and suspected to have pneumonia.  Patient is afebrile, hemodynamically stable.  No other acute events overnight.  Patient is on tube feeds and tolerating it well.  REVIEW OF SYSTEMS:    Review of Systems  Constitutional: Negative for chills and fever.  HENT: Negative for congestion and tinnitus.   Eyes: Negative for blurred vision and double vision.  Respiratory: Positive for cough and shortness of breath. Negative for wheezing.   Cardiovascular: Negative for chest pain, orthopnea and PND.  Gastrointestinal: Negative for abdominal pain, diarrhea, nausea and vomiting.  Genitourinary: Negative for dysuria and hematuria.  Neurological: Negative for dizziness, sensory change and focal weakness.  All other systems reviewed and are negative.   Nutrition: tube feeds Tolerating Diet: yes Tolerating PT: Ambulatory  DRUG ALLERGIES:  No Known Allergies  VITALS:  Blood pressure (!) 97/48, pulse 80, temperature 98.4 F (36.9 C), temperature source Oral, resp. rate 16, height 5\' 5"  (1.651 m), weight 51.1 kg, SpO2 96 %.  PHYSICAL EXAMINATION:   Physical Exam  GENERAL:  67 y.o.-year-old thin cachectic patient lying in bed in no acute distress.  EYES: Pupils equal, round, reactive to light and accommodation. No scleral icterus. Extraocular muscles intact.  HEENT: Head atraumatic, normocephalic. Oropharynx and nasopharynx clear.  NECK:  Supple, no jugular venous distention. No thyroid enlargement, no tenderness.  LUNGS: Normal breath sounds bilaterally, no wheezing, rales, coarse upper airway rhonchi. No use of accessory muscles of respiration.  CARDIOVASCULAR: S1, S2 normal. No murmurs, rubs, or gallops.  ABDOMEN: Soft, nontender, nondistended. Bowel sounds  present. No organomegaly or mass. + PEG tube in place.   EXTREMITIES: No cyanosis, clubbing or edema b/l.    NEUROLOGIC: Cranial nerves II through XII are intact. No focal Motor or sensory deficits b/l. Globally weak  PSYCHIATRIC: The patient is alert and oriented x 3.  SKIN: No obvious rash, lesion, or ulcer.    LABORATORY PANEL:   CBC Recent Labs  Lab 07/06/18 0620  WBC 20.3*  HGB 11.8*  HCT 39.4  PLT 224   ------------------------------------------------------------------------------------------------------------------  Chemistries  Recent Labs  Lab 07/05/18 1844 07/06/18 0620  NA 139 140  K 3.4* 4.3  CL 104 108  CO2 30 24  GLUCOSE 117* 99  BUN 42* 36*  CREATININE 1.44* 1.22  CALCIUM 8.1* 8.2*  AST 12*  --   ALT 11  --   ALKPHOS 52  --   BILITOT 0.3  --    ------------------------------------------------------------------------------------------------------------------  Cardiac Enzymes Recent Labs  Lab 07/05/18 1844  TROPONINI <0.03   ------------------------------------------------------------------------------------------------------------------  RADIOLOGY:  Dg Chest Port 1 View  Result Date: 07/05/2018 CLINICAL DATA:  Weakness EXAM: PORTABLE CHEST 1 VIEW COMPARISON:  22/29/7989 FINDINGS: Metallic opacities over the left shoulder. Small left pleural effusion. Hazy bibasilar opacity. Stable heart size with aortic atherosclerosis. No pneumothorax. IMPRESSION: Hazy bibasilar airspace disease which may reflect atelectasis, pneumonia or aspiration. There is trace left pleural effusion Electronically Signed   By: Donavan Foil M.D.   On: 07/05/2018 19:36     ASSESSMENT AND PLAN:   67 year old male with past medical history of tongue cancer, BPH, chronic dysphasia, hypertension, hyponatremia, recent admission for aspiration pneumonia who presents to the hospital due to cough, shortness of breath.  1.  Pneumonia- source of patient's worsening cough and  shortness of breath.   - Patient is presently n.p.o. and gets tube feeds. -Continue IV antibiotics with Vanc, Cefepime for now.  - follow cultures.   2.  Leukocytosis-secondary to pneumonia. -Follow with IV antibiotic therapy.  3.  History of tongue cancer- patient is status post PEG tube placement. -Continue tube feedings. - await oncology input.   4. Chronic Pain - due to malignancy - cont. MS contin, Oxycodone PRN.   5. Hypothyroidism - cont. Synthroid.   6. Neuropathy - cont. Neurontin.   7. BPH - cont. Proscar - no urinary retention.   8. Hx of a. Fib - rate controlled.  - cont. Amiodarone.      All the records are reviewed and case discussed with Care Management/Social Worker. Management plans discussed with the patient, family and they are in agreement.  CODE STATUS: Full code  DVT Prophylaxis: Lovenox  TOTAL TIME TAKING CARE OF THIS PATIENT: 30 minutes.   POSSIBLE D/C IN 1-2 DAYS, DEPENDING ON CLINICAL CONDITION.   Henreitta Leber M.D on 07/06/2018 at 2:23 PM  Between 7am to 6pm - Pager - [phone]  After 6pm go to [email or web address] - Technical brewer [place] [facility]  Office  [phone]  CC: Primary care physician; [facility]

## 2018-07-06 NOTE — Progress Notes (Signed)
Brief Nutrition Note  Consult received for enteral/tube feeding initiation and management.  Per review of chart, pt appears to have been followed by [facility] oncology dietitians as outpatient up until a couple months ago when he transferred care [facility]. While he was receiving Osmolite 1.5 while being treated at [facility], unsure of what TF he is receiving under their care. Will leave as Jevity 1.5 at this time.   Adult Enteral Nutrition Protocol initiated. Full assessment to follow.  Admitting Dx: Dehydration [E86.0]   Labs:  Recent Labs  Lab 07/05/18 1844 07/06/18 0620  NA 139 140  K 3.4* 4.3  CL 104 108  CO2 30 24  BUN 42* 36*  CREATININE 1.44* 1.22  CALCIUM 8.1* 8.2*  GLUCOSE 117* 99    Burtis Junes RD, LDN, CNSC Clinical Nutrition Available Tues-Sat via Pager: [phone] 07/06/2018 8:14 AM

## 2018-07-07 LAB — CBC
HCT: 36.8 % — ABNORMAL LOW (ref 39.0–52.0)
Hemoglobin: 11.2 g/dL — ABNORMAL LOW (ref 13.0–17.0)
MCH: 28.6 pg (ref 26.0–34.0)
MCHC: 30.4 g/dL (ref 30.0–36.0)
MCV: 93.9 fL (ref 80.0–100.0)
Platelets: 220 10*3/uL (ref 150–400)
RBC: 3.92 MIL/uL — ABNORMAL LOW (ref 4.22–5.81)
RDW: 15.9 % — ABNORMAL HIGH (ref 11.5–15.5)
WBC: 19.8 10*3/uL — ABNORMAL HIGH (ref 4.0–10.5)
nRBC: 0 % (ref 0.0–0.2)

## 2018-07-07 LAB — URINE CULTURE: Culture: NO GROWTH

## 2018-07-07 LAB — GLUCOSE, CAPILLARY
GLUCOSE-CAPILLARY: 124 mg/dL — AB (ref 70–99)
Glucose-Capillary: 106 mg/dL — ABNORMAL HIGH (ref 70–99)
Glucose-Capillary: 132 mg/dL — ABNORMAL HIGH (ref 70–99)
Glucose-Capillary: 158 mg/dL — ABNORMAL HIGH (ref 70–99)
Glucose-Capillary: 129 mg/dL — ABNORMAL HIGH (ref 70–99)
Glucose-Capillary: 117 mg/dL — ABNORMAL HIGH (ref 70–99)

## 2018-07-07 LAB — BASIC METABOLIC PANEL
Anion gap: 5 (ref 5–15)
BUN: 25 mg/dL — ABNORMAL HIGH (ref 8–23)
CO2: 29 mmol/L (ref 22–32)
Calcium: 8.4 mg/dL — ABNORMAL LOW (ref 8.9–10.3)
Chloride: 103 mmol/L (ref 98–111)
Creatinine, Ser: 0.83 mg/dL (ref 0.61–1.24)
GFR calc Af Amer: >60 mL/min (ref >60)
Glucose, Bld: 165 mg/dL — ABNORMAL HIGH (ref 70–99)
POTASSIUM: 4.4 mmol/L (ref 3.5–5.1)
Sodium: 137 mmol/L (ref 135–145)

## 2018-07-07 LAB — EXPECTORATED SPUTUM ASSESSMENT W GRAM STAIN, RFLX TO RESP C

## 2018-07-07 LAB — EXPECTORATED SPUTUM ASSESSMENT W REFEX TO RESP CULTURE

## 2018-07-07 MED ORDER — VANCOMYCIN HCL IN DEXTROSE 750-5 MG/150ML-% IV SOLN
750.0000 mg | Freq: Two times a day (BID) | INTRAVENOUS | Status: DC
  Administered 2018-07-08 (×3): 750 mg via INTRAVENOUS
  Filled 2018-07-07 (×5): qty 150

## 2018-07-07 MED ORDER — IPRATROPIUM-ALBUTEROL 0.5-2.5 (3) MG/3ML IN SOLN
3.0000 mL | Freq: Four times a day (QID) | RESPIRATORY_TRACT | Status: DC
  Administered 2018-07-07 – 2018-07-10 (×12): 3 mL via RESPIRATORY_TRACT
  Filled 2018-07-07 (×13): qty 3

## 2018-07-07 MED ORDER — BUDESONIDE 0.5 MG/2ML IN SUSP
0.5000 mg | Freq: Two times a day (BID) | RESPIRATORY_TRACT | Status: DC
  Administered 2018-07-07 – 2018-07-10 (×6): 0.5 mg via RESPIRATORY_TRACT
  Filled 2018-07-07 (×6): qty 2

## 2018-07-07 NOTE — Progress Notes (Addendum)
Hematology/Oncology Progress Note [facility] Telephone:([phone] [phone]  Patient Care Team: [facility] as PCP - General Earlie Server, MD as Medical Oncologist (Medical Oncology) Lavonia Dana, MD as Consulting Physician (Nephrology) Wilhelmina Mcardle, MD as Consulting Physician (Pulmonary Disease)   Name of the patient: Thomas [name]  [id number]  [birth year]  Date of visit: 07/07/18   INTERVAL HISTORY-  Patient was seen and examined at bedside. Daughter is not at bedside.  Patient is sleeping. Opened his eyes for a few seconds and went back to sleep.   Review of systems- Review of Systems  Unable to perform ROS: Other  Sleeping.   No Known Allergies  Patient Active Problem List   Diagnosis Date Noted  . Sepsis ([place]) 07/05/2018  . HCAP (healthcare-associated pneumonia) 07/05/2018  . AKI (acute kidney injury) ([place]) 07/05/2018  . Goals of care, counseling/discussion 03/20/2018  . Hypothyroidism 03/20/2018  . Encephalopathy acute 03/10/2018  . Pressure injury of skin 03/10/2018  . Dysphagia   . Neck mass   . Head and neck cancer ([place]) 02/19/2018  . Cervical lymphadenopathy   . Poor appetite   . Loss of weight   . History of tongue cancer   . Protein-calorie malnutrition, severe 01/22/2016  . Alcohol use disorder, severe, dependence ([place]) 12/12/2014  . Hyponatremia 12/12/2014  . Essential hypertension 12/12/2014  . COPD (chronic obstructive pulmonary disease) ([place]) 12/12/2014     Past Medical History:  Diagnosis Date  . BPH (benign prostatic hyperplasia)   . Chronic dysphagia   . Enlarged prostate   . Hypertension   . Hyponatremia   . 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 Radiation    to neck  . Skin cancer   . Status post chemotherapy   . Tongue cancer ([place])   . Unspecified severe protein-calorie malnutrition  ([place])      Past Surgical History:  Procedure Laterality Date  . cancerous lymph nodes removed from neck    . IR FLUORO RM 30-60 MIN  02/25/2018  . KNEE SURGERY Right   . LAPAROSCOPIC INSERTION GASTROSTOMY TUBE N/A 02/27/2018   Procedure: LAPAROSCOPIC INSERTION GASTROSTOMY TUBE;  Surgeon: Benjamine Sprague, DO;  Location: [facility] ORS;  Service: General;  Laterality: N/A;  . SKIN BIOPSY      Social History   Socioeconomic History  . Marital status: Divorced    Spouse name: Not on file  . Number of children: Not on file  . Years of education: Not on file  . Highest education level: Not on file  Occupational History  . Not on file  Social Needs  . Financial resource strain: Somewhat hard  . Food insecurity:    Worry: Patient refused    Inability: Patient refused  . Transportation needs:    Medical: Patient refused    Non-medical: Patient refused  Tobacco Use  . Smoking status: Former Smoker    Types: Cigarettes  . Smokeless tobacco: Never Used  . Tobacco comment: stopped smoking about 2 months ago  Substance and Sexual Activity  . Alcohol use: Not Currently    Comment: hasn't had a drink in awhile  . Drug use: No  . Sexual activity: Not Currently  Lifestyle  . Physical activity:    Days per week: Patient refused    Minutes per session: Patient refused  . Stress: Only a little  Relationships  .  Social connections:    Talks on phone: Patient refused    Gets together: Patient refused    Attends religious service: Patient refused    Active member of club or organization: Patient refused    Attends meetings of clubs or organizations: Patient refused    Relationship status: Patient refused  . Intimate partner violence:    Fear of current or ex partner: Patient refused    Emotionally abused: Patient refused    Physically abused: Patient refused    Forced sexual activity: Patient refused  Other Topics Concern  . Not on file  Social History Narrative  . Not on file       Family History  Problem Relation Age of Onset  . Hypertension Mother      Current Facility-Administered Medications:  .  0.9 %  sodium chloride infusion, , Intravenous, PRN, Henreitta Leber, MD, Stopped at 07/06/18 2200 .  acetaminophen (TYLENOL) tablet 650 mg, 650 mg, Oral, Q6H PRN **OR** acetaminophen (TYLENOL) suppository 650 mg, 650 mg, Rectal, Q6H PRN, Lance Coon, MD .  amiodarone (PACERONE) tablet 200 mg, 200 mg, Per Tube, Daily, Lance Coon, MD, 200 mg at 07/07/18 1054 .  aspirin chewable tablet 81 mg, 81 mg, Per Tube, Daily, Lance Coon, MD, 81 mg at 07/07/18 1054 .  budesonide (PULMICORT) nebulizer solution 0.5 mg, 0.5 mg, Nebulization, BID, Sainani, Vivek J, MD .  ceFEPIme (MAXIPIME) 2 g in sodium chloride 0.9 % 100 mL IVPB, 2 g, Intravenous, Q12H, Lance Coon, MD, Last Rate: 200 mL/hr at 07/07/18 1059, 2 g at 07/07/18 1059 .  Chlorhexidine Gluconate Cloth 2 % PADS 6 each, 6 each, Topical, Q0600, Lance Coon, MD, 6 each at 07/07/18 0600 .  enoxaparin (LOVENOX) injection 40 mg, 40 mg, Subcutaneous, Q24H, Lance Coon, MD, 40 mg at 07/06/18 2109 .  famotidine (PEPCID) tablet 20 mg, 20 mg, Per Tube, BID, Lance Coon, MD, 20 mg at 07/07/18 1054 .  feeding supplement (JEVITY 1.5 CAL/FIBER) liquid 1,000 mL, 1,000 mL, Per Tube, Continuous, Sainani, Belia Heman, MD, Last Rate: 50 mL/hr at 07/07/18 0125, 1,000 mL at 07/07/18 0125 .  ferrous sulfate 220 (44 Fe) MG/5ML solution 220 mg, 220 mg, Per Tube, Q breakfast, Lance Coon, MD, 220 mg at 07/07/18 1055 .  finasteride (PROSCAR) tablet 5 mg, 5 mg, Oral, QHS, Lance Coon, MD, 5 mg at 07/06/18 2107 .  free water 200 mL, 200 mL, Per Tube, Q6H, Lance Coon, MD, 200 mL at 07/07/18 0800 .  gabapentin (NEURONTIN) capsule 300 mg, 300 mg, Per Tube, TID, Lance Coon, MD, 300 mg at 07/07/18 1055 .  Influenza vac split quadrivalent PF (FLUZONE HIGH-DOSE) injection 0.5 mL, 0.5 mL, Intramuscular, Tomorrow-1000, Willis, David, MD .   ipratropium-albuterol (DUONEB) 0.5-2.5 (3) MG/3ML nebulizer solution 3 mL, 3 mL, Nebulization, Q6H, Sainani, Vivek J, MD .  levothyroxine (SYNTHROID, LEVOTHROID) tablet 100 mcg, 100 mcg, Per Tube, QAC breakfast, Lance Coon, MD, 100 mcg at 07/07/18 1054 .  morphine (MS CONTIN) 12 hr tablet 15 mg, 15 mg, Oral, Q12H, Lance Coon, MD, 15 mg at 07/07/18 1054 .  mupirocin ointment (BACTROBAN) 2 % 1 application, 1 application, Nasal, BID, Lance Coon, MD, 1 application at 74/25/95 1055 .  oxyCODONE (Oxy IR/ROXICODONE) immediate release tablet 5 mg, 5 mg, Per Tube, Q6H PRN, Lance Coon, MD .  vancomycin (VANCOCIN) IVPB 1000 mg/200 mL premix, 1,000 mg, Intravenous, Q24H, McShane, James A, MD, Last Rate: 200 mL/hr at 07/07/18 1138, 1,000 mg at 07/07/18 1138 .  venlafaxine [facility]) tablet 37.5 mg, 37.5 mg, Per Tube, BID WC, Lance Coon, MD, 37.5 mg at 07/07/18 1054   Physical exam:  Vitals:   07/06/18 1921 07/07/18 0500 07/07/18 0554 07/07/18 0555  BP: (!) 139/58  (!) 144/56   Pulse: 84  (!) 126 (!) 118  Resp:      Temp: 98.2 F (36.8 C)     TempSrc: Oral     SpO2: 94%  (!) 82% 90%  Weight:  121 lb 1.6 oz (54.9 kg)    Height:       Physical Exam  Constitutional: No distress.  HENT:  Head: Normocephalic.  Mouth/Throat: No oropharyngeal exudate.  Eyes: No scleral icterus.  Neck: Neck supple.  Cardiovascular: Normal rate and regular rhythm.  Pulmonary/Chest: Effort normal and breath sounds normal.  Abdominal: Soft. Bowel sounds are normal.  PEG tube in place.   Musculoskeletal: Normal range of motion.  Lymphadenopathy:    He has cervical adenopathy.  Neurological:  sleeping  Skin: Skin is warm.       CMP Latest Ref Rng & Units 07/07/2018  Glucose 70 - 99 mg/dL 165(H)  BUN 8 - 23 mg/dL 25(H)  Creatinine 0.61 - 1.24 mg/dL 0.83  Sodium 135 - 145 mmol/L 137  Potassium 3.5 - 5.1 mmol/L 4.4  Chloride 98 - 111 mmol/L 103  CO2 22 - 32 mmol/L 29  Calcium 8.9 - 10.3 mg/dL 8.4(L)   Total Protein 6.5 - 8.1 g/dL -  Total Bilirubin 0.3 - 1.2 mg/dL -  Alkaline Phos 38 - 126 U/L -  AST 15 - 41 U/L -  ALT 0 - 44 U/L -   CBC Latest Ref Rng & Units 07/07/2018  WBC 4.0 - 10.5 K/uL 19.8(H)  Hemoglobin 13.0 - 17.0 g/dL 11.2(L)  Hematocrit 39.0 - 52.0 % 36.8(L)  Platelets 150 - 400 K/uL 220   RADIOGRAPHIC STUDIES: I have personally reviewed the radiological images as listed and agreed with the findings in the report.  Ct Chest Wo Contrast  Result Date: 07/06/2018 CLINICAL DATA:  Inpatient. Worsening cough. History of head neck cancer. EXAM: CT CHEST WITHOUT CONTRAST TECHNIQUE: Multidetector CT imaging of the chest was performed following the standard protocol without IV contrast. COMPARISON:  Chest radiograph from one day prior. 03/18/2018 PET-CT. FINDINGS: Cardiovascular: Normal heart size. No significant pericardial effusion/thickening. Three-vessel coronary atherosclerosis. Atherosclerotic nonaneurysmal thoracic aorta. Normal caliber pulmonary arteries. Mediastinum/Nodes: No discrete thyroid nodules. Small fluid level in the upper thoracic esophagus. No pathologically enlarged axillary, mediastinal or hilar lymph nodes, noting limited sensitivity for the detection of hilar adenopathy on this noncontrast study. Lungs/Pleura: No pneumothorax. Small dependent bilateral pleural effusions, left greater than right. Moderate centrilobular and paraseptal emphysema with diffuse bronchial wall thickening. There is moderate consolidation with air bronchograms in the dependent basilar left lower lobe with some volume loss. There is similar but less prominent bandlike consolidation in the dependent basilar right lower lobe with air bronchograms. There is extensive patchy nodular peribronchovascular consolidation and ground-glass opacity throughout the bilateral lungs involving all lung lobes, with relative sparing of the upper lobes. These findings are largely new since 03/18/2018 PET-CT.  Previously described 9 mm nodular focus of consolidation in the right middle lobe on 03/18/2018 PET-CT is not discretely visualized on this scan. Previously described 2.0 cm cavitary nodular focus of consolidation in the anterior right upper lobe on 03/18/2018 PET-CT is decreased to the 0.6 cm (series 3/image 39) and demonstrates no residual cavitary change. No lung masses. Upper abdomen:  Nonobstructive 7 mm interpolar left renal stone. Percutaneous gastrostomy tube appears well positioned in the anterior body of the stomach. Musculoskeletal: No aggressive appearing focal osseous lesions. Mild thoracic spondylosis. IMPRESSION: 1. Extensive patchy nodular peribronchovascular consolidation and ground-glass opacity throughout both lungs with relative sparing of the upper lobes. More coalescent consolidation and air bronchograms at both lung bases, left greater than right. These findings are largely new since 03/18/2018 PET-CT and are most compatible with multilobar pneumonia. 2. Small dependent bilateral pleural effusions, left greater than right. 3. Previously described cavitary right upper lobe pulmonary nodule from 03/18/2018 PET-CT is decreased. Previously described solid right middle lobe pulmonary nodule is absent. Presumably, these are resolving inflammatory nodules. 4. Fluid in the upper thoracic esophagus, suggesting esophageal dysmotility and/or gastroesophageal reflux. 5. Three-vessel coronary atherosclerosis. 6. Nonobstructing left nephrolithiasis. Aortic Atherosclerosis (ICD10-I70.0) and Emphysema (ICD10-J43.9). Electronically Signed   By: Ilona Sorrel M.D.   On: 07/06/2018 20:16   Dg Chest Port 1 View  Result Date: 07/05/2018 CLINICAL DATA:  Weakness EXAM: PORTABLE CHEST 1 VIEW COMPARISON:  03/47/4259 FINDINGS: Metallic opacities over the left shoulder. Small left pleural effusion. Hazy bibasilar opacity. Stable heart size with aortic atherosclerosis. No pneumothorax. IMPRESSION: Hazy bibasilar  airspace disease which may reflect atelectasis, pneumonia or aspiration. There is trace left pleural effusion Electronically Signed   By: Donavan Foil M.D.   On: 07/05/2018 19:36    Assessment and plan-  Patient is a 67 y.o. 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  # Pneumonia CT image was independently reviewed, consistent with extensive patchy nodular peri bronchovasular consolidation and ground glass opacity throughout both lungs. Left greater than left. Compatible with multi lobar pneumonia.  Continue IV antibiotics with Cefepime and Vanomycin.   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 Follow up outpatient.   #continue Tube feeding. Aspiration precaution.  # Anemia, counts have improved comparing to his hemoglobin level in August.  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   Discussed plan with Dr.Sainani.  Thank you for allowing me to participate in the care of this patient.   Earlie Server, MD, PhD Hematology Oncology [facility] Pager- [phone] 07/07/2018

## 2018-07-07 NOTE — Progress Notes (Signed)
Initial Nutrition Assessment  DOCUMENTATION CODES:   Severe malnutrition in context of chronic illness  INTERVENTION:  Continue Jevity 1.5 Cal at 50 mL/hr over 24 hours via G-tube. Provides 1800 kcal, 77 grams of protein, 26 grams of fiber, 912 mL H2O daily.  Continue free water flush of 200 mL Q6hrs per G-tube. Provides a total of 1712 mL H2O daily including water in tube feeding.  Goal TF regimen meets 100% RDIs for vitamins/minerals so no further vitamin/mineral supplementation needed at this time as patient's wounds have now healed.  NUTRITION DIAGNOSIS:   Severe Malnutrition related to chronic illness(recurrent stage IV SCC head and neck, COPD, CKD, hx EtOH abuse, dysphagia) as evidenced by moderate-severe fat depletion, moderate-severe muscle depletion.  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 Patient now admitted with PNA, leukocytosis.   -Patient remains NPO due to dysphagia, risk of aspiration.  Met with patient at bedside. He is a poor historian but he does report he is tolerating his tube feeds. He was discharged from [facility] since last seen by an RD at [facility] and he and his daughter moved to a different city so he is actually being followed by a different Oncologist now and those records are not available. Therefore, he was  discharged from our outpatient RD at [facility]. Spoke with patient's daughter over the phone. She reports he is tolerating tube feeds well at home. He receives Nutren 1.5 Cal 5 cans per day. She pours the cans in a bag and provides via pump at 60 mL/hr over 20 hours (she holds TFs for 4 hours in AM). Nutren comes in a fiber formula and fiber-free formula, but she is unsure which one he is receiving. He is currently tolerating Jevity, which is a fiber-containing formula so will continue this for now as fiber is beneficial long-term. Will provide over 24 hours here to reduce confusion. Patient is receiving free water flush of 200 mL Q6hrs and daughter reports he is tolerating that well. On previous admission patient was experiencing hyponatremia but currently sodium is WNL. Patient previously had a stage I pressure ulcer to his back and a stage II pressure ulcer to his buttocks but these have now healed. Noticeable improvement on his NFPE, though he still meets criteria for severe malnutrition. If patient continues to gain weight he may be able to be downgraded to moderate malnutrition within a few months.  Patient is unsure of his weight history. Per chart he was 61.2 kg on 11/23/2016, 49.9 kg on 02/27/2018, 45.3 kg on 03/20/2018, 47.6 kg on 03/28/2018, and 54.9 kg on 07/07/2018 (121.1 lbs). Weight is trending up slowly.  Enteral Access: 20 Fr. G-tube (this is a replacement tube as patient's original tube was a 16 Fr. Kangaroo G-tube)  Medications reviewed and include: famotidine 20 mg BID per tube, ferrous sulfate  220 mg daily per tube, free water flush 200 mL Q6hrs, gabapentin, levothyroxine 100 mcg daily per tube, cefepime, vancomycin.  Labs reviewed: CBG 75-158, BUN 25. Sodium is WNL.  Discussed with RN.  NUTRITION - FOCUSED PHYSICAL EXAM:    Most Recent Value  Orbital Region  Moderate depletion  Upper Arm Region  Severe depletion  Thoracic and Lumbar Region  Severe depletion  Buccal  Region  Moderate depletion  Temple Region  Moderate depletion  Clavicle Bone Region  Moderate depletion  Clavicle and Acromion Bone Region  Severe depletion  Scapular Bone Region  Severe depletion  Dorsal Hand  Moderate depletion  Patellar Region  Severe depletion  Anterior Thigh Region  Severe depletion  Posterior Calf Region  Severe depletion  Edema (RD Assessment)  None  Hair  Reviewed  Eyes  Reviewed  Mouth  Reviewed  Skin  Reviewed  Nails  Reviewed     Diet Order:   Diet Order            Diet NPO time specified  Diet effective now             EDUCATION NEEDS:   No education needs have been identified at this time  Skin:  Skin Assessment: Skin Integrity Issues:(MSAD to bilateral buttocks)  Last BM:  07/07/2018 per chart  Height:   Ht Readings from Last 1 Encounters:  07/05/18 5' 5"  (1.651 m)   Weight:   Wt Readings from Last 1 Encounters:  07/07/18 54.9 kg   Ideal Body Weight:  61.8 kg  BMI:  Body mass index is 20.15 kg/m.  Estimated Nutritional Needs:   Kcal:  0634-9494 (MSJ x 1.3-1.5)  Protein:  70-82 grams (1.3-1.5 grams/kg)  Fluid:  1.6 L/day (30 mL/kg)  Willey Blade, MS, RD, LDN Office: [phone] Pager: [phone] After Hours/Weekend Pager: [phone]

## 2018-07-07 NOTE — Progress Notes (Signed)
Pharmacy Antibiotic Note  Thomas [name] is a 67 y.o. male admitted on 07/05/2018 with sepsis.  Pharmacy has been consulted for Cefepime and vancomycin dosing.  Plan: Renal function has improved significantly since admission. Empirically adjust vancomycin to 750 mg IV Q12H, predicted trough 19 mcg/ml. Pharmacy will continue to follow and adjust as needed to maintain trough 15 to 20 mcg/ml.   Continue cefepime 2 gm IV Q12H.   Height: 5\' 5"  (165.1 cm) Weight: 121 lb 1.6 oz (54.9 kg) IBW/kg (Calculated) : 61.5  Temp (24hrs), Avg:97.8 F (36.6 C), Min:97.4 F (36.3 C), Max:98.2 F (36.8 C)  Recent Labs  Lab 07/05/18 1844 07/05/18 1849 07/06/18 0620 07/07/18 0707  WBC 19.8*  --  20.3* 19.8*  CREATININE 1.44*  --  1.22 0.83  LATICACIDVEN  --  0.99  --   --     Estimated Creatinine Clearance: 66.1 mL/min (by C-G formula based on SCr of 0.83 mg/dL).    No Known Allergies  Antimicrobials this admission:   >>    >>   Dose adjustments this admission:   Microbiology results: 11/29 BCx: pending  11/29 UCx:  pending  Sputum:    MRSA PCR:   Thank you for allowing pharmacy to be a part of this patient's care.  Laural Benes 07/07/2018 1:06 PM

## 2018-07-07 NOTE — Progress Notes (Signed)
[facility] NAME: Thomas [name]    MR#:  [id number]  DATE OF BIRTH:  [birth year]  SUBJECTIVE:   Patient presents to the hospital due to worsening shortness of breath and cough and suspected to have pneumonia.  Underwent CT of the chest yesterday which was suggestive of multilobar pneumonia.  Patient still having some cough with productive sputum.  White cell count is improving.  Afebrile.  REVIEW OF SYSTEMS:    Review of Systems  Constitutional: Negative for chills and fever.  HENT: Negative for congestion and tinnitus.   Eyes: Negative for blurred vision and double vision.  Respiratory: Positive for cough and shortness of breath. Negative for wheezing.   Cardiovascular: Negative for chest pain, orthopnea and PND.  Gastrointestinal: Negative for abdominal pain, diarrhea, nausea and vomiting.  Genitourinary: Negative for dysuria and hematuria.  Neurological: Negative for dizziness, sensory change and focal weakness.  All other systems reviewed and are negative.   Nutrition: tube feeds Tolerating Diet: yes Tolerating PT: Ambulatory  DRUG ALLERGIES:  No Known Allergies  VITALS:  Blood pressure (!) 144/56, pulse (!) 118, temperature 98.2 F (36.8 C), temperature source Oral, resp. rate 19, height 5\' 5"  (1.651 m), weight 54.9 kg, SpO2 90 %.  PHYSICAL EXAMINATION:   Physical Exam  GENERAL:  67 y.o.-year-old thin cachectic patient lying in bed in no acute distress.  EYES: Pupils equal, round, reactive to light and accommodation. No scleral icterus. Extraocular muscles intact.  HEENT: Head atraumatic, normocephalic. Oropharynx and nasopharynx clear.  NECK:  Supple, no jugular venous distention. No thyroid enlargement, no tenderness.  LUNGS: Normal breath sounds bilaterally, no wheezing, rales, coarse upper airway rhonchi. No use of accessory muscles of respiration.  CARDIOVASCULAR: S1, S2 normal. No murmurs, rubs, or gallops.    ABDOMEN: Soft, nontender, nondistended. Bowel sounds present. No organomegaly or mass. + PEG tube in place.   EXTREMITIES: No cyanosis, clubbing or edema b/l.    NEUROLOGIC: Cranial nerves II through XII are intact. No focal Motor or sensory deficits b/l. Globally weak  PSYCHIATRIC: The patient is alert and oriented x 3.  SKIN: No obvious rash, lesion, or ulcer.    LABORATORY PANEL:   CBC Recent Labs  Lab 07/07/18 0707  WBC 19.8*  HGB 11.2*  HCT 36.8*  PLT 220   ------------------------------------------------------------------------------------------------------------------  Chemistries  Recent Labs  Lab 07/05/18 1844  07/07/18 0707  NA 139   < > 137  K 3.4*   < > 4.4  CL 104   < > 103  CO2 30   < > 29  GLUCOSE 117*   < > 165*  BUN 42*   < > 25*  CREATININE 1.44*   < > 0.83  CALCIUM 8.1*   < > 8.4*  AST 12*  --   --   ALT 11  --   --   ALKPHOS 52  --   --   BILITOT 0.3  --   --    < > = values in this interval not displayed.   ------------------------------------------------------------------------------------------------------------------  Cardiac Enzymes Recent Labs  Lab 07/05/18 1844  TROPONINI <0.03   ------------------------------------------------------------------------------------------------------------------  RADIOLOGY:  Ct Chest Wo Contrast  Result Date: 07/06/2018 CLINICAL DATA:  Inpatient. Worsening cough. History of head neck cancer. EXAM: CT CHEST WITHOUT CONTRAST TECHNIQUE: Multidetector CT imaging of the chest was performed following the standard protocol without IV contrast. COMPARISON:  Chest radiograph from one day prior. 03/18/2018  PET-CT. FINDINGS: Cardiovascular: Normal heart size. No significant pericardial effusion/thickening. Three-vessel coronary atherosclerosis. Atherosclerotic nonaneurysmal thoracic aorta. Normal caliber pulmonary arteries. Mediastinum/Nodes: No discrete thyroid nodules. Small fluid level in the upper thoracic  esophagus. No pathologically enlarged axillary, mediastinal or hilar lymph nodes, noting limited sensitivity for the detection of hilar adenopathy on this noncontrast study. Lungs/Pleura: No pneumothorax. Small dependent bilateral pleural effusions, left greater than right. Moderate centrilobular and paraseptal emphysema with diffuse bronchial wall thickening. There is moderate consolidation with air bronchograms in the dependent basilar left lower lobe with some volume loss. There is similar but less prominent bandlike consolidation in the dependent basilar right lower lobe with air bronchograms. There is extensive patchy nodular peribronchovascular consolidation and ground-glass opacity throughout the bilateral lungs involving all lung lobes, with relative sparing of the upper lobes. These findings are largely new since 03/18/2018 PET-CT. Previously described 9 mm nodular focus of consolidation in the right middle lobe on 03/18/2018 PET-CT is not discretely visualized on this scan. Previously described 2.0 cm cavitary nodular focus of consolidation in the anterior right upper lobe on 03/18/2018 PET-CT is decreased to the 0.6 cm (series 3/image 39) and demonstrates no residual cavitary change. No lung masses. Upper abdomen: Nonobstructive 7 mm interpolar left renal stone. Percutaneous gastrostomy tube appears well positioned in the anterior body of the stomach. Musculoskeletal: No aggressive appearing focal osseous lesions. Mild thoracic spondylosis. IMPRESSION: 1. Extensive patchy nodular peribronchovascular consolidation and ground-glass opacity throughout both lungs with relative sparing of the upper lobes. More coalescent consolidation and air bronchograms at both lung bases, left greater than right. These findings are largely new since 03/18/2018 PET-CT and are most compatible with multilobar pneumonia. 2. Small dependent bilateral pleural effusions, left greater than right. 3. Previously described cavitary  right upper lobe pulmonary nodule from 03/18/2018 PET-CT is decreased. Previously described solid right middle lobe pulmonary nodule is absent. Presumably, these are resolving inflammatory nodules. 4. Fluid in the upper thoracic esophagus, suggesting esophageal dysmotility and/or gastroesophageal reflux. 5. Three-vessel coronary atherosclerosis. 6. Nonobstructing left nephrolithiasis. Aortic Atherosclerosis (ICD10-I70.0) and Emphysema (ICD10-J43.9). Electronically Signed   By: Ilona Sorrel M.D.   On: 07/06/2018 20:16   Dg Chest Port 1 View  Result Date: 07/05/2018 CLINICAL DATA:  Weakness EXAM: PORTABLE CHEST 1 VIEW COMPARISON:  65/68/1275 FINDINGS: Metallic opacities over the left shoulder. Small left pleural effusion. Hazy bibasilar opacity. Stable heart size with aortic atherosclerosis. No pneumothorax. IMPRESSION: Hazy bibasilar airspace disease which may reflect atelectasis, pneumonia or aspiration. There is trace left pleural effusion Electronically Signed   By: Donavan Foil M.D.   On: 07/05/2018 19:36     ASSESSMENT AND PLAN:   67 year old male with past medical history of tongue cancer, BPH, chronic dysphasia, hypertension, hyponatremia, recent admission for aspiration pneumonia who presents to the hospital due to cough, shortness of breath.  1.  Pneumonia- source of patient's worsening cough and shortness of breath.   -Patient underwent CT chest noncontrast which is suggestive of multilobar pneumonia. - Continue IV vancomycin, cefepime, await sputum cultures. - Patient is presently n.p.o. and gets tube feeds.  2.  Leukocytosis-secondary to pneumonia. - trending down with IV abx.    3.  History of recurrent Head & Neck Cancer- patient is status post PEG tube placement. -Patient had switched care to another oncologist but wants to switch back to Dr. Tasia Catchings locally.   -Continue tube feedings. Appreciate Oncology input.   4. Chronic Pain - due to malignancy - cont. MS contin, Oxycodone  PRN.  5. Hypothyroidism - cont. Synthroid.   6. Neuropathy - cont. Neurontin.   7. BPH - cont. Proscar - no urinary retention.   8. Hx of a. Fib - rate controlled.  - cont. Amiodarone.   All the records are reviewed and case discussed with Care Management/Social Worker. Management plans discussed with the patient, family and they are in agreement.  CODE STATUS: Full code  DVT Prophylaxis: Lovenox  TOTAL TIME TAKING CARE OF THIS PATIENT: 30 minutes.   POSSIBLE D/C IN 1-2 DAYS, DEPENDING ON CLINICAL CONDITION.   Henreitta Leber M.D on 07/07/2018 at 2:08 PM  Between 7am to 6pm - Pager - [phone]  After 6pm go to [email or web address] - Technical brewer  Hospitalists  Office  [phone]  CC: Primary care physician; [facility]

## 2018-07-08 LAB — GLUCOSE, CAPILLARY
GLUCOSE-CAPILLARY: 97 mg/dL (ref 70–99)
Glucose-Capillary: 132 mg/dL — ABNORMAL HIGH (ref 70–99)
Glucose-Capillary: 115 mg/dL — ABNORMAL HIGH (ref 70–99)
Glucose-Capillary: 101 mg/dL — ABNORMAL HIGH (ref 70–99)
Glucose-Capillary: 89 mg/dL (ref 70–99)

## 2018-07-08 LAB — CBC
HCT: 32.7 % — ABNORMAL LOW (ref 39.0–52.0)
Hemoglobin: 10 g/dL — ABNORMAL LOW (ref 13.0–17.0)
MCH: 28.7 pg (ref 26.0–34.0)
MCHC: 30.6 g/dL (ref 30.0–36.0)
MCV: 94 fL (ref 80.0–100.0)
Platelets: 179 10*3/uL (ref 150–400)
RBC: 3.48 MIL/uL — ABNORMAL LOW (ref 4.22–5.81)
RDW: 15.7 % — ABNORMAL HIGH (ref 11.5–15.5)
WBC: 17.8 10*3/uL — ABNORMAL HIGH (ref 4.0–10.5)
nRBC: 0 % (ref 0.0–0.2)

## 2018-07-08 LAB — EXPECTORATED SPUTUM ASSESSMENT W GRAM STAIN, RFLX TO RESP C

## 2018-07-08 LAB — EXPECTORATED SPUTUM ASSESSMENT W REFEX TO RESP CULTURE

## 2018-07-08 MED ORDER — DOCUSATE SODIUM 50 MG/5ML PO LIQD
100.0000 mg | Freq: Two times a day (BID) | ORAL | Status: DC
  Administered 2018-07-09 – 2018-07-10 (×3): 100 mg
  Filled 2018-07-08 (×5): qty 10

## 2018-07-08 NOTE — Care Management Note (Signed)
Case Management Note  Patient Details  Name: DELIO SLATES MRN: [id number] Date of Birth: 1950-09-01  Subjective/Objective:    Admitted to [facility] with the diagnosis of sepsis. Lives with daughter, Langley [facility] [phone]). Last seen at [facility] 06/05/2018. States she just went to [place] last Wednesday and moved her father to her home. Will be arranging pharmacy care at [facility].  Currently having home health per [facility] (RN, PT, OT speech). [facility] 03/2018. Chronic home oxygen per [facility] in [place] [phone]) Tube feeds per [facility] since July 2019. Shower chair, bedside commode, rolling walker, wheelchair, and hospital bed. In the home.  States her father does get up during the day.                 Action/Plan: Followed by [facility]   Expected Discharge Date:                  Expected Discharge Plan:     In-House Referral:   yes  Discharge planning Services   yes  Post Acute Care Choice:   [facility] already in place. Choice offered to:     DME Arranged:    DME Agency:     HH Arranged:    HH Agency:     Status of Service:     If discussed at [employer], dates discussed:    Additional Comments:  Shelbie Ammons, RN MSN CCM Care Management [phone] 07/08/2018, 1:06 PM

## 2018-07-08 NOTE — Progress Notes (Signed)
Hematology/Oncology Progress Note [facility] [phone] [phone]  Patient Care Team: [facility] as PCP - General Earlie Server, MD as Medical Oncologist (Medical Oncology) Lavonia Dana, MD as Consulting Physician (Nephrology) Wilhelmina Mcardle, MD as Consulting Physician (Pulmonary Disease)   Name of the patient: Thomas [name]  [id number]  05/19/1951  Date of visit: 07/08/18   INTERVAL HISTORY-  Patient was seen and examined.  Breath comfortable on room air. No family members at bedside.   Review of systems- Review of Systems  Constitutional: Positive for fatigue. Negative for fever.  HENT:   Positive for hearing loss.   Eyes: Negative for eye problems.  Respiratory: Positive for cough. Negative for shortness of breath and wheezing.   Cardiovascular: Negative for chest pain.  Gastrointestinal: Negative for abdominal distention and abdominal pain.  Genitourinary: Negative for bladder incontinence and difficulty urinating.   Skin: Negative for rash.  Neurological: Negative for light-headedness.  Psychiatric/Behavioral: Negative for decreased concentration.    No Known Allergies  Patient Active Problem List   Diagnosis Date Noted  . Sepsis ([place]) 07/05/2018  . HCAP (healthcare-associated pneumonia) 07/05/2018  . AKI (acute kidney injury) ([place]) 07/05/2018  . Goals of care, counseling/discussion 03/20/2018  . Hypothyroidism 03/20/2018  . Encephalopathy acute 03/10/2018  . Pressure injury of skin 03/10/2018  . Dysphagia   . Neck mass   . Head and neck cancer ([place]) 02/19/2018  . Cervical lymphadenopathy   . Poor appetite   . Loss of weight   . History of tongue cancer   . Protein-calorie malnutrition, severe 01/22/2016  . Alcohol use disorder, severe, dependence ([place]) 12/12/2014  . Hyponatremia 12/12/2014  . Essential hypertension 12/12/2014  . COPD (chronic obstructive pulmonary disease) ([place]) 12/12/2014      Past Medical History:  Diagnosis Date  . BPH (benign prostatic hyperplasia)   . Chronic dysphagia   . Enlarged prostate   . Hypertension   . Hyponatremia   . 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 Radiation    to neck  . Skin cancer   . Status post chemotherapy   . Tongue cancer ([place])   . Unspecified severe protein-calorie malnutrition ([place])      Past Surgical History:  Procedure Laterality Date  . cancerous lymph nodes removed from neck    . IR FLUORO RM 30-60 MIN  02/25/2018  . KNEE SURGERY Right   . LAPAROSCOPIC INSERTION GASTROSTOMY TUBE N/A 02/27/2018   Procedure: LAPAROSCOPIC INSERTION GASTROSTOMY TUBE;  Surgeon: Benjamine Sprague, DO;  Location: [facility] ORS;  Service: General;  Laterality: N/A;  . SKIN BIOPSY      Social History   Socioeconomic History  . Marital status: Divorced    Spouse name: Not on file  . Number of children: Not on file  . Years of education: Not on file  . Highest education level: Not on file  Occupational History  . Not on file  Social Needs  . Financial resource strain: Somewhat hard  . Food insecurity:    Worry: Patient refused    Inability: Patient refused  . Transportation needs:    Medical: Patient refused    Non-medical: Patient refused  Tobacco Use  . Smoking status: Former Smoker    Types: Cigarettes  . Smokeless tobacco: Never Used  . Tobacco comment: stopped smoking about 2 months ago  Substance and Sexual Activity  .  Alcohol use: Not Currently    Comment: hasn't had a drink in awhile  . Drug use: No  . Sexual activity: Not Currently  Lifestyle  . Physical activity:    Days per week: Patient refused    Minutes per session: Patient refused  . Stress: Only a little  Relationships  . Social connections:    Talks on phone: Patient refused    Gets together: Patient refused    Attends religious service: Patient refused     Active member of club or organization: Patient refused    Attends meetings of clubs or organizations: Patient refused    Relationship status: Patient refused  . Intimate partner violence:    Fear of current or ex partner: Patient refused    Emotionally abused: Patient refused    Physically abused: Patient refused    Forced sexual activity: Patient refused  Other Topics Concern  . Not on file  Social History Narrative  . Not on file     Family History  Problem Relation Age of Onset  . Hypertension Mother      Current Facility-Administered Medications:  .  0.9 %  sodium chloride infusion, , Intravenous, PRN, Henreitta Leber, MD, Stopped at 07/08/18 0241 .  acetaminophen (TYLENOL) tablet 650 mg, 650 mg, Oral, Q6H PRN **OR** acetaminophen (TYLENOL) suppository 650 mg, 650 mg, Rectal, Q6H PRN, Lance Coon, MD .  amiodarone (PACERONE) tablet 200 mg, 200 mg, Per Tube, Daily, Lance Coon, MD, 200 mg at 07/08/18 [phone] .  aspirin chewable tablet 81 mg, 81 mg, Per Tube, Daily, Lance Coon, MD, 81 mg at 07/08/18 2536 .  budesonide (PULMICORT) nebulizer solution 0.5 mg, 0.5 mg, Nebulization, BID, Henreitta Leber, MD, 0.5 mg at 07/08/18 0744 .  ceFEPIme (MAXIPIME) 2 g in sodium chloride 0.9 % 100 mL IVPB, 2 g, Intravenous, Q12H, Lance Coon, MD, Stopped at 07/08/18 1000 .  Chlorhexidine Gluconate Cloth 2 % PADS 6 each, 6 each, Topical, Q0600, Lance Coon, MD, 6 each at 07/08/18 0550 .  docusate (COLACE) 50 MG/5ML liquid 100 mg, 100 mg, Per Tube, BID, Sainani, Vivek J, MD .  enoxaparin (LOVENOX) injection 40 mg, 40 mg, Subcutaneous, Q24H, Lance Coon, MD, 40 mg at 07/07/18 2216 .  famotidine (PEPCID) tablet 20 mg, 20 mg, Per Tube, BID, Lance Coon, MD, 20 mg at 07/08/18 6440 .  feeding supplement (JEVITY 1.5 CAL/FIBER) liquid 1,000 mL, 1,000 mL, Per Tube, Continuous, Sainani, Belia Heman, MD, Last Rate: 50 mL/hr at 07/07/18 0125, 1,000 mL at 07/07/18 0125 .  ferrous sulfate 220 (44 Fe)  MG/5ML solution 220 mg, 220 mg, Per Tube, Q breakfast, Lance Coon, MD, 220 mg at 07/08/18 [phone] .  finasteride (PROSCAR) tablet 5 mg, 5 mg, Oral, QHS, Lance Coon, MD, 5 mg at 07/07/18 2207 .  free water 200 mL, 200 mL, Per Tube, Q6H, Lance Coon, MD, 200 mL at 07/08/18 1400 .  gabapentin (NEURONTIN) capsule 300 mg, 300 mg, Per Tube, TID, Lance Coon, MD, 300 mg at 07/08/18 1818 .  ipratropium-albuterol (DUONEB) 0.5-2.5 (3) MG/3ML nebulizer solution 3 mL, 3 mL, Nebulization, Q6H, Sainani, Belia Heman, MD, 3 mL at 07/08/18 1403 .  levothyroxine (SYNTHROID, LEVOTHROID) tablet 100 mcg, 100 mcg, Per Tube, QAC breakfast, Lance Coon, MD, 100 mcg at 07/08/18 [phone] .  morphine (MS CONTIN) 12 hr tablet 15 mg, 15 mg, Oral, Q12H, Lance Coon, MD, 15 mg at 07/08/18 [phone] .  mupirocin ointment (BACTROBAN) 2 % 1 application, 1 application, Nasal, BID, Willis,  Shanon Brow, MD, 1 application at 49/70/26 [phone] .  oxyCODONE (Oxy IR/ROXICODONE) immediate release tablet 5 mg, 5 mg, Per Tube, Q6H PRN, Lance Coon, MD .  vancomycin (VANCOCIN) IVPB 750 mg/150 ml premix, 750 mg, Intravenous, Q12H, Henreitta Leber, MD, Stopped at 07/08/18 0930 .  venlafaxine [facility]) tablet 37.5 mg, 37.5 mg, Per Tube, BID WC, Lance Coon, MD, 37.5 mg at 07/08/18 1818   Physical exam:  Vitals:   07/08/18 0149 07/08/18 0500 07/08/18 0548 07/08/18 1538  BP:   114/65 137/67  Pulse:   86 84  Resp:    16  Temp:   97.7 F (36.5 C) (!) 97.3 F (36.3 C)  TempSrc:   Axillary Oral  SpO2: 93%  98% 98%  Weight:  107 lb 14.4 oz (48.9 kg)    Height:       Physical Exam  Constitutional: He is oriented to person, place, and time. No distress.  HENT:  Head: Normocephalic.  Eyes: Pupils are equal, round, and reactive to light. EOM are normal. No scleral icterus.  Neck: Normal range of motion. Neck supple.  Cardiovascular: Normal rate and regular rhythm.  No murmur heard. Pulmonary/Chest: Effort normal and breath sounds normal. No  respiratory distress. He has no rales. He exhibits no tenderness.  Abdominal: Soft. Bowel sounds are normal. He exhibits no distension.  PEG tube in place.   Musculoskeletal: Normal range of motion. He exhibits no edema.  Lymphadenopathy:    He has cervical adenopathy.  Neurological: He is alert and oriented to person, place, and time.  sleeping  Skin: Skin is warm and dry. He is not diaphoretic. No erythema.  Psychiatric: Affect normal.       CMP Latest Ref Rng & Units 07/07/2018  Glucose 70 - 99 mg/dL 165(H)  BUN 8 - 23 mg/dL 25(H)  Creatinine 0.61 - 1.24 mg/dL 0.83  Sodium 135 - 145 mmol/L 137  Potassium 3.5 - 5.1 mmol/L 4.4  Chloride 98 - 111 mmol/L 103  CO2 22 - 32 mmol/L 29  Calcium 8.9 - 10.3 mg/dL 8.4(L)  Total Protein 6.5 - 8.1 g/dL -  Total Bilirubin 0.3 - 1.2 mg/dL -  Alkaline Phos 38 - 126 U/L -  AST 15 - 41 U/L -  ALT 0 - 44 U/L -   CBC Latest Ref Rng & Units 07/08/2018  WBC 4.0 - 10.5 K/uL 17.8(H)  Hemoglobin 13.0 - 17.0 g/dL 10.0(L)  Hematocrit 39.0 - 52.0 % 32.7(L)  Platelets 150 - 400 K/uL 179   RADIOGRAPHIC STUDIES: I have personally reviewed the radiological images as listed and agreed with the findings in the report.  Ct Chest Wo Contrast  Result Date: 07/06/2018 CLINICAL DATA:  Inpatient. Worsening cough. History of head neck cancer. EXAM: CT CHEST WITHOUT CONTRAST TECHNIQUE: Multidetector CT imaging of the chest was performed following the standard protocol without IV contrast. COMPARISON:  Chest radiograph from one day prior. 03/18/2018 PET-CT. FINDINGS: Cardiovascular: Normal heart size. No significant pericardial effusion/thickening. Three-vessel coronary atherosclerosis. Atherosclerotic nonaneurysmal thoracic aorta. Normal caliber pulmonary arteries. Mediastinum/Nodes: No discrete thyroid nodules. Small fluid level in the upper thoracic esophagus. No pathologically enlarged axillary, mediastinal or hilar lymph nodes, noting limited sensitivity for the  detection of hilar adenopathy on this noncontrast study. Lungs/Pleura: No pneumothorax. Small dependent bilateral pleural effusions, left greater than right. Moderate centrilobular and paraseptal emphysema with diffuse bronchial wall thickening. There is moderate consolidation with air bronchograms in the dependent basilar left lower lobe with some volume loss. There is  similar but less prominent bandlike consolidation in the dependent basilar right lower lobe with air bronchograms. There is extensive patchy nodular peribronchovascular consolidation and ground-glass opacity throughout the bilateral lungs involving all lung lobes, with relative sparing of the upper lobes. These findings are largely new since 03/18/2018 PET-CT. Previously described 9 mm nodular focus of consolidation in the right middle lobe on 03/18/2018 PET-CT is not discretely visualized on this scan. Previously described 2.0 cm cavitary nodular focus of consolidation in the anterior right upper lobe on 03/18/2018 PET-CT is decreased to the 0.6 cm (series 3/image 39) and demonstrates no residual cavitary change. No lung masses. Upper abdomen: Nonobstructive 7 mm interpolar left renal stone. Percutaneous gastrostomy tube appears well positioned in the anterior body of the stomach. Musculoskeletal: No aggressive appearing focal osseous lesions. Mild thoracic spondylosis. IMPRESSION: 1. Extensive patchy nodular peribronchovascular consolidation and ground-glass opacity throughout both lungs with relative sparing of the upper lobes. More coalescent consolidation and air bronchograms at both lung bases, left greater than right. These findings are largely new since 03/18/2018 PET-CT and are most compatible with multilobar pneumonia. 2. Small dependent bilateral pleural effusions, left greater than right. 3. Previously described cavitary right upper lobe pulmonary nodule from 03/18/2018 PET-CT is decreased. Previously described solid right middle lobe  pulmonary nodule is absent. Presumably, these are resolving inflammatory nodules. 4. Fluid in the upper thoracic esophagus, suggesting esophageal dysmotility and/or gastroesophageal reflux. 5. Three-vessel coronary atherosclerosis. 6. Nonobstructing left nephrolithiasis. Aortic Atherosclerosis (ICD10-I70.0) and Emphysema (ICD10-J43.9). Electronically Signed   By: Ilona Sorrel M.D.   On: 07/06/2018 20:16   Dg Chest Port 1 View  Result Date: 07/05/2018 CLINICAL DATA:  Weakness EXAM: PORTABLE CHEST 1 VIEW COMPARISON:  03/20/4817 FINDINGS: Metallic opacities over the left shoulder. Small left pleural effusion. Hazy bibasilar opacity. Stable heart size with aortic atherosclerosis. No pneumothorax. IMPRESSION: Hazy bibasilar airspace disease which may reflect atelectasis, pneumonia or aspiration. There is trace left pleural effusion Electronically Signed   By: Donavan Foil M.D.   On: 07/05/2018 19:36    Assessment and plan-  Patient is a 67 y.o. 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  # HCAP CT image was independently reviewed, consistent with extensive patchy nodular peri bronchovasular consolidation and ground glass opacity throughout both lungs. Left greater than left. Compatible with multi lobar pneumonia.  Clinically improving, continue IV antibiotics.  # Leukocytosis due to HCAP, trending down.   # Stage IV head and neck cancer, follow up outpatient.  # Anemia, hemglobine trending down. Continue monitor.  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   Thank you for allowing me to participate in the care of this patient.   Earlie Server, MD, PhD Hematology Oncology [facility] Pager- [phone] 07/08/2018

## 2018-07-08 NOTE — Progress Notes (Signed)
Pt family member at shift change was very concerned about this patient and said that he was not "acting like himself".There was new edema in upper extremities and day shift had notified MD. Vital signs were stable. Blood sugar was checked and it was 117. A full neuro was completed assessment and a second nurse verified the baseline of this patient. During my assessment, patient seemed to be getting more alert and oriented and stayed that way for the rest of the encounter. Told daughter that I think he just needed time to wake up after napping hard during the day. Assured her I would call if anything changed or if I felt concerned.   Continued to monitor the rest of my shift and patient seemed alert and at baseline for previous shifts with him. Will continue to monitor for any changes.

## 2018-07-08 NOTE — Evaluation (Signed)
Physical Therapy Evaluation Patient Details Name: Thomas [name] MRN: [id number] DOB: 03-27-1951 Today's Date: 07/08/2018   History of Present Illness  Pt is a 67 y.o. male presenting to hospital 07/05/18 with weakness and low BP; recent hospitalization for PNA.  Pt admitted with sepsis, HCAP, and AKI.  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  Clinical Impression  Prior to hospital admission, pt reports being ambulatory (used cane vs walker depending on how he was feeling).  Pt lives with his daughter in 1 level home with steps to enter.  Currently pt is modified independent semi-supine to sit; CGA with transfers; and CGA to ambulate short distance in room with single UE support on IV pole.  Therapist limited distance ambulating d/t pt's SOB (O2 sats 86% on 3 L O2 via nasal cannula post ambulation but improved to >92% within a few minutes sitting rest break; nurse notified).  No c/o pain.  Pt would benefit from skilled PT to address noted impairments and functional limitations (see below for any additional details).  Upon hospital discharge, recommend pt discharge with HHPT and SBA with mobility for safety.    Follow Up Recommendations Home health PT;Supervision for mobility/OOB    Equipment Recommendations  Rolling walker with 5" wheels    Recommendations for Other Services OT consult     Precautions / Restrictions Precautions Precautions: Fall Precaution Comments: PEG tube; HOB >30 degrees; NPO Restrictions Weight Bearing Restrictions: No      Mobility  Bed Mobility Overal bed mobility: Modified Independent             General bed mobility comments: semi-supine to sit (HOB elevated) with no difficulties noted  Transfers Overall transfer level: Needs assistance Equipment used: (IV pole) Transfers: Sit to/from Stand Sit to Stand: Min  guard         General transfer comment: strong stand noted  Ambulation/Gait Ambulation/Gait assistance: Min guard Gait Distance (Feet): 15 Feet Assistive device: IV Pole   Gait velocity: decreased   General Gait Details: steady with single UE support on IV pole; decreased B step length/foot clearance/heelstrike  Stairs            Wheelchair Mobility    Modified Rankin (Stroke Patients Only)       Balance Overall balance assessment: Needs assistance Sitting-balance support: No upper extremity supported;Feet supported Sitting balance-Leahy Scale: Good Sitting balance - Comments: steady sitting reaching within BOS   Standing balance support: Single extremity supported Standing balance-Leahy Scale: Poor Standing balance comment: pt requiring at least single UE support for standing balance                             Pertinent Vitals/Pain Pain Assessment: No/denies pain  HR WFL during session.    Home Living Family/patient expects to be discharged to:: Private residence Living Arrangements: Children(Pt's daughter) Available Help at Discharge: Family Type of Home: House Home Access: Stairs to enter Entrance Stairs-Rails: Right;Left;Can reach both Entrance Stairs-Number of Steps: 3 Home Layout: One level Home Equipment: Environmental consultant - 2 wheels;Cane - single point;Wheelchair - manual;Hospital bed;Bedside commode;Shower seat      Prior Function           Comments: Pt reports using SPC vs RW depending on how he is feeling.     Hand Dominance  Extremity/Trunk Assessment   Upper Extremity Assessment Upper Extremity Assessment: Generalized weakness(occasional uncoordinated B UE movement noted but not consistent)    Lower Extremity Assessment Lower Extremity Assessment: Generalized weakness    Cervical / Trunk Assessment Cervical / Trunk Assessment: Normal  Communication   Communication: HOH(Difficult to understand pt intermittently)   Cognition Arousal/Alertness: Awake/alert Behavior During Therapy: WFL for tasks assessed/performed Overall Cognitive Status: Within Functional Limits for tasks assessed                                        General Comments General comments (skin integrity, edema, etc.): PEG tube intact beginning/end of session.  Nursing cleared pt for participation in physical therapy.  Pt agreeable to PT session.    Exercises     Assessment/Plan    PT Assessment Patient needs continued PT services  PT Problem List Decreased strength;Decreased activity tolerance;Decreased balance;Decreased mobility;Decreased knowledge of use of DME;Cardiopulmonary status limiting activity;Decreased knowledge of precautions       PT Treatment Interventions DME instruction;Gait training;Stair training;Functional mobility training;Therapeutic activities;Therapeutic exercise;Balance training;Patient/family education    PT Goals (Current goals can be found in the Care Plan section)  Acute Rehab PT Goals Patient Stated Goal: to go home PT Goal Formulation: With patient Time For Goal Achievement: 07/22/18 Potential to Achieve Goals: Good    Frequency Min 2X/week   Barriers to discharge        Co-evaluation               AM-PAC PT "6 Clicks" Mobility  Outcome Measure Help needed turning from your back to your side while in a flat bed without using bedrails?: None Help needed moving from lying on your back to sitting on the side of a flat bed without using bedrails?: None Help needed moving to and from a bed to a chair (including a wheelchair)?: A Little Help needed standing up from a chair using your arms (e.g., wheelchair or bedside chair)?: A Little Help needed to walk in hospital room?: A Little Help needed climbing 3-5 steps with a railing? : A Lot 6 Click Score: 19    End of Session Equipment Utilized During Treatment: Gait belt;Oxygen(3 L O2 via nasal cannula) Activity  Tolerance: Other (comment)(SOB with ambulation) Patient left: in chair;with call bell/phone within reach;Other (comment)(Contacted nurse to double check chair alarm was set (nurse reported she could see pt from where she was and would double check)) Nurse Communication: Mobility status;Precautions;Other (comment)(O2 sats during session) PT Visit Diagnosis: Other abnormalities of gait and mobility (R26.89);Muscle weakness (generalized) (M62.81);Difficulty in walking, not elsewhere classified (R26.2)    Time: 2725-3664 PT Time Calculation (min) (ACUTE ONLY): 31 min   Charges:   PT Evaluation $PT Eval Low Complexity: 1 Low PT Treatments $Therapeutic Activity: 8-22 mins       Leitha Bleak, PT 07/08/18, 4:14 PM [phone]

## 2018-07-08 NOTE — Progress Notes (Signed)
[facility] at [place] NAME: Thomas [name]    MR#:  [id number]  DATE OF BIRTH:  May 26, 1951  SUBJECTIVE:   Denies any worsening shortness of breath or chest pain.  Remains on broad-spectrum IV antibiotics.  Await sputum culture results.  Tolerating his tube feeds well.  REVIEW OF SYSTEMS:    Review of Systems  Constitutional: Negative for chills and fever.  HENT: Negative for congestion and tinnitus.   Eyes: Negative for blurred vision and double vision.  Respiratory: Positive for cough and shortness of breath. Negative for wheezing.   Cardiovascular: Negative for chest pain, orthopnea and PND.  Gastrointestinal: Negative for abdominal pain, diarrhea, nausea and vomiting.  Genitourinary: Negative for dysuria and hematuria.  Neurological: Negative for dizziness, sensory change and focal weakness.  All other systems reviewed and are negative.   Nutrition: tube feeds Tolerating Diet: yes Tolerating PT: Ambulatory  DRUG ALLERGIES:  No Known Allergies  VITALS:  Blood pressure 114/65, pulse 86, temperature 97.7 F (36.5 C), temperature source Axillary, resp. rate 16, height 5\' 5"  (1.651 m), weight 48.9 kg, SpO2 98 %.  PHYSICAL EXAMINATION:   Physical Exam  GENERAL:  67 y.o.-year-old thin cachectic patient lying in bed in no acute distress.  EYES: Pupils equal, round, reactive to light and accommodation. No scleral icterus. Extraocular muscles intact.  HEENT: Head atraumatic, normocephalic. Oropharynx and nasopharynx clear.  NECK:  Supple, no jugular venous distention. No thyroid enlargement, no tenderness.  LUNGS: Normal breath sounds bilaterally, no wheezing, rales, coarse upper airway rhonchi.  No use of accessory muscles of respiration.  CARDIOVASCULAR: S1, S2 normal. No murmurs, rubs, or gallops.  ABDOMEN: Soft, nontender, nondistended. Bowel sounds present. No organomegaly or mass. + PEG tube in place.   EXTREMITIES: No cyanosis,  clubbing or edema b/l.    NEUROLOGIC: Cranial nerves II through XII are intact. No focal Motor or sensory deficits b/l. Globally weak  PSYCHIATRIC: The patient is alert and oriented x 3.  SKIN: No obvious rash, lesion, or ulcer.    LABORATORY PANEL:   CBC Recent Labs  Lab 07/08/18 0611  WBC 17.8*  HGB 10.0*  HCT 32.7*  PLT 179   ------------------------------------------------------------------------------------------------------------------  Chemistries  Recent Labs  Lab 07/05/18 1844  07/07/18 0707  NA 139   < > 137  K 3.4*   < > 4.4  CL 104   < > 103  CO2 30   < > 29  GLUCOSE 117*   < > 165*  BUN 42*   < > 25*  CREATININE 1.44*   < > 0.83  CALCIUM 8.1*   < > 8.4*  AST 12*  --   --   ALT 11  --   --   ALKPHOS 52  --   --   BILITOT 0.3  --   --    < > = values in this interval not displayed.   ------------------------------------------------------------------------------------------------------------------  Cardiac Enzymes Recent Labs  Lab 07/05/18 1844  TROPONINI <0.03   ------------------------------------------------------------------------------------------------------------------  RADIOLOGY:  Ct Chest Wo Contrast  Result Date: 07/06/2018 CLINICAL DATA:  Inpatient. Worsening cough. History of head neck cancer. EXAM: CT CHEST WITHOUT CONTRAST TECHNIQUE: Multidetector CT imaging of the chest was performed following the standard protocol without IV contrast. COMPARISON:  Chest radiograph from one day prior. 03/18/2018 PET-CT. FINDINGS: Cardiovascular: Normal heart size. No significant pericardial effusion/thickening. Three-vessel coronary atherosclerosis. Atherosclerotic nonaneurysmal thoracic aorta. Normal caliber pulmonary arteries. Mediastinum/Nodes: No discrete  thyroid nodules. Small fluid level in the upper thoracic esophagus. No pathologically enlarged axillary, mediastinal or hilar lymph nodes, noting limited sensitivity for the detection of hilar  adenopathy on this noncontrast study. Lungs/Pleura: No pneumothorax. Small dependent bilateral pleural effusions, left greater than right. Moderate centrilobular and paraseptal emphysema with diffuse bronchial wall thickening. There is moderate consolidation with air bronchograms in the dependent basilar left lower lobe with some volume loss. There is similar but less prominent bandlike consolidation in the dependent basilar right lower lobe with air bronchograms. There is extensive patchy nodular peribronchovascular consolidation and ground-glass opacity throughout the bilateral lungs involving all lung lobes, with relative sparing of the upper lobes. These findings are largely new since 03/18/2018 PET-CT. Previously described 9 mm nodular focus of consolidation in the right middle lobe on 03/18/2018 PET-CT is not discretely visualized on this scan. Previously described 2.0 cm cavitary nodular focus of consolidation in the anterior right upper lobe on 03/18/2018 PET-CT is decreased to the 0.6 cm (series 3/image 39) and demonstrates no residual cavitary change. No lung masses. Upper abdomen: Nonobstructive 7 mm interpolar left renal stone. Percutaneous gastrostomy tube appears well positioned in the anterior body of the stomach. Musculoskeletal: No aggressive appearing focal osseous lesions. Mild thoracic spondylosis. IMPRESSION: 1. Extensive patchy nodular peribronchovascular consolidation and ground-glass opacity throughout both lungs with relative sparing of the upper lobes. More coalescent consolidation and air bronchograms at both lung bases, left greater than right. These findings are largely new since 03/18/2018 PET-CT and are most compatible with multilobar pneumonia. 2. Small dependent bilateral pleural effusions, left greater than right. 3. Previously described cavitary right upper lobe pulmonary nodule from 03/18/2018 PET-CT is decreased. Previously described solid right middle lobe pulmonary nodule is  absent. Presumably, these are resolving inflammatory nodules. 4. Fluid in the upper thoracic esophagus, suggesting esophageal dysmotility and/or gastroesophageal reflux. 5. Three-vessel coronary atherosclerosis. 6. Nonobstructing left nephrolithiasis. Aortic Atherosclerosis (ICD10-I70.0) and Emphysema (ICD10-J43.9). Electronically Signed   By: Ilona Sorrel M.D.   On: 07/06/2018 20:16     ASSESSMENT AND PLAN:   67 year old male with past medical history of tongue cancer, BPH, chronic dysphasia, hypertension, hyponatremia, recent admission for aspiration pneumonia who presents to the hospital due to cough, shortness of breath.  1.  Pneumonia- source of patient's worsening cough and shortness of breath.   - Patient underwent CT chest noncontrast which is suggestive of multilobar pneumonia. - Continue IV vancomycin, cefepime, await sputum cultures. - Patient is presently n.p.o. and gets tube feeds.  2.  Leukocytosis- secondary to pneumonia. - improving with IV abx and will cont. To monitor.   3.  History of recurrent Head & Neck Cancer- patient is status post PEG tube placement. -Patient had switched care to another oncologist but wants to switch back to Dr. Tasia Catchings locally.   -Continue tube feedings. Appreciate Oncology input.   4. Chronic Pain - due to malignancy - cont. MS contin, Oxycodone PRN.   5. Hypothyroidism - cont. Synthroid.   6. Neuropathy - cont. Neurontin.   7. BPH - cont. Proscar - no urinary retention.   8. Hx of a. Fib - rate controlled.  - cont. Amiodarone.   Will get PT eval.    All the records are reviewed and case discussed with Care Management/Social Worker. Management plans discussed with the patient, family and they are in agreement.  CODE STATUS: Full code  DVT Prophylaxis: Lovenox  TOTAL TIME TAKING CARE OF THIS PATIENT: 30 minutes.   POSSIBLE D/C IN 1-2  DAYS, DEPENDING ON CLINICAL CONDITION.   Henreitta Leber M.D on 07/08/2018 at 2:52 PM  Between 7am  to 6pm - Pager - [phone]  After 6pm go to [email or web address] - Technical brewer [facility]  Office  [phone]  CC: Primary care physician; [facility]

## 2018-07-08 NOTE — Care Management Important Message (Signed)
Important Message  Patient Details  Name: SATHVIK TIEDT MRN: [id number] Date of Birth: 12/07/1950   Medicare Important Message Given:  Yes    Shelbie Ammons, RN 07/08/2018, 11:52 AM

## 2018-07-09 LAB — CBC
HCT: 33.1 % — ABNORMAL LOW (ref 39.0–52.0)
Hemoglobin: 10.3 g/dL — ABNORMAL LOW (ref 13.0–17.0)
MCH: 28.6 pg (ref 26.0–34.0)
MCHC: 31.1 g/dL (ref 30.0–36.0)
MCV: 91.9 fL (ref 80.0–100.0)
NRBC: 0 % (ref 0.0–0.2)
Platelets: 195 10*3/uL (ref 150–400)
RBC: 3.6 MIL/uL — ABNORMAL LOW (ref 4.22–5.81)
RDW: 15 % (ref 11.5–15.5)
WBC: 16.1 10*3/uL — ABNORMAL HIGH (ref 4.0–10.5)

## 2018-07-09 LAB — GLUCOSE, CAPILLARY
GLUCOSE-CAPILLARY: 117 mg/dL — AB (ref 70–99)
Glucose-Capillary: 92 mg/dL (ref 70–99)
Glucose-Capillary: 101 mg/dL — ABNORMAL HIGH (ref 70–99)
Glucose-Capillary: 114 mg/dL — ABNORMAL HIGH (ref 70–99)
Glucose-Capillary: 92 mg/dL (ref 70–99)
Glucose-Capillary: 110 mg/dL — ABNORMAL HIGH (ref 70–99)
Glucose-Capillary: 100 mg/dL — ABNORMAL HIGH (ref 70–99)

## 2018-07-09 LAB — VANCOMYCIN, TROUGH: Vancomycin Tr: 28 ug/mL (ref 15–20)

## 2018-07-09 LAB — CREATININE, SERUM
Creatinine, Ser: 0.72 mg/dL (ref 0.61–1.24)
GFR calc non Af Amer: >60 mL/min (ref >60)

## 2018-07-09 MED ORDER — METOPROLOL TARTRATE 50 MG PO TABS
100.0000 mg | ORAL_TABLET | Freq: Two times a day (BID) | ORAL | Status: DC
  Administered 2018-07-09 – 2018-07-10 (×3): 100 mg
  Filled 2018-07-09 (×3): qty 2

## 2018-07-09 MED ORDER — HYDRALAZINE HCL 20 MG/ML IJ SOLN
10.0000 mg | Freq: Four times a day (QID) | INTRAMUSCULAR | Status: DC | PRN
  Administered 2018-07-09: 13:00:00 10 mg via INTRAVENOUS
  Filled 2018-07-09: qty 1

## 2018-07-09 MED ORDER — VANCOMYCIN HCL IN DEXTROSE 750-5 MG/150ML-% IV SOLN
750.0000 mg | INTRAVENOUS | Status: DC
  Administered 2018-07-09: 750 mg via INTRAVENOUS
  Filled 2018-07-09 (×2): qty 150

## 2018-07-09 NOTE — Progress Notes (Addendum)
Pharmacy Antibiotic Note  Thomas [name] is a 67 y.o. male admitted on 07/05/2018 with sepsis.  Pharmacy has been consulted for Cefepime and vancomycin dosing.  Plan: 12/03 Vanc trough 28. Will adjust vancomycin to 750mg  IV every 18 hours. Predicted trough 15.7. Scr stable.l. Pharmacy will continue to follow and adjust as needed to maintain trough 15 to 20 mcg/ml.   Continue cefepime 2 gm IV Q12H.   New Kinetics: Ke: 0.047   T1/2: 15   Vd: 36.8  Height: 5\' 5"  (165.1 cm) Weight: 115 lb 15.4 oz (52.6 kg) IBW/kg (Calculated) : 61.5  Temp (24hrs), Avg:98 F (36.7 C), Min:97.3 F (36.3 C), Max:98.3 F (36.8 C)  Recent Labs  Lab 07/05/18 1844 07/05/18 1849 07/06/18 0620 07/07/18 0707 07/08/18 0611 07/09/18 0917  WBC 19.8*  --  20.3* 19.8* 17.8* 16.1*  CREATININE 1.44*  --  1.22 0.83  --  0.72  LATICACIDVEN  --  0.99  --   --   --   --   VANCOTROUGH  --   --   --   --   --  28*    Estimated Creatinine Clearance: 65.8 mL/min (by C-G formula based on SCr of 0.72 mg/dL).    No Known Allergies  Antimicrobials this admission:  11/29 cefepime >>   11/29 vancomycin  >>   Dose adjustments this admission: 12/3 vancomycin 750mg  IV q12H changed to 750mg  q18hr  Microbiology results: 11/29 BCx: pending  11/29 UCx:  pending 12/2 Resp Cx: GPC  MRSA PCR: positive   Thank you for allowing pharmacy to be a part of this patient's care.  Pernell Dupre, PharmD, BCPS Clinical Pharmacist 07/09/2018 10:38 AM

## 2018-07-09 NOTE — Progress Notes (Signed)
[facility] at [place] NAME: Thomas [name]    MR#:  [id number]  DATE OF BIRTH:  Mar 22, 1951  SUBJECTIVE:   No acute events overnight. Continues to have a cough which is productive.  No chest pain or worsening shortness of breath. Afebrile.   REVIEW OF SYSTEMS:    Review of Systems  Constitutional: Negative for chills and fever.  HENT: Negative for congestion and tinnitus.   Eyes: Negative for blurred vision and double vision.  Respiratory: Positive for cough, sputum production and shortness of breath. Negative for wheezing.   Cardiovascular: Negative for chest pain, orthopnea and PND.  Gastrointestinal: Negative for abdominal pain, diarrhea, nausea and vomiting.  Genitourinary: Negative for dysuria and hematuria.  Neurological: Negative for dizziness, sensory change and focal weakness.  All other systems reviewed and are negative.   Nutrition: tube feeds Tolerating Diet: yes Tolerating PT: Ambulatory  DRUG ALLERGIES:  No Known Allergies  VITALS:  Blood pressure (!) 183/87, pulse 73, temperature 98.2 F (36.8 C), temperature source Oral, resp. rate (!) 22, height 5\' 5"  (1.651 m), weight 52.6 kg, SpO2 100 %.  PHYSICAL EXAMINATION:   Physical Exam  GENERAL:  67 y.o.-year-old thin cachectic patient lying in bed in no acute distress.  EYES: Pupils equal, round, reactive to light and accommodation. No scleral icterus. Extraocular muscles intact.  HEENT: Head atraumatic, normocephalic. Oropharynx and nasopharynx clear.  NECK:  Supple, no jugular venous distention. No thyroid enlargement, no tenderness.  LUNGS: Good A/E b/l,  no wheezing, rales, diffuse rhonchi b/l.  No use of accessory muscles of respiration.  CARDIOVASCULAR: S1, S2 normal. No murmurs, rubs, or gallops.  ABDOMEN: Soft, nontender, nondistended. Bowel sounds present. No organomegaly or mass. + PEG tube in place.   EXTREMITIES: No cyanosis, clubbing or edema b/l.      NEUROLOGIC: Cranial nerves II through XII are intact. No focal Motor or sensory deficits b/l. Globally weak  PSYCHIATRIC: The patient is alert and oriented x 3.  SKIN: No obvious rash, lesion, or ulcer.    LABORATORY PANEL:   CBC Recent Labs  Lab 07/09/18 0917  WBC 16.1*  HGB 10.3*  HCT 33.1*  PLT 195   ------------------------------------------------------------------------------------------------------------------  Chemistries  Recent Labs  Lab 07/05/18 1844  07/07/18 0707 07/09/18 0917  NA 139   < > 137  --   K 3.4*   < > 4.4  --   CL 104   < > 103  --   CO2 30   < > 29  --   GLUCOSE 117*   < > 165*  --   BUN 42*   < > 25*  --   CREATININE 1.44*   < > 0.83 0.72  CALCIUM 8.1*   < > 8.4*  --   AST 12*  --   --   --   ALT 11  --   --   --   ALKPHOS 52  --   --   --   BILITOT 0.3  --   --   --    < > = values in this interval not displayed.   ------------------------------------------------------------------------------------------------------------------  Cardiac Enzymes Recent Labs  Lab 07/05/18 1844  TROPONINI <0.03   ------------------------------------------------------------------------------------------------------------------  RADIOLOGY:  No results found.   ASSESSMENT AND PLAN:   67 year old male with past medical history of tongue cancer, BPH, chronic dysphasia, hypertension, hyponatremia, recent admission for aspiration pneumonia who presents to the hospital due to  cough, shortness of breath.  1.  Pneumonia- source of patient's worsening cough and shortness of breath.   - Patient underwent CT chest noncontrast which was suggestive of multilobar pneumonia. - Continue IV vancomycin, cefepime and pt. Is slowly improving.  - cont. Duonebs, pulmicort nebs and pulm. Toileting.  WBC count trending down.  - follow sputum Cx which are (-) so far.   2.  Leukocytosis- secondary to pneumonia. - improving with IV abx and will cont. To monitor.   3.   History of recurrent Head & Neck Cancer- patient is status post PEG tube placement. -Patient had switched care to another oncologist but wants to switch back to Dr. Tasia Catchings locally.   -Continue tube feedings. Appreciate Oncology input.   4. Chronic Pain - due to malignancy - cont. MS contin, Oxycodone PRN.   5. Hypothyroidism - cont. Synthroid.   6. Neuropathy - cont. Neurontin.   7. BPH - cont. Proscar - no urinary retention.   8. Hx of a. Fib - rate controlled.  - cont. Amiodarone.   he had physical therapy evaluation and they recommend home health services.  Likely discharge home with home health next 1 to 2 days.  All the records are reviewed and case discussed with Care Management/Social Worker. Management plans discussed with the patient, family and they are in agreement.  CODE STATUS: Full code  DVT Prophylaxis: Lovenox  TOTAL TIME TAKING CARE OF THIS PATIENT: 68 minutes.   POSSIBLE D/C IN 1-2 DAYS, DEPENDING ON CLINICAL CONDITION.   Henreitta Leber M.D on 07/09/2018 at 3:06 PM  Between 7am to 6pm - Pager - [phone]  After 6pm go to [email or web address] - Technical brewer [facility]  Office  [phone]  CC: Primary care physician; [facility]

## 2018-07-10 LAB — CBC
HCT: 34.1 % — ABNORMAL LOW (ref 39.0–52.0)
Hemoglobin: 10.6 g/dL — ABNORMAL LOW (ref 13.0–17.0)
MCH: 28.5 pg (ref 26.0–34.0)
MCHC: 31.1 g/dL (ref 30.0–36.0)
MCV: 91.7 fL (ref 80.0–100.0)
Platelets: 220 10*3/uL (ref 150–400)
RBC: 3.72 MIL/uL — ABNORMAL LOW (ref 4.22–5.81)
RDW: 15 % (ref 11.5–15.5)
WBC: 13.1 10*3/uL — ABNORMAL HIGH (ref 4.0–10.5)
nRBC: 0 % (ref 0.0–0.2)

## 2018-07-10 LAB — GLUCOSE, CAPILLARY
Glucose-Capillary: 104 mg/dL — ABNORMAL HIGH (ref 70–99)
Glucose-Capillary: 121 mg/dL — ABNORMAL HIGH (ref 70–99)
Glucose-Capillary: 82 mg/dL (ref 70–99)
Glucose-Capillary: 98 mg/dL (ref 70–99)

## 2018-07-10 LAB — CULTURE, BLOOD (ROUTINE X 2)
CULTURE: NO GROWTH
CULTURE: NO GROWTH
SPECIAL REQUESTS: ADEQUATE

## 2018-07-10 LAB — PROCALCITONIN: Procalcitonin: 0.19 ng/mL

## 2018-07-10 MED ORDER — DOXYCYCLINE HYCLATE 100 MG PO TABS: 100.0000 mg | ORAL_TABLET | Freq: Two times a day (BID) | ORAL | 0 refills | Status: AC

## 2018-07-10 MED ORDER — DOXYCYCLINE HYCLATE 100 MG PO TABS
100.0000 mg | ORAL_TABLET | Freq: Two times a day (BID) | ORAL | Status: DC
  Administered 2018-07-10: 100 mg
  Filled 2018-07-10 (×2): qty 1

## 2018-07-10 MED ORDER — MORPHINE SULFATE 10 MG/5ML PO SOLN
10.0000 mg | Freq: Three times a day (TID) | ORAL | Status: DC
  Administered 2018-07-10: 10 mg
  Filled 2018-07-10: qty 5

## 2018-07-10 MED ORDER — ONDANSETRON 8 MG PO TBDP: 8.0000 mg | ORAL_TABLET | Freq: Two times a day (BID) | ORAL | 0 refills | Status: AC | PRN

## 2018-07-10 MED ORDER — POLYETHYLENE GLYCOL 3350 17 G PO PACK: 17.0000 g | PACK | Freq: Every day | ORAL | 0 refills | Status: AC | PRN

## 2018-07-10 MED ORDER — MAGNESIUM OXIDE 400 MG PO TABS: 400.0000 mg | ORAL_TABLET | Freq: Every day | ORAL | Status: AC

## 2018-07-10 MED ORDER — AMIODARONE HCL 200 MG PO TABS: 200.0000 mg | ORAL_TABLET | Freq: Every day | ORAL | 1 refills | Status: AC

## 2018-07-10 MED ORDER — MORPHINE SULFATE 10 MG/5ML PO SOLN: 10.0000 mg | Freq: Three times a day (TID) | ORAL | 0 refills | Status: AC

## 2018-07-10 MED ORDER — FAMOTIDINE 20 MG PO TABS: 20.0000 mg | ORAL_TABLET | Freq: Two times a day (BID) | ORAL | Status: AC

## 2018-07-10 MED ORDER — ASPIRIN 81 MG PO CHEW: 81.0000 mg | CHEWABLE_TABLET | Freq: Every day | ORAL | 2 refills | Status: AC

## 2018-07-10 MED ORDER — OXYCODONE HCL 5 MG PO TABS: 5.0000 mg | ORAL_TABLET | Freq: Four times a day (QID) | ORAL | 0 refills | Status: AC | PRN

## 2018-07-10 MED ORDER — DOXYCYCLINE CALCIUM 50 MG/5ML PO SYRP
100.0000 mg | ORAL_SOLUTION | Freq: Two times a day (BID) | ORAL | Status: DC
  Filled 2018-07-10 (×2): qty 10

## 2018-07-10 MED ORDER — LEVOTHYROXINE SODIUM 100 MCG PO TABS: 100.0000 ug | ORAL_TABLET | Freq: Every day | ORAL | Status: AC

## 2018-07-10 MED ORDER — ADULT MULTIVITAMIN W/MINERALS CH: 1.0000 | ORAL_TABLET | Freq: Every day | ORAL | Status: AC

## 2018-07-10 MED ORDER — ALBUTEROL SULFATE (2.5 MG/3ML) 0.083% IN NEBU: 2.5000 mg | INHALATION_SOLUTION | RESPIRATORY_TRACT | Status: DC | PRN

## 2018-07-10 MED ORDER — GABAPENTIN 300 MG PO CAPS: 300.0000 mg | ORAL_CAPSULE | Freq: Three times a day (TID) | ORAL | Status: AC

## 2018-07-10 MED ORDER — GUAIFENESIN-DM 100-10 MG/5ML PO SYRP: 5.0000 mL | ORAL_SOLUTION | ORAL | 0 refills | Status: AC | PRN

## 2018-07-10 MED ORDER — IPRATROPIUM-ALBUTEROL 0.5-2.5 (3) MG/3ML IN SOLN
3.0000 mL | Freq: Three times a day (TID) | RESPIRATORY_TRACT | Status: DC
  Administered 2018-07-10: 16:00:00 3 mL via RESPIRATORY_TRACT
  Filled 2018-07-10: qty 3

## 2018-07-10 NOTE — Discharge Summary (Signed)
[facility] NAME: Thomas [name]    MR#:  [id number]  DATE OF BIRTH:  24-Sep-1950  DATE OF ADMISSION:  07/05/2018   ADMITTING PHYSICIAN: Lance Coon, MD  DATE OF DISCHARGE: 07/10/2018 PRIMARY CARE PHYSICIAN: [place], [facility]   ADMISSION DIAGNOSIS:  Dehydration [E86.0] DISCHARGE DIAGNOSIS:  Principal Problem:   Sepsis ([place]) Active Problems:   Essential hypertension   COPD (chronic obstructive pulmonary disease) ([place])   Hypothyroidism   HCAP (healthcare-associated pneumonia)   AKI (acute kidney injury) ([place])  SECONDARY DIAGNOSIS:   Past Medical History:  Diagnosis Date  . BPH (benign prostatic hyperplasia)   . Chronic dysphagia   . Enlarged prostate   . Hypertension   . Hyponatremia   . 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 Radiation    to neck  . Skin cancer   . Status post chemotherapy   . Tongue cancer ([place])   . Unspecified severe protein-calorie malnutrition [facility])    HOSPITAL COURSE:  67 year old male with past medical history of tongue cancer, BPH, chronic dysphasia, hypertension, hyponatremia, recent admission for aspiration pneumonia who presents to the hospital due to cough, shortness of breath.  1.  Pneumonia- source of patient's worsening cough and shortness of breath.   - Patient underwent CT chest noncontrast which was suggestive of multilobar pneumonia. He has been treated wtih IV vancomycin, cefepime and is improving.  Change to doxycycline for 3 more days. - cont. Duonebs, pulmicort nebs and pulm. Toileting.  WBC count trending down.  - follow sputum Cx which are (-) so far.   2.  Leukocytosis- secondary to pneumonia. - improving with IV abx.  3.  History of recurrent Head & Neck Cancer- patient is status post PEG tube placement. -Patient had switched care to another oncologist but wants  to switch back to Dr. Tasia Catchings locally.   -Continue tube feedings.  Follow-up with Dr. Tasia Catchings as outpatient.  4. Chronic Pain - due to malignancy - cont. MS contin, Oxycodone PRN.   5. Hypothyroidism - cont. Synthroid.   6. Neuropathy - cont. Neurontin.   7. BPH - cont. Proscar - no urinary retention.   8. Hx of a. Fib - rate controlled.  - cont. Amiodarone.  DISCHARGE CONDITIONS:  Stable, discharge to home with HHPT today. CONSULTS OBTAINED:   DRUG ALLERGIES:  No Known Allergies DISCHARGE MEDICATIONS:   Allergies as of 07/10/2018   No Known Allergies     Medication List    STOP taking these medications   acetaminophen 650 MG CR tablet Commonly known as:  TYLENOL   acidophilus Caps capsule   aspirin EC 81 MG tablet Replaced by:  aspirin 81 MG chewable tablet   diclofenac 75 MG EC tablet Commonly known as:  VOLTAREN   fentaNYL 12 MCG/HR Commonly known as:  DURAGESIC - dosed mcg/hr   guaifenesin 400 MG Tabs tablet Commonly known as:  HUMIBID E   morphine 30 MG 12 hr tablet Commonly known as:  MS CONTIN Replaced by:  morphine 10 MG/5ML solution   prochlorperazine 10 MG tablet Commonly known as:  COMPAZINE   sennosides 8.8 MG/5ML syrup Commonly known as:  SENOKOT   sodium chloride 1 g tablet     TAKE these medications   amiodarone 200 MG tablet Commonly known as:  PACERONE Place 1 tablet (200 mg total) into feeding  tube daily. Start taking on:  07/11/2018 What changed:  how to take this   aspirin 81 MG chewable tablet Place 1 tablet (81 mg total) into feeding tube daily. Start taking on:  07/11/2018 Replaces:  aspirin EC 81 MG tablet   budesonide-formoterol 80-4.5 MCG/ACT inhaler Commonly known as:  SYMBICORT Inhale 2 puffs into the lungs 2 (two) times daily.   cloNIDine 0.1 mg/24hr patch Commonly known as:  CATAPRES - Dosed in mg/24 hr Place 1 patch (0.1 mg total) onto the skin once a week.   docusate 50 MG/5ML liquid Commonly known as:   COLACE Place 10 mLs (100 mg total) into feeding tube daily as needed for mild constipation.   doxycycline 100 MG tablet Commonly known as:  VIBRA-TABS Place 1 tablet (100 mg total) into feeding tube every 12 (twelve) hours.   famotidine 20 MG tablet Commonly known as:  PEPCID Place 1 tablet (20 mg total) into feeding tube 2 (two) times daily. What changed:  how to take this   feeding supplement (OSMOLITE 1.5 CAL) Liqd Place 1,000 mLs into feeding tube daily. At 50 cc/hr   ferrous sulfate 75 (15 Fe) MG/ML Soln Commonly known as:  FER-IN-SOL Place 4 mLs (60 mg of iron total) into feeding tube 2 (two) times daily.   finasteride 5 MG tablet Commonly known as:  PROSCAR Take 5 mg by mouth at bedtime.   fluticasone 50 MCG/ACT nasal spray Commonly known as:  FLONASE Place 1 spray into both nostrils daily.   free water Soln Place 20 mLs into feeding tube every 4 (four) hours.   gabapentin 300 MG capsule Commonly known as:  NEURONTIN Place 1 capsule (300 mg total) into feeding tube 3 (three) times daily. What changed:  how to take this   guaiFENesin-dextromethorphan 100-10 MG/5ML syrup Commonly known as:  ROBITUSSIN DM Place 5 mLs into feeding tube every 4 (four) hours as needed for cough.   levothyroxine 100 MCG tablet Commonly known as:  SYNTHROID, LEVOTHROID Place 1 tablet (100 mcg total) into feeding tube daily. What changed:  how to take this   magnesium oxide 400 MG tablet Commonly known as:  MAG-OX Place 1 tablet (400 mg total) into feeding tube daily. What changed:  how to take this   metoprolol tartrate 100 MG tablet Commonly known as:  LOPRESSOR Place 1 tablet (100 mg total) into feeding tube 2 (two) times daily.   morphine 10 MG/5ML solution Place 5 mLs (10 mg total) into feeding tube every 8 (eight) hours. Replaces:  morphine 30 MG 12 hr tablet   multivitamin with minerals Tabs tablet Place 1 tablet into feeding tube daily. What changed:  how to take  this   ondansetron 8 MG disintegrating tablet Commonly known as:  ZOFRAN-ODT Place 1 tablet (8 mg total) into feeding tube every 12 (twelve) hours as needed. What changed:    how to take this  reasons to take this   oxyCODONE 5 MG immediate release tablet Commonly known as:  Oxy IR/ROXICODONE Take 5 mg by mouth every 6 (six) hours. What changed:  Another medication with the same name was added. Make sure you understand how and when to take each.   oxyCODONE 5 MG immediate release tablet Commonly known as:  Oxy IR/ROXICODONE Place 1 tablet (5 mg total) into feeding tube every 6 (six) hours as needed for moderate pain or breakthrough pain. What changed:  You were already taking a medication with the same name, and this prescription was added.  Make sure you understand how and when to take each.   polyethylene glycol packet Commonly known as:  MIRALAX / GLYCOLAX Place 17 g into feeding tube daily as needed for moderate constipation or severe constipation. What changed:  how to take this   venlafaxine 37.5 MG tablet Commonly known as:  EFFEXOR Place 1 tablet (37.5 mg total) into feeding tube 2 (two) times daily with a meal.        DISCHARGE INSTRUCTIONS:  See AVS.  If you experience worsening of your admission symptoms, develop shortness of breath, life threatening emergency, suicidal or homicidal thoughts you must seek medical attention immediately by calling 911 or calling your MD immediately  if symptoms less severe.  You Must read complete instructions/literature along with all the possible adverse reactions/side effects for all the Medicines you take and that have been prescribed to you. Take any new Medicines after you have completely understood and accpet all the possible adverse reactions/side effects.   Please note  You were cared for by a hospitalist during your hospital stay. If you have any questions about your discharge medications or the care you received while you  were in the hospital after you are discharged, you can call the unit and asked to speak with the hospitalist on call if the hospitalist that took care of you is not available. Once you are discharged, your primary care physician will handle any further medical issues. Please note that NO REFILLS for any discharge medications will be authorized once you are discharged, as it is imperative that you return to your primary care physician (or establish a relationship with a primary care physician if you do not have one) for your aftercare needs so that they can reassess your need for medications and monitor your lab values.    On the day of Discharge:  VITAL SIGNS:  Blood pressure (!) 169/85, pulse 80, temperature 97.7 F (36.5 C), temperature source Oral, resp. rate 18, height 5\' 5"  (1.651 m), weight 52.3 kg, SpO2 (!) 85 %. PHYSICAL EXAMINATION:  GENERAL:  67 y.o.-year-old patient lying in the bed with no acute distress.  EYES: Pupils equal, round, reactive to light and accommodation. No scleral icterus. Extraocular muscles intact.  HEENT: Head atraumatic, normocephalic. NECK:  Supple, no jugular venous distention. No thyroid enlargement, no tenderness.  LUNGS: Normal breath sounds bilaterally, no wheezing, rales,rhonchi or crepitation. No use of accessory muscles of respiration.  CARDIOVASCULAR: S1, S2 normal. No murmurs, rubs, or gallops.  ABDOMEN: Soft, non-tender, non-distended. Bowel sounds present. No organomegaly or mass.  PEG in situ. EXTREMITIES: No pedal edema, cyanosis, or clubbing.  NEUROLOGIC: Cranial nerves II through XII are intact. Muscle strength 4/5 in all extremities. Sensation intact. Gait not checked.  PSYCHIATRIC: The patient is alert and oriented x 3.  SKIN: No obvious rash, lesion, or ulcer.  DATA REVIEW:   CBC Recent Labs  Lab 07/10/18 0454  WBC 13.1*  HGB 10.6*  HCT 34.1*  PLT 220    Chemistries  Recent Labs  Lab 07/05/18 1844  07/07/18 0707 07/09/18 0917   NA 139   < > 137  --   K 3.4*   < > 4.4  --   CL 104   < > 103  --   CO2 30   < > 29  --   GLUCOSE 117*   < > 165*  --   BUN 42*   < > 25*  --   CREATININE 1.44*   < >  0.83 0.72  CALCIUM 8.1*   < > 8.4*  --   AST 12*  --   --   --   ALT 11  --   --   --   ALKPHOS 52  --   --   --   BILITOT 0.3  --   --   --    < > = values in this interval not displayed.     Microbiology Results  Results for orders placed or performed during the hospital encounter of 07/05/18  Blood Culture (routine x 2)     Status: None   Collection Time: 07/05/18  6:44 PM  Result Value Ref Range Status   Specimen Description BLOOD LEFT ANTECUBITAL  Final   Special Requests   Final    BOTTLES DRAWN AEROBIC AND ANAEROBIC Blood Culture results may not be optimal due to an excessive volume of blood received in culture bottles   Culture   Final    NO GROWTH 5 DAYS Performed at [facility], [address] [place], [place] [address]    Report Status 07/10/2018 FINAL  Final  Blood Culture (routine x 2)     Status: None   Collection Time: 07/05/18  6:44 PM  Result Value Ref Range Status   Specimen Description BLOOD RIGHT ANTECUBITAL  Final   Special Requests   Final    BOTTLES DRAWN AEROBIC AND ANAEROBIC Blood Culture adequate volume   Culture   Final    NO GROWTH 5 DAYS Performed at [facility], [address], [place], [place] [address]    Report Status 07/10/2018 FINAL  Final  Urine culture     Status: None   Collection Time: 07/05/18  6:44 PM  Result Value Ref Range Status   Specimen Description   Final    URINE, RANDOM Performed at [facility], [address] [place], [place] [address]    Special Requests   Final    NONE Performed at [facility], [address] [place], [place] [address]    Culture   Final    NO GROWTH Performed at [facility], [place] [address] [place], [place] [address]    Report Status 07/07/2018 FINAL  Final  MRSA PCR  Screening     Status: Abnormal   Collection Time: 07/06/18  1:54 AM  Result Value Ref Range Status   MRSA by PCR POSITIVE (A) NEGATIVE Final    Comment:        The GeneXpert MRSA Assay (FDA approved for NASAL specimens only), is one component of a comprehensive MRSA colonization surveillance program. It is not intended to diagnose MRSA infection nor to guide or monitor treatment for MRSA infections. RESULT CALLED TO, READ BACK BY AND VERIFIED WITH: [place] ON 07/06/18 BY SNJ Performed at [facility], [address] [place], [place] [address]   Expectorated sputum assessment w rflx to resp cult     Status: None   Collection Time: 07/07/18  3:23 PM  Result Value Ref Range Status   Specimen Description SPUTUM  Final   Special Requests Immunocompromised  Final   Sputum evaluation   Final    Sputum specimen not acceptable for testing.  Please recollect.   C/MEGHAN OAKLEY AT 1600 07/07/18.PMF Performed at [facility], [address] [place], [place] [address]    Report Status 07/07/2018 FINAL  Final  Expectorated sputum assessment w rflx to resp cult  Status: None   Collection Time: 07/08/18  9:02 AM  Result Value Ref Range Status   Specimen Description SPUTUM  Final   Special Requests NONE  Final   Sputum evaluation   Final    THIS SPECIMEN IS ACCEPTABLE FOR SPUTUM CULTURE Performed at [facility], [address], [place], [place] [address]    Report Status 07/08/2018 FINAL  Final  Culture, respiratory     Status: None (Preliminary result)   Collection Time: 07/08/18  9:02 AM  Result Value Ref Range Status   Specimen Description   Final    SPUTUM Performed at [facility] [place], [address], [place], [place] [address]    Special Requests   Final    NONE Reflexed from [id number] Performed at [facility], [address] [place], [place] [address]    Gram Stain   Final    ABUNDANT WBC PRESENT, PREDOMINANTLY  PMN FEW YEAST RARE GRAM POSITIVE COCCI    Culture   Final    FEW STAPHYLOCOCCUS AUREUS SUSCEPTIBILITIES TO FOLLOW Performed at [facility], [place] [address], [place], [place] [address]    Report Status PENDING  Incomplete    RADIOLOGY:  No results found.   Management plans discussed with the patient, family and they are in agreement.  CODE STATUS: Full Code   TOTAL TIME TAKING CARE OF THIS PATIENT: 35 minutes.    Demetrios Loll M.D on 07/10/2018 at 4:00 PM  Between 7am to 6pm - Pager - [phone]  After 6pm go to [email or web address] - Proofreader  [facility]  Office  [phone]  CC: Primary care physician; [facility]   Note: This dictation was prepared with Dragon dictation along with smaller phrase technology. Any transcriptional errors that result from this process are unintentional.

## 2018-07-10 NOTE — Progress Notes (Addendum)
Pharmacy Antibiotic Note  Thomas [name] is a 67 y.o. male admitted on 07/05/2018 with sepsis.  Pharmacy has been consulted for Cefepime and vancomycin dosing. Now change to PO antibiotic consult   Plan: 12/03 Vanc trough 28. Will adjust vancomycin to 750mg  IV every 18 hours. Predicted trough 15.7. Scr stable.l. Pharmacy will continue to follow and adjust as needed to maintain trough 15 to 20 mcg/ml.   Continue cefepime 2 gm IV Q12H.  New Kinetics: Ke: 0.047   T1/2: 15   Vd: 36.8  12/4- Changing antibiotic to Doxycycline Per tube 100mg  tablet q12h for MRSA coverage per discussion with ID Pharmacist and MD. Current sputum cx= Staph aureus- sensitivity pending. Can crush tablet to give per tube. Separate from Ferrous sulfate and Tube feeds. For total 7 days of abx tx (doxycyline thru 07/12/18)     Height: 5\' 5"  (165.1 cm) Weight: 115 lb 4.8 oz (52.3 kg) IBW/kg (Calculated) : 61.5  Temp (24hrs), Avg:98 F (36.7 C), Min:97.6 F (36.4 C), Max:98.2 F (36.8 C)  Recent Labs  Lab 07/05/18 1844 07/05/18 1849 07/06/18 0620 07/07/18 0707 07/08/18 0611 07/09/18 0917 07/10/18 0454  WBC 19.8*  --  20.3* 19.8* 17.8* 16.1* 13.1*  CREATININE 1.44*  --  1.22 0.83  --  0.72  --   LATICACIDVEN  --  0.99  --   --   --   --   --   VANCOTROUGH  --   --   --   --   --  28*  --     Estimated Creatinine Clearance: 65.4 mL/min (by C-G formula based on SCr of 0.72 mg/dL).    No Known Allergies  Antimicrobials this admission:  11/29 cefepime >> 12/4  11/29 vancomycin  >> 12/4 Doxycyline 12/4 >>  Dose adjustments this admission: 12/3 vancomycin 750mg  IV q12H changed to 750mg  q18hr  Microbiology results: 11/29 BCx: pending  11/29 UCx:  pending 12/2 Resp Cx: GPC- Staph aureus  MRSA PCR: positive   Thank you for allowing pharmacy to be a part of this patient's care.  Noralee Space, PharmD, BCPS Clinical Pharmacist 07/10/2018 10:25 AM

## 2018-07-10 NOTE — Discharge Instructions (Signed)
HHPT, continue home oxygen 3 L. Doxycycline Per tube 100mg  tablet q12h. Can crush tablet to give per tube. Separate from Ferrous sulfate and Tube feeds. For total 7 days of abx tx (doxycyline thru 07/12/18)

## 2018-07-10 NOTE — Care Management (Signed)
Discharge to home today per Dr. Bridgett Larsson. [place], [facility] representative updated. Family will transport. Shelbie Ammons RN MSN CCM Care Management [phone]

## 2018-07-10 NOTE — Care Management Important Message (Signed)
Important Message  Patient Details  Name: Thomas [name] MRN: [id number] Date of Birth: 20-May-1951   Medicare Important Message Given:  Yes    Shelbie Ammons, RN 07/10/2018, 11:19 AM

## 2018-07-10 NOTE — Progress Notes (Signed)
Pt discharged to home in stable condition.  All discharge instructions and Rx's x 6 discussed with and given to pt's daughter. Belongings taken by daughter.  Pt transported off unit via wheelchair with transporters x 1 at chairside.

## 2018-07-10 NOTE — Progress Notes (Signed)
Pharmacy consult note:  Patient on MS Contin 15 mg PO q12h (sustained release). Pharmacy consulted to change to dosing per tube.  Per discussion with MD, will change to Morphine Sulfate Immediate Release solution 10 mg/ 5 ml. Will order 10 mg PO q8h scheduled per Tube.  Chinita [place] PharmD Clinical Pharmacist 07/10/2018

## 2018-07-11 LAB — CULTURE, RESPIRATORY W GRAM STAIN

## 2018-07-11 LAB — CULTURE, RESPIRATORY

## 2018-07-12 ENCOUNTER — Other Ambulatory Visit: Payer: Self-pay | Admitting: *Deleted

## 2018-07-12 ENCOUNTER — Telehealth: Payer: Self-pay | Admitting: *Deleted

## 2018-07-12 NOTE — Telephone Encounter (Signed)
Katie with [facility] called asking If Dr Tasia Catchings will sign orders for SN, Thomas [name], occupational therapy, and Sw for Thomas [name] who was recently discharged form hospital. Please advise

## 2018-07-12 NOTE — Telephone Encounter (Signed)
...     Ref Range & Units 13mo ago  TSH 0.350 - 4.500 uIU/mL 21.474High

## 2018-07-15 NOTE — Telephone Encounter (Signed)
Patient is on the schedule to see Dr Tasia Catchings tomorrow to re-establish care with her.

## 2018-07-16 ENCOUNTER — Inpatient Hospital Stay: Payer: Medicare Other | Admitting: Oncology

## 2018-07-16 ENCOUNTER — Telehealth: Payer: Self-pay | Admitting: *Deleted

## 2018-07-16 ENCOUNTER — Inpatient Hospital Stay: Payer: Medicare Other

## 2018-07-16 NOTE — Telephone Encounter (Signed)
Patient did not show for appt today.  Called and spoke with patients daughter, Thomas [name], she states that pt has been seen at [facility] by his old oncologist and will be receiving care there.

## 2018-07-16 NOTE — Telephone Encounter (Signed)
Patient did not show for appt today.    Called and spoke with patients daughter, Tharon Aquas, she states that pt has been seen at [facility] by his old oncologist and will be receiving care there.

## 2018-09-07 DEATH — deceased

## 2019-08-01 IMAGING — CT CT CHEST W/O CM
2 of 3 series · 15 of 36 positions shown, 18 images · non-contrast
Comparison: Chest radiograph from one day prior. 03/18/2018 PET-CT.

CLINICAL DATA: Inpatient. Worsening cough. History of head neck
cancer.

EXAM:
CT CHEST WITHOUT CONTRAST
TECHNIQUE: Multidetector CT imaging of the chest was performed following the
standard protocol without IV contrast.

[Series 2: thorax · axial · 0.65mm/px · z∈[-290,-2]mm · 12 of 170 slices shown, 15 images]
[im 13/170  mediastinal]
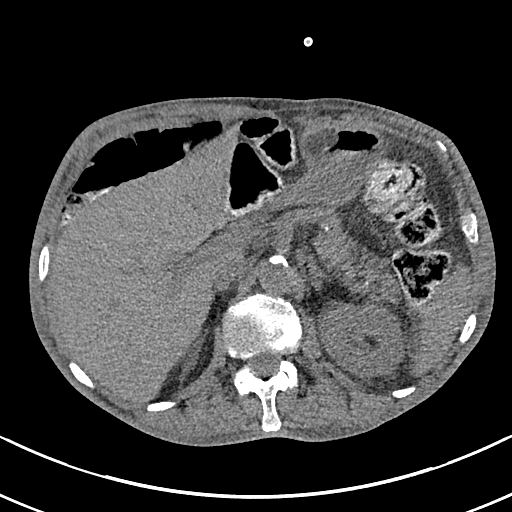
[im 13/170  lung]
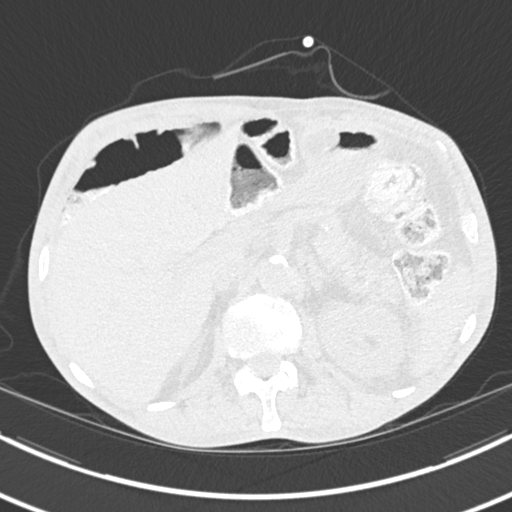
[im 26/170  lung]
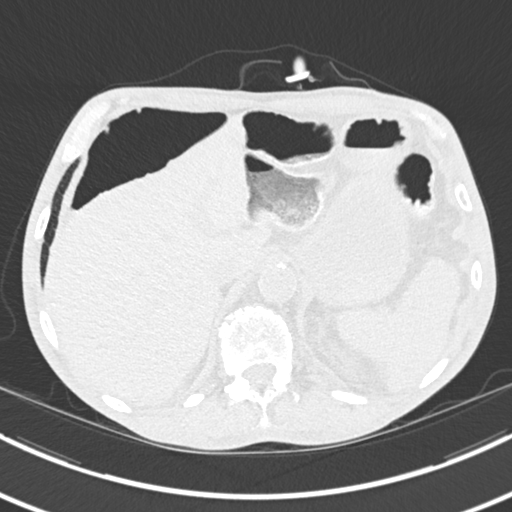
[im 38/170  lung]
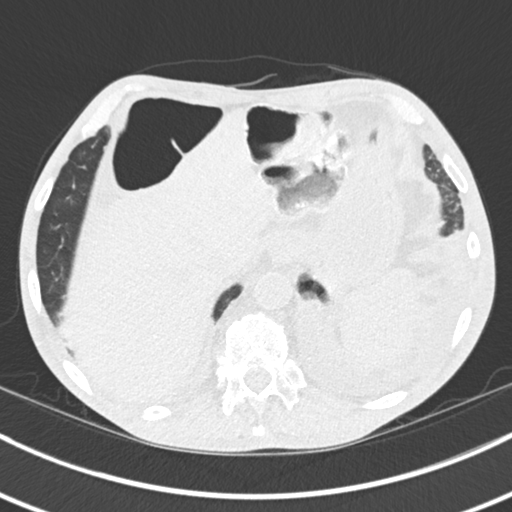
[im 51/170  lung]
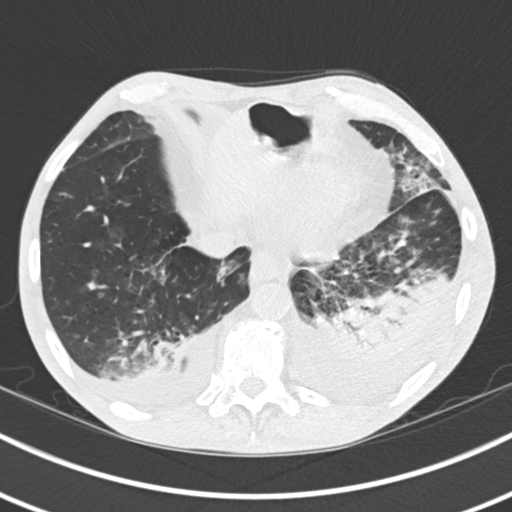
[im 63/170  mediastinal]
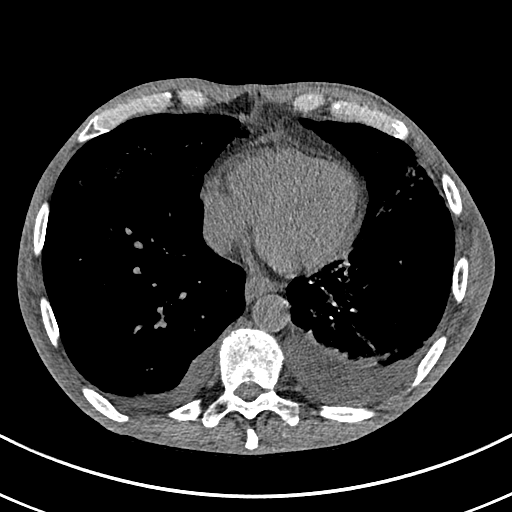
[im 63/170  lung]
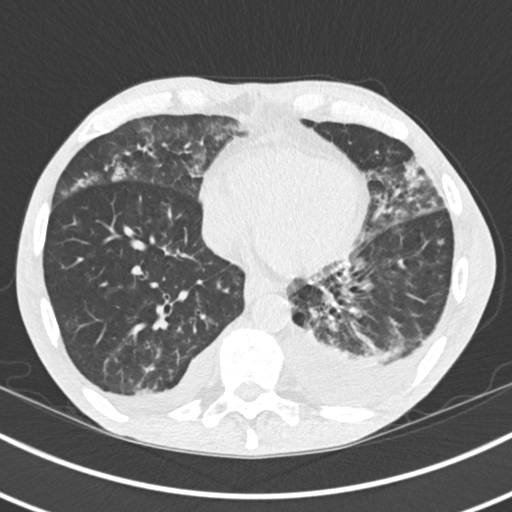
[im 76/170  lung]
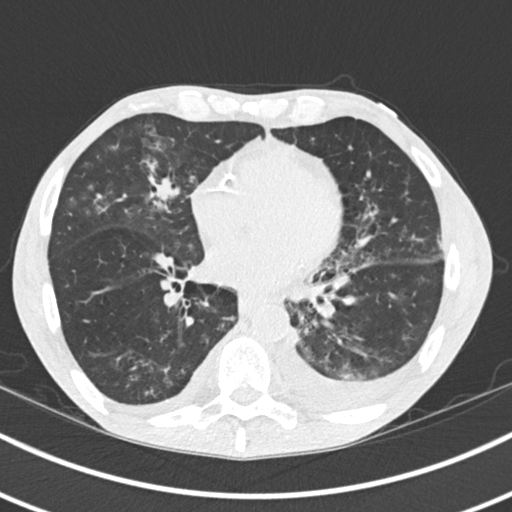
[im 94/170  lung]
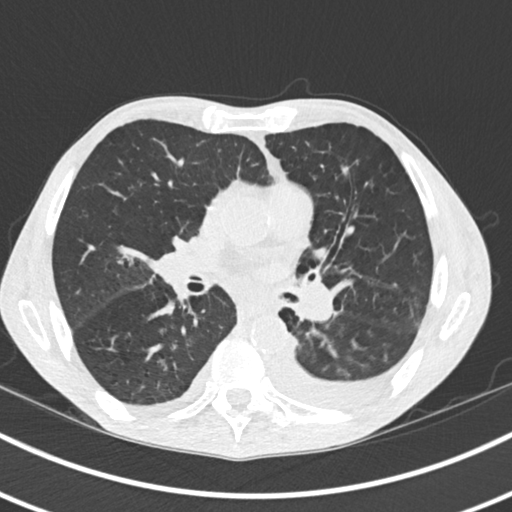
[im 107/170  lung]
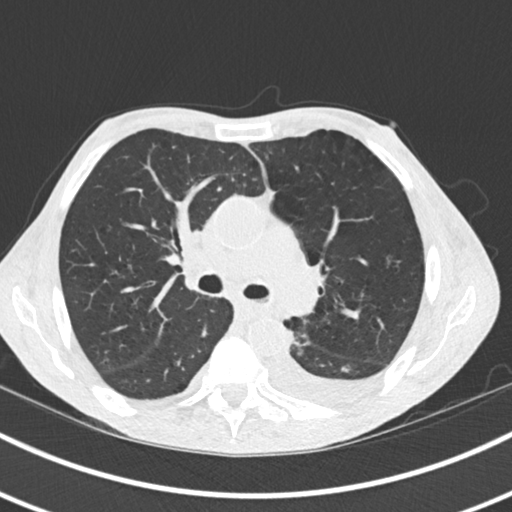
[im 119/170  mediastinal]
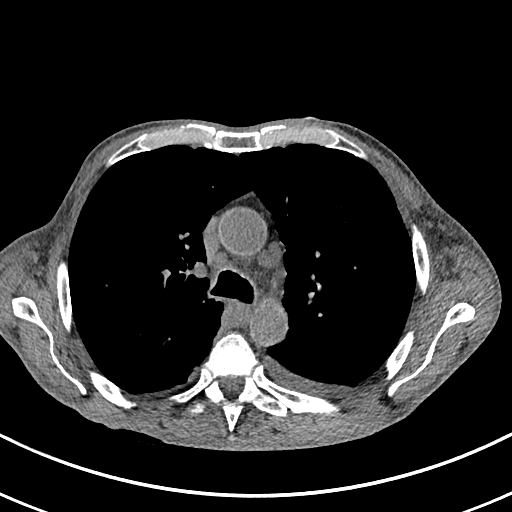
[im 119/170  lung]
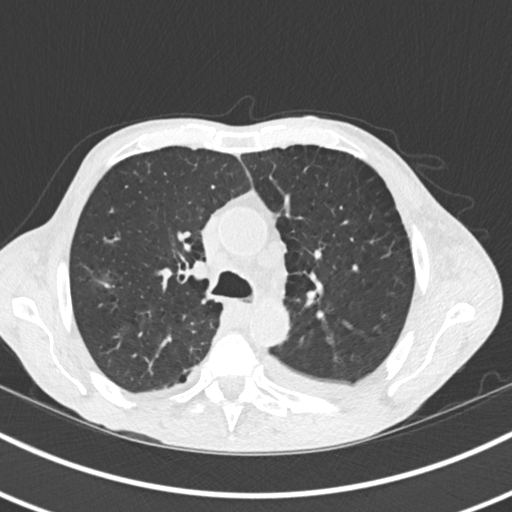
[im 132/170  lung]
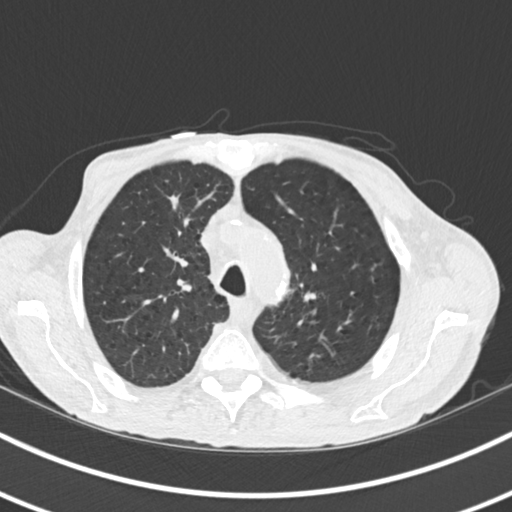
[im 144/170  lung]
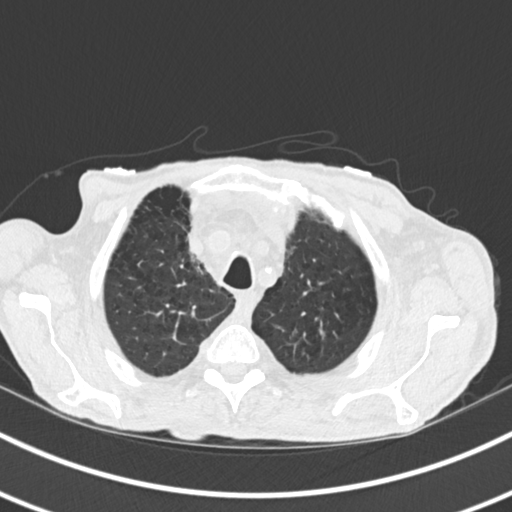
[im 157/170  lung]
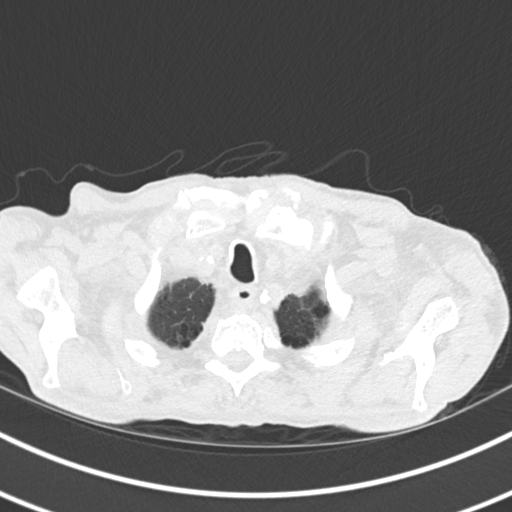

[Series 5: coronal · coronal · 0.71mm/px · 3 of 120 slices shown]
[im 24/120  lung]
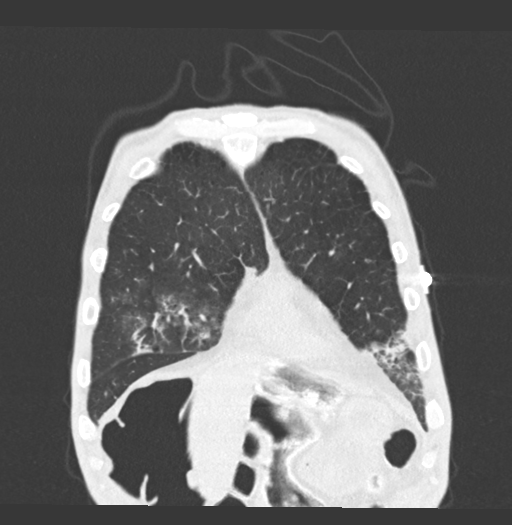
[im 48/120  lung]
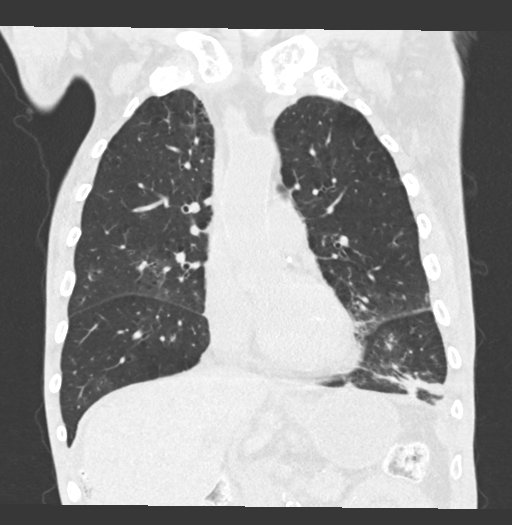
[im 72/120  lung]
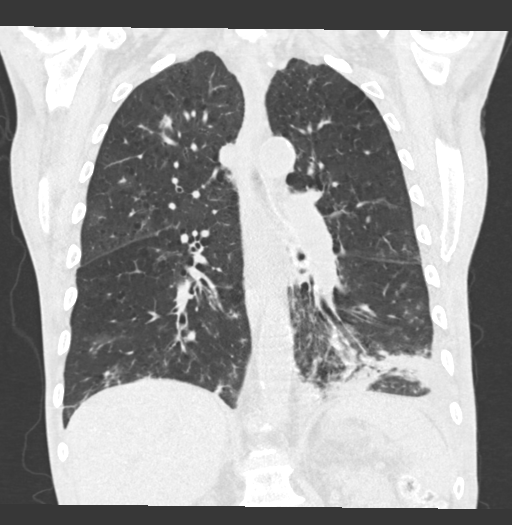

[15 of 36 positions shown; findings below may reference images not displayed]

FINDINGS: Cardiovascular: Normal heart size. No significant pericardial
effusion/thickening. Three-vessel coronary atherosclerosis.
Atherosclerotic nonaneurysmal thoracic aorta. Normal caliber
pulmonary arteries.

Mediastinum/Nodes: No discrete thyroid nodules. Small fluid level in
the upper thoracic esophagus. No pathologically enlarged axillary,
mediastinal or hilar lymph nodes, noting limited sensitivity for the
detection of hilar adenopathy on this noncontrast study.

Lungs/Pleura: No pneumothorax. Small dependent bilateral pleural
effusions, left greater than right. Moderate centrilobular and
paraseptal emphysema with diffuse bronchial wall thickening. There
is moderate consolidation with air bronchograms in the dependent
basilar left lower lobe with some volume loss. There is similar but
less prominent bandlike consolidation in the dependent basilar right
lower lobe with air bronchograms. There is extensive patchy nodular
peribronchovascular consolidation and ground-glass opacity
throughout the bilateral lungs involving all lung lobes, with
relative sparing of the upper lobes. These findings are largely new
since 03/18/2018 PET-CT. Previously described 9 mm nodular focus of
consolidation in the right middle lobe on 03/18/2018 PET-CT is not
discretely visualized on this scan. Previously described 2.0 cm
cavitary nodular focus of consolidation in the anterior right upper
lobe on 03/18/2018 PET-CT is decreased to the 0.6 cm (series 3/image
39) and demonstrates no residual cavitary change. No lung masses.

Upper abdomen: Nonobstructive 7 mm interpolar left renal stone.
Percutaneous gastrostomy tube appears well positioned in the
anterior body of the stomach.

Musculoskeletal: No aggressive appearing focal osseous lesions. Mild
thoracic spondylosis.
IMPRESSION: 1. Extensive patchy nodular peribronchovascular consolidation and
ground-glass opacity throughout both lungs with relative sparing of
the upper lobes. More coalescent consolidation and air bronchograms
at both lung bases, left greater than right. These findings are
largely new since 03/18/2018 PET-CT and are most compatible with
multilobar pneumonia.
2. Small dependent bilateral pleural effusions, left greater than
right.
3. Previously described cavitary right upper lobe pulmonary nodule
from 03/18/2018 PET-CT is decreased. Previously described solid
right middle lobe pulmonary nodule is absent. Presumably, these are
resolving inflammatory nodules.
4. Fluid in the upper thoracic esophagus, suggesting esophageal
dysmotility and/or gastroesophageal reflux.
5. Three-vessel coronary atherosclerosis.
6. Nonobstructing left nephrolithiasis.

Aortic Atherosclerosis (79NVI-XZ6.6) and Emphysema (79NVI-YO5.Y).
